# Patient Record
Sex: Female | Born: 1985 | Race: Black or African American | Hispanic: No | Marital: Single | Smoking: Light tobacco smoker
Health system: Southern US, Community
[De-identification: ages and names within clinical notes are randomized; demographics above are authoritative.]

## PROBLEM LIST (undated history)

## (undated) DIAGNOSIS — G4730 Sleep apnea, unspecified: Secondary | ICD-10-CM

## (undated) DIAGNOSIS — Z8614 Personal history of Methicillin resistant Staphylococcus aureus infection: Secondary | ICD-10-CM

## (undated) DIAGNOSIS — K219 Gastro-esophageal reflux disease without esophagitis: Secondary | ICD-10-CM

## (undated) DIAGNOSIS — N289 Disorder of kidney and ureter, unspecified: Secondary | ICD-10-CM

## (undated) DIAGNOSIS — I499 Cardiac arrhythmia, unspecified: Secondary | ICD-10-CM

## (undated) DIAGNOSIS — R569 Unspecified convulsions: Secondary | ICD-10-CM

## (undated) DIAGNOSIS — F419 Anxiety disorder, unspecified: Secondary | ICD-10-CM

## (undated) DIAGNOSIS — D649 Anemia, unspecified: Secondary | ICD-10-CM

## (undated) DIAGNOSIS — Z9884 Bariatric surgery status: Secondary | ICD-10-CM

## (undated) DIAGNOSIS — I10 Essential (primary) hypertension: Secondary | ICD-10-CM

## (undated) HISTORY — PX: EYE SURGERY: SHX253

## (undated) HISTORY — PX: INTRAUTERINE DEVICE (IUD) INSERTION: SHX5877

## (undated) HISTORY — DX: Cardiac arrhythmia, unspecified: I49.9

## (undated) HISTORY — PX: BACK SURGERY: SHX140

## (undated) HISTORY — DX: Morbid (severe) obesity due to excess calories: E66.01

## (undated) HISTORY — PX: TOE AMPUTATION: SHX809

## (undated) SURGERY — Surgical Case
Anesthesia: *Unknown

---
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
## 2006-01-31 HISTORY — PX: EYE SURGERY: SHX253

## 2010-05-08 ENCOUNTER — Emergency Department (HOSPITAL_COMMUNITY)
Admission: EM | Admit: 2010-05-08 | Discharge: 2010-05-09 | Disposition: A | Discharge status: Home / Self Care | Payer: No Typology Code available for payment source | Attending: Emergency Medicine | Admitting: Emergency Medicine

## 2010-05-08 DIAGNOSIS — M549 Dorsalgia, unspecified: Secondary | ICD-10-CM | POA: Insufficient documentation

## 2010-05-08 DIAGNOSIS — E119 Type 2 diabetes mellitus without complications: Secondary | ICD-10-CM | POA: Insufficient documentation

## 2010-05-08 DIAGNOSIS — I10 Essential (primary) hypertension: Secondary | ICD-10-CM | POA: Insufficient documentation

## 2010-08-16 ENCOUNTER — Emergency Department (HOSPITAL_COMMUNITY): Payer: Medicare Other

## 2010-08-16 ENCOUNTER — Other Ambulatory Visit (HOSPITAL_COMMUNITY): Payer: No Typology Code available for payment source

## 2010-08-16 ENCOUNTER — Inpatient Hospital Stay (HOSPITAL_COMMUNITY): Payer: Medicare Other

## 2010-08-16 ENCOUNTER — Encounter (HOSPITAL_COMMUNITY): Payer: Self-pay

## 2010-08-16 ENCOUNTER — Inpatient Hospital Stay (HOSPITAL_COMMUNITY)
Admission: EM | Admit: 2010-08-16 | Discharge: 2010-08-26 | DRG: 673 | Disposition: A | Discharge status: Home / Self Care | Payer: Medicare Other | Attending: Internal Medicine | Admitting: Internal Medicine

## 2010-08-16 DIAGNOSIS — N2581 Secondary hyperparathyroidism of renal origin: Secondary | ICD-10-CM | POA: Diagnosis present

## 2010-08-16 DIAGNOSIS — L97509 Non-pressure chronic ulcer of other part of unspecified foot with unspecified severity: Secondary | ICD-10-CM | POA: Diagnosis present

## 2010-08-16 DIAGNOSIS — Z6841 Body Mass Index (BMI) 40.0 and over, adult: Secondary | ICD-10-CM

## 2010-08-16 DIAGNOSIS — E1165 Type 2 diabetes mellitus with hyperglycemia: Secondary | ICD-10-CM | POA: Diagnosis present

## 2010-08-16 DIAGNOSIS — D631 Anemia in chronic kidney disease: Secondary | ICD-10-CM | POA: Diagnosis present

## 2010-08-16 DIAGNOSIS — F17200 Nicotine dependence, unspecified, uncomplicated: Secondary | ICD-10-CM | POA: Diagnosis present

## 2010-08-16 DIAGNOSIS — N186 End stage renal disease: Secondary | ICD-10-CM | POA: Diagnosis present

## 2010-08-16 DIAGNOSIS — Z91199 Patient's noncompliance with other medical treatment and regimen due to unspecified reason: Secondary | ICD-10-CM

## 2010-08-16 DIAGNOSIS — I674 Hypertensive encephalopathy: Secondary | ICD-10-CM | POA: Diagnosis present

## 2010-08-16 DIAGNOSIS — I120 Hypertensive chronic kidney disease with stage 5 chronic kidney disease or end stage renal disease: Principal | ICD-10-CM | POA: Diagnosis present

## 2010-08-16 DIAGNOSIS — E875 Hyperkalemia: Secondary | ICD-10-CM | POA: Diagnosis present

## 2010-08-16 DIAGNOSIS — Z9119 Patient's noncompliance with other medical treatment and regimen: Secondary | ICD-10-CM

## 2010-08-16 DIAGNOSIS — N179 Acute kidney failure, unspecified: Secondary | ICD-10-CM | POA: Diagnosis present

## 2010-08-16 HISTORY — DX: Essential (primary) hypertension: I10

## 2010-08-16 HISTORY — DX: Disorder of kidney and ureter, unspecified: N28.9

## 2010-08-16 LAB — COMPREHENSIVE METABOLIC PANEL
ALT: 7 U/L (ref 0–35)
AST: 18 U/L (ref 0–37)
Albumin: 2.5 g/dL — ABNORMAL LOW (ref 3.5–5.2)
CO2: 19 mEq/L (ref 19–32)
Calcium: 8.5 mg/dL (ref 8.4–10.5)
Chloride: 102 mEq/L (ref 96–112)
GFR calc non Af Amer: 4 mL/min — ABNORMAL LOW (ref >60)
Sodium: 135 mEq/L (ref 135–145)

## 2010-08-16 LAB — CBC
HCT: 27.9 % — ABNORMAL LOW (ref 36.0–46.0)
MCH: 25.5 pg — ABNORMAL LOW (ref 26.0–34.0)
MCHC: 33 g/dL (ref 30.0–36.0)
MCV: 77.3 fL — ABNORMAL LOW (ref 78.0–100.0)
RDW: 15.8 % — ABNORMAL HIGH (ref 11.5–15.5)

## 2010-08-16 LAB — PHOSPHORUS: Phosphorus: 9.9 mg/dL — ABNORMAL HIGH (ref 2.3–4.6)

## 2010-08-16 LAB — GLUCOSE, CAPILLARY: Glucose-Capillary: 105 mg/dL — ABNORMAL HIGH (ref 70–99)

## 2010-08-16 LAB — URINE MICROSCOPIC-ADD ON

## 2010-08-16 LAB — URINALYSIS, ROUTINE W REFLEX MICROSCOPIC
Bilirubin Urine: NEGATIVE
Protein, ur: >300 mg/dL — AB
Urobilinogen, UA: 0.2 mg/dL (ref 0.0–1.0)

## 2010-08-16 LAB — DIFFERENTIAL
Eosinophils Relative: 11 % — ABNORMAL HIGH (ref 0–5)
Lymphocytes Relative: 15 % (ref 12–46)
Lymphs Abs: 1.5 10*3/uL (ref 0.7–4.0)
Monocytes Absolute: 0.6 10*3/uL (ref 0.1–1.0)

## 2010-08-16 LAB — CARDIAC PANEL(CRET KIN+CKTOT+MB+TROPI)
CK, MB: 9.6 ng/mL (ref 0.3–4.0)
Total CK: 286 U/L — ABNORMAL HIGH (ref 7–177)

## 2010-08-17 ENCOUNTER — Inpatient Hospital Stay (HOSPITAL_COMMUNITY): Payer: Medicare Other

## 2010-08-17 DIAGNOSIS — Z01810 Encounter for preprocedural cardiovascular examination: Secondary | ICD-10-CM

## 2010-08-17 DIAGNOSIS — N19 Unspecified kidney failure: Secondary | ICD-10-CM

## 2010-08-17 LAB — PLATELET FUNCTION ASSAY
Collagen / ADP: 211 seconds (ref 0–108)
Collagen / Epinephrine: >300 seconds (ref 0–197)

## 2010-08-17 LAB — HEMOGLOBIN A1C: Mean Plasma Glucose: 134 mg/dL — ABNORMAL HIGH (ref <117)

## 2010-08-17 LAB — IRON AND TIBC
Saturation Ratios: 20 % (ref 20–55)
UIBC: 178 ug/dL

## 2010-08-17 LAB — BASIC METABOLIC PANEL
BUN: 73 mg/dL — ABNORMAL HIGH (ref 6–23)
CO2: 17 mEq/L — ABNORMAL LOW (ref 19–32)
Chloride: 103 mEq/L (ref 96–112)
Creatinine, Ser: 12.55 mg/dL — ABNORMAL HIGH (ref 0.50–1.10)
Glucose, Bld: 118 mg/dL — ABNORMAL HIGH (ref 70–99)

## 2010-08-17 LAB — URINALYSIS, MICROSCOPIC ONLY
Bilirubin Urine: NEGATIVE
Glucose, UA: 100 mg/dL — AB
Ketones, ur: NEGATIVE mg/dL
Nitrite: NEGATIVE
Specific Gravity, Urine: 1.016 (ref 1.005–1.030)
pH: 6 (ref 5.0–8.0)

## 2010-08-17 LAB — MRSA PCR SCREENING: MRSA by PCR: NEGATIVE

## 2010-08-17 LAB — RAPID URINE DRUG SCREEN, HOSP PERFORMED
Barbiturates: NOT DETECTED
Cocaine: NOT DETECTED
Tetrahydrocannabinol: NOT DETECTED

## 2010-08-17 LAB — LIPID PANEL
LDL Cholesterol: 78 mg/dL (ref 0–99)
Total CHOL/HDL Ratio: 3.6 RATIO
VLDL: 18 mg/dL (ref 0–40)

## 2010-08-17 LAB — CBC
HCT: 27.1 % — ABNORMAL LOW (ref 36.0–46.0)
Hemoglobin: 9 g/dL — ABNORMAL LOW (ref 12.0–15.0)
MCV: 77.4 fL — ABNORMAL LOW (ref 78.0–100.0)
RBC: 3.5 MIL/uL — ABNORMAL LOW (ref 3.87–5.11)
RDW: 15.7 % — ABNORMAL HIGH (ref 11.5–15.5)
WBC: 8.9 10*3/uL (ref 4.0–10.5)

## 2010-08-17 LAB — APTT: aPTT: 29 seconds (ref 24–37)

## 2010-08-17 LAB — PROTIME-INR
INR: 1.25 (ref 0.00–1.49)
Prothrombin Time: 16 seconds — ABNORMAL HIGH (ref 11.6–15.2)

## 2010-08-17 LAB — CARDIAC PANEL(CRET KIN+CKTOT+MB+TROPI)
Relative Index: 3.6 — ABNORMAL HIGH (ref 0.0–2.5)
Total CK: 264 U/L — ABNORMAL HIGH (ref 7–177)
Total CK: 220 U/L — ABNORMAL HIGH (ref 7–177)

## 2010-08-17 LAB — TSH: TSH: 4.62 u[IU]/mL — ABNORMAL HIGH (ref 0.350–4.500)

## 2010-08-17 LAB — FERRITIN: Ferritin: 40 ng/mL (ref 10–291)

## 2010-08-17 LAB — GLUCOSE, CAPILLARY
Glucose-Capillary: 149 mg/dL — ABNORMAL HIGH (ref 70–99)
Glucose-Capillary: 88 mg/dL (ref 70–99)

## 2010-08-17 LAB — HEPATITIS B SURFACE ANTIGEN: Hepatitis B Surface Ag: NEGATIVE

## 2010-08-17 LAB — PROTEIN / CREATININE RATIO, URINE: Protein Creatinine Ratio: 6.56 — ABNORMAL HIGH (ref 0.00–0.15)

## 2010-08-18 ENCOUNTER — Inpatient Hospital Stay (HOSPITAL_COMMUNITY): Payer: Medicare Other

## 2010-08-18 LAB — BASIC METABOLIC PANEL
BUN: 58 mg/dL — ABNORMAL HIGH (ref 6–23)
CO2: 20 mEq/L (ref 19–32)
Calcium: 7.8 mg/dL — ABNORMAL LOW (ref 8.4–10.5)
Chloride: 104 mEq/L (ref 96–112)
Creatinine, Ser: 11.07 mg/dL — ABNORMAL HIGH (ref 0.50–1.10)

## 2010-08-18 LAB — GLUCOSE, CAPILLARY: Glucose-Capillary: 139 mg/dL — ABNORMAL HIGH (ref 70–99)

## 2010-08-18 LAB — PTH, INTACT AND CALCIUM
Calcium, Total (PTH): 8.2 mg/dL — ABNORMAL LOW (ref 8.4–10.5)
PTH: 293.6 pg/mL — ABNORMAL HIGH (ref 14.0–72.0)

## 2010-08-18 LAB — CBC
HCT: 25 % — ABNORMAL LOW (ref 36.0–46.0)
MCH: 25.6 pg — ABNORMAL LOW (ref 26.0–34.0)
MCV: 78.1 fL (ref 78.0–100.0)
RBC: 3.2 MIL/uL — ABNORMAL LOW (ref 3.87–5.11)
RDW: 16 % — ABNORMAL HIGH (ref 11.5–15.5)
WBC: 9.2 10*3/uL (ref 4.0–10.5)

## 2010-08-18 LAB — HIV-1 RNA QUANT-NO REFLEX-BLD
HIV 1 RNA Quant: <20 copies/mL (ref <20)
HIV-1 RNA Quant, Log: <1.3 {Log} (ref <1.30)

## 2010-08-19 ENCOUNTER — Inpatient Hospital Stay (HOSPITAL_COMMUNITY): Payer: Medicare Other

## 2010-08-19 LAB — GLUCOSE, CAPILLARY
Glucose-Capillary: 119 mg/dL — ABNORMAL HIGH (ref 70–99)
Glucose-Capillary: 92 mg/dL (ref 70–99)

## 2010-08-19 LAB — BASIC METABOLIC PANEL
Calcium: 7.8 mg/dL — ABNORMAL LOW (ref 8.4–10.5)
GFR calc non Af Amer: 5 mL/min — ABNORMAL LOW (ref >60)
Glucose, Bld: 119 mg/dL — ABNORMAL HIGH (ref 70–99)
Sodium: 139 mEq/L (ref 135–145)

## 2010-08-19 LAB — CBC
HCT: 22.9 % — ABNORMAL LOW (ref 36.0–46.0)
MCHC: 32.8 g/dL (ref 30.0–36.0)
RDW: 16.5 % — ABNORMAL HIGH (ref 11.5–15.5)

## 2010-08-20 ENCOUNTER — Other Ambulatory Visit: Payer: Self-pay | Admitting: Interventional Radiology

## 2010-08-20 ENCOUNTER — Inpatient Hospital Stay (HOSPITAL_COMMUNITY): Payer: Medicare Other

## 2010-08-20 LAB — CBC
MCH: 26.3 pg (ref 26.0–34.0)
MCHC: 32.9 g/dL (ref 30.0–36.0)
Platelets: 159 10*3/uL (ref 150–400)
RBC: 3.16 MIL/uL — ABNORMAL LOW (ref 3.87–5.11)
RDW: 16.7 % — ABNORMAL HIGH (ref 11.5–15.5)

## 2010-08-20 LAB — GLUCOSE, CAPILLARY
Glucose-Capillary: 120 mg/dL — ABNORMAL HIGH (ref 70–99)
Glucose-Capillary: 106 mg/dL — ABNORMAL HIGH (ref 70–99)
Glucose-Capillary: 140 mg/dL — ABNORMAL HIGH (ref 70–99)

## 2010-08-20 LAB — COMPREHENSIVE METABOLIC PANEL
AST: 12 U/L (ref 0–37)
Albumin: 2.2 g/dL — ABNORMAL LOW (ref 3.5–5.2)
Calcium: 8.1 mg/dL — ABNORMAL LOW (ref 8.4–10.5)
Creatinine, Ser: 8.09 mg/dL — ABNORMAL HIGH (ref 0.50–1.10)

## 2010-08-20 LAB — ABO/RH: ABO/RH(D): O POS

## 2010-08-20 LAB — GLOMERULAR BASEMENT MEMBRANE ANTIBODIES

## 2010-08-20 LAB — PROTIME-INR
INR: 1.22 (ref 0.00–1.49)
Prothrombin Time: 15.7 seconds — ABNORMAL HIGH (ref 11.6–15.2)

## 2010-08-20 LAB — TYPE AND SCREEN
ABO/RH(D): O POS
Antibody Screen: NEGATIVE

## 2010-08-21 LAB — RENAL FUNCTION PANEL
Albumin: 2.2 g/dL — ABNORMAL LOW (ref 3.5–5.2)
BUN: 34 mg/dL — ABNORMAL HIGH (ref 6–23)
Chloride: 103 mEq/L (ref 96–112)
GFR calc Af Amer: 6 mL/min — ABNORMAL LOW (ref >60)
Potassium: 4.6 mEq/L (ref 3.5–5.1)
Sodium: 137 mEq/L (ref 135–145)

## 2010-08-21 LAB — CBC
HCT: 25 % — ABNORMAL LOW (ref 36.0–46.0)
Hemoglobin: 8.1 g/dL — ABNORMAL LOW (ref 12.0–15.0)
RDW: 17 % — ABNORMAL HIGH (ref 11.5–15.5)
WBC: 12.1 10*3/uL — ABNORMAL HIGH (ref 4.0–10.5)

## 2010-08-21 LAB — GLUCOSE, CAPILLARY: Glucose-Capillary: 101 mg/dL — ABNORMAL HIGH (ref 70–99)

## 2010-08-22 ENCOUNTER — Inpatient Hospital Stay (HOSPITAL_COMMUNITY): Payer: Medicare Other

## 2010-08-22 LAB — RENAL FUNCTION PANEL
BUN: 37 mg/dL — ABNORMAL HIGH (ref 6–23)
CO2: 23 mEq/L (ref 19–32)
Calcium: 8.7 mg/dL (ref 8.4–10.5)
Chloride: 99 mEq/L (ref 96–112)
Creatinine, Ser: 10.29 mg/dL — ABNORMAL HIGH (ref 0.50–1.10)
Glucose, Bld: 117 mg/dL — ABNORMAL HIGH (ref 70–99)

## 2010-08-22 LAB — PLATELET ANTIBODIES, DIRECT: Platelet IgG Ab, Direct: NEGATIVE

## 2010-08-22 LAB — CBC
HCT: 24.1 % — ABNORMAL LOW (ref 36.0–46.0)
MCH: 25.9 pg — ABNORMAL LOW (ref 26.0–34.0)
MCV: 81.1 fL (ref 78.0–100.0)
RBC: 2.97 MIL/uL — ABNORMAL LOW (ref 3.87–5.11)
WBC: 14.5 10*3/uL — ABNORMAL HIGH (ref 4.0–10.5)

## 2010-08-23 ENCOUNTER — Inpatient Hospital Stay (HOSPITAL_COMMUNITY): Payer: Medicare Other

## 2010-08-23 LAB — RENAL FUNCTION PANEL
CO2: 27 mEq/L (ref 19–32)
Calcium: 8.4 mg/dL (ref 8.4–10.5)
GFR calc Af Amer: 8 mL/min — ABNORMAL LOW (ref >60)
GFR calc non Af Amer: 7 mL/min — ABNORMAL LOW (ref >60)
Potassium: 4.4 mEq/L (ref 3.5–5.1)
Sodium: 136 mEq/L (ref 135–145)

## 2010-08-23 LAB — CBC
MCH: 26.3 pg (ref 26.0–34.0)
Platelets: 165 10*3/uL (ref 150–400)
RBC: 2.97 MIL/uL — ABNORMAL LOW (ref 3.87–5.11)
WBC: 11.1 10*3/uL — ABNORMAL HIGH (ref 4.0–10.5)

## 2010-08-23 LAB — GLUCOSE, CAPILLARY
Glucose-Capillary: 129 mg/dL — ABNORMAL HIGH (ref 70–99)
Glucose-Capillary: 103 mg/dL — ABNORMAL HIGH (ref 70–99)

## 2010-08-23 LAB — ANTI-NEUTROPHIL ANTIBODY

## 2010-08-23 NOTE — Consult Note (Signed)
[name], [name]             ACCOUNT NO.:  [id number]  MEDICAL RECORD NO.:  [id number]  LOCATION:  [id number]                         FACILITY:  [facility]  PHYSICIAN:  Maudie Flakes. Hassell Done, M.D.   DATE OF BIRTH:  08-07-1985  DATE OF CONSULTATION: DATE OF DISCHARGE:                                CONSULTATION   PRIMARY CARE PHYSICIAN:  None, was seen at [facility] in the past.  CHIEF COMPLAINT:  Severe headache.  HISTORY OF PRESENT ILLNESS:  This is a 25 year old black woman with  past medical history of diabetes, hypertension, and asthma who presents with sharp pain behind her right eye.  The patient has not seen a PCP in over a year and does not take any meds.  She has been seen at [facility] for DM, high BP, and eye surgery and was started on insulin and hypertensive meds.  The patient stopped taking them over a year ago. She says she could not afford it.  Headache started last night around 10 p.m. She was sitting down when she felt "hot" sensation in both eyes and then her headache started suddenly.  The headache is located at the right forehead and behind right eye, radiates to the right side of her neck. Current pain is 9/10.  Associated symptoms of neck pain with range of motion, blurry vision for the past 3-4 weeks, nausea, vomiting x3 months, and shortness of breath with exertion.  She denies chest pain, abdominal pain, dysuria, numbness, tingling of extremities.  ED COURSE:  She received Zofran IV, morphine 4 mg x2, metoprolol 5 mg, and labetalol 10 IV.  PAST MEDICAL HISTORY: 1. Diabetes type 2. 2. Hypertension. 3. Irregular heart beat. 4. Asthma.  PAST SURGICAL HISTORY:  Laser photocoagulation plus questionable vitrectomy.  ALLERGIES:  No known drug allergies, LASIX.  MEDICATIONS:  None currently.  SOCIAL HISTORY:  She lives in apartment with 2 roommates.  Occupation: on disability.  She is a Programmer, systems.  She smokes about a half- a-pack per day  for over 10 years.  Denies any alcohol or illicit drug use. She thinks she'll live with her parents in [place] on d/c from hospital  FAMILY HISTORY:  Mother has a diabetes, hypertension, and thyroid disease.  Father has thyroid disease and hypertension. Siblings; brother has hypertension, sister is healthy.  REVIEW OF SYSTEMS:  Positive for headache, edema, cough, dyspnea, nausea, vomiting, diarrhea, and visual changes.  Negative for fever, chills, sweats, chest pain, edema, abdominal pain, polyuria, dysuria.  PHYSICAL EXAMINATION:  VITAL SIGNS:  Temperature 98, pulse 82, respirations 20, blood pressure 205/142, pulse ox 100% on room air, weight 129 kilos. GENERAL:  Pleasant, flat affect, in no acute distress. HEENT:  /AT.  Extraocular muscles intact.  Eye exam deferred, will return with ophthalmoscope in the morning. NECK:  Full range of motion.  No JVD or thyromegaly, tenderness on palpation of paraspinal muscles. CARDIOVASCULAR:  Regular rate and rhythm.  No murmur appreciated. LUNGS:  Clear to auscultation bilaterally.  No wheezes, rales, or rhonchi. ABDOMEN:  Soft, obese, nontender, normoactive bowel sounds. EXTREMITIES:  3+ nonpitting pedal edema.  Left big toe diabetic ulcer. NEUROLOGIC:  Alert, awake, oriented  x3.  No focal neuro deficits. MUSCULOSKELETAL:  2+ intact pulses distal bilaterally.  LABS AND STUDIES:  CBC; white count 10.3, hemoglobin 9.2, hematocrit 27.9, platelets 268, neutrophils 69%.  CMET; sodium 135, potassium 5.2, chloride 102, CO2 19, BUN 70, creatinine 12.02, glucose 112.  AST 18, ALT 7, total bili 0.2, calcium 8.5, albumin 2.5.  Urinalysis, specific gravity 1.016, urine glucose 100, large blood, protein over 300, negative nitrites, small leukocytes.  Urine micro, few epi, granular casts, white blood cells 3-6, red blood cells 11-20, few bacteria. Urine pregnancy negative.    CT head negative.  ASSESSMENT AND PLAN:  This is a 25 year old black  woman who hasn't taken meds for DM and high BP in > 81yr.  She is admitted for headache and elevated blood pressure, now found to have acute kidney injury with a creatinine of 12.0.  1. Renal acute kidney injury, likely secondary to uncontrolled     diabetes and hypertension.  The patient may also be volume depleated due     vomiting.  We will get a renal ultrasound to rule     out hydro/obstruction.  Urine drug screen,       ANA, C3, C4, PTH, urine protein/creatinine ratio,     and HIV reflex.  We will also get a hepatitis B surface     antigen for possible dialysis.[name] Kitchen 2. Cardiovascular.  Blood pressure currently 205/142.  We will     continue with labetalol 10 mg every 2 hours of parameters.  Then     blood pressure better controlled, we may switch to amlodipine. 3. Diabetes type 2.  Blood glucose currently 112.  The patient may     need sliding scale insulin if CBGs trend up.  We will defer to     primary team. 4. Fluids, electrolytes, and nutrition/gastrointestinal.  Start a carb-     modified, low-sodium, low phosphorus, low poatssium diet. 5. Social.  Primary team is to get social work consult regarding     her financial assistance for meds and to establish PCP. 6. Anemia.  Hemoglobin is 9.2, likely secondary to chronic kidney     disease.  We will get iron studies and consider adding Feraheme or     Aranesp.    ______________________________ Donnamarie Rossetti, MD   ______________________________ Maudie Flakes. Hassell Done, M.D.    ID/MEDQ  D:  08/18/2010  T:  08/19/2010  Job:  [id number]  Electronically Signed by Donnamarie Rossetti MD on 08/22/2010 09:41:18 AM Electronically Signed by Salem Senate M.D. on 08/23/2010 11:57:10 AM

## 2010-08-24 ENCOUNTER — Inpatient Hospital Stay (HOSPITAL_COMMUNITY): Payer: Medicare Other

## 2010-08-24 DIAGNOSIS — I120 Hypertensive chronic kidney disease with stage 5 chronic kidney disease or end stage renal disease: Secondary | ICD-10-CM

## 2010-08-24 DIAGNOSIS — N186 End stage renal disease: Secondary | ICD-10-CM

## 2010-08-24 LAB — RENAL FUNCTION PANEL
CO2: 32 mEq/L (ref 19–32)
GFR calc Af Amer: 12 mL/min — ABNORMAL LOW (ref >60)
Glucose, Bld: 97 mg/dL (ref 70–99)
Potassium: 4.3 mEq/L (ref 3.5–5.1)
Sodium: 138 mEq/L (ref 135–145)

## 2010-08-24 LAB — PTH, INTACT AND CALCIUM
Calcium, Total (PTH): 8.1 mg/dL — ABNORMAL LOW (ref 8.4–10.5)
PTH: 245.2 pg/mL — ABNORMAL HIGH (ref 14.0–72.0)

## 2010-08-24 LAB — CBC
Hemoglobin: 8.3 g/dL — ABNORMAL LOW (ref 12.0–15.0)
MCH: 25.6 pg — ABNORMAL LOW (ref 26.0–34.0)
RBC: 3.24 MIL/uL — ABNORMAL LOW (ref 3.87–5.11)

## 2010-08-24 LAB — GLUCOSE, CAPILLARY
Glucose-Capillary: 107 mg/dL — ABNORMAL HIGH (ref 70–99)
Glucose-Capillary: 99 mg/dL (ref 70–99)

## 2010-08-25 DIAGNOSIS — N186 End stage renal disease: Secondary | ICD-10-CM

## 2010-08-25 DIAGNOSIS — I120 Hypertensive chronic kidney disease with stage 5 chronic kidney disease or end stage renal disease: Secondary | ICD-10-CM

## 2010-08-25 LAB — GLUCOSE, CAPILLARY: Glucose-Capillary: 113 mg/dL — ABNORMAL HIGH (ref 70–99)

## 2010-08-26 ENCOUNTER — Inpatient Hospital Stay (HOSPITAL_COMMUNITY): Payer: Medicare Other

## 2010-08-26 DIAGNOSIS — N186 End stage renal disease: Secondary | ICD-10-CM

## 2010-08-26 DIAGNOSIS — I120 Hypertensive chronic kidney disease with stage 5 chronic kidney disease or end stage renal disease: Secondary | ICD-10-CM

## 2010-08-26 HISTORY — PX: AV FISTULA PLACEMENT: SHX1204

## 2010-08-26 LAB — BASIC METABOLIC PANEL
GFR calc Af Amer: 8 mL/min — ABNORMAL LOW (ref >60)
GFR calc non Af Amer: 7 mL/min — ABNORMAL LOW (ref >60)
Potassium: 3.9 mEq/L (ref 3.5–5.1)
Sodium: 135 mEq/L (ref 135–145)

## 2010-08-26 LAB — SURGICAL PCR SCREEN
MRSA, PCR: NEGATIVE
Staphylococcus aureus: NEGATIVE

## 2010-08-26 LAB — CBC
Platelets: 185 10*3/uL (ref 150–400)
RBC: 3.25 MIL/uL — ABNORMAL LOW (ref 3.87–5.11)
WBC: 10.9 10*3/uL — ABNORMAL HIGH (ref 4.0–10.5)

## 2010-08-26 LAB — GLUCOSE, CAPILLARY
Glucose-Capillary: 121 mg/dL — ABNORMAL HIGH (ref 70–99)
Glucose-Capillary: 116 mg/dL — ABNORMAL HIGH (ref 70–99)

## 2010-08-27 NOTE — Discharge Summary (Signed)
[name], [name]             ACCOUNT NO.:  [id number]  MEDICAL RECORD NO.:  [id number]  LOCATION:                                 FACILITY:  PHYSICIAN:  Jacolyn Reedy, M.D.   DATE OF BIRTH:  [birth year]  DATE OF ADMISSION:  08/16/2010 DATE OF DISCHARGE:  08/26/2010                              DISCHARGE SUMMARY   ADDENDUM  DISCHARGE DIAGNOSES: 1. End-stage renal disease on hemodialysis. 2. Hypertensive emergency, resolved, now with labile blood pressures. 3. Type 2 diabetes on very low dose of 70/30. 4. Morbid obesity. 5. Anemia of chronic disease. 6. Secondary hyperparathyroidism. 7. Remote history of asthma.  DISCHARGE MEDICATIONS: 1. Amlodipine 10 mg by mouth once a day. 2. Aspirin 81 mg by mouth once a day. 3. Aranesp 200 mcg on Thursdays at dialysis. 4. Ferrous gluconate 125 mg Tuesdays, Thursdays, and Saturdays at     dialysis. 5. NovoLog 70/30 four units subcu with dinner, but hold if the sugar     is less than 120. 6. NovoLog 70/30 six units with breakfast, but hold if the sugar is     less than 120. 7. Labetalol 200 mg by mouth twice a day. 8. Zemplar 3 mcg IV Tuesdays, Thursdays, and Fridays with dialysis. 9. Promethazine 12.5 mg every 6 hours as needed for nausea. 10.Nephro-Vite 1 tablet by mouth at bedtime. 11.Renvela 1600 mg by mouth 3 times a day with meals.  HOSPITAL COURSE: 1. For details of the hospitalization up to August 24, 2010, please     refer to dictation by Domenic Polite, but after August 24, 2010, she     has had a long-term access placed by Vascular Surgery.  She is     going to have dialysis on Tuesdays, Thursdays, and Saturdays.  Her     blood pressures remained adequately controlled on 10 mg of     amlodipine and 200 mg b.i.d. of labetalol.  She has had lows in the     low 120s, but highs that go up to 190.  However, with dialysis,     hopefully, this should continue to improve, so again we will need     to follow this up at [facility]. 2. End-stage renal disease, now she is getting dialyzed Tuesdays,     Thursdays, and Saturdays.  Renal assistance during this     hospitalization was greatly appreciated. 3. Diabetes.  Her hemoglobin A1c is 6.7.  She is refusing to take     pills.  We will put her on a very low dose of 70/30 with generous     hold criteria for sugar less than 120, she should take it and she     will need to follow up with [facility] for this.  She has refused     to take any oral medications and again, she will need to take     metformin because of her kidney disease. 4. Anemia of chronic disease.  We will continue with IV iron at     dialysis.  DISCHARGE LABS AND VITALS:  Temperature 98.3, heart rate 95, respiratory rate 20, blood pressure 143/75.  Sodium 135, potassium 3.9, chloride  98, bicarb 26, BUN 16, creatinine 7, glucose 101, calcium 8.8.  MRSA screen negative.  White count 11, hemoglobin 8.3, hematocrit 27, platelet 295.  Approximate length of this discharge was approximately 30 minutes.     Jacolyn Reedy, M.D.     JC/MEDQ  D:  08/26/2010  T:  08/26/2010  Job:  [id number]  cc:   [facility]  Electronically Signed by Jacolyn Reedy M.D. on 08/27/2010 01:58:57 PM

## 2010-08-27 NOTE — Op Note (Signed)
  [name], [name]             ACCOUNT NO.:  [id number]  MEDICAL RECORD NO.:  [id number]  LOCATION:  6706                         FACILITY:  [place]  PHYSICIAN:  Judeth Cornfield. Scot Dock, M.D.DATE OF BIRTH:  05/16/1985  DATE OF PROCEDURE: DATE OF DISCHARGE:  08/26/2010                              OPERATIVE REPORT   PREOPERATIVE DIAGNOSIS:  Chronic kidney disease.  POSTOPERATIVE DIAGNOSIS:  Chronic kidney disease.  PROCEDURE:  Placement of a left radiocephalic AV fistula.  SURGEON:  Judeth Cornfield. Scot Dock, MD  ASSISTANT:  Evorn Gong, PA  ANESTHESIA:  General.  TECHNIQUE:  The patient was taken to the operating room and received a general anesthetic.  The left upper extremity was prepped and draped in usual sterile fashion.  An oblique incision was made in the left wrist and the cephalic vein was dissected free and ligated distally and irrigated up with heparinized saline, it was about a 3.5-mm vein.  The radial artery was dissected free beneath the fascia.  The artery was small.  The patient was heparinized.  The artery was clamped proximally and distally and a longitudinal arteriotomy was made.  The vein was mobilized over, spatulated and sewn end-to-side to the artery using continuous 6-0 Prolene suture.  At the completion, there was a good thrill in the fistula and a good radial and ulnar signal with the Doppler.  Hemostasis was obtained in the wound.  The heparin was partially reversed with protamine.  The wound was closed with a deep layer of 3-0 Vicryl and the skin closed with 4-0 Vicryl.  Sterile dressing was applied.  The patient tolerated the procedure well, was transferred to the recovery room in stable condition.  All needle and sponge counts were correct.     Judeth Cornfield. Scot Dock, M.D.     CSD/MEDQ  D:  08/26/2010  T:  08/26/2010  Job:  [id number]  Electronically Signed by Deitra Mayo M.D. on 08/27/2010 03:45:56 PM

## 2010-09-02 NOTE — Discharge Summary (Signed)
  NAMESKYLEA, [name]             ACCOUNT NO.:  [id number]  MEDICAL RECORD NO.:  [id number]  LOCATION:                                 FACILITY:  PHYSICIAN:  Domenic Polite, MD     DATE OF BIRTH:  November 16, 1985  DATE OF ADMISSION:  08/16/2010 DATE OF DISCHARGE:  08/26/2010                              DISCHARGE SUMMARY   PRIMARY CARE PHYSICIAN:  None.  She is unassigned.  RENAL:  [employer].  DISCHARGE DIAGNOSES: 1. Acute renal failure, now end-stage renal disease, on hemodialysis. 2. Hypertensive emergency, improved. 3. Type 2 diabetes. 4. Morbid obesity. 5. Anemia of chronic disease. 6. Secondary hyperparathyroidism. 7. History of asthma.  Discharge medications and followup to be dictated at the time of actual discharge.  HOSPITAL COURSE:  [name] is a 25 year old black female with history of diabetes, obesity, and hypertension, presented to the hospital with headaches.  On evaluation, she was found to have a blood pressure of 123456 diastolic.  In addition, was found to have a creatinine on admission of 12.0.  1. Hypertensive emergency.  For this, she was treated in the ICU with     IV labetalol, subsequently was started on dialysis as well due to     renal failure and her blood pressure is currently stabilized on     current regimen of amlodipine and labetalol. 2. Acute renal failure, now end-stage renal disease, currently getting     dialyzed every other day.  Renal was following her throughout the     hospital stay.  She had kidney biopsy done on Friday.  The results     of which are pending at this point.  The etiology of her renal     failure is suspected to be secondary to longstanding uncontrolled     hypertension and diabetes.  She was followed at [facility] up     until a year ago, at which time, she was found to have CKD 3.     Unfortunately, they could have been compliant issues until she     presented to the hospital here with  severe full-blown renal failure     and hypertensive emergency.  At this point, her     Hemodialysis Center is being set up and the patient will be     discharged home when okay per Renal. 3. Rest of her chronic medical problems remained stable. 4. Anemia of chronic disease, has been getting Aranesp and IV iron     with dialysis.     Domenic Polite, MD     PJ/MEDQ  D:  08/24/2010  T:  08/24/2010  Job:  [id number]  Electronically Signed by Domenic Polite  on 09/02/2010 05:09:53 PM

## 2010-09-20 ENCOUNTER — Encounter: Payer: Self-pay | Admitting: Vascular Surgery

## 2010-09-30 NOTE — H&P (Signed)
[name], [name]             ACCOUNT NO.:  [id number]  MEDICAL RECORD NO.:  [id number]  LOCATION:  6706                         FACILITY:  [place]  PHYSICIAN:  Theotis Burrow IV, MDDATE OF BIRTH:  March 01, 1985  DATE OF ADMISSION:  08/16/2010 DATE OF DISCHARGE:  08/26/2010                             HISTORY & PHYSICAL   REASON FOR CONSULTATION:  Permanent hemodialysis access.  HISTORY OF PRESENT ILLNESS:  The patient is a 25 year old obese African American female with a past medical history significant for diabetes, hypertension, and asthma who presented on August 16, 2010 with complaints of a severe headache.  After presentation and evaluation, she was found to have an elevated blood pressure of 205/142 and a creatinine of 12. The patient has had known insulin-dependent diabetes and hypertension for quite some time and states she has not taken her medications and has been noncompliant with followup.  At the time of admission, she had also complained of blurry vision, nausea and vomiting, and shortness of breath with exertion.  The patient was subsequently admitted for control of her hypertension and acute renal insufficiency.  The renal service was consulted, and as the patient's creatinine has remained elevated, she was started on hemodialysis via a right-sided Diatek catheter that was placed by interventional radiology.  The patient is going to have a continued need for hemodialysis; therefore, a vascular surgery consult was obtained for permanent access.  The patient is right-handed.  The patient currently complains of a headache and some dizziness.  She is currently dialyzing.  She denies chest pain, shortness of breath, nausea, vomiting, diarrhea, constipation, claudication, and TIA/CVA symptoms.  PAST MEDICAL HISTORY: 1. Diabetes mellitus type 2, insulin-dependent. 2. Hypertension. 3. Irregular heartbeat. 4. Asthma. 5. History of eye surgery. 6. Acute on chronic  renal failure, now end-stage renal disease on hemodialysis 3     times per week. 7. Morbid obesity. 8. Anemia of chronic disease. 9. Secondary hyperparathyroidism.  ALLERGIES:  No known drug allergies.  PAST SURGICAL HISTORY:  Laser photocoagulation.  INPATIENT MEDICATIONS:  Sliding scale insulin. Aspirin 81 mg daily. Labetalol 200 mg p.o. b.i.d. Sevelamer 1600 mg t.i.d. NovoLog insulin 2 units subcu t.i.d. Lovenox 30 mg subcu daily. Norvasc 5 mg p.o. nightly. Renal vitamin 1 p.o. daily. Aranesp 200 mcg IV on Wednesday. Ferritin gluconate 125 mg IV Monday, Wednesday, Friday. Ferritin gluconate complex 125 mg IV Monday at hemodialysis.  SOCIAL HISTORY:  The patient states she lives with her parents.  She is single.  She has no children.  She smokes approximately 7-9 cigarettes per day and has for over 10 years.  She denies alcohol use.  FAMILY HISTORY:  Diabetes, hypertension and thyroid disease.  REVIEW OF SYSTEMS:  Please see HPI for pertinent positives and negatives, otherwise a complete review of systems is negative.  PHYSICAL EXAM:  Blood pressure 163/84, temperature 98.1, heart rate 76, O2 saturation 96% on room air. GENERAL:  This is an obese female who is on hemodialysis currently.  She states she is dizzy at this time.  She also complains of a headache. HEENT:  Normocephalic, atraumatic.  PERRL.  EOMI. NECK:  Supple with no evidence of JVD or bruit. CARDIAC:  Regular rate and rhythm. LUNGS:  Clear to auscultation with no wheezes or rhonchi noted. ABDOMEN:  Soft, nontender, nondistended with active bowel sounds. MUSCULOSKELETAL:  There are 2+ radial and ulnar pulses present in the bilateral upper extremities.  Motor and sensation are intact in the bilateral upper and lower extremities.  Lower extremities are warm and well perfused.  There is no edema noted. NEURO:  Nonfocal.  Cranial nerves II-XII appear to be intact. SKIN:  No evidence of rashes or skin  breakdown.  LABS:  CBC on August 24, 2010:  White count 7.8, hemoglobin 8.3, hematocrit 26.4, platelets 158.  BMP on August 24, 2010:  Sodium 138, potassium 4.3, BUN 11, creatinine 5.3, GFR 12.  IMAGING: 1. Renal biopsy is pending. 2. Chest x-ray on August 17, 2010 revealed cardiomegaly. 3. Renal ultrasound on August 16, 2010 revealed increased echotexture     throughout the kidneys, compatible with medical renal disease.  No     hydronephrosis. 4. A CT of the head without contrast was performed on August 16, 2010     and this was normal. 5. Vein mapping was completed on August 24, 2010.  This revealed a     thrombus-free, compressible and adequate left cephalic vein.  This     vein ranges in size from 3.2 mm at the wrist to 5.7 mm at the     shoulder.  The basilic vein was not evaluated, and the right arm     was not evaluated.  ASSESSMENT: 1. Hypertension. 2. Diabetes mellitus. 3. Anemia. 4. Secondary hyperparathyroidism. 5. End-stage renal disease on hemodialysis.  PLAN: 1. All the patient's medical issues will continued to be followed by     the internal medicine and renal services. 2. The patient will be scheduled for a left radiocephalic AV fistula     as soon as possible.  The patient will continue to dialyze via her     Diatek catheter until her fistula has matured and is usable for     hemodialysis.     Leta Baptist, PA   ______________________________ V. Leia Alf, MD    AY/MEDQ  D:  08/24/2010  T:  08/27/2010  Job:  [id number]  Electronically Signed by Leta Baptist PA on 09/01/2010 09:14:40 AM Electronically Signed by Orvan Falconer IV MD on 09/30/2010 12:02:46 AM

## 2010-10-13 ENCOUNTER — Ambulatory Visit: Payer: Medicare Other | Admitting: Vascular Surgery

## 2010-10-13 NOTE — H&P (Signed)
[name], [name]             ACCOUNT NO.:  [id number]  MEDICAL RECORD NO.:  [id number]  LOCATION:  2038                         FACILITY:  [place]  PHYSICIAN:  Domingo Mend, M.D. DATE OF BIRTH:  [birth year]  DATE OF ADMISSION:  08/16/2010 DATE OF DISCHARGE:                             HISTORY & PHYSICAL   PRIMARY CARE PHYSICIAN:  At the [facility].  CHIEF COMPLAINT:  Pain around her right eye and headache.  HISTORY OF PRESENT ILLNESS:  [name] is a 25 year old obese African American young lady, who has a history of diabetes, hypertension who presented with headache and sharp pain behind her right eye.  She has been diagnosed with diabetes since the fifth grade, however, has not seen a doctor or taken any medicines in over a year.  She notes that when her headache starts bothering her, she usually related to her blood pressure.  So she came into the emergency department for treatment where basic blood work was drawn and she was found to have a BUN of 70 and a creatinine of 12.  Her last known creatinine was 1.82 and that was done at the [facility] in June 2012 per their notes.  She was also noted to have a blood pressure 205/142 and hence she is admitted for further evaluation.  She has chills, however, no fever and is not complaining of nausea, vomiting, chest pain, shortness of breath, or any other symptoms.  ALLERGIES:  She has no known drug allergies.  PAST MEDICAL HISTORY: 1. Type 2 diabetes. 2. Hypertension. 3. Morbid obesity.  HOME MEDICATIONS:  None.  SOCIAL HISTORY:  She smokes about 7-8 cigarettes a day.  Denies any alcohol or illicit drug use.  REVIEW OF SYSTEMS:  Negative except as mentioned in history of present illness.  FAMILY HISTORY:  Significant for diabetes in both parents.  PHYSICAL EXAM:  VITAL SIGNS:  On admission blood pressure 205/142, heart rate 82, respirations 20, sats of 100% on room air, and  temperature of 98.0. GENERAL:  She is alert, awake, oriented x3, is shivering when I see her. NECK:  Supple.  No JVD, no lymphadenopathy, no bruits, no goiter. HEENT:  Normocephalic, atraumatic.  Her pupils are equally round and reactive to light with intact extraocular movements. HEART:  Regular rate and rhythm without murmurs, rubs, or gallops. LUNGS:  Clear to auscultation bilaterally. ABDOMEN:  Obese, soft, nontender, nondistended.  Positive bowel sounds. EXTREMITIES:  She has no clubbing or cyanosis.  She has about 3+ nonpitting edema.  LABORATORY DATA:  Labs on admission; sodium 135, potassium 5.2, chloride 102, bicarb 19, BUN 70, creatinine 12.02, glucose of 112, total bili is 0.2, and albumin of 2.5, otherwise LFTs are within normal limits.  WBC 10.3, hemoglobin 9.2, platelets of 268.  IMAGING:  Images performed so far include a renal ultrasound that showed increased echotexture throughout the kidneys compatible with medical renal disease, no hydronephrosis.  A CT scan of the head without contrast that shows a normal CT.  ASSESSMENT AND PLAN: 1. Acute on likely chronic kidney disease.  Her last known creatinine     was 1.82 in June of this year.  Her creatinine  now presents at 33.     Nephrology has already been consulted and they have seen her.  They     questioned whether she may have developed end-stage renal disease     secondary to her diabetes and hypertension and whether she may need     dialysis or not this admission.  Renal has ordered an extensive     workup including vasculitic and complement disorders.  They are     also ordering a vein mapping for possible fistula placement.  They     have also ordered iron studies and urine protein to evaluate for     possible nephrotic syndrome.  We will recheck her labs in the     morning.  We will also put her on some IV fluids to see if this     helps reverse some of her renal failure. 2. Accelerated hypertension with  hypertensive emergency.  Her blood     pressure is currently 205/142.  She has evidence of end-organ     damage, which would be her headache as well as her renal     dysfunction.  At this point, I believe that we need to place her on     IV drips.  I will start her on IV labetalol drip.  She will likely     need to be moved to step-down and an order for this to be     accomplished. 3. Diabetes mellitus.  I will check hemoglobin A1c.  Place her on a     resistance sliding scale insulin for now.  No basal insulin at the     moment, although I believe she will likely require some Lantus.     For prophylaxis, I will place her on Lovenox.     Domingo Mend, M.D.     EH/MEDQ  D:  08/16/2010  T:  08/16/2010  Job:  [id number]  Electronically Signed by Domingo Mend M.D. on 10/13/2010 02:22:19 PM

## 2010-10-19 ENCOUNTER — Encounter: Payer: Self-pay | Admitting: Physician Assistant

## 2010-10-20 ENCOUNTER — Ambulatory Visit (INDEPENDENT_AMBULATORY_CARE_PROVIDER_SITE_OTHER): Payer: Medicare Other | Admitting: Physician Assistant

## 2010-10-20 ENCOUNTER — Ambulatory Visit (INDEPENDENT_AMBULATORY_CARE_PROVIDER_SITE_OTHER): Payer: Medicare Other | Admitting: *Deleted

## 2010-10-20 DIAGNOSIS — Z48812 Encounter for surgical aftercare following surgery on the circulatory system: Secondary | ICD-10-CM

## 2010-10-20 DIAGNOSIS — Z992 Dependence on renal dialysis: Secondary | ICD-10-CM

## 2010-10-20 DIAGNOSIS — T82598A Other mechanical complication of other cardiac and vascular devices and implants, initial encounter: Secondary | ICD-10-CM

## 2010-10-20 DIAGNOSIS — N186 End stage renal disease: Secondary | ICD-10-CM

## 2010-10-20 NOTE — Progress Notes (Signed)
[facility]ASC[facility] OF [place]  Postoperative Access Visit  History of Present Illness  [name] is a 25 y.o. year old female who presents for postoperative follow-up for: left R-C AVF placed on 08/26/10 by Dr. Scot Dock.  The patient's wounds are healed.  The patient notes no steal symptoms.  She is able to complete their activities of daily living.  She is without complaint.  She is dialyzing TThSat via a right sided diatek without difficulty.    Physical Examination  There were no vitals filed for this visit. Left UE: Incision is well healed, skin feels warm, hand grip is 5/5, sensation in digits is intact, easily palpable thrill with several side branches that also have thrill present; 2+ left ulnar pulse  Duplex: Patent AVF with diameter ranging from 0.47cm at wrist to 0.51 at anticub fossa; depth ranging from 0.37 cm at wrist to 0.94 at anticub fossa There are several side branches that measure 0.27cm in diameter.  There is also an elevated velocity of >600 in the native radial artery proximal to the anastamosis.    Medical Decision Making  [name] is a 25 y.o. year old female who presents s/p left R-C AVF.  She has no steal sx and her fistula is developing nicely.  If her fistula does not mature enough for HD there are several side branches that may have to be ligated.  Discussed the elevated radial artery velocity with Dr. Donnetta Hutching who feels this is not compromising the AVF at this time and she is not having steal.   She will f/u in 6-8 weeks for re-evaluation of fistula maturation.  The patient's tunneled dialysis catheter can be removed after two successful cannulations and completed dialysis treatments.  Clinic MD: Early

## 2010-10-28 NOTE — Procedures (Unsigned)
VASCULAR LAB EXAM  INDICATION:  Follow up left radiocephalic fistula placed [id number].  HISTORY: Diabetes:  Yes. Cardiac:  No. Hypertension:  Yes.  EXAM: 1. Patent left radiocephalic fistula with significantly elevated     velocities in a focal segment of the inflow radial artery.  Unable     to determine stenosis versus dissection. 2. There are multiple branches originating from the fistula. 3. Please see attached diagram for details.  IMPRESSION:  Patent left radiocephalic fistula with velocities of  >600 cm/s in the native inflow radial artery.  ___________________________________________ Judeth Cornfield. Scot Dock, M.D.  LT/MEDQ  D:  10/20/2010  T:  10/20/2010  Job:  [id number]

## 2010-12-07 ENCOUNTER — Encounter: Payer: Self-pay | Admitting: Vascular Surgery

## 2010-12-08 ENCOUNTER — Ambulatory Visit: Payer: Medicare Other | Admitting: Vascular Surgery

## 2010-12-28 ENCOUNTER — Encounter: Payer: Self-pay | Admitting: Vascular Surgery

## 2010-12-29 ENCOUNTER — Encounter: Payer: Self-pay | Admitting: Vascular Surgery

## 2010-12-29 ENCOUNTER — Ambulatory Visit (INDEPENDENT_AMBULATORY_CARE_PROVIDER_SITE_OTHER): Payer: Medicare Other | Admitting: Vascular Surgery

## 2010-12-29 VITALS — BP 139/83 | HR 94 | Resp 16 | Ht 65.0 in | Wt 268.0 lb

## 2010-12-29 DIAGNOSIS — N186 End stage renal disease: Secondary | ICD-10-CM | POA: Insufficient documentation

## 2010-12-29 NOTE — Progress Notes (Signed)
Vascular and Vein Specialist of [facility]  Patient name: [name] MRN: [id number] DOB: [birth year] Sex: female  CC: Follow up of left radiocephalic AV fistula.  HPI: [name] is a 25 y.o. female who underwent placement of a left radiocephalic AV fistula on [id number]. His been slow to mature. She was evaluated in our office by a physician's assistant on 10/20/2010. At that time the fistula was not maturing adequately. Duplex scan showed multiple branches. He was set up for a follow up visit.  She dialyzes on Tuesdays Thursdays and Saturdays. She's had no recent uremic symptoms. She's had no pain associated with her fistula and no hand pain or paresthesias.  Past Medical History  Diagnosis Date  . Diabetes mellitus   . Hypertension   . Renal insufficiency   . Morbid obesity   . Asthma   . Irregular heart beat     History reviewed. No pertinent family history.  SOCIAL HISTORY: History  Substance Use Topics  . Smoking status: Current Everyday Smoker -- 0.5 packs/day for 10 years    Types: Cigarettes  . Smokeless tobacco: Not on file  . Alcohol Use: No    No Known Allergies  Current Outpatient Prescriptions  Medication Sig Dispense Refill  . amLODipine (NORVASC) 5 MG tablet Take 5 mg by mouth daily.        [name] Kitchen aspirin 81 MG tablet Take 81 mg by mouth daily.        [name] Kitchen b complex-vitamin c-folic acid (NEPHRO-VITE) 0.8 MG TABS Take 0.8 mg by mouth at bedtime.        . darbepoetin (ARANESP, ALB FREE, SURECLICK) [id number] SOLN Inject 200 mcg into the skin every 7 (seven) days. Given every Wed. @@ HD       . enoxaparin (LOVENOX) 30 MG/0.3ML SOLN Inject 30 mg into the skin daily.        . insulin aspart (NOVOLOG) 100 UNIT/ML injection Inject 2 Units into the skin 3 (three) times daily before meals.        [name] Kitchen labetalol (NORMODYNE) 200 MG tablet Take 200 mg by mouth 2 (two) times daily.        . sevelamer (RENAGEL) 800 MG tablet Take 1,600 mg by mouth 3 (three) times daily  with meals.          REVIEW OF SYSTEMS: Valu.Nieves ] denotes positive finding; [  ] denotes negative finding CARDIOVASCULAR:  [ ]  chest pain   [ ]  chest pressure   [ ]  palpitations   [ ]  orthopnea   [ ]  dyspnea on exertion   [ ]  claudication   [ ]  rest pain   [ ]  DVT   [ ]  phlebitis PULMONARY:   [ ]  productive cough   [ ]  asthma   [ ]  wheezing  PHYSICAL EXAM: Filed Vitals:   12/29/10 1312  BP: 139/83  Pulse: 94  Resp: 16  Height: 5\' 5"  (1.651 m)  Weight: 268 lb (121.564 kg)  SpO2: 99%   Body mass index is 44.60 kg/(m^2). GENERAL: The patient is a well-nourished female, in no acute distress. The vital signs are documented above. CARDIOVASCULAR: There is a regular rate and rhythm without significant murmur appreciated.  PULMONARY: There is good air exchange bilaterally without wheezing or rales. The thrill in the fistula is fairly weak. The fistula does not appear to be maturing adequately.  DATA:  I have reviewed her duplex scan which was performed at the time of her last visit. This showed an  area of increased callosity within the native radial artery above the fistula. An additional or multiple competing branches.  MEDICAL ISSUES: I have recommended that we proceed with a fistulogram in order 12 I await the proximal stenosis in the radial artery and also the competing branches. Perhaps the fistula to be revised above the area of stenosis a possible to provide better inflow. At this point I do not think simply ligating the branches would have a significant impact on the maturation of the fistula. We'll make further recommendations pending the results of her fistulogram which is scheduled for 01/10/2011.  [place] [facility] [place] Office: [phone]

## 2010-12-31 ENCOUNTER — Other Ambulatory Visit: Payer: Self-pay

## 2011-01-04 ENCOUNTER — Encounter (HOSPITAL_COMMUNITY): Payer: Self-pay | Admitting: Pharmacy Technician

## 2011-01-06 ENCOUNTER — Telehealth: Payer: Self-pay | Admitting: *Deleted

## 2011-01-06 NOTE — Telephone Encounter (Signed)
[name] from [facility] said [name] could not get a ride to [facility] and they were transferring her care to [facility]; so please cancel fistulogram scheduled for 01/10/11.

## 2011-01-10 ENCOUNTER — Encounter (HOSPITAL_COMMUNITY): Payer: Self-pay

## 2011-01-10 ENCOUNTER — Ambulatory Visit (HOSPITAL_COMMUNITY): Admit: 2011-01-10 | Payer: Self-pay | Admitting: Vascular Surgery

## 2011-01-10 SURGERY — ASSESSMENT, SHUNT FUNCTION, WITH CONTRAST RADIOGRAPHIC STUDY
Anesthesia: LOCAL | Laterality: Left

## 2011-03-01 ENCOUNTER — Ambulatory Visit: Payer: Self-pay | Admitting: Vascular Surgery

## 2011-03-01 LAB — BASIC METABOLIC PANEL
Anion Gap: 10 (ref 7–16)
BUN: 19 mg/dL — ABNORMAL HIGH (ref 7–18)
Creatinine: 4.95 mg/dL — ABNORMAL HIGH (ref 0.60–1.30)
EGFR (African American): 13 — ABNORMAL LOW
Sodium: 133 mmol/L — ABNORMAL LOW (ref 136–145)

## 2011-03-23 ENCOUNTER — Observation Stay: Payer: Self-pay | Admitting: Specialist

## 2011-03-23 LAB — COMPREHENSIVE METABOLIC PANEL
Albumin: 3.4 g/dL (ref 3.4–5.0)
Anion Gap: 10 (ref 7–16)
BUN: 35 mg/dL — ABNORMAL HIGH (ref 7–18)
Bilirubin,Total: 0.6 mg/dL (ref 0.2–1.0)
Chloride: 91 mmol/L — ABNORMAL LOW (ref 98–107)
Creatinine: 7.05 mg/dL — ABNORMAL HIGH (ref 0.60–1.30)
EGFR (African American): 9 — ABNORMAL LOW
Glucose: 386 mg/dL — ABNORMAL HIGH (ref 65–99)
Osmolality: 288 (ref 275–301)
Potassium: 4.2 mmol/L (ref 3.5–5.1)
Sodium: 132 mmol/L — ABNORMAL LOW (ref 136–145)
Total Protein: 8.8 g/dL — ABNORMAL HIGH (ref 6.4–8.2)

## 2011-03-23 LAB — CBC
HGB: 12.4 g/dL (ref 12.0–16.0)
MCH: 32.7 pg (ref 26.0–34.0)
MCV: 101 fL — ABNORMAL HIGH (ref 80–100)
RBC: 3.78 10*6/uL — ABNORMAL LOW (ref 3.80–5.20)

## 2011-03-23 LAB — PROTIME-INR: INR: 0.9

## 2011-03-23 LAB — APTT: Activated PTT: 25.2 secs (ref 23.6–35.9)

## 2011-03-23 LAB — TROPONIN I
Troponin-I: 0.03 ng/mL
Troponin-I: 0.03 ng/mL

## 2011-03-24 LAB — LIPID PANEL
HDL Cholesterol: 34 mg/dL — ABNORMAL LOW (ref 40–60)
Ldl Cholesterol, Calc: 98 mg/dL (ref 0–100)
Triglycerides: 231 mg/dL — ABNORMAL HIGH (ref 0–200)
VLDL Cholesterol, Calc: 46 mg/dL — ABNORMAL HIGH (ref 5–40)

## 2011-03-24 LAB — CBC WITH DIFFERENTIAL/PLATELET
Basophil %: 0.8 %
Eosinophil #: 0.5 10*3/uL (ref 0.0–0.7)
Eosinophil %: 5.9 %
HGB: 10.2 g/dL — ABNORMAL LOW (ref 12.0–16.0)
MCH: 32.7 pg (ref 26.0–34.0)
MCHC: 32.7 g/dL (ref 32.0–36.0)
MCV: 100 fL (ref 80–100)
Monocyte %: 10.7 %
Neutrophil %: 39.9 %
Platelet: 239 10*3/uL (ref 150–440)
RBC: 3.12 10*6/uL — ABNORMAL LOW (ref 3.80–5.20)
RDW: 17.6 % — ABNORMAL HIGH (ref 11.5–14.5)
WBC: 8.2 10*3/uL (ref 3.6–11.0)

## 2011-03-24 LAB — TROPONIN I: Troponin-I: 0.03 ng/mL

## 2011-03-24 LAB — BASIC METABOLIC PANEL
Anion Gap: 16 (ref 7–16)
BUN: 44 mg/dL — ABNORMAL HIGH (ref 7–18)
Creatinine: 8.78 mg/dL — ABNORMAL HIGH (ref 0.60–1.30)
EGFR (African American): 7 — ABNORMAL LOW
EGFR (Non-African Amer.): 6 — ABNORMAL LOW
Potassium: 3.7 mmol/L (ref 3.5–5.1)
Sodium: 136 mmol/L (ref 136–145)

## 2011-03-30 ENCOUNTER — Ambulatory Visit: Payer: Self-pay | Admitting: Vascular Surgery

## 2011-03-30 LAB — HCG, QUANTITATIVE, PREGNANCY: Beta Hcg, Quant.: 1 m[IU]/mL

## 2011-05-11 ENCOUNTER — Ambulatory Visit: Payer: Self-pay | Admitting: Vascular Surgery

## 2011-07-25 ENCOUNTER — Ambulatory Visit: Payer: Self-pay | Admitting: Vascular Surgery

## 2011-07-25 LAB — GLUCOSE, RANDOM: Glucose: 252 mg/dL — ABNORMAL HIGH (ref 65–99)

## 2011-07-25 LAB — POTASSIUM: Potassium: 4.5 mmol/L (ref 3.5–5.1)

## 2011-07-25 LAB — HCG, QUANTITATIVE, PREGNANCY: Beta Hcg, Quant.: <1 m[IU]/mL — ABNORMAL LOW

## 2011-09-05 ENCOUNTER — Ambulatory Visit: Payer: Self-pay | Admitting: Vascular Surgery

## 2011-10-12 ENCOUNTER — Ambulatory Visit: Payer: Self-pay | Admitting: Vascular Surgery

## 2011-10-19 ENCOUNTER — Emergency Department: Payer: Self-pay | Admitting: *Deleted

## 2012-02-01 HISTORY — PX: CARPAL TUNNEL RELEASE: SHX101

## 2012-02-04 ENCOUNTER — Ambulatory Visit: Payer: Self-pay | Admitting: Nephrology

## 2012-02-04 LAB — LIPASE, BLOOD: Lipase: 155 U/L (ref 73–393)

## 2012-02-04 LAB — HEPATIC FUNCTION PANEL A (ARMC)
Bilirubin, Direct: <0.1 mg/dL (ref 0.00–0.20)
Bilirubin,Total: 0.3 mg/dL (ref 0.2–1.0)
SGOT(AST): 18 U/L (ref 15–37)
Total Protein: 7.7 g/dL (ref 6.4–8.2)

## 2012-02-20 ENCOUNTER — Ambulatory Visit: Payer: Self-pay | Admitting: Vascular Surgery

## 2012-05-23 ENCOUNTER — Ambulatory Visit: Payer: Self-pay | Admitting: Vascular Surgery

## 2012-05-23 LAB — HCG, QUANTITATIVE, PREGNANCY: Beta Hcg, Quant.: <1 m[IU]/mL — ABNORMAL LOW

## 2012-05-23 LAB — POTASSIUM: Potassium: 3.1 mmol/L — ABNORMAL LOW (ref 3.5–5.1)

## 2012-07-30 ENCOUNTER — Emergency Department: Payer: Self-pay | Admitting: Internal Medicine

## 2012-10-08 ENCOUNTER — Ambulatory Visit: Payer: Self-pay | Admitting: Vascular Surgery

## 2012-10-08 LAB — BASIC METABOLIC PANEL
Anion Gap: 5 — ABNORMAL LOW (ref 7–16)
BUN: 36 mg/dL — ABNORMAL HIGH (ref 7–18)
Calcium, Total: 9 mg/dL (ref 8.5–10.1)
Chloride: 98 mmol/L (ref 98–107)
Co2: 33 mmol/L — ABNORMAL HIGH (ref 21–32)
EGFR (Non-African Amer.): 5 — ABNORMAL LOW
Osmolality: 282 (ref 275–301)
Sodium: 136 mmol/L (ref 136–145)

## 2012-10-08 LAB — HCG, QUANTITATIVE, PREGNANCY: Beta Hcg, Quant.: 4 m[IU]/mL — ABNORMAL HIGH

## 2013-02-04 ENCOUNTER — Ambulatory Visit: Payer: Self-pay | Admitting: Orthopedic Surgery

## 2013-02-04 LAB — CBC WITH DIFFERENTIAL/PLATELET
Basophil #: 0.1 10*3/uL (ref 0.0–0.1)
Basophil %: 0.7 %
Eosinophil #: 0.4 10*3/uL (ref 0.0–0.7)
Eosinophil %: 4.8 %
HCT: 33.5 % — AB (ref 35.0–47.0)
HGB: 11.3 g/dL — ABNORMAL LOW (ref 12.0–16.0)
LYMPHS ABS: 2.9 10*3/uL (ref 1.0–3.6)
Lymphocyte %: 34.1 %
MCH: 30.6 pg (ref 26.0–34.0)
MCHC: 33.9 g/dL (ref 32.0–36.0)
MCV: 90 fL (ref 80–100)
Monocyte #: 0.6 x10 3/mm (ref 0.2–0.9)
Monocyte %: 7 %
Neutrophil #: 4.6 10*3/uL (ref 1.4–6.5)
Neutrophil %: 53.4 %
PLATELETS: 180 10*3/uL (ref 150–440)
RBC: 3.7 10*6/uL — ABNORMAL LOW (ref 3.80–5.20)
RDW: 15 % — ABNORMAL HIGH (ref 11.5–14.5)
WBC: 8.5 10*3/uL (ref 3.6–11.0)

## 2013-02-04 LAB — BASIC METABOLIC PANEL
ANION GAP: 6 — AB (ref 7–16)
BUN: 44 mg/dL — ABNORMAL HIGH (ref 7–18)
Calcium, Total: 10.5 mg/dL — ABNORMAL HIGH (ref 8.5–10.1)
Chloride: 99 mmol/L (ref 98–107)
Co2: 30 mmol/L (ref 21–32)
Creatinine: 10.12 mg/dL — ABNORMAL HIGH (ref 0.60–1.30)
EGFR (African American): 5 — ABNORMAL LOW
EGFR (Non-African Amer.): 5 — ABNORMAL LOW
GLUCOSE: 138 mg/dL — AB (ref 65–99)
OSMOLALITY: 283 (ref 275–301)
POTASSIUM: 6 mmol/L — AB (ref 3.5–5.1)
Sodium: 135 mmol/L — ABNORMAL LOW (ref 136–145)

## 2013-02-08 ENCOUNTER — Ambulatory Visit: Payer: Self-pay | Admitting: Orthopedic Surgery

## 2013-02-09 ENCOUNTER — Emergency Department: Payer: Self-pay | Admitting: Emergency Medicine

## 2013-02-28 ENCOUNTER — Inpatient Hospital Stay: Payer: Self-pay | Admitting: Internal Medicine

## 2013-02-28 LAB — PHOSPHORUS: Phosphorus: 4.8 mg/dL (ref 2.5–4.9)

## 2013-02-28 LAB — CBC
HCT: 31.3 % — AB (ref 35.0–47.0)
HGB: 10.7 g/dL — AB (ref 12.0–16.0)
MCH: 30.9 pg (ref 26.0–34.0)
MCHC: 34.3 g/dL (ref 32.0–36.0)
MCV: 90 fL (ref 80–100)
PLATELETS: 142 10*3/uL — AB (ref 150–440)
RBC: 3.47 10*6/uL — ABNORMAL LOW (ref 3.80–5.20)
RDW: 14.6 % — AB (ref 11.5–14.5)
WBC: 10.9 10*3/uL (ref 3.6–11.0)

## 2013-02-28 LAB — COMPREHENSIVE METABOLIC PANEL
ALK PHOS: 42 U/L — AB
Albumin: 3.1 g/dL — ABNORMAL LOW (ref 3.4–5.0)
Anion Gap: 4 — ABNORMAL LOW (ref 7–16)
BILIRUBIN TOTAL: 0.5 mg/dL (ref 0.2–1.0)
BUN: 44 mg/dL — AB (ref 7–18)
CALCIUM: 9.2 mg/dL (ref 8.5–10.1)
CHLORIDE: 94 mmol/L — AB (ref 98–107)
CO2: 33 mmol/L — AB (ref 21–32)
Creatinine: 10.83 mg/dL — ABNORMAL HIGH (ref 0.60–1.30)
EGFR (African American): 5 — ABNORMAL LOW
EGFR (Non-African Amer.): 4 — ABNORMAL LOW
Glucose: 109 mg/dL — ABNORMAL HIGH (ref 65–99)
Osmolality: 274 (ref 275–301)
POTASSIUM: 4.3 mmol/L (ref 3.5–5.1)
SGOT(AST): 16 U/L (ref 15–37)
SODIUM: 131 mmol/L — AB (ref 136–145)
Total Protein: 7.8 g/dL (ref 6.4–8.2)

## 2013-02-28 LAB — RAPID INFLUENZA A&B ANTIGENS

## 2013-03-01 LAB — BASIC METABOLIC PANEL
Anion Gap: 6 — ABNORMAL LOW (ref 7–16)
BUN: 24 mg/dL — AB (ref 7–18)
Calcium, Total: 8.7 mg/dL (ref 8.5–10.1)
Chloride: 96 mmol/L — ABNORMAL LOW (ref 98–107)
Co2: 31 mmol/L (ref 21–32)
Creatinine: 7.03 mg/dL — ABNORMAL HIGH (ref 0.60–1.30)
EGFR (African American): 8 — ABNORMAL LOW
EGFR (Non-African Amer.): 7 — ABNORMAL LOW
Glucose: 124 mg/dL — ABNORMAL HIGH (ref 65–99)
Osmolality: 272 (ref 275–301)
POTASSIUM: 3.7 mmol/L (ref 3.5–5.1)
Sodium: 133 mmol/L — ABNORMAL LOW (ref 136–145)

## 2013-03-01 LAB — CBC WITH DIFFERENTIAL/PLATELET
Basophil #: 0 10*3/uL (ref 0.0–0.1)
Basophil %: 0.8 %
EOS ABS: 0 10*3/uL (ref 0.0–0.7)
Eosinophil %: 0.9 %
HCT: 27.6 % — AB (ref 35.0–47.0)
HGB: 9.4 g/dL — ABNORMAL LOW (ref 12.0–16.0)
Lymphocyte #: 0.8 10*3/uL — ABNORMAL LOW (ref 1.0–3.6)
Lymphocyte %: 17.4 %
MCH: 30.8 pg (ref 26.0–34.0)
MCHC: 34 g/dL (ref 32.0–36.0)
MCV: 91 fL (ref 80–100)
Monocyte #: 0.5 x10 3/mm (ref 0.2–0.9)
Monocyte %: 11.5 %
NEUTROS ABS: 3 10*3/uL (ref 1.4–6.5)
Neutrophil %: 69.4 %
PLATELETS: 89 10*3/uL — AB (ref 150–440)
RBC: 3.04 10*6/uL — ABNORMAL LOW (ref 3.80–5.20)
RDW: 14.9 % — AB (ref 11.5–14.5)
WBC: 4.4 10*3/uL (ref 3.6–11.0)

## 2013-03-02 LAB — PHOSPHORUS: Phosphorus: 6.1 mg/dL — ABNORMAL HIGH (ref 2.5–4.9)

## 2013-03-05 LAB — CBC WITH DIFFERENTIAL/PLATELET
BASOS PCT: 0.7 %
Basophil #: 0 10*3/uL (ref 0.0–0.1)
EOS ABS: 0.3 10*3/uL (ref 0.0–0.7)
Eosinophil %: 4.5 %
HCT: 25.8 % — AB (ref 35.0–47.0)
HGB: 8.8 g/dL — AB (ref 12.0–16.0)
Lymphocyte #: 2.1 10*3/uL (ref 1.0–3.6)
Lymphocyte %: 31.4 %
MCH: 31.1 pg (ref 26.0–34.0)
MCHC: 34.3 g/dL (ref 32.0–36.0)
MCV: 91 fL (ref 80–100)
MONO ABS: 0.5 x10 3/mm (ref 0.2–0.9)
MONOS PCT: 8 %
Neutrophil #: 3.8 10*3/uL (ref 1.4–6.5)
Neutrophil %: 55.4 %
Platelet: 139 10*3/uL — ABNORMAL LOW (ref 150–440)
RBC: 2.84 10*6/uL — AB (ref 3.80–5.20)
RDW: 14.7 % — AB (ref 11.5–14.5)
WBC: 6.8 10*3/uL (ref 3.6–11.0)

## 2013-03-05 LAB — PHOSPHORUS: PHOSPHORUS: 7.1 mg/dL — AB (ref 2.5–4.9)

## 2013-03-05 LAB — CULTURE, BLOOD (SINGLE)

## 2013-03-07 LAB — CULTURE, BLOOD (SINGLE)

## 2013-03-10 LAB — CULTURE, BLOOD (SINGLE)

## 2013-04-08 ENCOUNTER — Ambulatory Visit: Payer: Self-pay | Admitting: Vascular Surgery

## 2013-04-08 LAB — BASIC METABOLIC PANEL
ANION GAP: 9 (ref 7–16)
BUN: 49 mg/dL — ABNORMAL HIGH (ref 7–18)
Calcium, Total: 8.7 mg/dL (ref 8.5–10.1)
Chloride: 96 mmol/L — ABNORMAL LOW (ref 98–107)
Co2: 24 mmol/L (ref 21–32)
Creatinine: 9.21 mg/dL — ABNORMAL HIGH (ref 0.60–1.30)
EGFR (African American): 6 — ABNORMAL LOW
EGFR (Non-African Amer.): 5 — ABNORMAL LOW
Glucose: 317 mg/dL — ABNORMAL HIGH (ref 65–99)
Osmolality: 284 (ref 275–301)
Potassium: 5.1 mmol/L (ref 3.5–5.1)
SODIUM: 129 mmol/L — AB (ref 136–145)

## 2013-04-08 LAB — CBC
HCT: 33 % — ABNORMAL LOW (ref 35.0–47.0)
HGB: 11.1 g/dL — ABNORMAL LOW (ref 12.0–16.0)
MCH: 30.8 pg (ref 26.0–34.0)
MCHC: 33.7 g/dL (ref 32.0–36.0)
MCV: 91 fL (ref 80–100)
Platelet: 171 10*3/uL (ref 150–440)
RBC: 3.61 10*6/uL — ABNORMAL LOW (ref 3.80–5.20)
RDW: 16.3 % — ABNORMAL HIGH (ref 11.5–14.5)
WBC: 7 10*3/uL (ref 3.6–11.0)

## 2013-04-08 LAB — PROTIME-INR
INR: 1.1
Prothrombin Time: 13.7 secs (ref 11.5–14.7)

## 2013-04-08 LAB — APTT: Activated PTT: 28 secs (ref 23.6–35.9)

## 2013-04-17 ENCOUNTER — Ambulatory Visit: Payer: Self-pay | Admitting: Vascular Surgery

## 2013-04-17 LAB — POTASSIUM: POTASSIUM: 4.4 mmol/L (ref 3.5–5.1)

## 2013-05-10 IMAGING — US US RENAL
1 series · 14 of 25 positions shown · non-contrast
Comparison: none

CLINICAL DATA: Elevated creatinine.

RENAL/URINARY TRACT ULTRASOUND COMPLETE

[Series 1: us renal · 0.31mm/px · 14 of 37 slices shown]
[im 1/37]
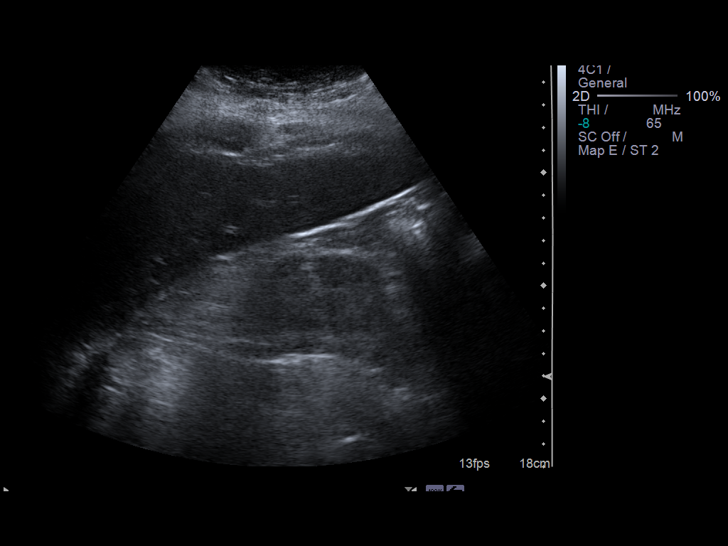
[im 4/37]
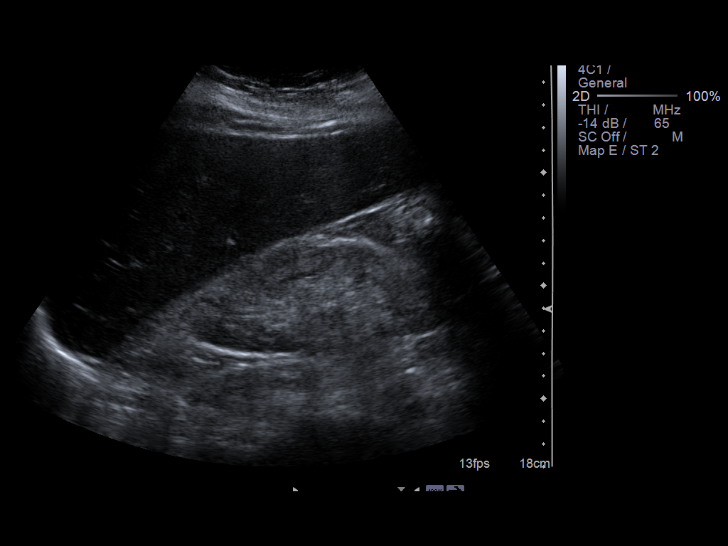
[im 7/37]
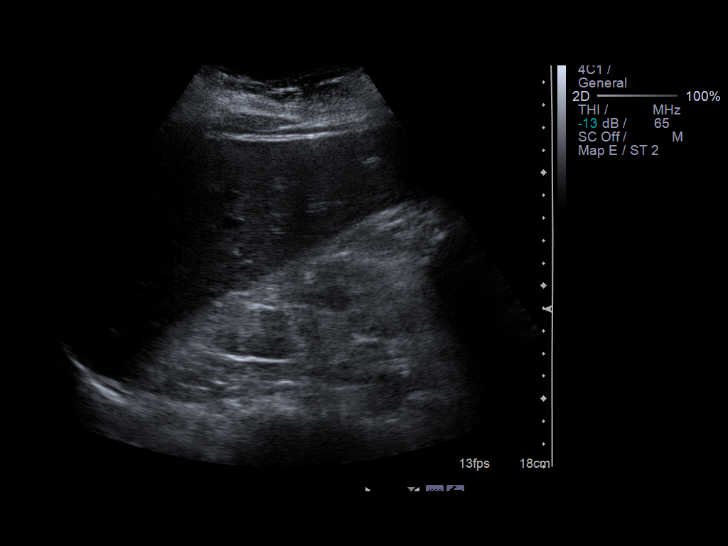
[im 10/37]
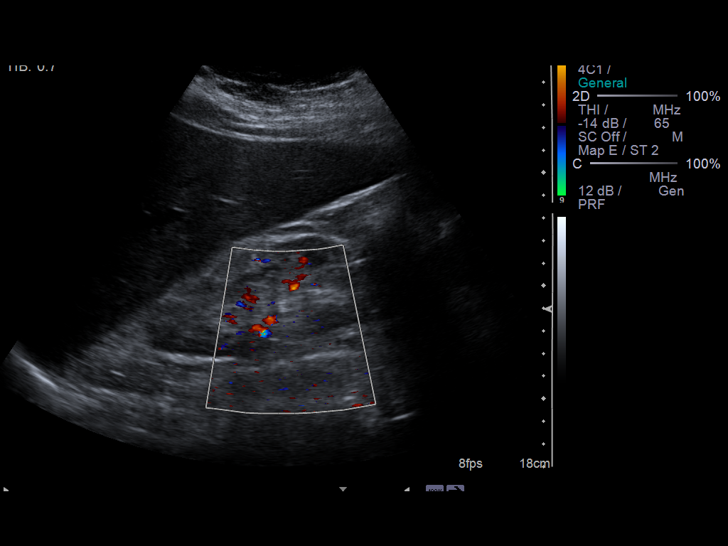
[im 13/37]
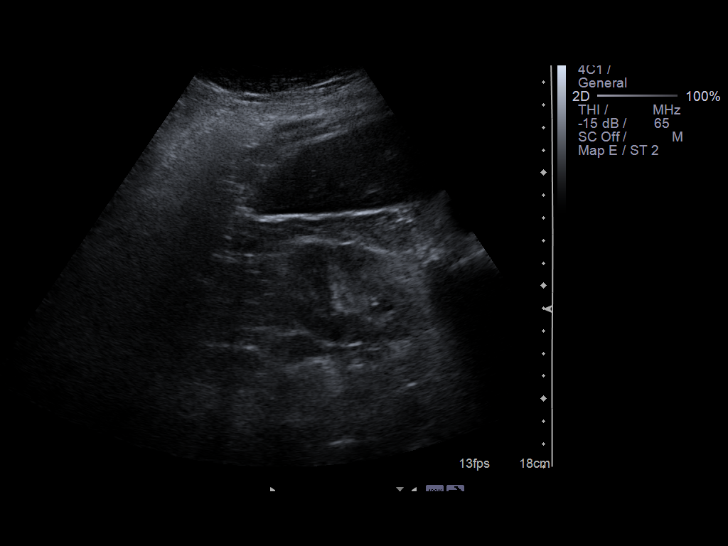
[im 14/37]
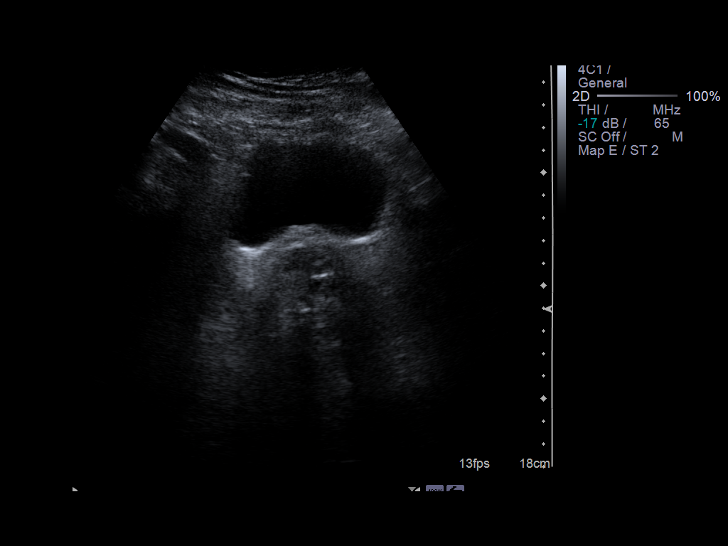
[im 17/37]
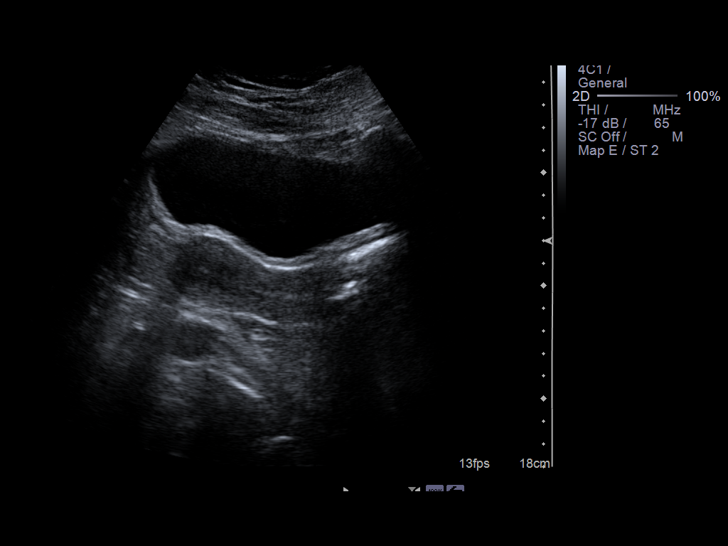
[im 20/37]
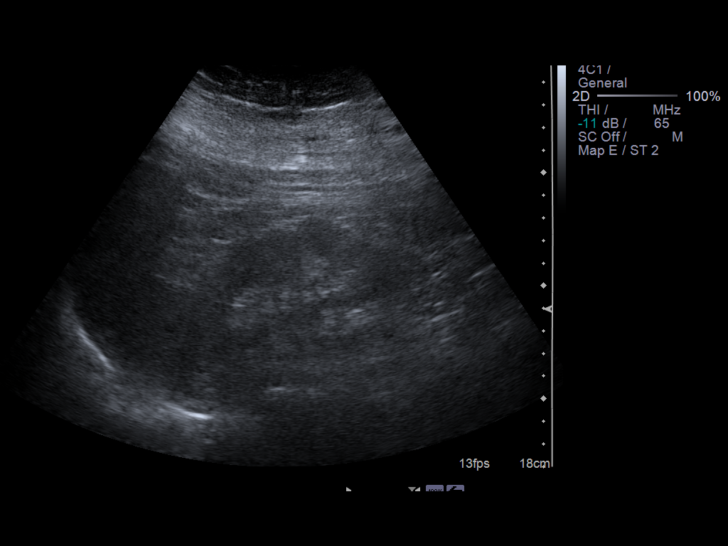
[im 23/37]
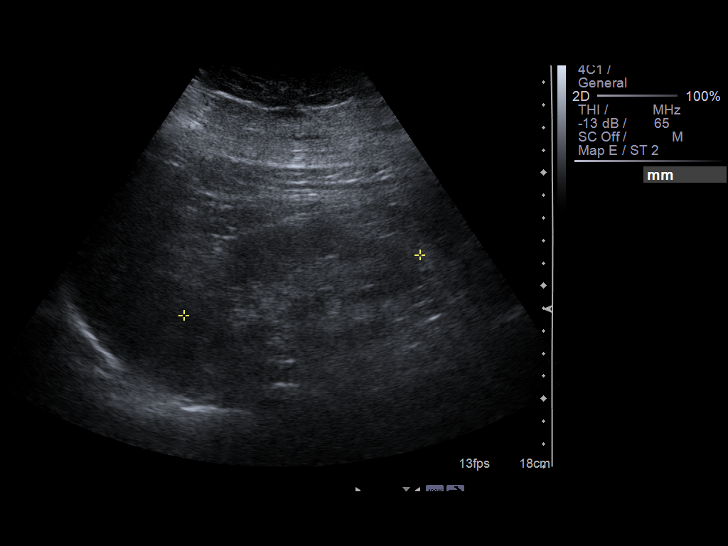
[im 25/37]
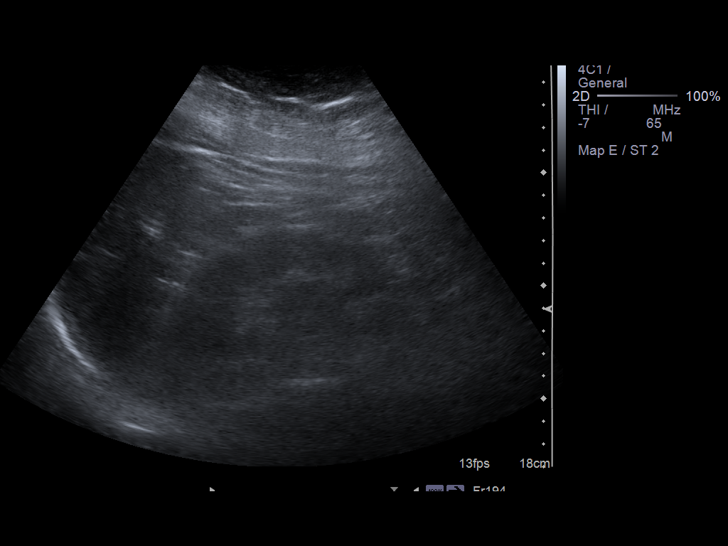
[im 28/37]
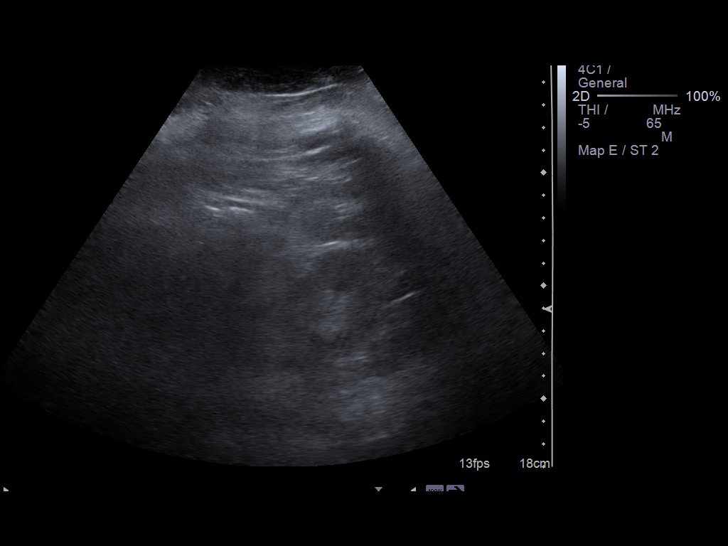
[im 31/37]
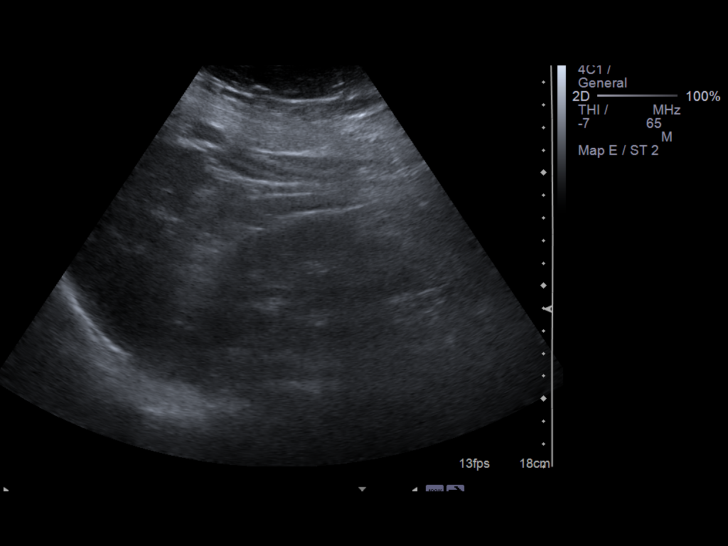
[im 34/37]
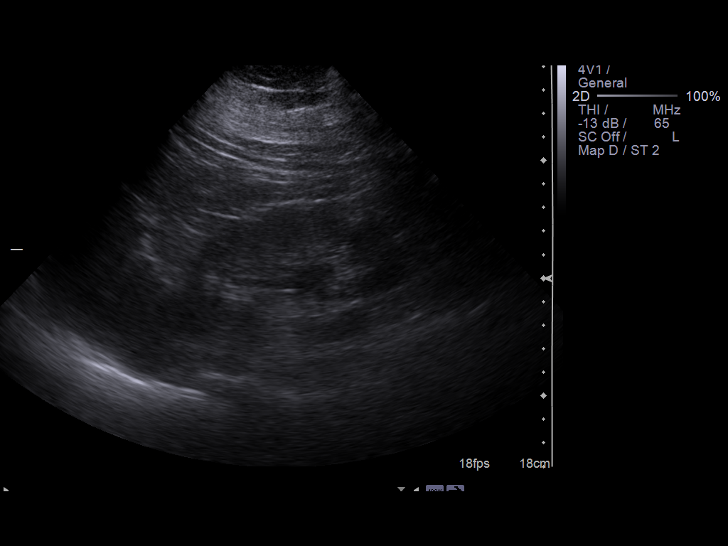
[im 37/37]
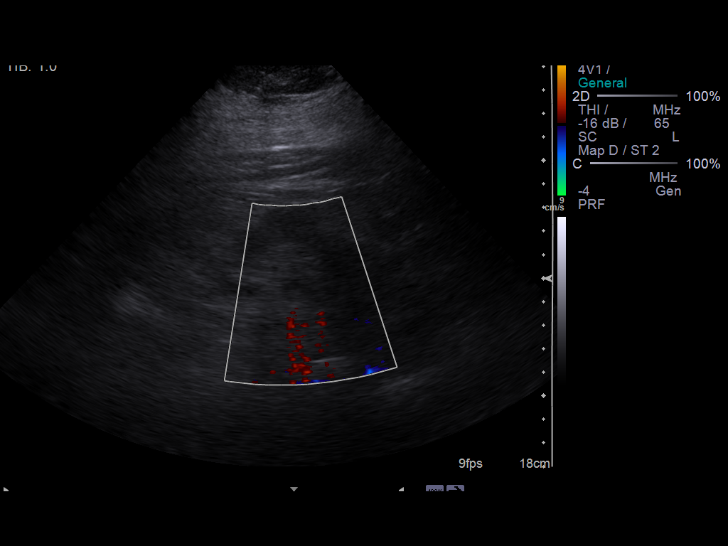

[14 of 25 positions shown; findings below may reference images not displayed]

FINDINGS: Right Kidney:  12.0 cm.  Significant increase and echotexture
diffusely throughout the right kidney.  No focal abnormality or
hydronephrosis.

Left Kidney:  10.8 cm.  Increased echotexture throughout the left
kidney.  No focal abnormality.  Slight prominence of the renal
pelvis is noted.  The calyces are not dilated.  This was also
mentioned on prior study from 3009 and therefore felt to likely not
be clinically significant.

Bladder:  Normal for degree distention.
IMPRESSION: Increased echotexture throughout the kidneys compatible with
medical renal disease.  Given the normal size of the kidneys, HIV
nephropathy is possible.  No hydronephrosis.

## 2013-05-11 IMAGING — XA IR FLUORO GUIDE CV LINE*L*
1 series · 1 of 1 positions shown · non-contrast
Comparison: none

CLINICAL DATA: Renal failure and need for tunneled dialysis
catheter.

[Series 1: fl cto · 1 of 1 slices shown]
[im 1/1]
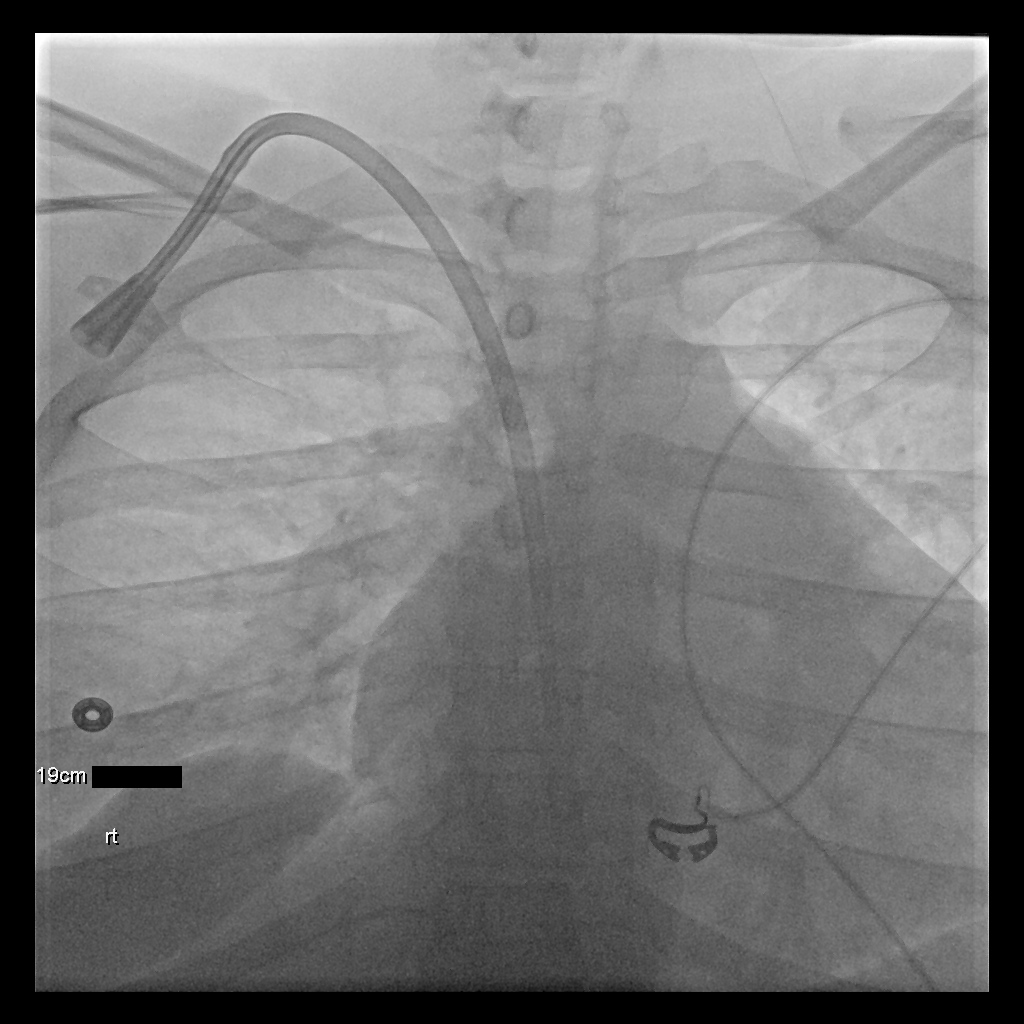

[1 of 1 positions shown; findings below may reference images not displayed]

TUNNELED CENTRAL VENOUS HEMODIALYSIS CATHETER PLACEMENT WITH
ULTRASOUND AND FLUOROSCOPIC GUIDANCE

Sedation:  1.5 mg IV Versed; 75 mcg IV Fentanyl.

Total Moderate Sedation Time:  30 minutes.

Additional Medications:  1 gram IV Ancef.  IV antibiotic was given
in an appropriate time interval of less than 1 hour prior to skin
puncture.

Fluoroscopy Time:  0.5 minutes.

Procedure:  The procedure, risks, benefits, and alternatives were
explained to the patient.  Questions regarding the procedure were
encouraged and answered.  The patient understands and consents to
the procedure.

The right neck and chest were prepped with chlorhexidine in a
sterile fashion, and a sterile drape was applied covering the
operative field.  Maximum barrier sterile technique with sterile
gowns and gloves were used for the procedure.  Local anesthesia was
provided with 1% lidocaine.

After creating a small venotomy incision, a 21 gauge needle was
advanced into the right internal jugular vein under direct, real-
time ultrasound guidance.  Ultrasound image documentation was
performed.  After securing guidewire access, an 8 Fr dilator was
placed.  A J-wire was kinked to measure appropriate catheter
length.

Tak Ngai Padolina HemoSplit tunneled hemodialysis catheter measuring 19 cm
from tip to cuff was chosen for placement.  This was tunneled in a
retrograde fashion from the chest wall to the venotomy incision.

At the venotomy, serial dilatation was performed and a 16 Fr peel-
away sheath was placed over a guidewire.  The catheter was then
placed through the sheath and the sheath removed.  Final catheter
positioning was confirmed and documented with a fluoroscopic spot
image.  The catheter was aspirated, flushed with saline, and
injected with appropriate volume heparin dwells.

The venotomy incision was closed with subcutaneous 3-0 Monocryl and
subcuticular 4-0 Vicryl.  Dermabond was applied to the incision.
The catheter exit site was secured with 0-Prolene retention
sutures.

Complications: None.  No pneumothorax.
FINDINGS: After catheter placement, the tips lie in the right
atrium.  The catheter aspirates normally and is ready for immediate
use.
IMPRESSION: Placement of tunneled hemodialysis catheter via the right internal
jugular vein.  The catheter tips lie in the right atrium.  The
catheter is ready for immediate use.

## 2013-05-11 IMAGING — CR DG CHEST 2V
1 series · 1 of 1 positions shown · non-contrast
Comparison: None.

CLINICAL DATA: CHF, shortness of breath

CHEST - 2 VIEW

[w chest lat]
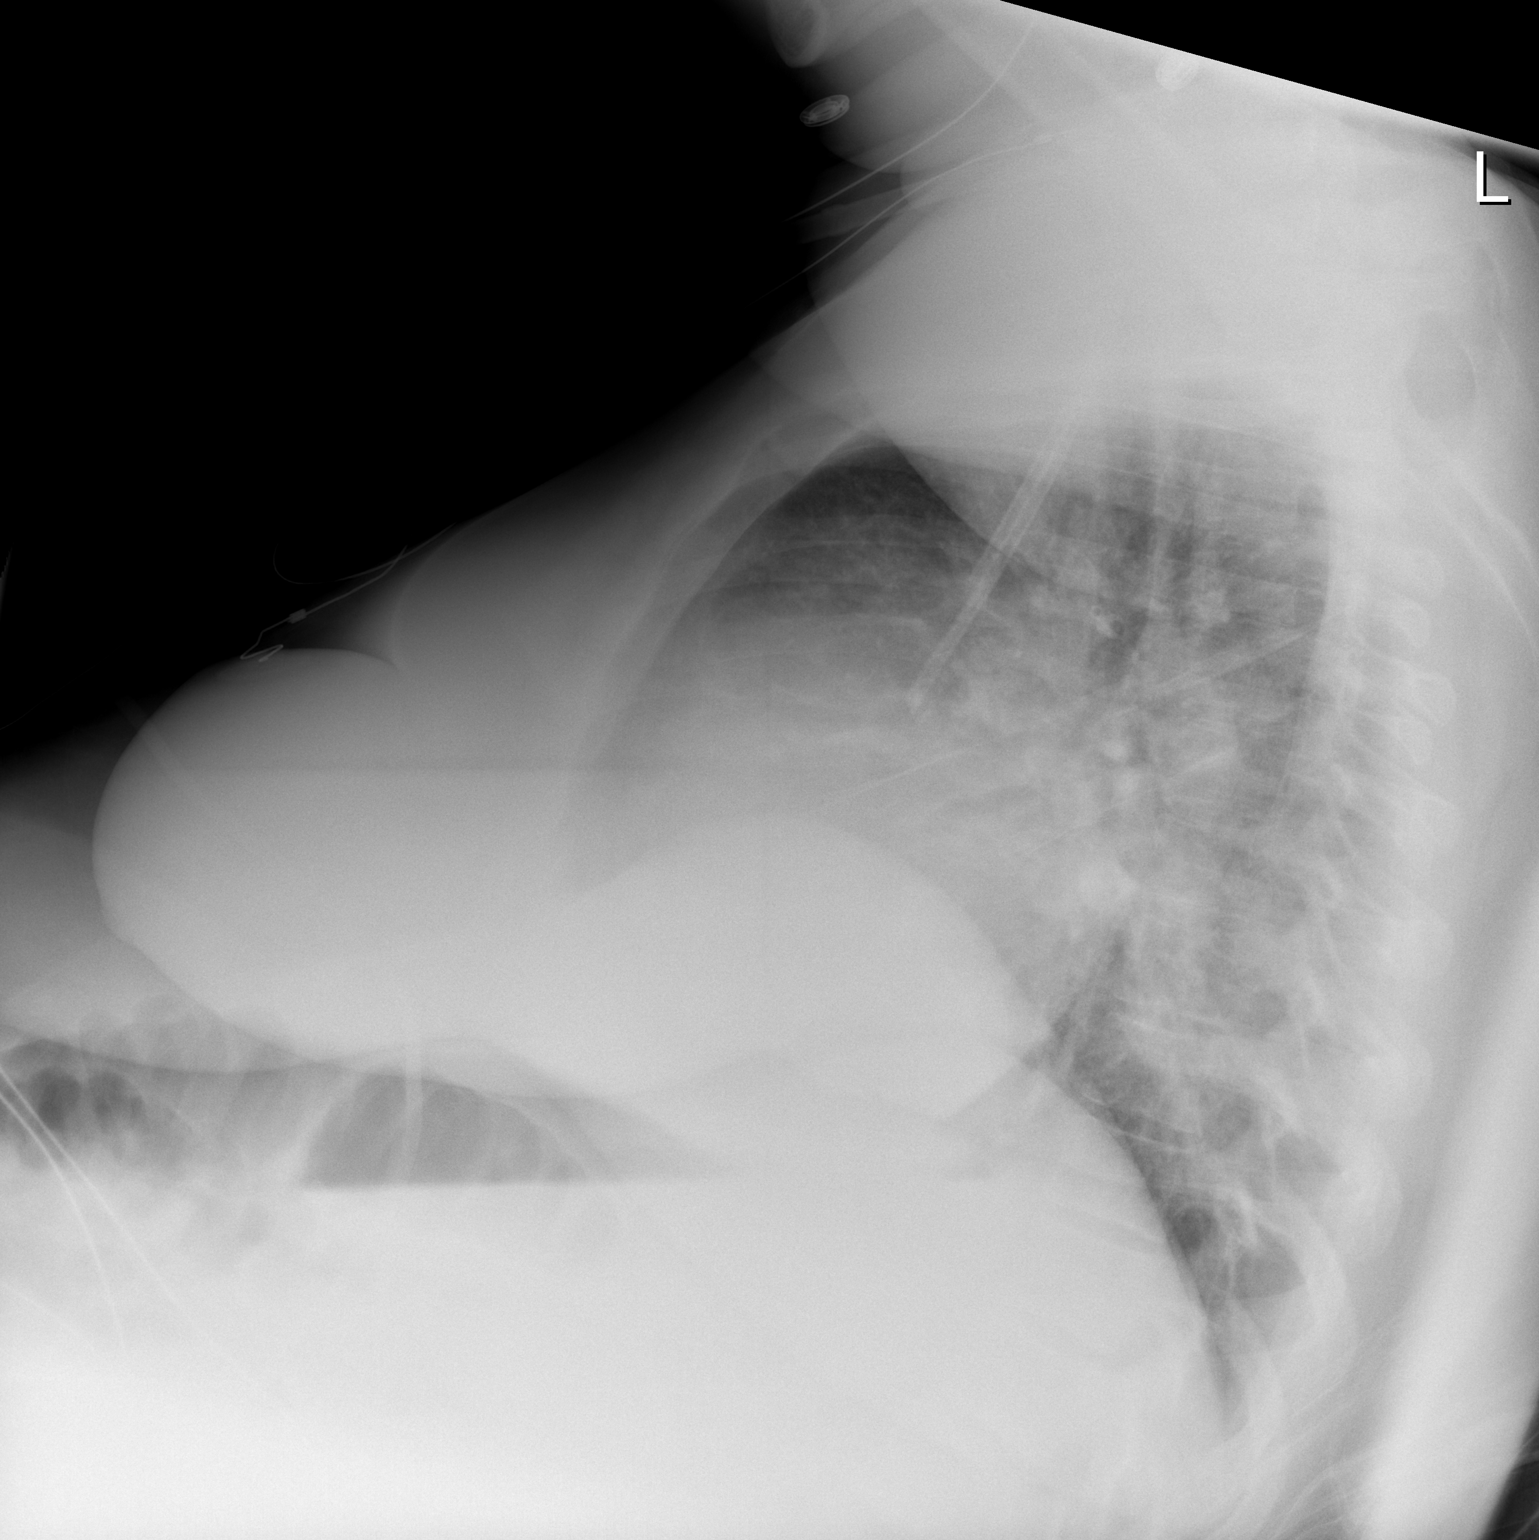

[1 of 1 positions shown; findings below may reference images not displayed]

FINDINGS: Lungs are clear. No pleural effusion or pneumothorax.

Cardiomegaly. Right IJ dual lumen venous catheter with its tip at
the cavoatrial junction.

Degenerative changes of the visualized thoracolumbar spine.
IMPRESSION: Right IJ dual lumen venous catheter with its tip at the cavoatrial
junction.  No pneumothorax.

Cardiomegaly.

## 2013-05-11 IMAGING — US IR FLUORO GUIDE CV LINE*L*
1 series · 2 of 2 positions shown · non-contrast
Comparison: none

CLINICAL DATA: Renal failure and need for tunneled dialysis
catheter.

[Series 1: ir fluoro guide cv line*left* · 2 of 2 slices shown]
[im 1/2]
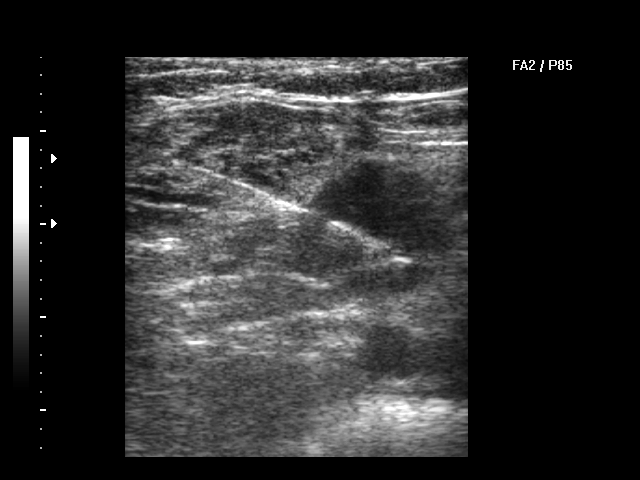
[im 2/2]
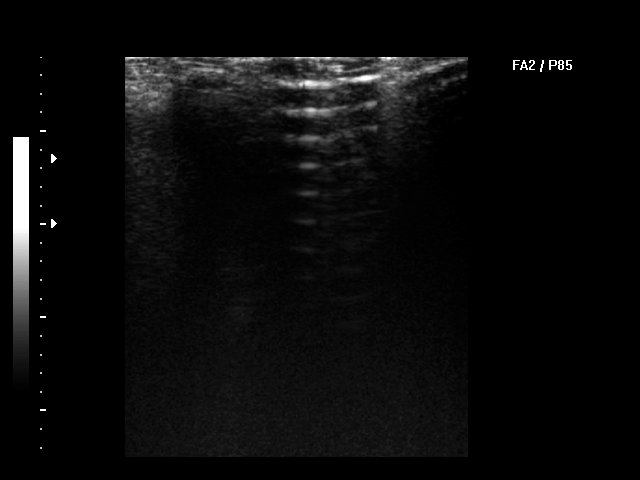

[2 of 2 positions shown; findings below may reference images not displayed]

TUNNELED CENTRAL VENOUS HEMODIALYSIS CATHETER PLACEMENT WITH
ULTRASOUND AND FLUOROSCOPIC GUIDANCE

Sedation:  1.5 mg IV Versed; 75 mcg IV Fentanyl.

Total Moderate Sedation Time:  30 minutes.

Additional Medications:  1 gram IV Ancef.  IV antibiotic was given
in an appropriate time interval of less than 1 hour prior to skin
puncture.

Fluoroscopy Time:  0.5 minutes.

Procedure:  The procedure, risks, benefits, and alternatives were
explained to the patient.  Questions regarding the procedure were
encouraged and answered.  The patient understands and consents to
the procedure.

The right neck and chest were prepped with chlorhexidine in a
sterile fashion, and a sterile drape was applied covering the
operative field.  Maximum barrier sterile technique with sterile
gowns and gloves were used for the procedure.  Local anesthesia was
provided with 1% lidocaine.

After creating a small venotomy incision, a 21 gauge needle was
advanced into the right internal jugular vein under direct, real-
time ultrasound guidance.  Ultrasound image documentation was
performed.  After securing guidewire access, an 8 Fr dilator was
placed.  A J-wire was kinked to measure appropriate catheter
length.

Tak Ngai Padolina HemoSplit tunneled hemodialysis catheter measuring 19 cm
from tip to cuff was chosen for placement.  This was tunneled in a
retrograde fashion from the chest wall to the venotomy incision.

At the venotomy, serial dilatation was performed and a 16 Fr peel-
away sheath was placed over a guidewire.  The catheter was then
placed through the sheath and the sheath removed.  Final catheter
positioning was confirmed and documented with a fluoroscopic spot
image.  The catheter was aspirated, flushed with saline, and
injected with appropriate volume heparin dwells.

The venotomy incision was closed with subcutaneous 3-0 Monocryl and
subcuticular 4-0 Vicryl.  Dermabond was applied to the incision.
The catheter exit site was secured with 0-Prolene retention
sutures.

Complications: None.  No pneumothorax.
FINDINGS: After catheter placement, the tips lie in the right
atrium.  The catheter aspirates normally and is ready for immediate
use.
IMPRESSION: Placement of tunneled hemodialysis catheter via the right internal
jugular vein.  The catheter tips lie in the right atrium.  The
catheter is ready for immediate use.

## 2013-05-14 IMAGING — US US BIOPSY
1 series · 7 of 7 positions shown · non-contrast
Comparison: none

CLINICAL DATA: Diabetes, hypertension, renal failure

[Series 1: us biopsy · 0.30mm/px · 7 of 7 slices shown]
[im 1/7]
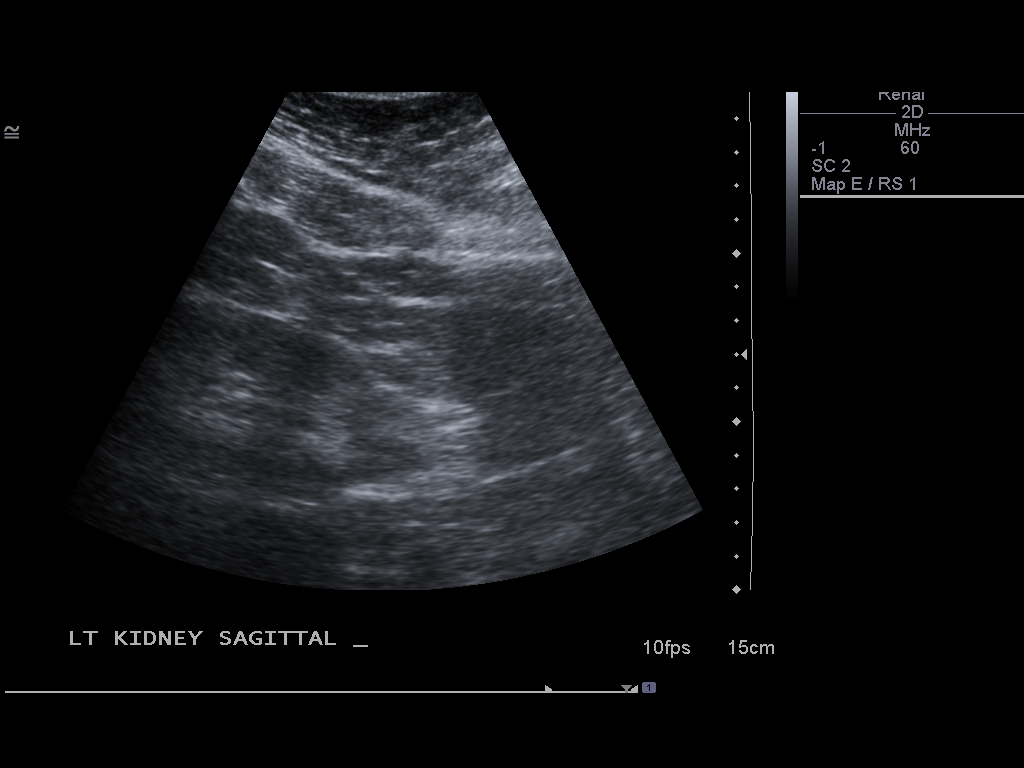
[im 2/7]
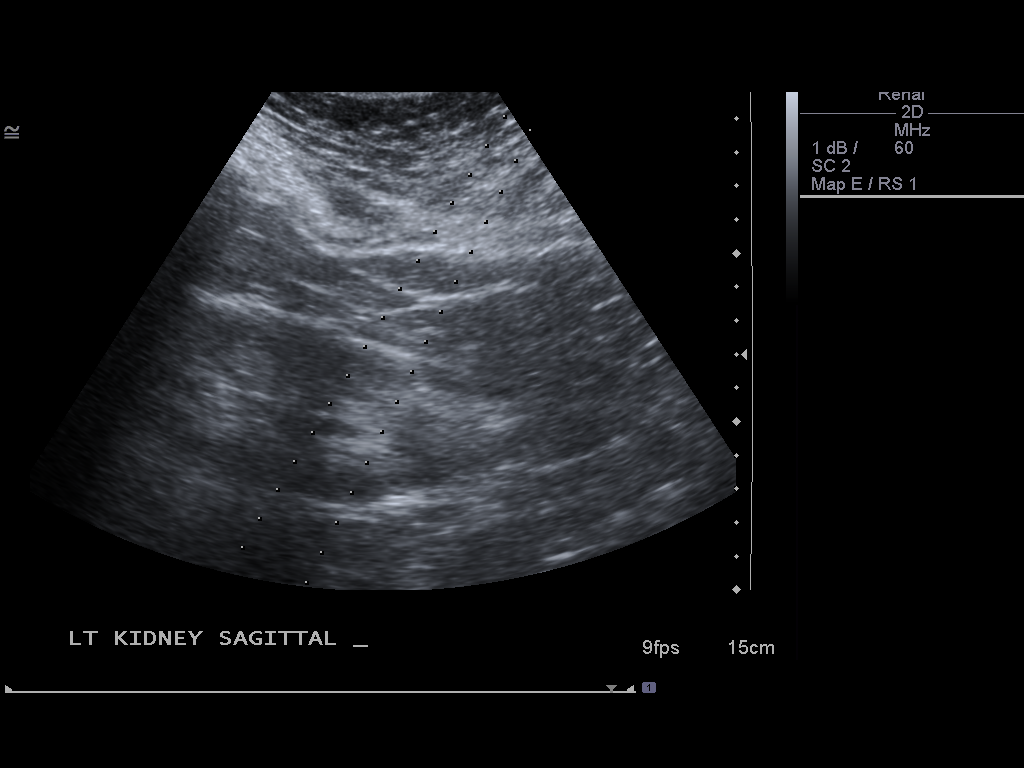
[im 3/7]
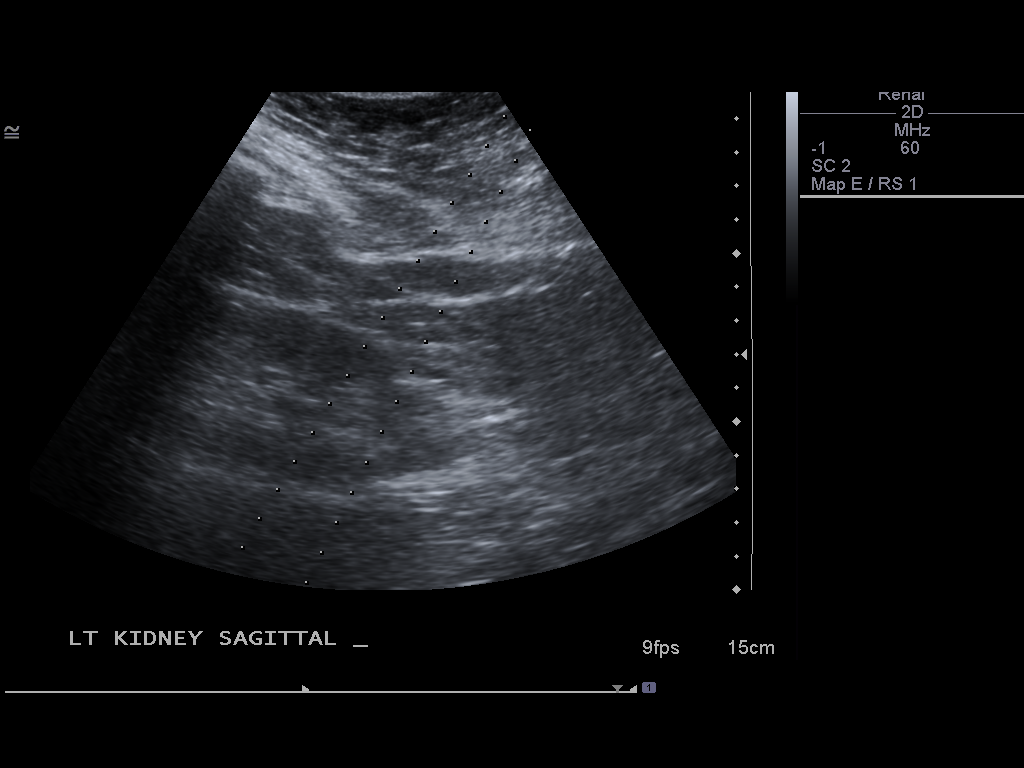
[im 4/7]
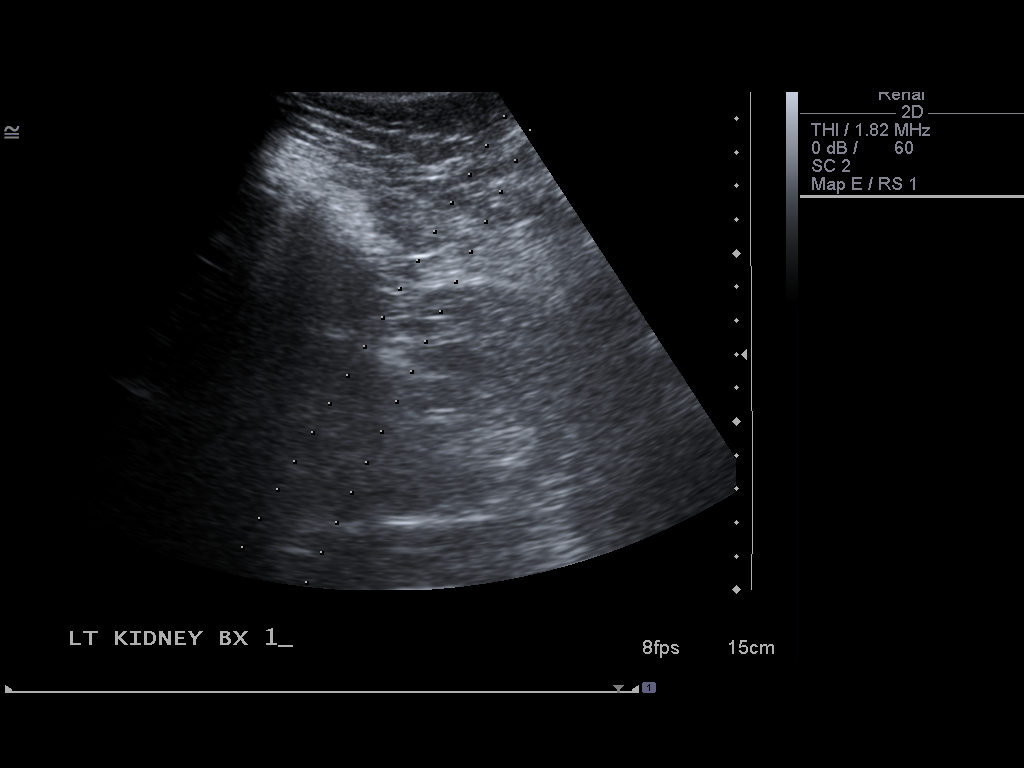
[im 5/7]
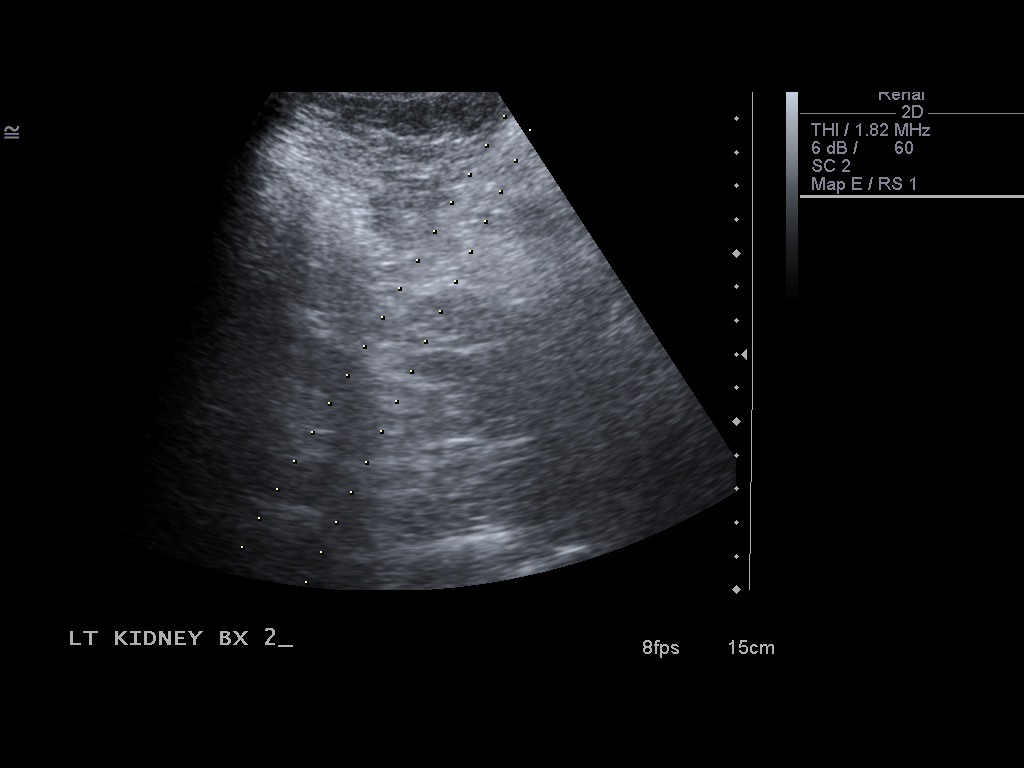
[im 6/7]
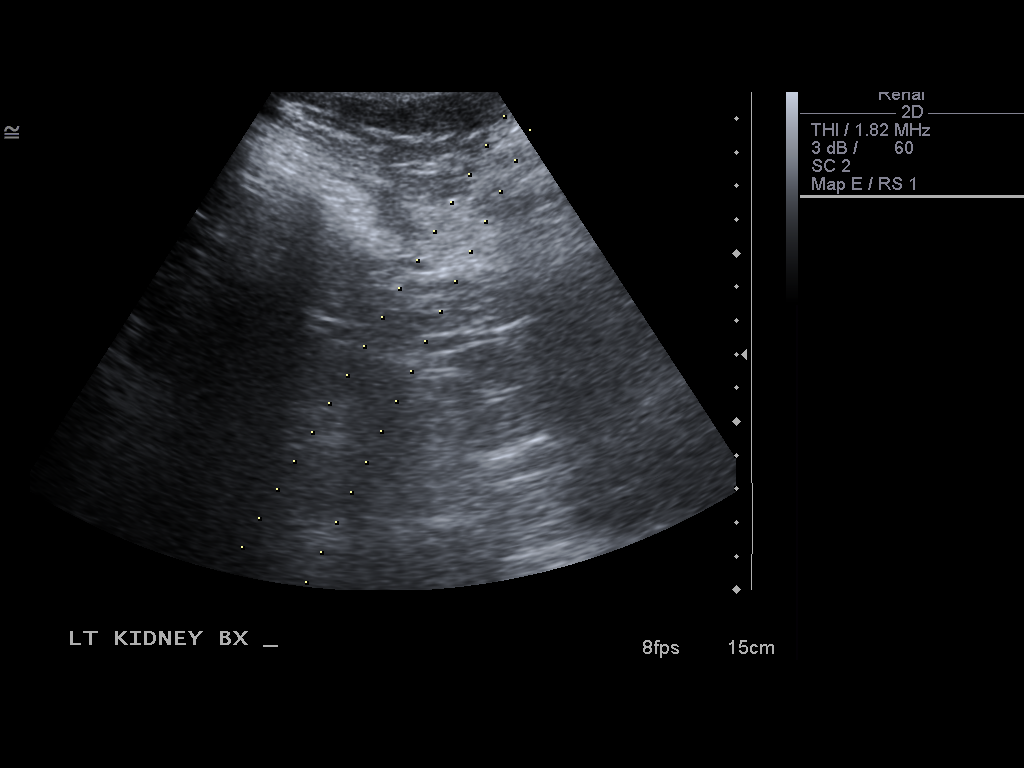
[im 7/7]
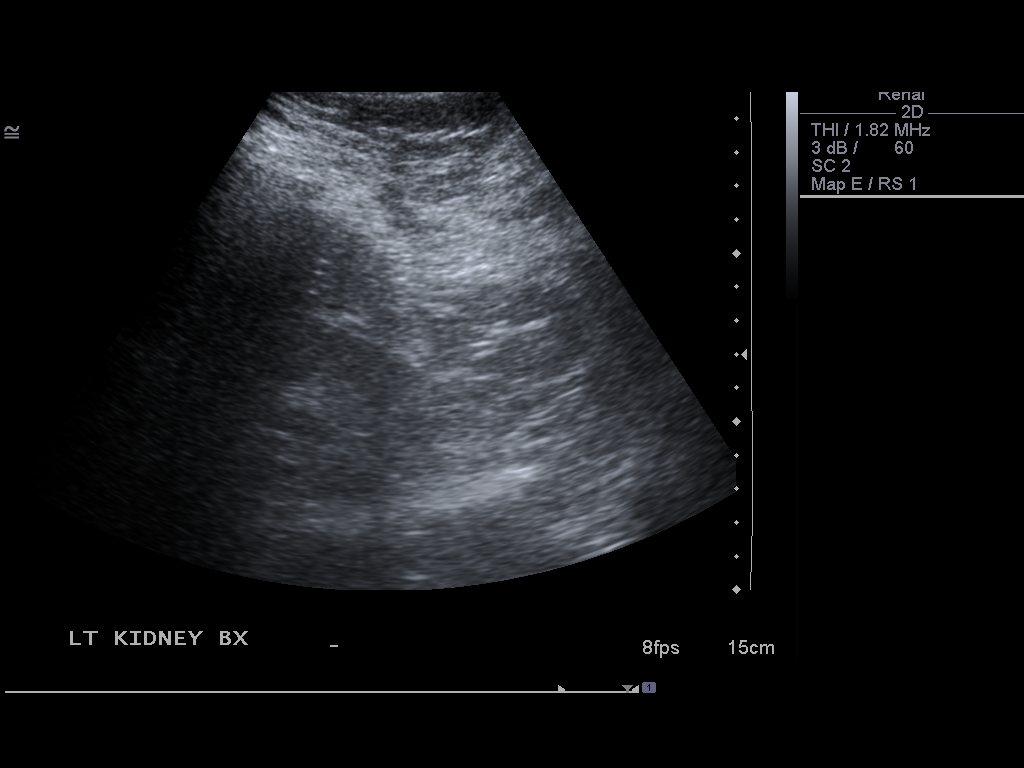

[7 of 7 positions shown; findings below may reference images not displayed]

ULTRASOUND LEFT RENAL LOWER POLE CORTEX 16 GAUGE CORE BIOPSY

Date:  08/20/2010 [DATE]

Radiologist:  Azali Yacub, M.D.

Medications:  100 mcg Fentanyl

Guidance:  Ultrasound

Fluoroscopy time:  13 minutes

Contrast volume:  No immediate

PROCEDURE/FINDINGS:

Informed consent was obtained from the patient following
explanation of the procedure, risks, benefits and alternatives.
The patient understands, agrees and consents for the procedure.
All questions were addressed.  A time out was performed.

Maximal barrier sterile technique utilized including caps, mask,
sterile gowns, sterile gloves, large sterile drape, hand hygiene,
and betadine

Preliminary ultrasound performed of the kidneys.  Left kidney lower
pole was localized.  Left kidney lower pole was localized with the
patient prone.  Under sterile conditions and local anesthesia, a 16
gauge 15 cm core biopsy needle was advanced to the left kidney
lower pole under direct visualization with ultrasound.  Three 16
gauge core biopsies were obtained and placed in saline.  Post
procedure imaging demonstrates no evidence of hemorrhage or
hematoma.  The patient tolerated the biopsy well.
IMPRESSION: Ultrasound left renal lower pole cortex 16 gauge core biopsies.

## 2013-05-27 ENCOUNTER — Ambulatory Visit: Payer: Self-pay | Admitting: Vascular Surgery

## 2013-07-01 ENCOUNTER — Ambulatory Visit: Payer: Self-pay | Admitting: Vascular Surgery

## 2013-07-15 ENCOUNTER — Inpatient Hospital Stay: Payer: Self-pay | Admitting: Internal Medicine

## 2013-07-15 LAB — CBC WITH DIFFERENTIAL/PLATELET
BASOS ABS: 0 10*3/uL (ref 0.0–0.1)
BASOS PCT: 0.5 %
EOS ABS: 0.2 10*3/uL (ref 0.0–0.7)
Eosinophil %: 2.3 %
HCT: 29.9 % — ABNORMAL LOW (ref 35.0–47.0)
HGB: 9.6 g/dL — ABNORMAL LOW (ref 12.0–16.0)
LYMPHS ABS: 0.9 10*3/uL — AB (ref 1.0–3.6)
LYMPHS PCT: 9.8 %
MCH: 27.7 pg (ref 26.0–34.0)
MCHC: 32 g/dL (ref 32.0–36.0)
MCV: 87 fL (ref 80–100)
MONO ABS: 0.6 x10 3/mm (ref 0.2–0.9)
Monocyte %: 7 %
NEUTROS PCT: 80.4 %
Neutrophil #: 7.2 10*3/uL — ABNORMAL HIGH (ref 1.4–6.5)
PLATELETS: 177 10*3/uL (ref 150–440)
RBC: 3.45 10*6/uL — AB (ref 3.80–5.20)
RDW: 16.2 % — ABNORMAL HIGH (ref 11.5–14.5)
WBC: 8.9 10*3/uL (ref 3.6–11.0)

## 2013-07-15 LAB — COMPREHENSIVE METABOLIC PANEL
ALBUMIN: 3 g/dL — AB (ref 3.4–5.0)
ALK PHOS: 61 U/L
ANION GAP: 16 (ref 7–16)
BUN: 65 mg/dL — AB (ref 7–18)
Bilirubin,Total: 0.4 mg/dL (ref 0.2–1.0)
CALCIUM: 9.4 mg/dL (ref 8.5–10.1)
Chloride: 93 mmol/L — ABNORMAL LOW (ref 98–107)
Co2: 24 mmol/L (ref 21–32)
Creatinine: 11.78 mg/dL — ABNORMAL HIGH (ref 0.60–1.30)
EGFR (African American): 4 — ABNORMAL LOW
EGFR (Non-African Amer.): 4 — ABNORMAL LOW
Glucose: 191 mg/dL — ABNORMAL HIGH (ref 65–99)
Osmolality: 290 (ref 275–301)
POTASSIUM: 4.2 mmol/L (ref 3.5–5.1)
SGOT(AST): 11 U/L — ABNORMAL LOW (ref 15–37)
SODIUM: 133 mmol/L — AB (ref 136–145)
TOTAL PROTEIN: 8.6 g/dL — AB (ref 6.4–8.2)

## 2013-07-16 LAB — BASIC METABOLIC PANEL
Anion Gap: 11 (ref 7–16)
BUN: 74 mg/dL — ABNORMAL HIGH (ref 7–18)
CALCIUM: 8.9 mg/dL (ref 8.5–10.1)
CO2: 23 mmol/L (ref 21–32)
Chloride: 96 mmol/L — ABNORMAL LOW (ref 98–107)
Creatinine: 12.94 mg/dL — ABNORMAL HIGH (ref 0.60–1.30)
GFR CALC AF AMER: 4 — AB
GFR CALC NON AF AMER: 3 — AB
Glucose: 152 mg/dL — ABNORMAL HIGH (ref 65–99)
Osmolality: 286 (ref 275–301)
Potassium: 4.4 mmol/L (ref 3.5–5.1)
SODIUM: 130 mmol/L — AB (ref 136–145)

## 2013-07-16 LAB — PHOSPHORUS: Phosphorus: 10.3 mg/dL — ABNORMAL HIGH (ref 2.5–4.9)

## 2013-07-16 LAB — CBC WITH DIFFERENTIAL/PLATELET
Basophil #: 0 10*3/uL (ref 0.0–0.1)
Basophil %: 0.4 %
Eosinophil #: 0.1 10*3/uL (ref 0.0–0.7)
Eosinophil %: 1.9 %
HCT: 23.8 % — ABNORMAL LOW (ref 35.0–47.0)
HGB: 8.1 g/dL — ABNORMAL LOW (ref 12.0–16.0)
LYMPHS ABS: 1.4 10*3/uL (ref 1.0–3.6)
Lymphocyte %: 19.9 %
MCH: 29.2 pg (ref 26.0–34.0)
MCHC: 33.9 g/dL (ref 32.0–36.0)
MCV: 86 fL (ref 80–100)
MONO ABS: 0.9 x10 3/mm (ref 0.2–0.9)
Monocyte %: 13.1 %
NEUTROS ABS: 4.6 10*3/uL (ref 1.4–6.5)
Neutrophil %: 64.7 %
Platelet: 150 10*3/uL (ref 150–440)
RBC: 2.77 10*6/uL — AB (ref 3.80–5.20)
RDW: 16 % — AB (ref 11.5–14.5)
WBC: 7.2 10*3/uL (ref 3.6–11.0)

## 2013-07-16 LAB — HEMOGLOBIN A1C: Hemoglobin A1C: 11.8 % — ABNORMAL HIGH (ref 4.2–6.3)

## 2013-07-18 LAB — HCG, QUANTITATIVE, PREGNANCY

## 2013-07-19 LAB — BASIC METABOLIC PANEL
Anion Gap: 8 (ref 7–16)
BUN: 44 mg/dL — AB (ref 7–18)
Calcium, Total: 8.7 mg/dL (ref 8.5–10.1)
Chloride: 98 mmol/L (ref 98–107)
Co2: 29 mmol/L (ref 21–32)
Creatinine: 9.37 mg/dL — ABNORMAL HIGH (ref 0.60–1.30)
GFR CALC AF AMER: 6 — AB
GFR CALC NON AF AMER: 5 — AB
Glucose: 160 mg/dL — ABNORMAL HIGH (ref 65–99)
OSMOLALITY: 285 (ref 275–301)
Potassium: 5 mmol/L (ref 3.5–5.1)
Sodium: 135 mmol/L — ABNORMAL LOW (ref 136–145)

## 2013-07-19 LAB — CBC WITH DIFFERENTIAL/PLATELET
BASOS PCT: 0.5 %
Basophil #: 0 10*3/uL (ref 0.0–0.1)
EOS PCT: 3.8 %
Eosinophil #: 0.2 10*3/uL (ref 0.0–0.7)
HCT: 21.8 % — ABNORMAL LOW (ref 35.0–47.0)
HGB: 7.3 g/dL — AB (ref 12.0–16.0)
LYMPHS ABS: 1.2 10*3/uL (ref 1.0–3.6)
Lymphocyte %: 21.1 %
MCH: 28.8 pg (ref 26.0–34.0)
MCHC: 33.3 g/dL (ref 32.0–36.0)
MCV: 87 fL (ref 80–100)
MONOS PCT: 11.6 %
Monocyte #: 0.7 x10 3/mm (ref 0.2–0.9)
Neutrophil #: 3.6 10*3/uL (ref 1.4–6.5)
Neutrophil %: 63 %
PLATELETS: 162 10*3/uL (ref 150–440)
RBC: 2.52 10*6/uL — ABNORMAL LOW (ref 3.80–5.20)
RDW: 16 % — ABNORMAL HIGH (ref 11.5–14.5)
WBC: 5.7 10*3/uL (ref 3.6–11.0)

## 2013-07-20 LAB — HEMOGLOBIN: HGB: 7.1 g/dL — ABNORMAL LOW (ref 12.0–16.0)

## 2013-07-20 LAB — CULTURE, BLOOD (SINGLE)

## 2013-07-20 LAB — PHOSPHORUS: PHOSPHORUS: 8.2 mg/dL — AB (ref 2.5–4.9)

## 2013-07-21 LAB — CULTURE, BLOOD (SINGLE)

## 2013-07-22 LAB — PATHOLOGY REPORT

## 2013-07-23 LAB — WOUND CULTURE

## 2013-09-13 ENCOUNTER — Ambulatory Visit: Payer: Self-pay | Admitting: Vascular Surgery

## 2013-11-23 IMAGING — XA IR VASCULAR PROCEDURE
6 series · 13 of 13 positions shown · IV contrast (IODINE)
Comparison: none

[Series 1: care upper arm · 3 of 3 slices shown (1 of 6)]
[im 1/3]
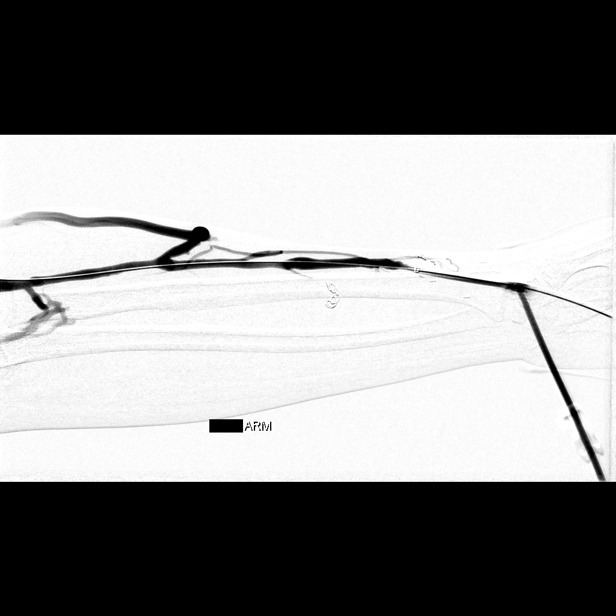
[im 2/3]
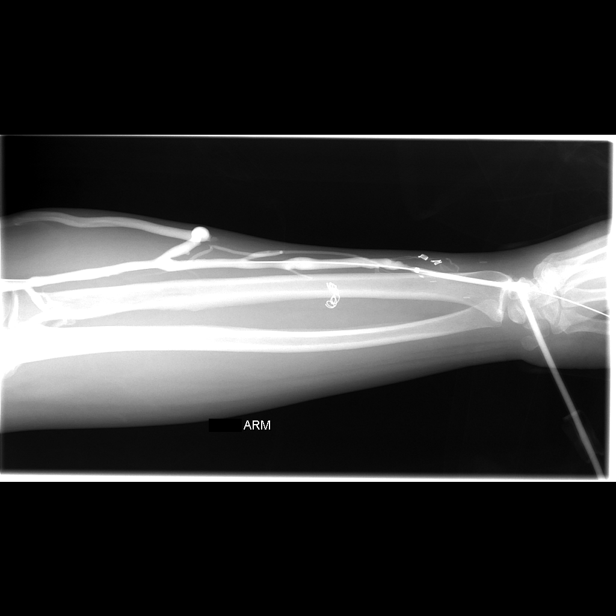
[im 3/3]
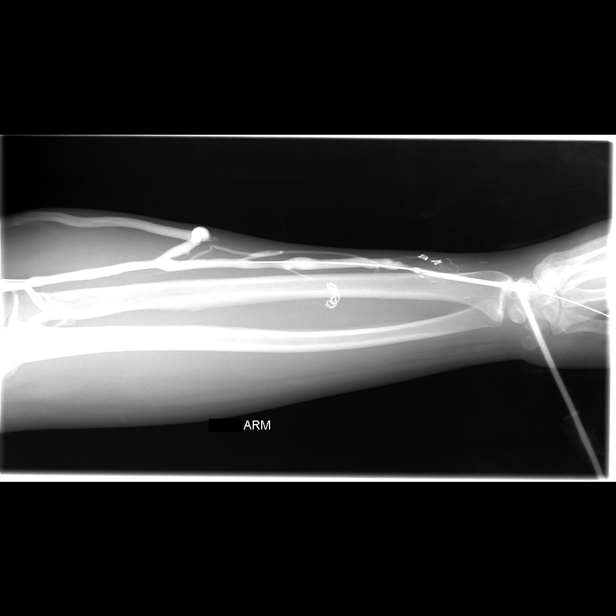

[Series 2: care upper arm · 2 of 2 slices shown (2 of 6)]
[im 1/2]
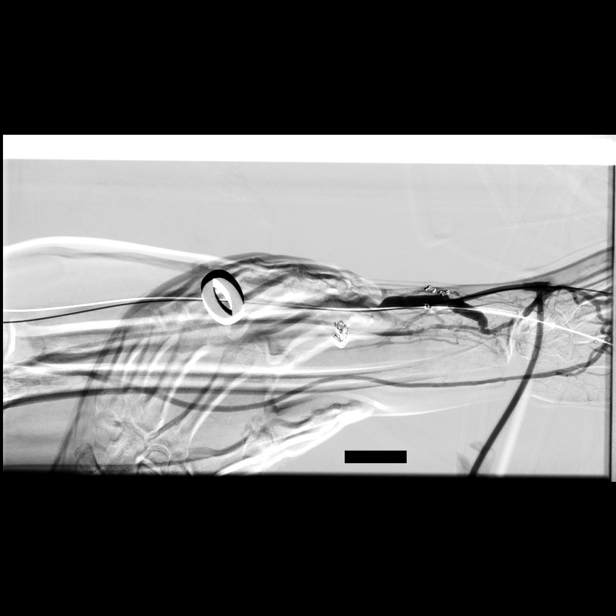
[im 2/2]
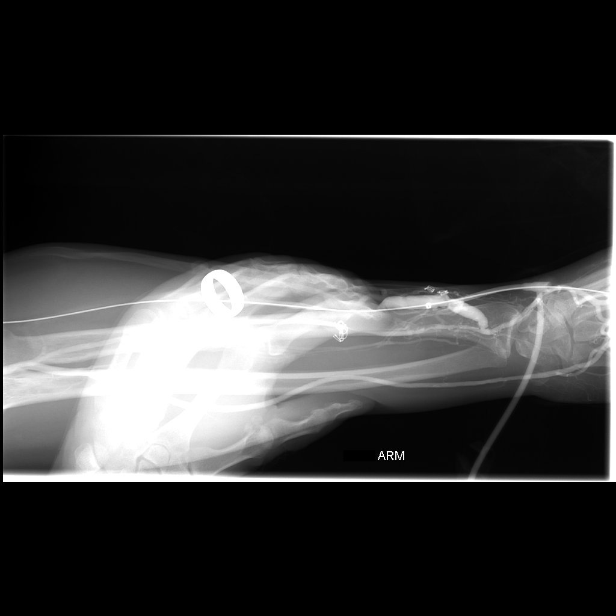

[Series 3: care upper arm · 2 of 2 slices shown (3 of 6)]
[im 1/2]
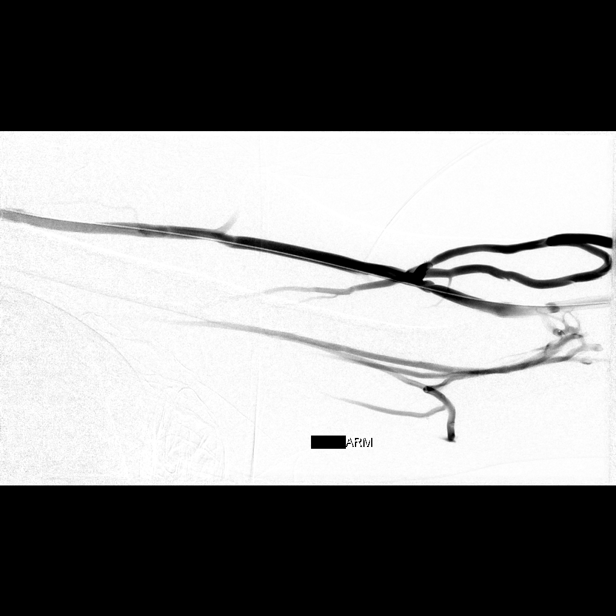
[im 2/2]
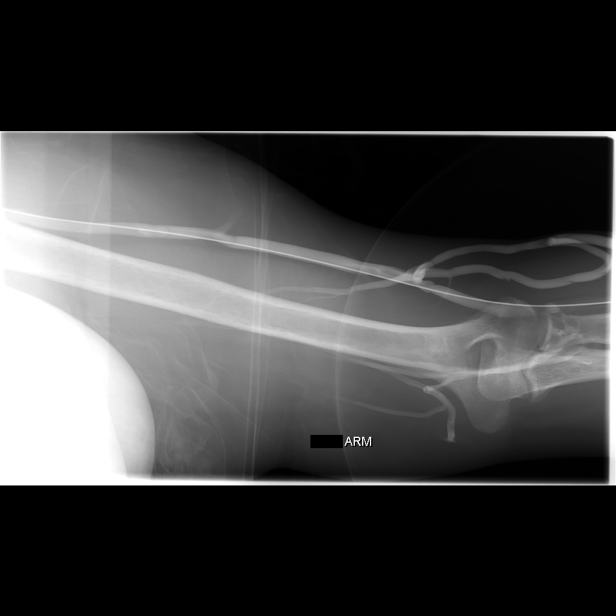

[Series 4: care upper arm · 2 of 2 slices shown (4 of 6)]
[im 1/2]
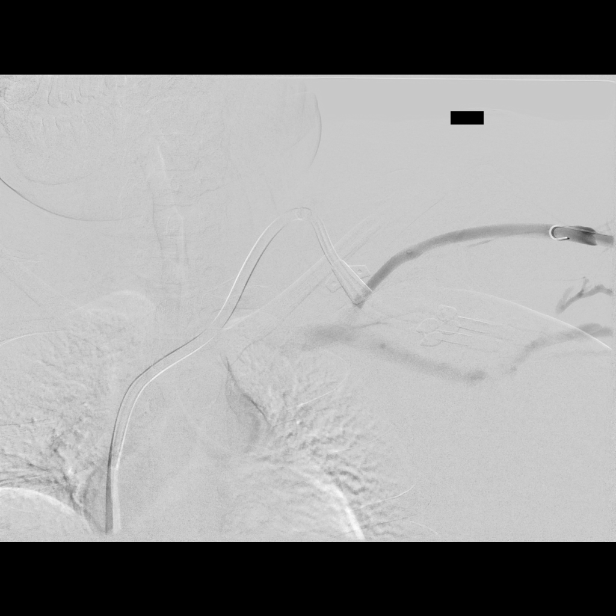
[im 2/2]
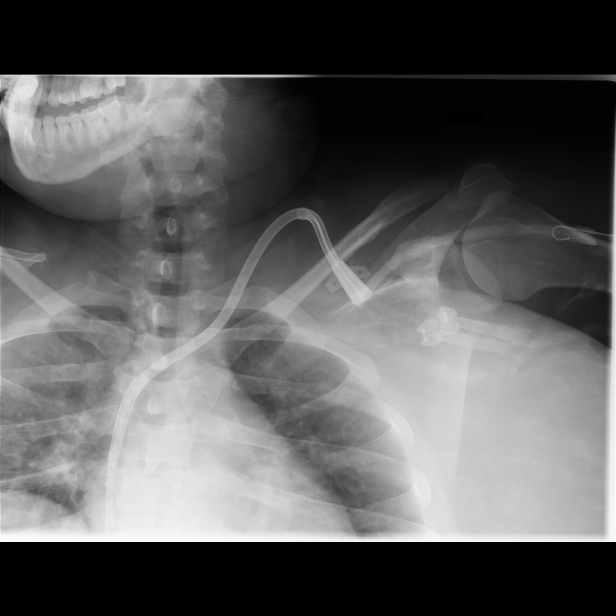

[Series 5: care upper arm · 2 of 2 slices shown (5 of 6)]
[im 1/2]
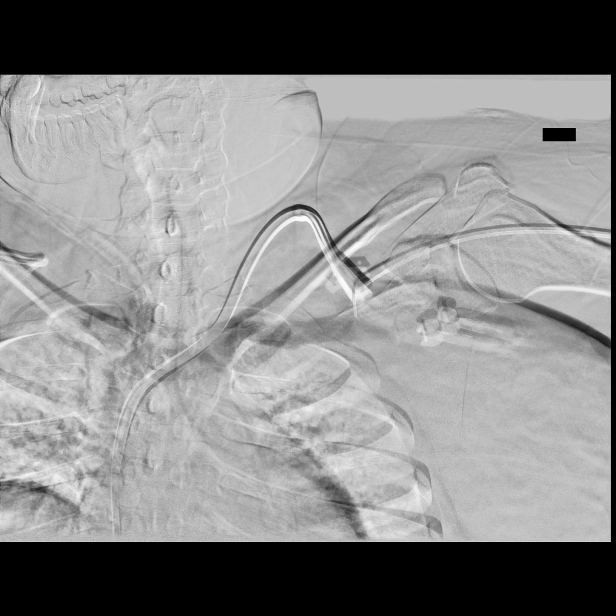
[im 2/2]
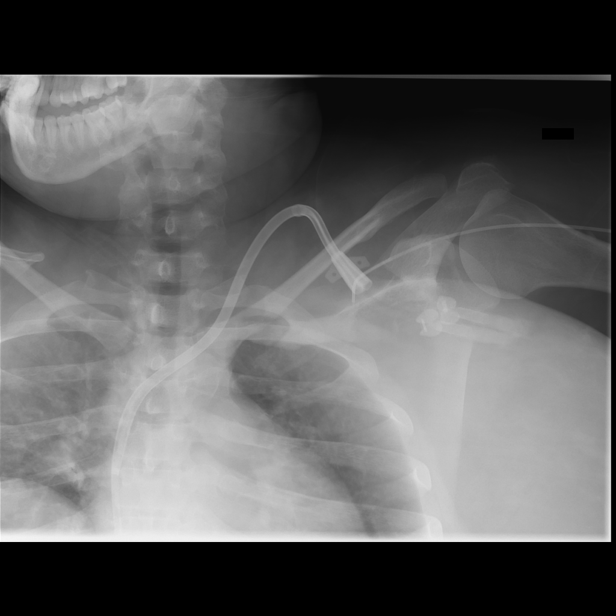

[Series 6: care upper arm · 2 of 2 slices shown (6 of 6)]
[im 1/2]
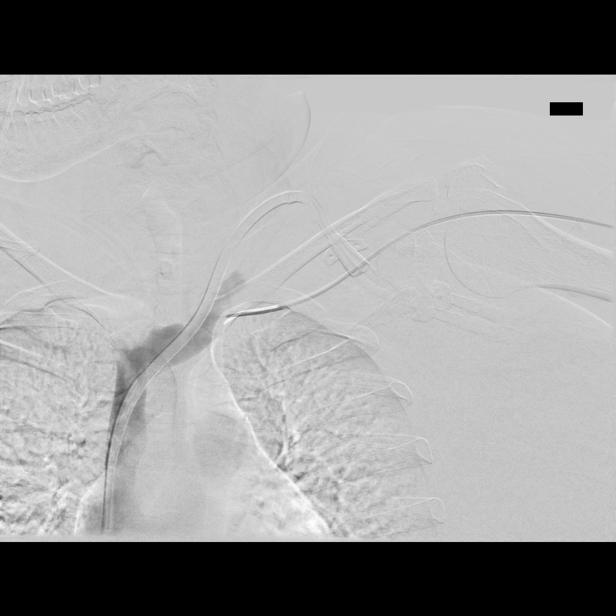
[im 2/2]
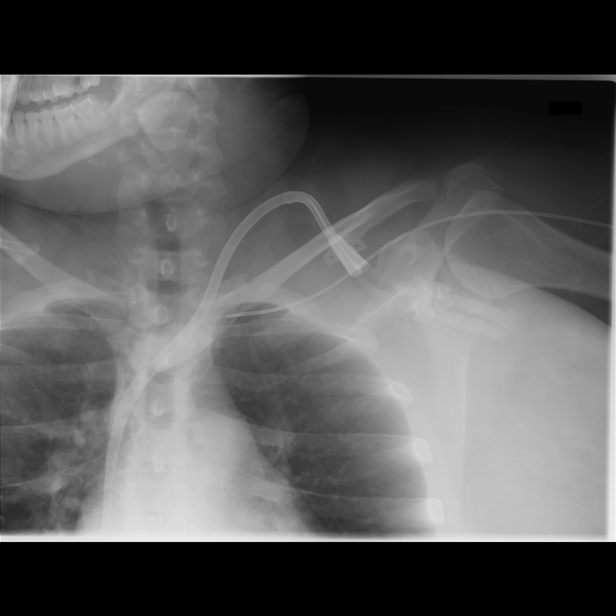

[13 of 13 positions shown; findings below may reference images not displayed]

IMAGES IMPORTED FROM THE SYNGO WORKFLOW SYSTEM
NO DICTATION FOR STUDY

## 2013-12-15 IMAGING — CR DG CHEST 2V
1 series · 2 of 2 positions shown · non-contrast
Comparison: none

REASON FOR EXAM: chest pain
COMMENTS:   May transport without cardiac monitor

[Series 1: w chest pa · 0.14mm/px · 2 of 2 slices shown]
[im 1/2]
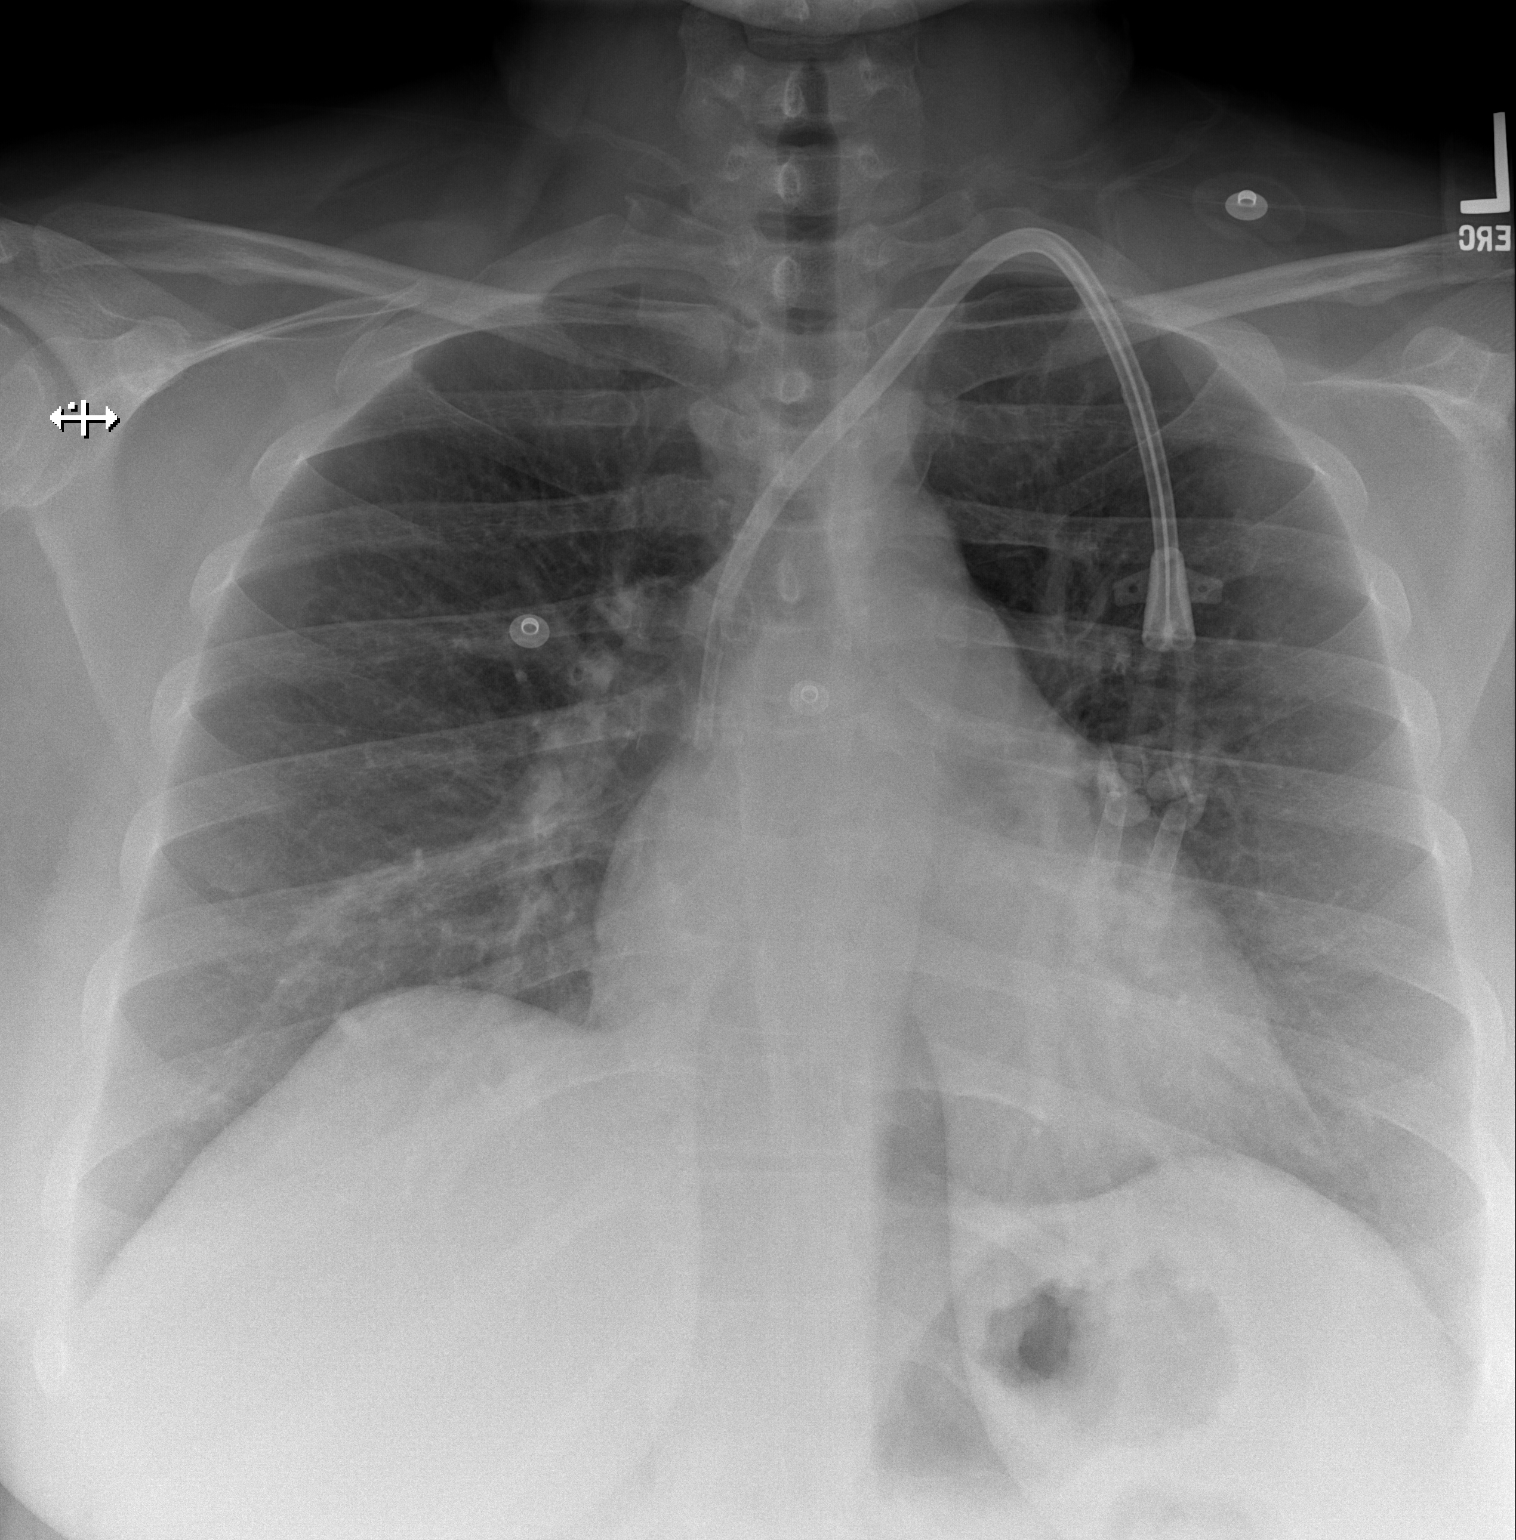
[im 2/2]
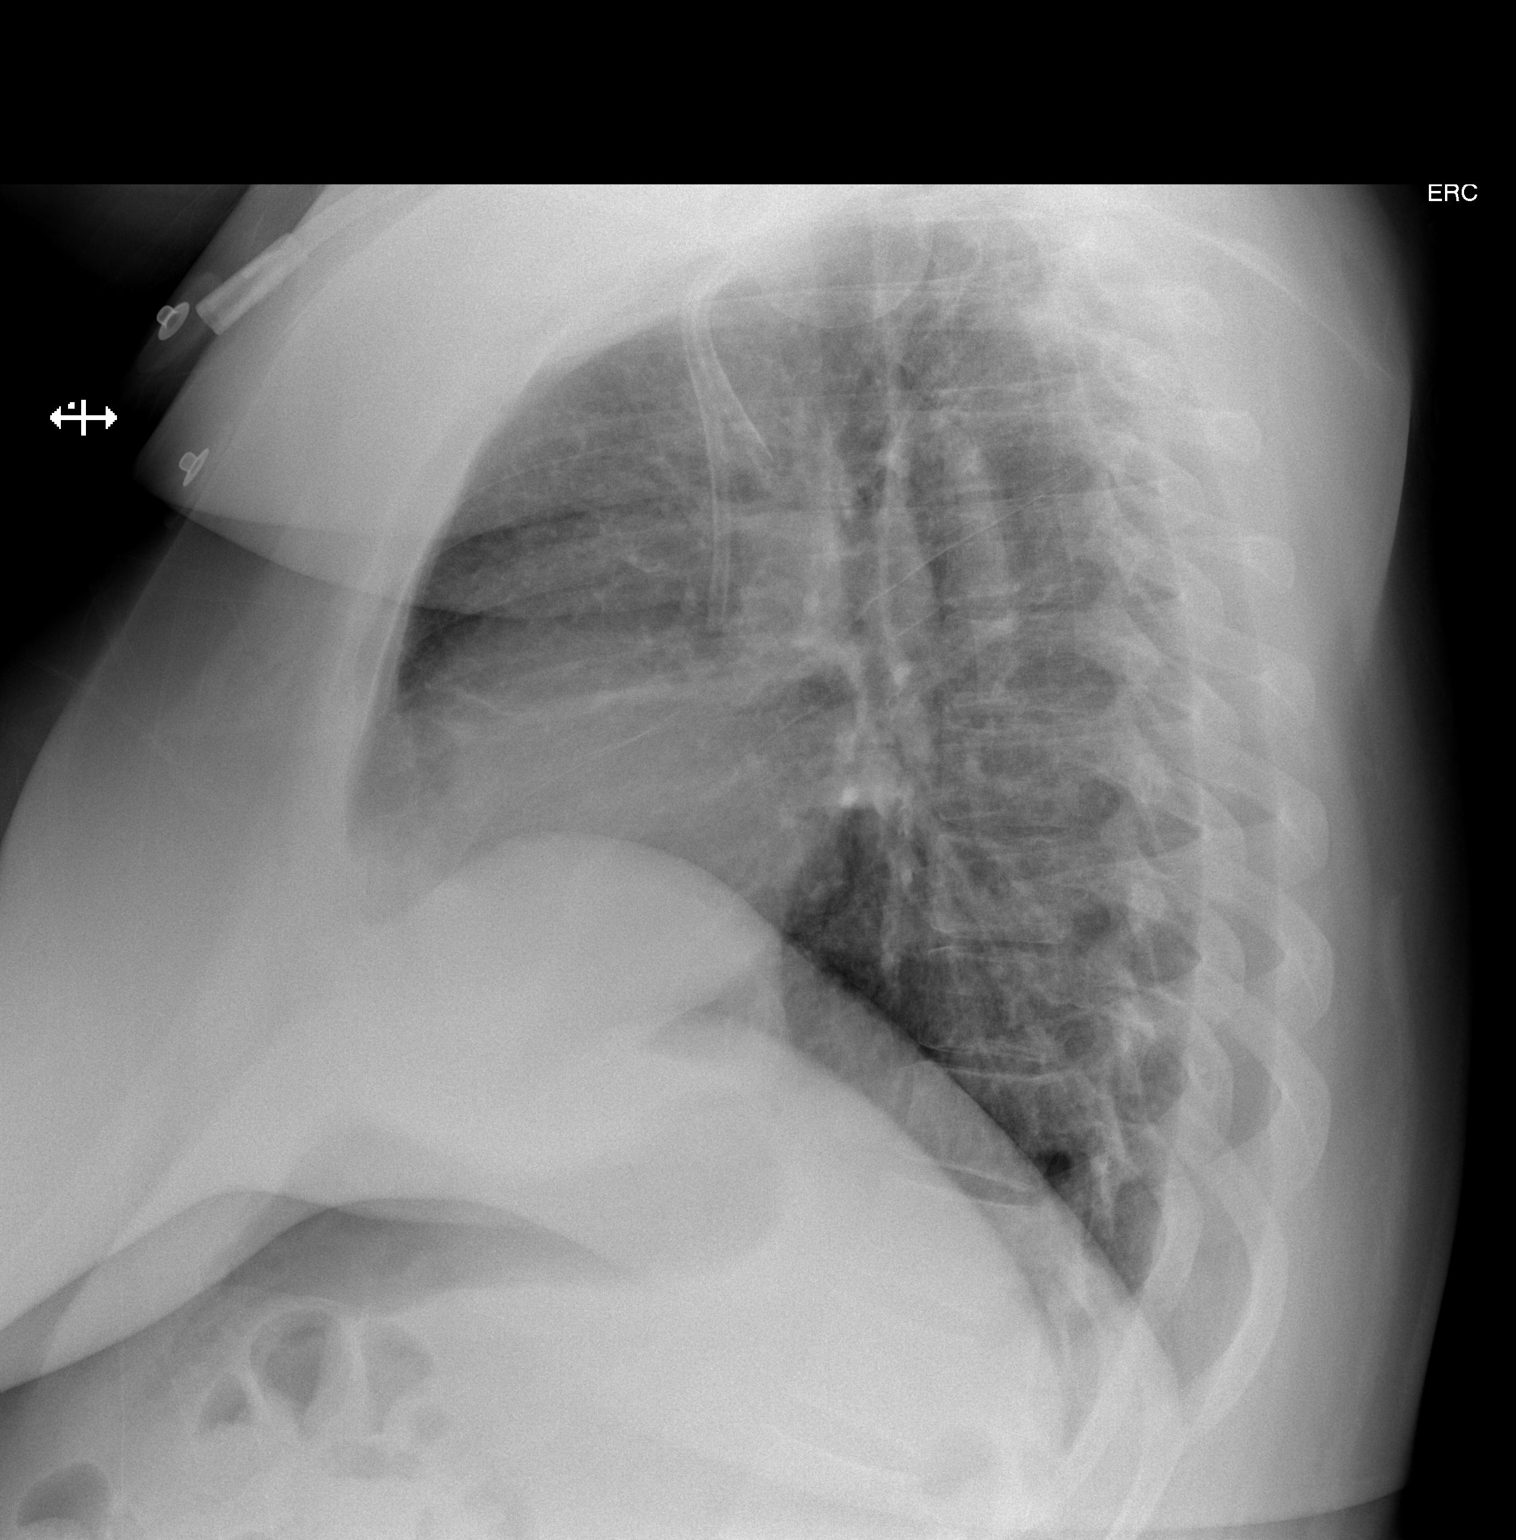

[2 of 2 positions shown; findings below may reference images not displayed]

PROCEDURE:     DXR - DXR CHEST PA (OR AP) AND LATERAL  - March 23, 2011 [DATE]

RESULT:

There is minimal atelectasis in the right middle lobe. The lung fields
otherwise are clear. No acute changes of the heart or pulmonary vasculature
are identified. A dual lumen venous catheter is present. No pneumothorax or
pleural effusion is seen.
IMPRESSION: 1.  There is minimal atelectasis in the right middle lobe.
2.  A dual lumen venous catheter is present.

## 2014-02-03 ENCOUNTER — Ambulatory Visit: Payer: Self-pay | Admitting: Vascular Surgery

## 2014-02-03 LAB — BASIC METABOLIC PANEL
Anion Gap: 14 (ref 7–16)
BUN: 58 mg/dL — ABNORMAL HIGH (ref 7–18)
Calcium, Total: 8.4 mg/dL — ABNORMAL LOW (ref 8.5–10.1)
Chloride: 93 mmol/L — ABNORMAL LOW (ref 98–107)
Co2: 25 mmol/L (ref 21–32)
Creatinine: 11.14 mg/dL — ABNORMAL HIGH (ref 0.60–1.30)
EGFR (African American): 5 — ABNORMAL LOW
GFR CALC NON AF AMER: 4 — AB
GLUCOSE: 258 mg/dL — AB (ref 65–99)
OSMOLALITY: 290 (ref 275–301)
POTASSIUM: 4.7 mmol/L (ref 3.5–5.1)
Sodium: 132 mmol/L — ABNORMAL LOW (ref 136–145)

## 2014-02-03 LAB — HCG, QUANTITATIVE, PREGNANCY: Beta Hcg, Quant.: 2 m[IU]/mL

## 2014-04-18 IMAGING — XA IR VASCULAR PROCEDURE
8 series · 13 of 13 positions shown · IV contrast (IODINE)
Comparison: none

[Series 1: forearm · 2 of 2 slices shown (1 of 5)]
[im 1/2]
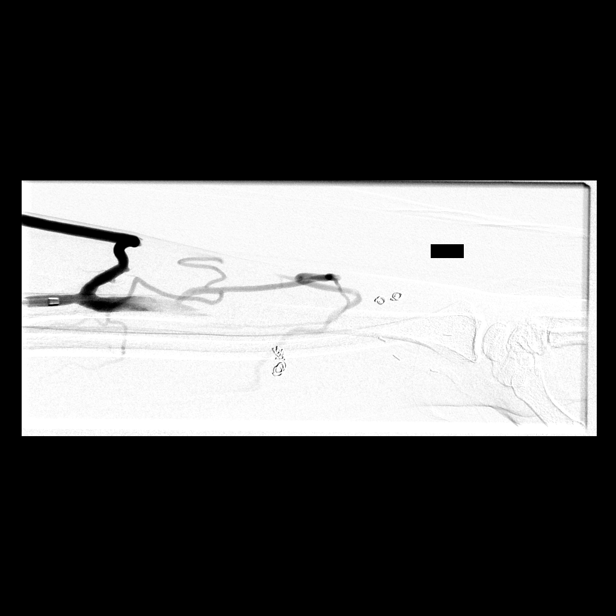
[im 2/2]
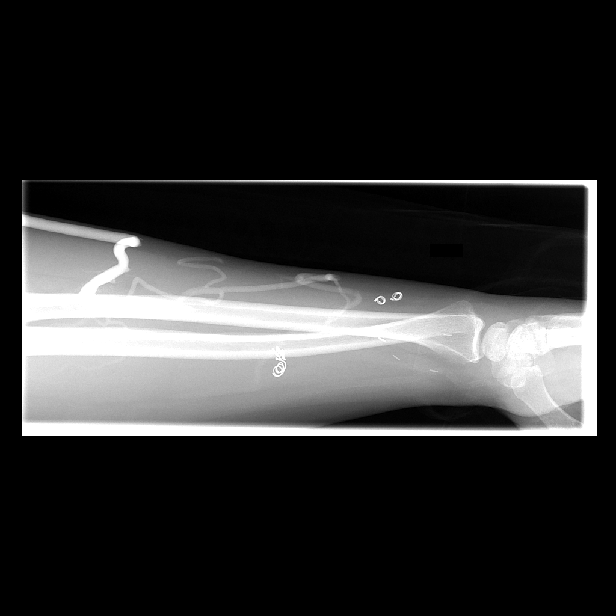

[Series 2: forearm · 2 of 2 slices shown (2 of 5)]
[im 1/2]
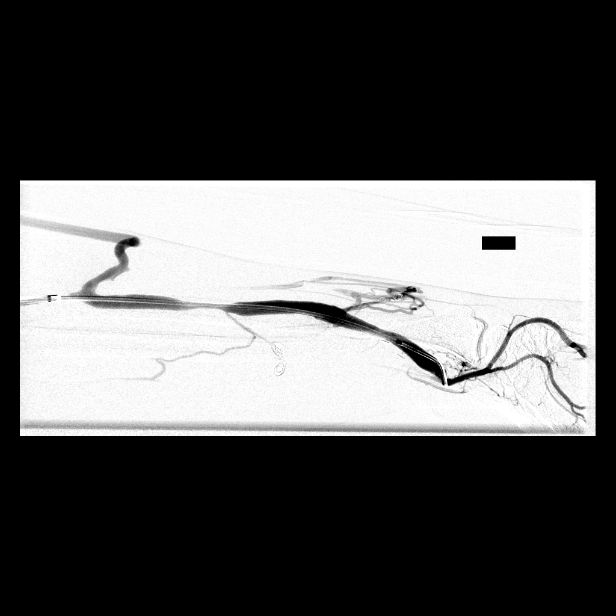
[im 2/2]
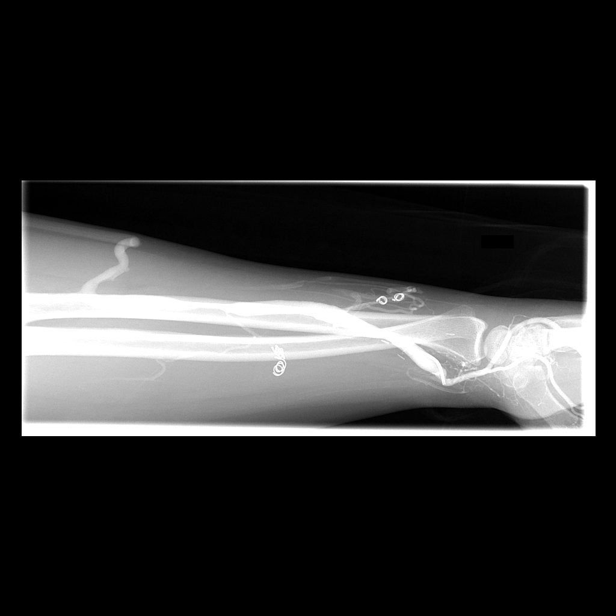

[Series 3: fl - angio · 1 of 1 slices shown (1 of 3)]
[im 1/1]
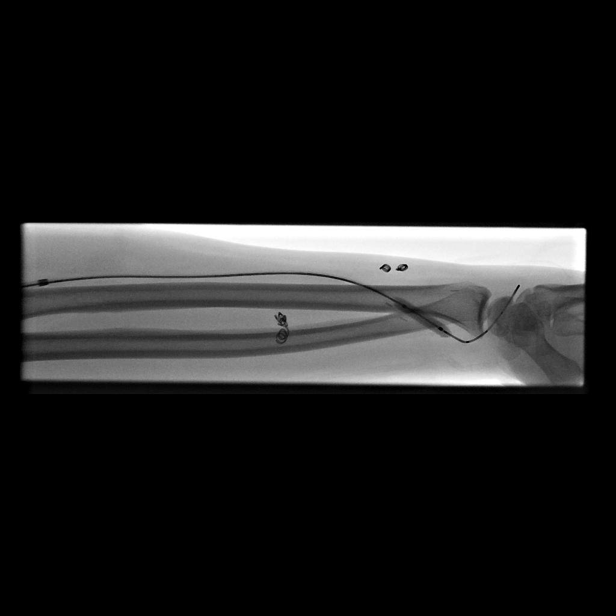

[Series 4: fl - angio · 1 of 1 slices shown (2 of 3)]
[im 1/1]
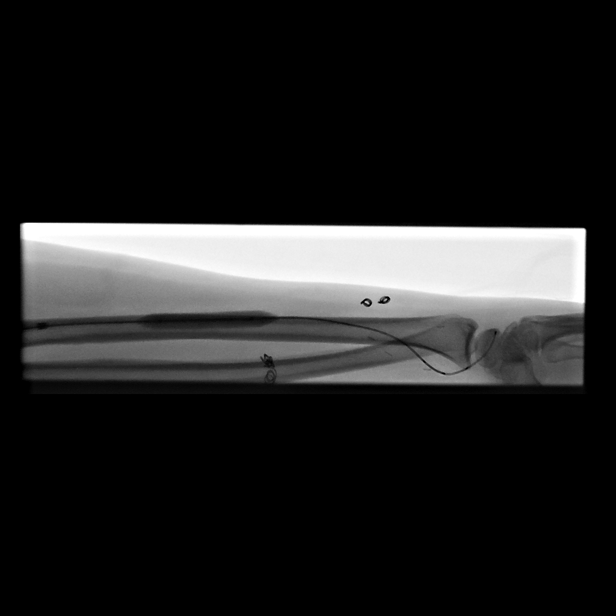

[Series 5: forearm · 2 of 2 slices shown (3 of 5)]
[im 1/2]
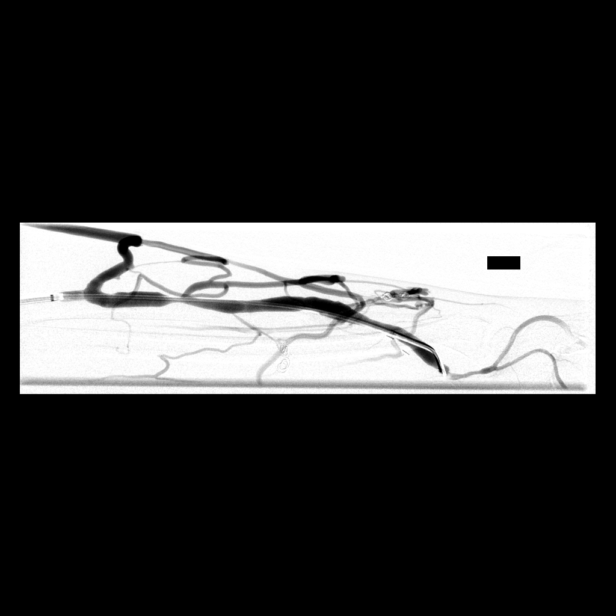
[im 2/2]
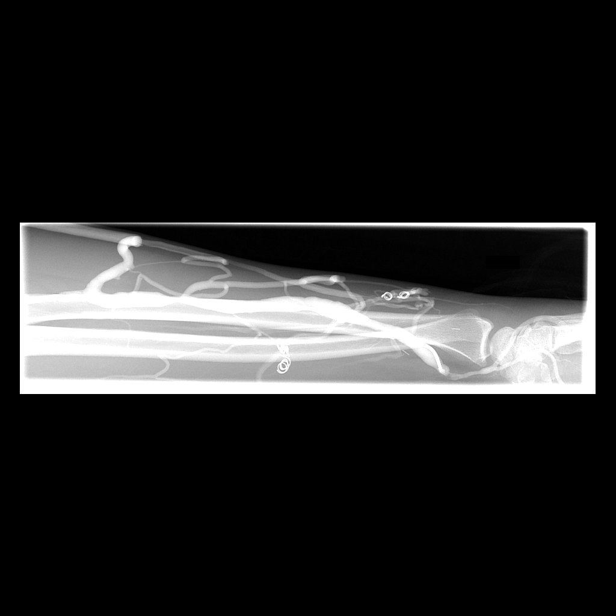

[Series 6: fl - angio · 1 of 1 slices shown (3 of 3)]
[im 1/1]
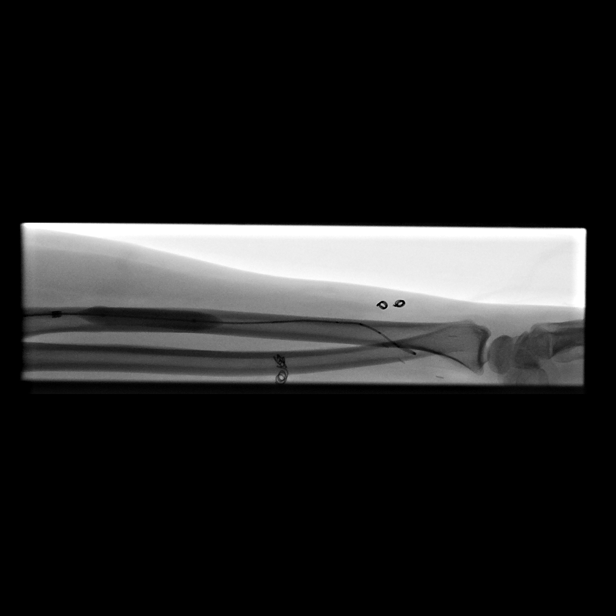

[Series 7: forearm · 2 of 2 slices shown (4 of 5)]
[im 1/2]
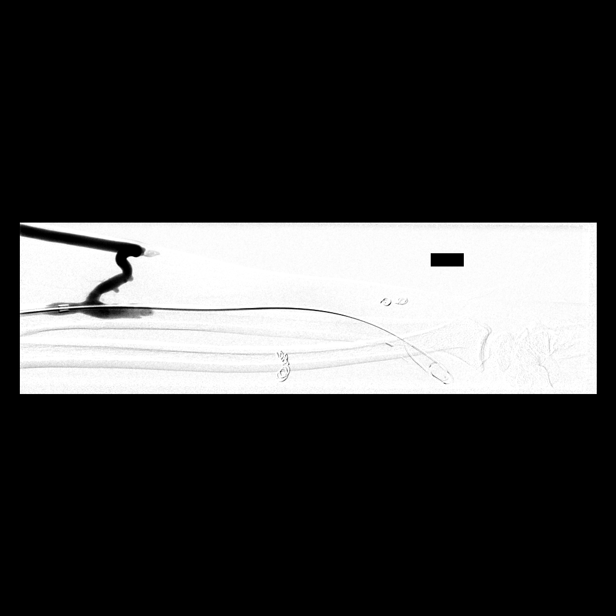
[im 2/2]
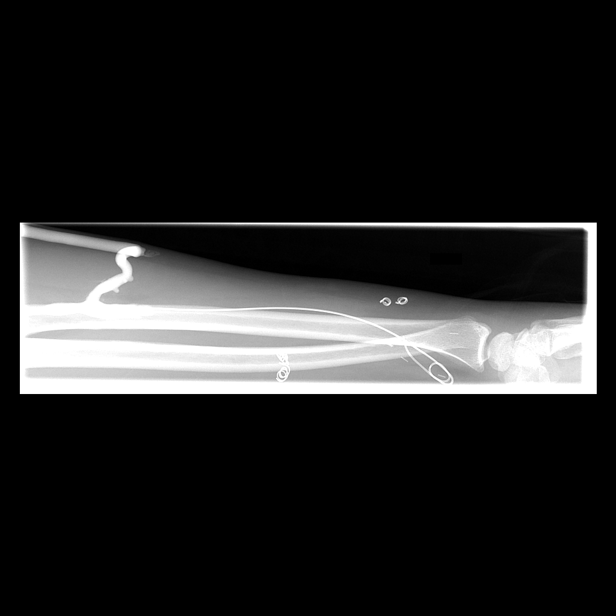

[Series 8: forearm · 2 of 2 slices shown (5 of 5)]
[im 1/2]
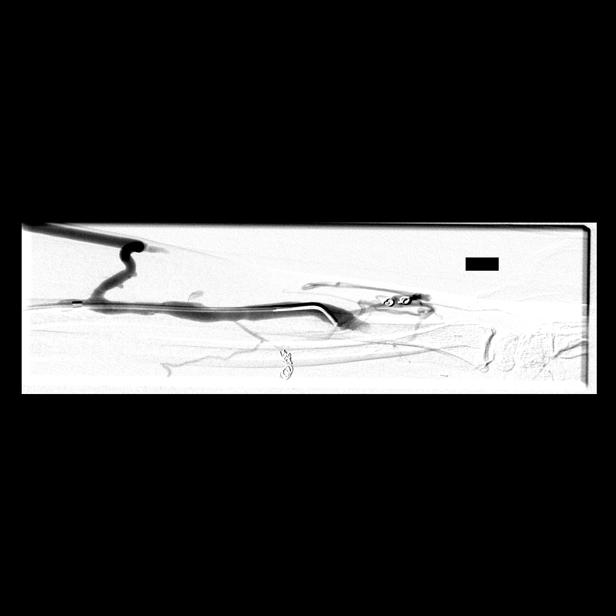
[im 2/2]
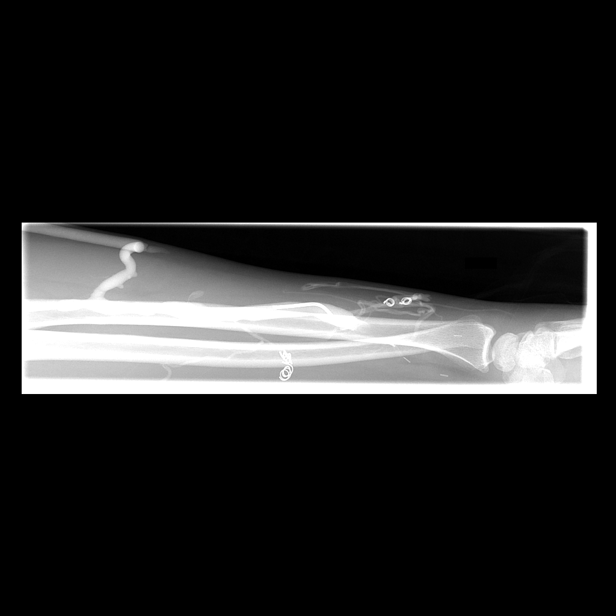

[13 of 13 positions shown; findings below may reference images not displayed]

IMAGES IMPORTED FROM THE SYNGO WORKFLOW SYSTEM
NO DICTATION FOR STUDY

## 2014-05-20 NOTE — Op Note (Signed)
PATIENT NAME:  [name], ERTZ MR#:  [id number] DATE OF BIRTH:  08/04/1985  DATE OF PROCEDURE:  09/05/2011  PREOPERATIVE DIAGNOSES:  1. End-stage renal disease.  2. Poorly functioning left arm arteriovenous fistula.  3. Hypertension.  4. Morbid obesity.  5. Diabetes.   POSTOPERATIVE DIAGNOSES:  1. End-stage renal disease.  2. Poorly functioning left arm arteriovenous fistula.  3. Hypertension.  4. Morbid obesity.  5. Diabetes.   PROCEDURE PERFORMED:   1. Ultrasound guidance for vascular access to left radial artery distal to the AV fistula.  2. Left upper extremity fistulogram.  3. Percutaneous transluminal angioplasty of radial artery at and just proximal to the arterial anastomosis with 4 mm diameter angioplasty balloon.  4. Percutaneous transluminal angioplasty of cephalic vein with a 7 mm diameter angioplasty balloon.   SURGEON: Algernon Huxley, MD   ANESTHESIA: Local with moderate conscious sedation.   ESTIMATED BLOOD LOSS: 25 mL.  CONTRAST USED: 45 mL Visipaque.   INDICATION FOR PROCEDURE: This is a 29 year old black female with renal failure. She has a poorly functioning left radiocephalic AV fistula. She is brought down for an attempt at revascularization of this fistula to improve its function. The risks and benefits were discussed. Informed consent was obtained.  DESCRIPTION OF PROCEDURE: The patient was brought to the Vascular Interventional Radiology Suite. The left upper extremity was sterilely prepped and draped, and a sterile surgical field was created. We accessed the radial artery a couple of centimeters distal to the anastomosis with a micropuncture needle, and a micropuncture wire and sheath were then placed. A permanent image was recorded. Initially the wire tracked down the radial artery. Previous noninvasive studies had shown significant stenosis just proximal to the anastomosis within the radial artery. There was disease there. It was a little difficult to the  determine the degree of stenosis, but it did appear flow limiting. I put an 0.018 wire in and ballooned this area with a 4 mm diameter x 4 cm length angioplasty balloon. There was a waist that was taken, and completion angiogram showed this to be improved. The flow through the fistula was quite sluggish initially. A fistulogram showed that the cephalic vein was occluded in the forearm and she was draining through a branch. This was apparently what was being stuck for her buttonhole which would explain some of poor function and that it was not the true cephalic vein. I was able to cross the cephalic vein occlusion with a Terumo Advantage Wire and confirmed intraluminal flow distally in the cephalic vein above the antecubital fossa with a Kumpe catheter. I then replaced the wire. A 7 mm diameter angioplasty balloon was inflated. There was a tight waist taken which resolved with angioplasty to 14 atmospheres. Completion fistulogram now showed this area to be patent with some 20 to 30% residual stenosis that did not appear flow limiting. At this point I elected to terminate the procedure. The sheath was removed around a 4-0 Monocryl pursestring suture. Pressure was held. Sterile dressing was placed. The patient tolerated the procedure well and was taken to the recovery room in stable condition.    ____________________________ Algernon Huxley, MD jsd:[id number] D: 09/05/2011 12:18:21 ET T: 09/05/2011 12:58:44 ET JOB#: [id number]  cc: Algernon Huxley, MD, <Dictator>  Algernon Huxley MD ELECTRONICALLY SIGNED 09/07/2011 11:01

## 2014-05-20 NOTE — Op Note (Signed)
PATIENT NAME:  [name], [name] MR#:  [id number] DATE OF BIRTH:  [birth year]  DATE OF PROCEDURE:  10/12/2011  PREOPERATIVE DIAGNOSES:  1. Endstage renal disease.  2. Poorly functioning left arm AV fistula.  3. Hypertension.  4. Diabetes.   POSTOPERATIVE DIAGNOSES:  1. Endstage renal disease.  2. Poorly functioning left arm AV fistula.  3. Hypertension.  4. Diabetes.   PROCEDURE: 1. Ultrasound guidance for vascular access to left radiocephalic AV fistula.  2. Left upper extremity fistulogram.  3. Coil embolization of cephalic vein branches.   SURGEON: Algernon Huxley, M.D.   ANESTHESIA: Local with moderate conscious sedation.   ESTIMATED BLOOD LOSS: 25 mL.   CONTRAST USED: Approximately 60 mL Visipaque.   INDICATION FOR PROCEDURE: This is a 29 year old African American female with endstage renal disease. She has longstanding dialysis dependence. Her fistula has been functioning poorly. A noninvasive study showed an occlusion of her cephalic vein draining out of a large branch around the elbow and they have continued to use this fistula for dialysis. She is brought in for an attempt at improvement in the flow in the fistula. Risks and benefits were discussed. Informed consent was obtained.   DESCRIPTION OF PROCEDURE: The patient was brought to the vascular interventional radiology suite.  The left upper extremity was sterilely prepped and draped and a sterile surgical field was created. The fistula was accessed just beyond the anastomosis under direct ultrasound guidance with a micropuncture needle and permanent image was recorded. Micropuncture wire and sheath were placed. Imaging showed a moderate length occlusion cephalic vein at and just below the antecubital fossa, a large draining branch connected to the cephalic vein in the upper arm and was the outflow for the fistula. There were smaller medium-sized branches earlier off the cephalic vein which may limit the flow through this. I gave  the patient 3000 units of intravenous heparin for systemic anticoagulation. I attempted to cross the occlusion with a Glidewire, a Kumpe catheter, and a glide catheter. I used the Glidewire in a conventional fashion and even tried the back end of the Glidewire but was unable to cross this occlusion and gain intraluminal flow in the cephalic vein. When it was clear this was going to be a futile attempt, I had decided that the large branch would be large enough to support the fistula somewhat and I went to embolizing the smaller branches that would possibly limit its flow. These were cannulated with a rim catheter and a floppy Glidewire. A total of four 5 x 3 Tornado coils were deployed in these small to medium-sized branches with resultant limitations in blood flow going   out the branches. At this point I elected to terminate the procedure.  The diagnostic catheter was removed. A 4-0 Monocryl pursestring suture was placed around the sheath. The sheath was removed. Pressure was held. Sterile dressing was placed. The patient tolerated the procedure well and was taken to the recovery room in stable condition.    ____________________________ Algernon Huxley, MD jsd:bjt D: 10/12/2011 12:45:10 ET T: 10/12/2011 12:58:43 ET JOB#: [id number]  cc: Algernon Huxley, MD, <Dictator> Algernon Huxley MD ELECTRONICALLY SIGNED 10/17/2011 8:59

## 2014-05-23 NOTE — Op Note (Signed)
PATIENT NAME:  [name], [name] MR#:  [id number] DATE OF BIRTH:  28-Aug-1985  DATE OF PROCEDURE:  02/20/2012  PREOPERATIVE DIAGNOSES: 1. End-stage renal disease.  2. Poorly functioning left arm AV fistula.  3. Hypertension, poorly controlled.  4. Diabetes.   POSTOPERATIVE DIAGNOSES:  1. End-stage renal disease.  2. Poorly functioning left arm AV fistula.  3. Hypotension, poorly controlled.  4. Diabetes.   PROCEDURES PERFORMED: 1. Ultrasound guidance for vascular access to left radiocephalic AV fistula.  2. Left upper extremity fistulogram and central venogram.  SURGEON: Leotis Pain, MD  ANESTHESIA: Local with moderate conscious sedation.   ESTIMATED BLOOD LOSS: Minimal.   CONTRAST USED: 20 mL Visipaque.   FLUOROSCOPY TIME: Two minutes.   INDICATION FOR PROCEDURE: This is a 29 year old African American female with end-stage renal disease. We are asked to evaluate her fistula due to decreased flow. Risks and benefits were discussed and informed consent was obtained.   DESCRIPTION OF PROCEDURE: The patient is brought to the vascular interventional radiology suite. The left upper extremity is sterilely prepped and draped and a sterile surgical field was created. The fistula was accessed a couple of centimeters beyond the anastomosis with a micropuncture needle and under direct ultrasound guidance a permanent image was recorded and micropuncture wire and sheath were placed. Imaging was all performed through the micropuncture sheath. This showed the initial 10 to 12 cm of the radiocephalic AV fistula to be widely patent. The anastomosis was seen with compression of the fistula. There was a large draining branch about halfway down the forearm but then drained around the antecubital fossa and joined the cephalic vein in the upper arm. The cephalic vein itself was occluded, in the proximal forearm, which was known from previous studies. The remainder of the central venous circulation appeared  patent. In the upper arm, circulation was patent. At this point, the main branch seemed to be doing much of the draining, the smaller branches were not limiting at this point, and so I did not consider any embolization. At this point, I elected to terminate the procedure. The sheath was removed around a 4-0 Monocryl pursestring suture, pressure was held and a sterile dressing was placed. The patient tolerated the procedure well and was taken to the recovery room in stable fashion.  ____________________________ Algernon Huxley, MD jsd:sb D: 02/20/2012 12:13:06 ET T: 02/20/2012 12:34:32 ET JOB#: [id number]  cc: Algernon Huxley, MD, <Dictator> Algernon Huxley MD ELECTRONICALLY SIGNED 02/23/2012 9:10

## 2014-05-23 NOTE — Op Note (Signed)
PATIENT NAME:  [name], [name] MR#:  [id number] DATE OF BIRTH:  Jun 19, 1985  DATE OF PROCEDURE:  05/23/2012  PREOPERATIVE DIAGNOSES: 1.  End-stage renal disease.  2.  Severe hypertension.  3.  Morbid obesity.  4.  Diabetes.  5.  Decreased flow left arm arteriovenous fistula.   POSTOPERATIVE DIAGNOSES: 1.  End-stage renal disease.  2.  Severe hypertension.  3.  Morbid obesity.  4.  Diabetes.  5.  Decreased flow left arm arteriovenous fistula.   PROCEDURES PERFORMED: 1.  Ultrasound guidance for vascular access, right femoral artery.  2.  Catheter placement into left radial artery from right femoral approach.  3.  Thoracic aortogram selective left upper extremity angiogram.  4. Percutaneous transluminal angioplasty of left radial artery at and just proximal to the anastomosis with a 4 mm diameter angioplasty balloon.  5.  StarClose closure device, right femoral artery.   SURGEON:  Algernon Huxley, M.D.   ANESTHESIA:  Local with moderate conscious sedation.   ESTIMATED BLOOD LOSS:  25 mL  INDICATION FOR PROCEDURE: A 29 year old [place] American female with multiple ongoing issues and end-stage renal disease. She has a left radiocephalic AV fistula. The cephalic vein occludes in the proximal forearm, but drains well through a collateral branch to the upper arm and this has been a functional access for many months. Recently her flows are diminished and there was some elevated velocity seen in the radial artery proximally and anastomosis on noninvasive studies. She is brought in for angiography for further evaluation and possible treatment. Risks and benefits are discussed. Informed consent is obtained.   DESCRIPTION OF PROCEDURE: The patient is brought to the vascular and interventional radiology suite due to poor body habitus. Ultrasound was used to visualize a patent right femoral artery. This is accessed under direct ultrasound guidance without difficulty with a Seldinger needle. A J-wire  and 5-French sheath were placed. 3000 units of intravenous heparin was given for systemic anticoagulation and a pigtail catheter was placed in the ascending aorta. An LAO projection aortogram was performed. This demonstrated normal origins of the great vessels. I then used a Headhunter catheter to cannulate the left subclavian artery and imaging was then performed. The left subclavian artery and brachial artery were patent. There were some mild irregularities and the brachial arteries are not flow limiting. She then had a normal brachial bifurcation to the radial and ulnar arteries. The catheter was advanced down to the proximal radial artery and imaging was performed. This demonstrated what appeared to be a moderate area of stenosis maybe at the clamp site of her radiocephalic AV fistula creation. I was able to cross the lesion without difficulty with an 0.018 wire. This area was treated with a 4 mm diameter angioplasty balloon. Nitroglycerin was given intra-arterially for postangioplasty vasospasm. Completion of angiogram showed good flow through the radial artery with mild residual stenosis that did not appear to be more than 20% to 25% and was not flow limiting. At this point, I elected to terminate the procedure. The oblique arteriogram was performed of the right femoral artery and StarClose closure device was deployed in the usual fashion with excellent hemostatic result. The patient tolerated the procedure well and was taken to the recovery room in stable condition.    ____________________________ Algernon Huxley, MD jsd:ce D: 05/23/2012 10:35:58 ET T: 05/23/2012 10:54:58 ET JOB#: [id number]  cc: Algernon Huxley, MD, <Dictator> Algernon Huxley MD ELECTRONICALLY SIGNED 05/28/2012 9:28

## 2014-05-23 NOTE — Op Note (Signed)
PATIENT NAME:  [name], [name] MR#:  [id number] DATE OF BIRTH:  Dec 16, 1985  DATE OF PROCEDURE:  10/08/2012  PREOPERATIVE DIAGNOSES: 1.  End-stage renal disease with diminished function of left arm AV fistula with noninvasive study suggesting arterial stenosis proximal to the fistula.  2.  Poorly controlled hypertension.  3.  Diabetes.   POSTOPERATIVE DIAGNOSES:  1.  End-stage renal disease with diminished function of left arm AV fistula with noninvasive study suggesting arterial stenosis proximal to the fistula.  2.  Poorly controlled hypertension.  3.  Diabetes.   PROCEDURES: 1.  Ultrasound guidance for vascular access, right femoral artery.  2.  Catheter placement into left brachial artery from right femoral approach.  3.  Thoracic aortogram and selective left upper extremity angiogram.  4.  StarClose closure device, right femoral artery.   SURGEON: Algernon Huxley, M.D.   ANESTHESIA: Local with moderate conscious sedation.   ESTIMATED BLOOD LOSS: 25 mL.   INDICATION FOR PROCEDURE: This is a 29 year old African American female on dialysis. Her fistula flows have been poor. Noninvasive study showed suspected arterial stenosis in the radial artery proximal to the fistula. She is brought in for angiography for further evaluation of this and potential treatment if this is the case to improve her fistula function. The risks and benefits were discussed. Informed consent was obtained.   DESCRIPTION OF PROCEDURE: The patient was brought to the vascular and interventional radiology suite. Groins were shaved and prepped and a sterile surgical field was created. Due to her body habitus ultrasound was used to visualize the femoral artery which was found to be patent. It was accessed under direct ultrasound guidance without difficulty with a Seldinger needle. A J-wire and 5-French sheath were placed. Pigtail catheter was then placed in the ascending aorta in LAO projection and aortogram was performed.  This showed normal origins of the great vessels with near bovine configuration of the innominate and left common carotid arteries. The left subclavian artery was then cannulated without difficulty with a headhunter catheter and a glidewire. Imaging was performed of the left subclavian, axillary and then brachial arteries and then the catheter was advanced down to the brachial artery near the bifurcation so that the opacification of the radial and ulnar arteries could be better seen. The patient was given 2500 units of intravenous heparin. Imaging showed no significant stenosis in the subclavian artery, axillary artery or brachial artery. There was a normal brachial bifurcation with a patent ulnar artery into the hand. The radial artery was patent to the level of the fistula and there was reverse flow in the radial artery distal to the fistula through the palmar arch and back into the fistula. The fistula anastomosis itself appeared patent without significant stenosis. As previously known, the cephalic vein occluded in the proximal forearm. She had a large collateral of the cephalic vein which then fed up to the cephalic vein in the upper arm keeping the fistula patent and usable. The only abnormality in the radial artery appeared to be a less than 25% stenosis about 4 to 5 cm proximal to the anastomosis that was clearly not flow limiting. Multiple obliquities were performed to ensure that we were not missing any stenosis. Once it was clear that there was excellent flow through the radial artery, I elected to terminate the procedure. The diagnostic catheter was removed, oblique arteriogram was performed of the right femoral artery and StarClose closure device was deployed in the usual fashion with excellent hemostatic result. The patient tolerated  the procedure well and was taken to the recovery room in stable condition.  ____________________________ Algernon Huxley, MD jsd:sb D: 10/08/2012 14:19:36  ET T: 10/08/2012 15:21:27 ET JOB#: [id number]  cc: Algernon Huxley, MD, <Dictator> Algernon Huxley MD ELECTRONICALLY SIGNED 10/24/2012 11:40

## 2014-05-24 NOTE — Consult Note (Signed)
CHIEF COMPLAINT and HISTORY:  Subjective/Chief Complaint Fever and chills Consulted for temporary dialysis catheter   History of Present Illness The patient is a 29 year old female who has history of end-stage renal disease on hemodialysis, hypertension, diabetes, and seizure disorder who has 2 day history of weakness, fever, chills, nausea. Per documentation in the ER, she was found having symptoms of sepsis with elevated heart rate and mild hypotension with fever of 102 degrees Fahrenheit. Her hemodialysis access found to be thrombosed, and she is due for dialysis today.  We were consulted for placement of temporary hemodialysis catheter.   PAST MEDICAL/SURGICAL HISTORY:  Past Medical History:   Hypertension:    Kidney Failure:    Hypercholesterolemia:    Diabetes:   ALLERGIES:  Allergies:  Vancomycin HCl: Blisters, Itching  Latex: Rash  HOME MEDICATIONS:  Home Medications: Medication Instructions Status  calcium acetate 667 mg oral capsule 2 cap(s) orally 3 times a day (with meals) and 1 capsule with snacks Active  Keppra 750 mg oral tablet 1 tab(s) orally once a day (at bedtime) Active  busPIRone 10 mg oral tablet 1 tab(s) orally once a day (at bedtime) Active  metoprolol succinate 50 mg oral tablet, extended release 1 tab(s) orally once a day (at bedtime) Active   Family and Social History:  Family History Diabetes Mellitus   Social History positive  tobacco, negative ETOH, negative Illicit drugs, occasional tobacco   Review of Systems:  Fever/Chills Yes   Cough No   Sputum No   Abdominal Pain No   Nausea/Vomiting Yes   SOB/DOE No   Chest Pain No   Physical Exam:  GEN no acute distress, shivering   HEENT PERRL, hearing intact to voice   NECK supple  No masses   RESP normal resp effort  clear BS   CARD regular rate   VASCULAR ACCESS No bruit, no thrill  AV access present   ABD denies tenderness  normal BS   LYMPH negative neck   EXTR  negative cyanosis/clubbing   SKIN normal to palpation, No rashes   NEURO cranial nerves intact   PSYCH alert, A+O to time, place, person   LABS:  Laboratory Results: Hepatic:    29-Jan-15 05:01, Comprehensive Metabolic Panel  Bilirubin, Total 0.5  Alkaline Phosphatase 42  45-117  NOTE: New Reference Range  12/21/12  SGPT (ALT) < 6  SGOT (AST) 16  Total Protein, Serum 7.8  Albumin, Serum 3.1  Routine Micro:    29-Jan-15 05:01, Influenza A + B Antigen ([facility])  Micro Text Report   INFLUENZA A+B ANTIGENS    COMMENT                   NEGATIVE FOR INFLUENZA A (ANTIGEN ABSENT)    COMMENT                   NEGATIVE FOR INFLUENZA B (ANTIGEN ABSENT)     ANTIBIOTIC  Comment 1..   NEGATIVE FOR INFLUENZA A (ANTIGEN ABSENT) A negative result does not exclude influenza.  Correlation with clinical impression is required.  Comment 2..   NEGATIVE FOR INFLUENZA B (ANTIGEN ABSENT)   Result(s) reported on 28 Feb 2013 at [facility].    29-Jan-15 06:40, Blood Culture  Micro Text Report   BLOOD CULTURE    COMMENT                   NO GROWTH IN 8-12 HOURS     ANTIBIOTIC  Specimen Source  right arm  Culture Comment   NO GROWTH IN 8-12 HOURS   Result(s) reported on 28 Feb 2013 at 03:00PM.  Micro Text Report   BLOOD CULTURE    COMMENT                   NO GROWTH IN 8-12 HOURS     ANTIBIOTIC  Specimen Source   right arm  Culture Comment   NO GROWTH IN 8-12 HOURS   Result(s) reported on 28 Feb 2013 at 03:00PM.  Routine Chem:    29-Jan-15 05:01, Comprehensive Metabolic Panel  Glucose, Serum 109  BUN 44  Creatinine (comp) 10.83  Sodium, Serum 131  Potassium, Serum 4.3  Chloride, Serum 94  CO2, Serum 33  Calcium (Total), Serum 9.2  Osmolality (calc) 274  eGFR (African American) 5  eGFR (Non-African American) 4  eGFR values <68m/min/1.73 m2 may be an indication of chronic  kidney disease (CKD).  Calculated eGFR is useful in patients with stable renal function.  The eGFR  calculation will not be reliable in acutely ill patients  when serum creatinine is changing rapidly. It is not useful in   patients on dialysis. The eGFR calculation may not be applicable  to patients at the low and high extremes of body sizes, pregnant  women, and vegetarians.  Anion Gap 4    29-Jan-15 18:00, Phosphorus, Serum  Phosphorus, Serum 4.8  Result(s) reported on 28 Feb 2013 at 06:32PM.  Routine Hem:    29-Jan-15 05:01, Hemogram, Platelet Count  WBC (CBC) 10.9  RBC (CBC) 3.47  Hemoglobin (CBC) 10.7  Hematocrit (CBC) 31.3  Platelet Count (CBC) 142  Result(s) reported on 28 Feb 2013 at 05:38AM.  MCV 90  MCH 30.9  MCHC 34.3  RDW 14.6   ASSESSMENT AND PLAN:  Assessment/Admission Diagnosis Patient with end-stage renal disease on hemodialysis, hypertension, diabetes, and seizure disorder who has 2 day history of weakness, fever, chills, nausea. Per documentation in the ER, she was found having symptoms of sepsis with elevated heart rate and mild hypotension with fever of 102 degrees Fahrenheit. Her hemodialysis access was found to be thrombosed.  We were consulted for placement of temporary hemodialysis catheter which will be placed today.   Electronic Signatures: HSu Grand(PA-C)  (Signed 29-Jan-15 21:24)  Authored: Chief Complaint and History, PAST MEDICAL/SURGICAL HISTORY, ALLERGIES, HOME MEDICATIONS, Family and Social History, Review of Systems, Physical Exam, LABS, Assessment and Plan   Last Updated: 29-Jan-15 21:24 by HSu Grand(PA-C)

## 2014-05-24 NOTE — Consult Note (Signed)
PATIENT NAME:  [name], [name] MR#:  [id number] DATE OF BIRTH:  07/09/1985  DATE OF CONSULTATION:  07/16/2013  CONSULTING PHYSICIAN:  Gerrit Heck. Troxler, DPM  REASON FOR CONSULTATION:  Ulceration with infection, possible osteomyelitis left great toe.   HISTORY OF PRESENT ILLNESS: The patient is a 29 year old female with a long-term history of type 1 diabetes, followed by my partner, Dr. Cleda Mccreedy, for an ulceration on this left great toe. He saw her last month and told her to return if she saw any problems with it. She came in the hospital on Sunday and was admitted for infection and significant change to the left great toe. She had some fevers and chills for 3 to 4 days before coming to the ER. She was started on Zosyn in the Emergency Room.  Does have on the labs has a left shift but no elevated white count.   PAST MEDICAL HISTORY:  Includes insulin-dependent diabetes mellitus since she was 29 years old, end-stage renal disease, is on hemodialysis 3 times a week, history of hyperlipidemia, hypertension, PVD, peripheral neuropathy, chronic ulcer as noted on the left foot and seizure disorder.  ALLERGIES:  VANCOMYCIN AND LATEX.   HOME MEDICATIONS: Include pravastatin, Toprol, lisinopril, Lantus, Keppra, gabapentin, buspirone and Tylenol.   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   SOCIAL HISTORY: Denies smoking, drinking or illicit drug use.   FAMILY HISTORY: Mother with diabetes.   VITAL SIGNS: Include temperature at present 98.3, pulse 99, respirations 17. Blood pressure is 117/74, pulse ox 97.  LOWER EXTREMITY EXAMINATION: Vascular (Dictation Anomaly) <<palpable left footMISSING TEXT>> pulses. DP pulse to the left foot is palpable at +2/4. The PT pulse is more diminished at +1/4 to trace and is harder to feel.  Obvious lack of blood flow to the left hallux. There is a wound present on the left  hallux that is malodorous with considerable necrotic gangrenous tissue in the region, sort of a moist gangrenous tissue in the area.  X-rays reviewed showed significant tissue loss along the deeper medial plantar aspect of the digit. This is consistent with my clinical findings. The wound probes down to tendon and likely bone to the area also.   TREATMENT PLAN: I did an excisional debridement and removal of hyperkeratotic tissue, removal of infected soft tissue as well at bedside. There was no bleeding associated with this utilizing a sterile #15 blade. A culture was taken and will be sent for aerobic and anaerobic evaluation.  I think the only other option with this toe is amputation. The question is going to be what level we need to take it back to. Likely, we will have to remove the metatarsal head as well in order to get an area where she will have enough blood flow to promote healing across the region. I explained this to her today, she is understanding and has been through this before on her right foot. We will put a wet-to-dry saline dressing on the area today, let her continue her antibiotics, get the culture and we will discuss this with vascular but I do not think they will need to do an angioplasty on her since she has palpable pulses to the foot. We will try to set something up for Thursday.   ____________________________ Gerrit Heck. Troxler, DPM mgt:ce D: 07/16/2013 18:33:35 ET T: 07/16/2013 18:51:43 ET JOB#: [id number]  cc: Gerrit Heck Troxler, DPM, <Dictator> MATTHEW Philbert Riser MD ELECTRONICALLY SIGNED  07/18/2013 8:00 

## 2014-05-24 NOTE — Op Note (Signed)
PATIENT NAME:  [name], [name] MR#:  [id number] DATE OF BIRTH:  February 19, 1985  DATE OF PROCEDURE:  04/17/2013  PREOPERATIVE DIAGNOSES:  1. End-stage renal disease.  2. Severe hypertension.  3. Diabetes.   POSTOPERATIVE DIAGNOSES:  1. End-stage renal disease.  2. Severe hypertension.  3. Diabetes.   PROCEDURE PERFORMED: Right brachiocephalic arteriovenous fistula creation.   SURGEON: Algernon Huxley, M.D.   ANESTHESIA: General.   ESTIMATED BLOOD LOSS: Minimal.   INDICATION FOR PROCEDURE: A 29 year old [place] American female with end-stage renal disease. She needs permanent dialysis access. Her previous left radiocephalic AV fistula has failed. She has a good cephalic vein at the antecubital fossa on the right. We are planning a right brachiocephalic AV fistula.   DESCRIPTION OF PROCEDURE: The patient was brought to the operative suite, and after an adequate level of general anesthesia was obtained, the right upper extremity was sterilely prepped and draped, and a sterile surgical field was created. A curvilinear incision was created at the antecubital fossa. We dissected out the cephalic vein which was large and patent at this location. It was a little thickened, likely from multiple previous blood draws in this area. The brachial artery was then dissected out and encircled with vessel loops proximally and distally. The vein was marked for orientation, ligated distally and transected. It was controlled with a bulldog clamp. An anterior wall arteriotomy was created with an 11 blade and extended with Potts scissors after 3000 units of intravenous heparin were given for systemic anticoagulation. The vein was then cut and beveled to match the arteriotomy. An anastomosis was created with a running 6-0 Prolene suture in the usual fashion. A single 6-0 Prolene patch suture was used for hemostasis. The vessel was flushed and de-aired prior to release of control. On release, there was some mild vasospasm  which was treated topically with papaverine. This resulted in a soft thrill within the AV fistula and pulse present in the brachial artery distal to the anastomosis. At this point, I elected to terminate the procedure. The wound was closed with 3-0 Vicryl and 4-0 Monocryl. Dermabond was placed as a dressing. The patient tolerated the procedure well and was taken to the recovery room in stable condition.   ____________________________ Algernon Huxley, MD jsd:gb D: 04/17/2013 15:16:00 ET T: 04/17/2013 21:40:13 ET JOB#: [id number]  cc: Algernon Huxley, MD, <Dictator> Algernon Huxley MD ELECTRONICALLY SIGNED 05/06/2013 9:43

## 2014-05-24 NOTE — Op Note (Signed)
PATIENT NAME:  LAMARIYA, BIEDA MR#:  [id number] DATE OF BIRTH:  24-Mar-1985  DATE OF PROCEDURE:  09/13/2013  PREOPERATIVE DIAGNOSES:  1. Complication of dialysis device with nonfunction of tunneled catheter.  2. End-stage renal disease requiring hemodialysis.   POSTOPERATIVE DIAGNOSES:   1. Complication of dialysis device with nonfunction of tunneled catheter.  2. End-stage renal disease requiring hemodialysis.   PROCEDURE PERFORMED: Removal of right internal jugular cuffed tunneled dialysis catheter.  SURGEON: Dr. Delana Meyer  DESCRIPTION OF PROCEDURE: The patient is in the special procedures holding area. She is positioned supine. Her right neck and chest wall are prepped and draped in a sterile fashion. Palpation localizes the cuff and 1% lidocaine with epinephrine is infiltrated in the soft tissues. A small incision is made. The cuff is exposed, freed from surrounding adhesions and the catheter is removed. Pressure is held at the base of the neck for several minutes.   4-0 Monocryl is then used to close the tract and then the skin is closed with 4-0 Monocryl subcuticular and Dermabond. Antibiotic ointment and a sterile 2 x 2 is applied as dressing at the old exit site. The patient tolerated the procedure well without complication.    ____________________________ Katha Cabal, MD ggs:JT D: 09/13/2013 17:49:05 ET T: 09/14/2013 03:15:30 ET JOB#: [id number]  cc: Katha Cabal, MD, <Dictator> Katha Cabal MD ELECTRONICALLY SIGNED 10/08/2013 22:34

## 2014-05-24 NOTE — Discharge Summary (Signed)
PATIENT NAME:  [name], [name] MR#:  [id number] DATE OF BIRTH:  1986/01/11  DATE OF ADMISSION:  02/28/2013 DATE OF DISCHARGE:  03/05/2013  DISCHARGE DIAGNOSES: 1.  Sepsis due to viral gastroenteritis. Blood culture were all negative. No other source of infection.  2.  Hypertension.  3.  Diabetes.  4.  Blocked hemodialysis access, permacath placed. Need follow-up in vascular clinic for fistula placement, seizure disorder, smoker.   CONDITION ON DISCHARGE: Stable.   MEDICATION:   1.  Calcium acetate 667 mg oral capsule 2 capsules orally 3 times a day and 1 capsule with snacks oral once a day.  2.  Keppra 750 mg once a day.  4.  Metoprolol succinate 50 mg tablet 2 times a day.  5.  Pravastatin 40 mg once a day.   DIET ON DISCHARGE: Renal diet. Regular consistency diet.   FOLLOWUP:  Advised to have follow up with vascular clinic within 1 to 2 weeks to establish hemodialysis access.  HISTORY OF PRESENTING ILLNESS: A 29 year old female with history of end-stage renal disease was on hemodialysis. Had hypertension, diabetes and seizure disorder. She was doing reasonably good until 2 day ago. In the morning she woke up with feeling of weakness. In the afternoon, she had vomited and feeling nauseous, having fever and chills with some headache.  She decided to come to the Emergency Room. In the ER, she was having symptoms of sepsis with elevated heart rate and mild hypotension and fever of 102 degrees. Chest x-ray was negative, but hemodialysis access was found to be blocked and so she was admitted for further work-up under the hospitalist services.   HOSPITAL COURSE AND STAY:  1.  She came with fever and malfunctioning of hemodialysis access. She was due to for hemodialysis on the same day. She was started on hemodialysis after placing a temporary dialysis catheter by Vascular. She was started on broad-spectrum antibiotic because of this history and presentation with sepsis. Blood cultures were  collected. One out of 2 blood cultures was reported gram-positive cocci.  X-ray of the chest and UA were not suggestive for any infections. Most likely her presentation of sepsis was due to viral gastroenteritis which is resolving now.  She remained afebrile in the hospital.  She was on broad-spectrum antibiotic. We stopped her antibiotic because even repeat blood culture remains negative. Only 1 out of 4 culture was positive which was likely a contamination with skin flora.  2.  Blocked hemodialysis access as mentioned above. A temporary catheter was placed initially after she was confirmed not having bacteremia.  Vascular tried to open up her AV fistula, but they were unsuccessful and so finally they placed a permacath and will call her as outpatient to establish a new access in next 1 to 2 weeks.  3.  Seizure disorder, hypertension, diabetes. We continued her baseline medication and she remained stable in the hospital.   [place]: Vascular with Dr. Lucky Cowboy and nephrology with Dr. Holley Raring and Dr. Candiss Norse.   IMPORTANT LABORATORY RESULTS IN THE HOSPITAL: White cell count on presentation 10.9 and hemoglobin was 10.7, glucose 109, BUN 44, creatinine 10.83, sodium 131 and potassium 4.3. Influenza A and B on admission negative.   Chest x-ray: No acute cardiopulmonary disease.   Blood culture on the 29th of January: One was negative.  The other one was gram-positive cocci, slow-growing. Blood culture reported on 31st of January of which remained negative.   TOTAL TIME SPENT ON THIS DISCHARGE: 40 minutes.  ____________________________ Ceasar Lund Anselm Jungling, MD vgv:dp D: 03/08/2013 11:07:58 ET T: 03/08/2013 11:54:52 ET JOB#: [id number]  cc: Ceasar Lund. Anselm Jungling, MD, <Dictator> Algernon Huxley, MD [place] Lilian Kapur, MD  Vaughan Basta MD ELECTRONICALLY SIGNED 03/23/2013 8:37

## 2014-05-24 NOTE — Consult Note (Signed)
Brief Consult Note: Diagnosis: gangrene left great toe.   Patient was seen by consultant.   Consult note dictated.   Recommend to proceed with surgery or procedure.   Comments: P thas palpable pulses to left foot.  Likely microvascular shutdown to left great toe.  Will need lilkely 1st ray amputation.  Electronic Signatures: Perry Mount (MD)  (Signed 16-Jun-15 18:27)  Authored: Brief Consult Note   Last Updated: 16-Jun-15 18:27 by Perry Mount (MD)

## 2014-05-24 NOTE — H&P (Signed)
PATIENT NAME:  [name], [name] MR#:  [id number] DATE OF BIRTH:  [birth year]  DATE OF ADMISSION:  07/15/2013  PRIMARY CARE PHYSICIAN: Nonlocal.   REFERRING PHYSICIAN: Dr. Kerman Passey.   CHIEF COMPLAINT: Fever and chills.   HISTORY OF PRESENT ILLNESS: The patient is a 29 year old female with history of end-stage renal disease on hemodialysis, chronic left foot ulcer since 2008 which gets exacerbated from time to time, with right foot toe amputations, comes to the Emergency Department with complaints of fevers, chills for the last 3 to 4 days. The patient was walking this morning, looked at her foot, and noticed to have significant drainage from the foot. Also noticed to have pain in the left thigh. Noticed to have fever of 102 at home. Concerning this, came to the Emergency Department. On work-up in the Emergency Department, x-ray of the great toe shows osteomyelitis involving the base of the first distal phalanx. The patient is found to have a normal WBC count; however, has a left shift of 80%. The patient received Zosyn in the Emergency Department.   PAST MEDICAL HISTORY: 1.  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  2.  End-stage renal disease on hemodialysis Tuesday, Thursday, Saturday schedule.  3.  Hypertension.  4.  Hyperlipidemia.  5.  Peripheral vascular disease.  6.  Peripheral neuropathy.  7.  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  8.  Seizure disorder.    ALLERGIES:  1.  VANCOMYCIN.  2.  LATEX.    HOME MEDICATIONS: 1.  Pravastatin 40 mg once a day. 2.  Toprol-XL 50 mg once a day.  3.  Lisinopril 20 mg once a day. 4.  Lantus 10 units once a day.  5.  Keppra 750 mg 2 times a day.  6.  Gabapentin 100 mg once a day.  7.  Calcium acetate 3 tablets 3 times a day.  8.  Buspirone 10 mg 2 times a day.  9.  Acetaminophen every four hours as needed.     PAST SURGICAL HISTORY: 1.  Carpal tunnel surgery.  2.  Eye surgery. 3.  Multiple surgeries for the  fistula and hemodialysis access.    SOCIAL HISTORY: No history of smoking, drinking alcohol or using illicit drugs.   FAMILY HISTORY: Mother with diabetes mellitus, multiple other problems like heart problems and stroke.   REVIEW OF SYSTEMS: CONSTITUTIONAL: Experiencing generalized weakness.  EYES: No change in vision.  ENT: No change in hearing.  RESPIRATORY: Denies any cough, shortness of breath.  CARDIOVASCULAR: No chest pain or palpitations.  GASTROINTESTINAL: Decreased appetite. No abdominal pain, nausea, vomiting or diarrhea.  GENITOURINARY: The patient has end-stage renal disease.  ENDOCRINE: The patient has diagnosis of diabetes mellitus.  SKIN: Ulcer to the left foot.  NEUROLOGIC: No weakness or numbness in any part of the body.   PHYSICAL EXAMINATION: GENERAL: This is a well-built, well-nourished, age-appropriate female, lying down in the bed, not in distress.  VITAL SIGNS: Temperature 97.9, pulse 102, blood pressure 152/72, respiratory rate of 16, oxygen saturation 98% on room air.  HEENT: Normocephalic, atraumatic. There is no scleral icterus. Conjunctivae normal. Pupils equal and reactive. Extraocular movements are intact. Mucous membranes moist. No pharyngeal erythema.  NECK: Supple. No lymphadenopathy. No JVD. No carotid bruit.  CHEST: Has no focal tenderness.  LUNGS: Bilaterally clear to auscultation.  HEART: S1, S2 regular. No murmurs are heard.  ABDOMEN: Bowel sounds present. Soft, nontender, nondistended. No hepatosplenomegaly.  EXTREMITIES: Right foot has multiple toe amputations. Left  has purulent drainage of the left great toe.    LABORATORY DATA: Complete metabolic panel: BUN 64, creatinine of 11.78. The rest of the values are within normal limits. X-ray of the great toe: Cannot exclude osteomyelitis. CBC: WBC of 8.9, hemoglobin 9.6, platelet count of 177.   ASSESSMENT AND PLAN: This patient is a 29 year old female who comes with left foot cellulitis, abscess,  possible osteomyelitis, and sepsis secondary to this. 1.  Sepsis secondary to left foot ulcer. Blood cultures have been obtained. Continue the Zosyn and clindamycin. We will obtain MRA of the left foot to rule out underlying abscess. We will consult orthopedic surgery or podiatry.  2.  The left foot osteomyelitis. We will obtain arterial Dopplers to evaluate the flow to the foot. 3.  End-stage renal disease on hemodialysis. Consult nephrology.  4.  Diabetes mellitus. The patient states she usually takes Lantus 10 units unless the patient's blood sugars are over 200. Will hold the Lantus for now, continue with sliding scale insulin.  5.  Keep the patient on deep vein thrombosis prophylaxis with heparin.   TIME SPENT: 55 minutes.    ____________________________ Monica Becton, MD pv:cg D: 07/15/2013 23:14:57 ET T: 07/16/2013 02:17:22 ET JOB#: [id number]  cc: Monica Becton, MD, <Dictator> Monica Becton MD ELECTRONICALLY SIGNED 07/17/2013 7:30

## 2014-05-24 NOTE — Op Note (Signed)
PATIENT NAME:  [name], [name] MR#:  [id number] DATE OF BIRTH:  1985/09/22  DATE OF PROCEDURE:  05/27/2013  PREOPERATIVE DIAGNOSES:  1.  End-stage renal disease.  2.  Poorly maturing right arm arteriovenous fistula.  3.  Hypertension.  POSTOPERATIVE DIAGNOSES: 1.  End-stage renal disease.  2.  Poorly maturing right arm arteriovenous fistula.  3.  Hypertension.   PROCEDURE: 1.  Ultrasound guidance for vascular access, right brachiocephalic arteriovenous fistula.  2.  Right upper extremity fistulogram and central venogram.  3.  Percutaneous transluminal angioplasty of perianastomotic stenosis with 6 mm diameter Lutonix drug-coated angioplasty balloon.   SURGEON: Leotis Pain, M.D.   ANESTHESIA: Local with moderate conscious sedation.   ESTIMATED BLOOD LOSS: Minimal.   FLUOROSCOPY TIME: Approximately 2 minutes.   INDICATION FOR PROCEDURE: A 29 year old [place] American female with end-stage renal disease. Her right arm brachiocephalic AV fistula, which was placed about 6 weeks ago has a perianastomotic stenosis on noninvasive studies and has not matured to the point to be usable. We are treating this area to try to salvage the fistula and get it usable for dialysis.   DESCRIPTION OF PROCEDURE: The patient is brought to the vascular suite. The right upper extremity was sterilely prepped and draped in a sterile surgical field was created. The fistula was accessed in the mid upper arm under ultrasound guidance with a micropuncture needle and a permanent image was recorded. Micropuncture wire and sheath were then placed. Imaging was then performed through a Kumpe catheter after upsizing to a 6-French sheath. This showed about an 85% stenosis 1 to 2 cm beyond the anastomosis in the perianastomotic region. The remainder of the fistula appeared patent. I then placed a Magic torque wire and treated this lesion with a 6 mm diameter x 6 cm length Lutonix drug coated angioplasty balloon with an  excellent angiographic completion result and no more than 10% to 15% residual stenosis. At this point, I elected to terminate the procedure. The sheath was removed. A 4-0 Monocryl pursestring suture was placed. Pressure was held. Sterile dressing was placed. The patient tolerated the procedure well and was taken to the recovery room in stable condition.  ____________________________ Algernon Huxley, MD jsd:aw D: 05/27/2013 11:53:37 ET T: 05/27/2013 12:21:39 ET JOB#: [id number]  cc: Algernon Huxley, MD, <Dictator> Algernon Huxley MD ELECTRONICALLY SIGNED 06/06/2013 14:19

## 2014-05-24 NOTE — Discharge Summary (Signed)
PATIENT NAME:  [name], [name] MR#:  [id number] DATE OF BIRTH:  06/25/1985  DATE OF ADMISSION:  07/15/2013  DATE OF DISCHARGE:  07/20/2013  PRIMARY CARE PHYSICIAN:  Non-local  CONSULTING PHYSICIAN:  Dr. Elvina Mattes, Dr. Margaretmary Eddy, Dr. Candiss Norse  DISCHARGE DIAGNOSES: 1.  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  2.  Sepsis.  3.  End-stage renal disease.  4.  Hypertension. 5.  Diabetes. 6.  Anemia. 7.  Seizure disorder   PROCEDURE: First degree amputation left foot.   CONDITION: Stable.   CODE STATUS: FULL CODE.   HOME MEDICATIONS: Please refer to the medication reconciliation list.   INSTRUCTIONS:  Diet: Low sodium, low fat, low cholesterol diet. Activity as tolerated. Followup care: Follow up with PCP within 1 to 2 weeks. Follow up with Dr. Elvina Mattes within 1 to 2 weeks. Follow up with nephrologist within 1 to 2 weeks. The patient needs follow up with Dr. Elvina Mattes for left foot wound care.   REASON FOR ADMISSION: Fever and chills. The patient is a 29 year old, African-American female with a history of hypertension, diabetes, ESRD. Presented to the ED with fever and chills for 3 to 4 days. The patient noticed to have have significant drainage from the left foot. X-ray of the great toe shows osteomyelitis. The patient received Zosyn in the ED and admitted for osteomyelitis. For detailed history and physical examination, please refer to the admission note dictated by Dr. Lunette Stands.   LABORATORY DATA: Showed BUN 64, creatinine 11.78, WBC 8.9, hemoglobin 9.6, platelets 177.   HOSPITAL COURSE:  1.  Sepsis secondary to left great toe osteomyelitis. After admission, patient was treated with Zosyn and clindamycin. Dr. Elvina Mattes did incision and debridement the first day after admission. Wound culture showed enterococcus sensitive to Levaquin. Antibiotic was changed to Levaquin; however, patient had allergic reaction to Levaquin. She complains of itching, so Levaquin was discontinued. We started  Augmentin p.o., patient had no reaction.   2.  Dr. Elvina Mattes did a left great toe amputation yesterday. The patient's left foot is in dressing. Dr. Elvina Mattes suggested followup with him as outpatient.   3.  ESRD: Patient underwent hemodialysis, patient will get hemodialysis today.   4.  Diabetes, controlled with sliding scale.  5.  Anemia. The patient has anemia of chronic disease. Hemoglobin was 7.8, decreased to 7.3. Dr. Margaretmary Eddy will give Procrit during dialysis today. The patient needs followup hemoglobin with Nephrology or PCP.  The patient is clinically stable. Will be discharged to home after hemodialysis today. I discussed patient's discharge plan with the patient, nurse, case manager, and Dr. Margaretmary Eddy.   TIME SPENT:  About 38 minutes.    ____________________________ Demetrios Loll, MD qc:mr D: 07/20/2013 14:18:00 ET T: 07/20/2013 18:05:41 ET JOB#: [id number]  cc: Demetrios Loll, MD, <Dictator> Demetrios Loll MD ELECTRONICALLY SIGNED 07/21/2013 14:13

## 2014-05-24 NOTE — Op Note (Signed)
PATIENT NAME:  [name], STEUER MR#:  [id number] DATE OF BIRTH:  1985/04/02  DATE:  02/08/2013  PREOPERATIVE DIAGNOSIS: Right carpal tunnel syndrome.   POSTOPERATIVE DIAGNOSIS: Right carpal tunnel syndrome.   PROCEDURE: Right carpal tunnel release.   ANESTHESIA: General.   SURGEON: Hessie Knows, M.D.   DESCRIPTION OF PROCEDURE: The patient was brought to the Operating Room, and after adequate anesthesia was obtained, the right arm was prepped and draped in the usual sterile fashion. Because of a fistula in the other arm and vascular problems in the legs, the IV for the case was in the right antecubital fossa. Because of this, prepping was carried out to that level, and a sterile tourniquet was applied below the elbow. After appropriate patient identification,  timeout procedures were completed. The tourniquet was raised to 200 mmHg. An incision was made in line with the ring metacarpal, approximately 2.5 cm in length. The incision was carried down through the skin and subcutaneous tissue. At the level of the transcarpal ligament, the ligament was incised, and the underlying tissues protected with a vascular hemostat. Dissection was carried out at the ulnar side of the carpal tunnel. Release was carried out distally until there was fat noted around the nerve. Going proximally, release was carried out proximal to the wrist flexion crease. At this point, the nerve appeared to be adequately released, with an area of apparent compression at the level of the wrist flexion crease. The wound was irrigated and injected with 10 mL of 0.5% Sensorcaine to aid in postop analgesia. The wound was then closed with simple interrupted 4-0 nylon skin sutures. Sterile dressing of Xeroform, 4 x 4, Webril and Ace wrap applied. The patient was sent to the recovery room in stable condition.   ESTIMATED BLOOD LOSS: Minimal.   COMPLICATIONS: None.   SPECIMEN: None.   TOURNIQUET TIME: 10 minutes at 200 mmHg.      ____________________________ Laurene Footman, MD mjm:mr D: 02/08/2013 17:42:28 ET T: 02/08/2013 22:24:41 ET JOB#: [id number]  cc: Laurene Footman, MD, <Dictator> Laurene Footman MD ELECTRONICALLY SIGNED 02/11/2013 6:53

## 2014-05-24 NOTE — H&P (Signed)
PATIENT NAME:  [name], [name] MR#:  [id number] DATE OF BIRTH:  04-23-1985  DATE OF ADMISSION:  02/28/2013  PRIMARY NEPHROLOGIST: In [place].  REFERRING EMERGENCY ROOM PHYSICIAN: Dr. Harvest Dark.  CHIEF COMPLAINT: Fever and chills.   HISTORY OF PRESENT ILLNESS: The patient is a 29 year old female who has history of end-stage renal disease on hemodialysis, hypertension, diabetes, and seizure disorder who was doing reasonably good until 2 days awoke. Yesterday morning she woke up feeling a little weak and in the afternoon she had vomited with feeling some nauseous after that and having fever and chills with mild headache, so today decided to come to the Emergency Room for this complaint. In the ER, she was found having symptoms of sepsis with elevated heart rate and mild hypotension with fever of 102 degrees Fahrenheit. Chest x-ray was negative, but her hemodialysis access found to be blocked with thrombus and so she is being admitted for further work-up under the hospitalist team.   REVIEW OF SYSTEMS: CONSTITUTIONAL: Positive for fever, but no fatigue, weakness, pain, or weight loss.  EYES: No blurring, double vision, discharge, or redness.  EARS, NOSE, THROAT: No tinnitus, ear pain, or hearing loss.  RESPIRATORY: No cough, wheezing, hemoptysis, or shortness of breath.  CARDIOVASCULAR: No chest pain, orthopnea, edema, arrhythmia, or palpitations.  GASTROINTESTINAL: The patient has some nausea and vomited once, but no diarrhea or constipation or abdominal pain.  GENITOURINARY: No dysuria, hematuria, or increased frequency.  ENDOCRINE: No heat or cold intolerance. No increased sweating.  SKIN: No rashes.  LEGS: No edema.  NEUROLOGIC: No numbness, weakness, tremor, or vertigo.  PSYCHIATRIC: No anxiety, insomnia, or bipolar disorder.   PAST MEDICAL HISTORY: 1.  Hypertension. 2.  Diabetes. 3.  End-stage renal disease. 4.  Seizure disorder.   PAST SURGICAL HISTORY:  1.  Carpal  tunnel surgery last month. 2.  Eye surgery for blood in the eye in the past. 3.  Multiple surgeries for fistula and hemodialysis access.   SOCIAL HISTORY: She smokes once in awhile, but not every day. No drinking alcohol. No illegal drug use.   FAMILY HISTORY: Mother has diabetes.   HOME MEDICATIONS: Does not remember the dozing right now, but medications, what she takes, is metoprolol, lisinopril for her hypertension. For diabetes she is advised to take insulin 20 units daily, but that makes her hypoglycemic and so she is not taking any type of insulin since last few weeks and her sugar at high goes up to 160. When she notices her sugar goes up to 200 she takes 10 units of insulin, but not every day. For seizure disorder, she takes Keppra 750 once a day, and she takes medicine, calcium, for hemodialysis.  PHYSICAL EXAMINATION: VITAL SIGNS: In the ER, temperature 102.5 and maximum 103.1, pulse rate 131 currently came up to 94, blood pressure dropped to the lowest 86/47 in the ER and currently 120/63. Pulse ox 96 on room air.  GENERAL: The patient is fully alert and oriented, appears slightly distressed due to fever and mild headache, but cooperative with history taking and physical examination.  HEENT: Head and neck atraumatic. Conjunctiva pink. Oral mucosa moist.  NECK: Supple. No JVD.  RESPIRATORY: Bilateral clear and equal air entry.  CARDIOVASCULAR: S1 an S2 present, regular. No murmur.  ABDOMEN: Soft, nontender. Bowel sounds present. No organomegaly.  SKIN: No rashes.  LEGS: No edema.  NEUROLOGIC: Power 5 out of 5. Follows commands. Moves all 4 limbs. No gross abnormality.  JOINTS: No swelling or tenderness.  PSYCHIATRIC: Does not appear in any acute psychiatric illness at this time.   IMPORTANT LABORATORY AND DIAGNOSTICS: Glucose 109, BUN 44, creatinine 10.88, sodium 131, potassium 4.3, chloride 94, CO2 33, and calcium is 9.2. Total protein 7.8, bilirubin total 0.5, alkaline phosphate  42, SGOT 16, and SGPT less than 6. WBC 10.9, hemoglobin 10.7, platelet count 142, and MCV is 90. Influenza A and B are negative.   Chest x-ray, PA and lateral: Heart size upper normal, hypoaeration with interstitial and vascular clouding, mild interstitial edema. Atypical viral infection cannot be excluded. No focal consolidation.   Ultrasound of left upper extremity for AV fistula shows fistula thrombosed, complete thrombosis of draining vein.   ASSESSMENT AND PLAN: A 29 year old female who came to the hospital with complaint of fever, chills, and nausea. She is a hemodialysis patient and found having blocked and clotted AV arteriovenous fistula.  1.  Sepsis. This is evident by fever, tachycardia, and hypotension noted on presentation to ER. Likely the cause is pneumonia or bacteremia. The chest x-ray picture is not very clear of having any pneumonia, it might be viral illness too, and she does not have typical symptoms of cough or sputum production, but because she is a hemodialysis patient and highly likely to have bacteremia we will give her broad-spectrum antibiotic at this time. Blood cultures were collected by ER.  2.  Blocked hemodialysis access. I spoke to nephrologist, Dr. Candiss Norse, about this issue. The patient is due for dialysis today. Currently, there are no signs of fluid overload, but because of her presence with sepsis we cannot get any alternative access placement at this time, so I spoke to vascular and we will go with temporary dialysis access. Once her septicemia is cleared, we will proceed with arranging some kind of permanent alternative method for hemodialysis access.  3.  Hypertension. Currently because of borderline hypotension secondary to her sepsis we would like to hold her metoprolol and lisinopril. Blood pressure is stable.  4.  Diabetes. As she says that her sugar was going low with insulin regular dose of 20 units and she stopped taking it and was not taking anything for the  last few weeks, I will just keep her on insulin sliding scale coverage in hospital.  5.  Seizure disorder. Will continue Keppra here in the hospital.  6.  Active smoker. Smoking cessation counseling is done for 4 minutes. She agreed to quit but does not require any nicotine patch in the hospital at this time.   TOTAL TIME SPENT ON THIS ADMISSION: 50 minutes.   ____________________________ Ceasar Lund Anselm Jungling, MD vgv:sb D: 02/28/2013 08:31:30 ET T: 02/28/2013 09:08:09 ET JOB#: [id number]  cc: Ceasar Lund. Anselm Jungling, MD, <Dictator> Vaughan Basta MD ELECTRONICALLY SIGNED 03/23/2013 8:36

## 2014-05-24 NOTE — Op Note (Signed)
PATIENT NAME:  TESHARA, HEIRONIMUS MR#:  [id number] DATE OF BIRTH:  1985-08-03  DATE OF PROCEDURE:  03/04/2013  PREOPERATIVE DIAGNOSES: 1.  End-stage renal disease.  2.  Clotted left arm arteriovenous fistula.  3.  Sepsis, improving. Negative blood cultures.   POSTOPERATIVE DIAGNOSES: 1.  End-stage renal disease.  2.  Clotted left arm arteriovenous fistula.  3.  Sepsis, improving. Negative blood cultures.   PROCEDURES: 1.  Ultrasound guidance for vascular access to left radiocephalic arteriovenous fistula.  2.  Left upper extremity fistulogram.  3.  Ultrasound guidance for vascular access, right jugular vein.  4.  Placement of right jugular PermCath with ultrasound and fluoroscopic guidance.   SURGEON: Algernon Huxley, MD   ANESTHESIA: Local with moderate conscious sedation.   ESTIMATED BLOOD LOSS: Approximately 25 mL.   INDICATION FOR PROCEDURE: This is a 29 year old African American female with end-stage renal disease. Her left radiocephalic AV fistula is clotted. We bring her down today for a fistulogram to see if this is salvageable and if not, we will place a PermCath.   DESCRIPTION OF PROCEDURE: The patient was brought to the vascular suite. After an adequate level of intravenous sedation was obtained initially, we prepped her left upper extremity. The fistula was accessed just beyond the anastomosis. A micropuncture needle, micropuncture wire, and sheath were placed and a permanent image recorded for ultrasound guidance. Imaging was performed through the micropuncture sheath. Her fistula was wholly thrombosed, as were all the branches. Previously, this had a cephalic vein that occluded in the mid forearm and drained through branches. No longer were there large draining branches, and this fistula was not salvageable. I abandoned this and turned to a PermCath placement. The right jugular vein was visualized with ultrasound and found to widely patent. It was then accessed under direct  ultrasound guidance without difficulty with a Seldinger needle. A J-wire was then placed. After skin nick and dilatation, the peel-away sheath was placed over wire. Counterincision was made 2 fingerbreadths below the right clavicle, and I tunneled from the subclavicular incision to the access site. Fluoroscopic guidance was used to selected a 19 cm tip to cuff tunneled hemodialysis catheter. Tunneled from the subclavicular incision, then placed through the peel-away sheath, and the peel-away sheath was removed. Catheter tips were parked in the right atrium. The appropriate distal connectors were placed with good withdrawal of blood and flushing easily with heparinized saline, and then  a concentrated heparin solution was placed. It was secured to the chest wall with 2 Prolene sutures. A 4-0 Monocryl pursestring suture was placed at the access site, and a 4-0 Monocryl suture was placed around the exit site. Sterile dressing was placed. The patient tolerated the procedure well and was taken to the recovery room in stable condition.    ____________________________ Algernon Huxley, MD jsd:jcm D: 03/04/2013 14:37:47 ET T: 03/04/2013 20:55:37 ET JOB#: [id number]  cc: Algernon Huxley, MD, <Dictator> Algernon Huxley MD ELECTRONICALLY SIGNED 03/06/2013 17:21

## 2014-05-24 NOTE — Op Note (Signed)
PATIENT NAME:  [name], [name] MR#:  [id number] DATE OF BIRTH:  December 06, 1985  DATE OF PROCEDURE:  02/28/2013  PREOPERATIVE DIAGNOSES:  1.  End-stage renal disease.  2.  Clotted left arm arteriovenous fistula.  3.  Sepsis syndrome.  4.  Severe hypertension.  5.  Diabetes.   POSTOPERATIVE DIAGNOSES: 1.  End-stage renal disease.  2.  Clotted left arm arteriovenous fistula.  3.  Sepsis syndrome.  4.  Severe hypertension.  5.  Diabetes.   PROCEDURES: 1.  Ultrasound guidance for vascular access to right femoral vein.  2.  Placement of right femoral Trialysis-type dialysis catheter, 30 cm in length.   SURGEON:  Algernon Huxley, MD   ASSISTANT:  Jairo Ben PA-C.   ANESTHESIA:  Local.   ESTIMATED BLOOD LOSS:  20 to 25 mL.   INDICATION FOR PROCEDURE:  A 29 year old [place] American female with end-stage renal disease is admitted with sepsis. Her previously functional left arm AV fistula appears clotted and she will need dialysis access today.   DESCRIPTION OF PROCEDURE:  The patient's groin was sterilely prepped and draped and a sterile surgical field was created. The right femoral vein was visualized on ultrasound and found to be patent. It was then accessed under direct ultrasound guidance without difficulty with Seldinger needle. A J-wire was then placed. After skin nick and dilatation, the Trialysis-type dialysis catheter was placed over the wire and the wire was removed. All 3 lumens withdrew dark red, nonpulsatile blood and flushed easily with sterile saline. It was secured at the skin at 29 cm. A sterile dressing was placed. The patient tolerated the procedure well.   ____________________________ Algernon Huxley, MD jsd:jm D: 02/28/2013 15:35:03 ET T: 02/28/2013 17:17:14 ET JOB#: [id number]  cc: Algernon Huxley, MD, <Dictator> Algernon Huxley MD ELECTRONICALLY SIGNED 03/05/2013 11:06

## 2014-05-24 NOTE — Op Note (Signed)
PATIENT NAME:  [name], [name] MR#:  [id number] DATE OF BIRTH:  Feb 24, 1985  DATE OF PROCEDURE:  07/18/2013  PREOPERATIVE DIAGNOSIS:  Gangrene, left great toe.   POSTOPERATIVE DIAGNOSIS:  Gangrene, left great toe.  PROCEDURE PERFORMED:  First ray amputation, left foot.   SURGEON:  Gerrit Heck. Troxler, M.D.   ASSISTANT:  None.   HISTORY OF PRESENT ILLNESS:  The patient was admitted to the hospital recently. One of her problems was foul odor and infection to the left great toe. Evaluation of that digit showed severe soft tissue damage and gangrene to the left great toe. 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   ANESTHESIA:  General.   ESTIMATED BLOOD LOSS:  Negligible.   HEMOSTASIS:  Ankle tourniquet with (Dictation Anomaly) <<M250 mmHG ISSING TEXT>> mmHg pressure.   OPERATIVE REPORT:  The patient was brought to the OR and placed on the OR table in the supine position. At this point, after general anesthesia was achieved by the anesthesia team, and local anesthesia was delivered by me, the patient was then prepped and draped in the usual sterile manner. At this time, attention was directed to the left great toe where 3 semielliptical incisions were made over the base of the great toe where there was viable tissue. At this point, this incision was carried all the way down to bone. The toe was released from all of its soft tissue attachments at the first metatarsophalangeal joint level. At this point, I felt like the first metatarsal head needed to be resected in order to get proper closure and soft tissue coverage over the region. I removed the first metatarsal head with power equipment. I also removed the tibial and fibular sesamoids to the region. The area was then copiously irrigated. 4-0 Vicryl was then used to close the capsular tissue over the remaining metatarsal shaft and distal stump. Skin was then closed with 3-0  nylon simple interrupted and horizontal mattress combination. Bleeders were clamped and bovied as required. There was no pulsatile flow emitted. There was good capillary bleeding, however. The patient was then dressed with sterile compressive dressing consisting of Xeroform gauze, 4 x 4's, ABD pad, Conform and Kerlix, along with an Ace wrap. The patient tolerated the procedure and anesthesia well and left the OR for the recovery room with vital signs stable and neurovascular status intact.     ____________________________ Gerrit Heck. Troxler, DPM mgt:dmm D: 07/18/2013 12:11:45 ET T: 07/18/2013 12:26:27 ET JOB#: [id number]  cc: Gerrit Heck Troxler, DPM, <Dictator> Perry Mount MD ELECTRONICALLY SIGNED 08/09/2013 8:31

## 2014-05-24 NOTE — Op Note (Signed)
PATIENT NAME:  [name], [name] MR#:  [id number] DATE OF BIRTH:  [birth year]  DATE OF PROCEDURE:  07/01/2013  PREOPERATIVE DIAGNOSES: 1.  End-stage renal disease.  2.  Poorly maturing right arm arteriovenous fistula with inability to use for access thus far.   POSTOPERATIVE DIAGNOSES:  1.  End-stage renal disease.  2.  Poorly maturing right arm arteriovenous fistula with inability to use for access thus far.   PROCEDURES: 1.  Ultrasound guidance for vascular access to left brachiocephalic AV fistula in a retrograde fashion.  2.  Right upper extremity fistula.  3.  Right upper extremity fistulogram and central venogram.  4.  Percutaneous transluminal angioplasty of perianastomotic stenosis with 7 mm diameter high-pressure angioplasty balloon.  5.  Percutaneous transluminal of mid upper arm cephalic vein stenosis with 9 mm diameter angioplasty balloon.   SURGEON: Algernon Huxley, M.D.   ANESTHESIA: Local with moderate conscious sedation.   ESTIMATED BLOOD LOSS: 25 mL.  CONTRAST USED: 30 mL Visipaque.   FLUOROSCOPY TIME: Approximately 3 minutes.   INDICATION FOR PROCEDURE: This is a 29 year old African American female with end-stage renal disease. Her access has not matured for use for dialysis. She has had a previous intervention, but since that time has been able to be accessed and used for dialysis. She is brought back for an angiogram for further evaluation and plans to treat any stenosis and improve the flow. Risks and benefits were discussed. Informed consent was obtained.   DESCRIPTION OF PROCEDURE: The patient is brought to the vascular suite. The right upper extremity was sterilely prepped and draped and a sterile surgical field was created. The fistula was accessed in the mid to proximal upper arm cephalic vein under direct ultrasound guidance without difficulty with a micropuncture needle and a permanent image was recorded. A micropuncture wire and sheath were then placed and  upsized to a 6-French sheath. Kumpe catheter was placed through the arterial anastomosis. There was a moderate stenosis just beyond the anastomosis from intimal hyperplasia. This was in the 50 to 60 percent range. In the mid upper arm cephalic vein was a large branch and what appeared to be a vein valve stenosis. It was difficult to delineate, but appeared to be at least moderate in nature. I treated the perianastomotic stenosis with a 7 mm diameter high-pressure angioplasty balloon with improvement of flow at the anastomosis, but there was still significant flow through this collateral and the vein valve stenosis in the mid upper arm cephalic vein was then treated with a 9 mm diameter angioplasty balloon. A tight waste was taken which resolved at about 10 to 12 atmospheres and completion angiogram following this showed improved flow with less flow at the collateral. The remainder of the central venous circulation was patent. At this point, I elected to terminate the procedure. The sheath was removed, 4-0 Monocryl pursestring suture was placed, pressure was held, and a sterile dressing was placed. The patient tolerated the procedure well and was taken the recovery room in stable condition.   ____________________________ Algernon Huxley, MD jsd:sb D: 07/01/2013 12:51:36 ET T: 07/01/2013 13:30:08 ET JOB#: [id number]  cc: Algernon Huxley, MD, <Dictator> Algernon Huxley MD ELECTRONICALLY SIGNED 07/01/2013 14:52

## 2014-05-25 NOTE — Op Note (Signed)
PATIENT NAME:  [name], HOGANCAMP MR#:  [id number] DATE OF BIRTH:  17-Jun-1985  DATE OF PROCEDURE:  07/25/2011  PREOPERATIVE DIAGNOSES:  1. End-stage renal disease with poorly functioning left arm AV fistula.  2. Diabetes mellitus.  3. Morbid obesity.  4. Hypertension.   POSTOPERATIVE DIAGNOSES: 1. End-stage renal disease with poorly functioning left arm AV fistula.  2. Diabetes mellitus.  3. Morbid obesity.  4. Hypertension.   PROCEDURES:  1. Ultrasound guidance for vascular access to left radiocephalic AV fistula.  2. Left upper extremity fistulogram.  3. Percutaneous transluminal angioplasty with paraanastomotic stenosis with 5 mm diameter angioplasty balloon.  4. Percutaneous transluminal angioplasty of mid forearm cephalic vein stenosis with 7 and 8 mm diameter angioplasty balloon.   SURGEON: Algernon Huxley, MD   ANESTHESIA: Local with moderate conscious sedation.   ESTIMATED BLOOD LOSS: 25 mL.  FLUOROSCOPY TIME: Two minutes.   CONTRAST USED: 25 mL.  INDICATION FOR PROCEDURE: The patient is a 29 year old African American female with end-stage renal disease and multiple medical comorbidities. She has a poorly functioning dialysis access with decreased flow and fistulogram is performed.   DESCRIPTION OF PROCEDURE: The patient is brought to the Vascular Interventional Radiology Suite. Left upper extremity was sterilely prepped and draped and a sterile surgical field was created. The fistula was accessed just below the antecubital fossa in a retrograde fashion and under direct ultrasound guidance permanent image was recorded. 6 French sheath was placed and 2500 units of intravenous heparin was given for systemic anticoagulation. A Kumpe catheter was placed at the anastomosis where a paraanastomotic stenosis appearing moderate to high-grade was seen in the mid forearm cephalic vein. This was a tapered stenosis in the 70 to 80% range. Just beyond this was a medium-sized branch that seemed  to be stealing a significant amount of flow from the fistula as well. This also correlated with the venous access site. The Magic torque wire was placed into the radial artery. The paraanastomotic stenosis was treated with a 5 mm diameter angioplasty balloon and the mid forearm cephalic vein stenosis was treated with a 7 mm diameter angioplasty balloon. The paraanastomotic stenosis looked better after initial angioplasty but there were still significant residual stenosis greater than 50% in the mid forearm and so I upsized to an 8 mm diameter angioplasty balloon with only a 20 to 30% residual stenosis. I think at some point considering treating the medium-sized branch with a coil embolization would be reasonable. Given the size of the branch, I would prefer use of a detachable coil which we did not have present today and so at this point I elected to terminate the procedure and she may be brought back for a subsequent intervention in the future. 4-0 Monocryl purse-string suture was placed around the sheath. The sheath was removed. Pressure was held. Sterile dressing was placed. The patient tolerated the procedure well and was taken to the recovery room in stable condition.    ____________________________ Algernon Huxley, MD jsd:drc D: 07/25/2011 09:04:52 ET T: 07/25/2011 09:48:47 ET JOB#: [id number]  cc: Algernon Huxley, MD, <Dictator>  Algernon Huxley MD ELECTRONICALLY SIGNED 07/28/2011 9:22

## 2014-05-25 NOTE — Op Note (Signed)
PATIENT NAME:  [name], [name] MR#:  [id number] DATE OF BIRTH:  1985-09-11  DATE OF PROCEDURE:  05/11/2011  PREOPERATIVE DIAGNOSES:  1. Complication of AV dialysis device with non-flow of dialysis catheter.  2. End-stage renal disease requiring hemodialysis.   POSTOPERATIVE DIAGNOSES:  1. Complication of AV dialysis device with non-flow of dialysis catheter.  2. End-stage renal disease requiring hemodialysis.   PROCEDURE PERFORMED: Removal of cuffed tunneled left IJ trialysis catheter.   SURGEON: Hortencia Pilar, MD  ASSISTANT: Real Cons, PA-C  ANESTHESIA: 1% local lidocaine infiltration.   DESCRIPTION OF PROCEDURE: The patient is brought to special procedures holding area. She is positioned supine. Her left neck and chest wall are prepped and draped in a sterile fashion. The cuff is located by palpation. The surrounding tissues are then infiltrated with 1% lidocaine with epinephrine. An 11 blade scalpel is used to make a small incision. The cuff is freed from the surrounding tissues, and the catheter is removed. 4-0 Monocryl is utilized. The exit site is dressed with antibiotic ointment and a sterile dressing. The patient tolerated the procedure well and there were no immediate complications.  ____________________________ Katha Cabal, MD ggs:slb D: 05/11/2011 14:09:20 ET T: 05/11/2011 15:09:24 ET JOB#: [id number]  cc: Katha Cabal, MD, <Dictator> Katha Cabal MD ELECTRONICALLY SIGNED 05/12/2011 14:28

## 2014-05-25 NOTE — Op Note (Signed)
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   PROCEDURES PERFORMED: Contrast injection left arm arteriovenous fistula.   SURGEON: Katha Cabal, MD   SEDATION: Versed 4 mg  plus fentanyl 150 mcg administered IV. Continuous ECG, pulse oximetry and cardiopulmonary monitoring was performed throughout the entire procedure by the Interventional Radiology nurse. Total sedation time is 45 minutes.   ACCESS: A 6-French sheath left wrist fistula.   CONTRAST USED: Isovue 30 mL.   FLUOROSCOPY TIME: 0.7 minutes.   INDICATIONS: [name] is a 29 year old woman who presents with failure of her left wrist fistula to mature. The risks and benefits for contrast injection to define the reason for failure of maturation was reviewed. All questions are answered, and the patient has agree to proceed.   DESCRIPTION OF PROCEDURE: The patient is taken to the Special Procedures suite, placed in the supine position. After adequate sedation is achieved, she is positioned with her arm extended palm upward and she is supine.   One percent lidocaine is infiltrated in the subcutaneous tissue. Ultrasound is placed in a sterile sleeve. The fistula is difficult to appreciate and is quite small; therefore, ultrasound is being used to avoid injury to the fistula. The fistula is then identified. Arterial anastomosis is localized, and 1% lidocaine is infiltrated and a micropuncture needle is used to access the fistula. A microwire followed by a micro sheath is inserted, a J-wire followed by a 6-French sheath. Hand injection of contrast is then utilized to demonstrate the fistula as well as reflux into the arterial system. Upper extremity venous anatomy is reviewed as well. Images are  inadequate of the central venous anatomy; and, therefore, first a 65 cm Kumpe-but this was not long enough, and, therefore, a 100 cm Kumpe was advanced into the central venous system, and magnified views of the central venous anatomy were obtained. After review of the images, pursestring suture of 4-0 Monocryl was placed around the sheath and the sheath is removed. Pressure is held, and there are no immediate complications.   INTERPRETATION: The fistula is very small, measuring approximately 3 or 4 mm in diameter. The anastomosis, although small, is appropriate for the size of the radial artery which appears to be 2 to 2.5 mm at most. There is irregularity noted just proximal to the anastomosis which could represent flow-limiting disease; however, the patient moved quite a bit during this injection secondary to the appreciation of the contrast in her arterial system causing her a burning sensation. The cephalic vein is widely patent in the upper arm. It appears to be a very good size and suitable for future fistula. Central venous anatomy is patent on magnified views in spite of the left IJ catheter.   SUMMARY: The patient has several potential etiologies for failure of maturation. Likely angioplasty of the radial artery is required.  A fistula based on the more "normal" radial artery in that area will not likely allow for maturation, and the patient will need something such as FLAIR stents placed to allow for an adequate access site. Alternatively, a new brachiocephalic fistula could be created as she does have adequate vein in the upper arm for fistula creation. She does have an intact palmar arch, and a normal ulnar artery, and does perfuse her fistula apparently in a retrograde fashion; but the  fistula should be under normal arterial pressure at its origin.    ____________________________ Katha Cabal, MD ggs:cbb D: 03/01/2011 17:05:31 ET T: 03/01/2011 17:48:09 ET JOB#: [id number]  cc: Katha Cabal, MD, <Dictator> Katha Cabal MD ELECTRONICALLY SIGNED 03/16/2011 11:24

## 2014-05-25 NOTE — Discharge Summary (Signed)
PATIENT NAME:  [name], [name] MR#:  [id number] DATE OF BIRTH:  May 04, 1985  DATE OF ADMISSION:  03/23/2011 DATE OF DISCHARGE:  03/24/2011  For a detailed note, please take a look at the history and physical done by Dr. Lyndel Safe on admission.   DIAGNOSES AT DISCHARGE:  1. Chest pain. Noncardiac in nature, suspected related to anxiety.  2. Hypertension.  3. End-stage renal disease on hemodialysis Tuesday, Thursday, Saturday.  4. Diabetes.   DIET: Patient was discharged on a low sodium, American Diabetic Association diet.   ACTIVITY: As tolerated.   FOLLOW UP: Follow up with [facility] internal medicine in the next 1 to 2 weeks.   DISCHARGE MEDICATIONS:  1. Renvela 800 mg 1 tab t.i.d. with meals.  2. Amlodipine 10 mg daily.  3. Novolin 70/30, 18 units b.i.d. with meals.  4. Labetalol 200 mg daily.   [place] COURSE: Dr. Murlean Iba from nephrology.   LABORATORY, DIAGNOSTIC AND RADIOLOGICAL DATA: Pertinent studies done during the hospital course are: Chest x-ray done on admission showing minimal atelectasis of the right middle lobe. Echocardiogram done showing left ventricular systolic function to be normal, ejection fraction of 60%, mild mitral regurgitation, mild tricuspid regurgitation. A stress test done showing indeterminate treadmill EKG due to baseline EKG changes, decreased exercise tolerance for age. Normal LV systolic function with ejection fraction of 58%. Normal myocardial perfusion without evidence of myocardial ischemia.   HOSPITAL COURSE: This is a 29 year old female history of diabetes, hypertension, end-stage renal disease on hemodialysis presented to the hospital with chest pain.  1. Chest pain. Given the fact that patient has a history of diabetes and renal disease she was observed overnight on observation unit, had three sets of cardiac markers checked which were negative. She did not have any further episodes of chest pain during her hospital course or any  exacerbation. She underwent a stress test which did not show any evidence of myocardial ischemia. She is currently pain free and hemodynamically stable and therefore being discharged home.  2. End-stage renal disease on hemodialysis. Patient normally gets dialysis Tuesday, Thursday, Saturday. She actually missed her dialysis today. Nephrology was consulted. They contacted the patient's normal dialysis unit. She is going to get half her dialysis tomorrow and resume her dialysis back on Saturday again. There was no urgent need for dialysis while she was in the hospital. 3. Diabetes. Patient was maintained on her Novolin 70/30 and she will resume that.  4. Hypertension. Patient was maintained on her labetalol and  Norvasc and she will resume that upon discharge too.  5. CODE STATUS: Patient is a FULL CODE.   TIME SPENT: 35 minutes.  ____________________________ Belia Heman. Verdell Carmine, MD vjs:cms D: 03/24/2011 15:00:28 ET T: 03/25/2011 08:28:05 ET JOB#: [id number]  cc: Belia Heman. Verdell Carmine, MD, <Dictator> Henreitta Leber MD ELECTRONICALLY SIGNED 03/25/2011 16:11

## 2014-05-25 NOTE — H&P (Signed)
PATIENT NAME:  [name], LANGMAN MR#:  [id number] DATE OF BIRTH:  1985/06/29  DATE OF ADMISSION:  03/23/2011  PRIMARY CARE PHYSICIAN: Dr. Jorje Guild at [place]: [place]: Chest pain and palpitations.   HISTORY OF PRESENT ILLNESS: The patient is a 29 year old female with multiple medical problems. She has been a type I diabetic for about 18 years, with hypertension, hyperlipidemia, and endstage kidney disease on hemodialysis Tuesday, Thursday, and Saturday. She was started on hemodialysis in July 2012 with left arm AV fistula. Today she presented to the Emergency Room with chest pain and palpitations. She said she woke up this morning and she was complaining of left-sided chest pain. She was complaining of palpitations and fluttering in her chest.  She said her heart was beating irregularly and it was fast as if it was fluttering. She also noticed some dizziness, diaphoresis, and extreme shortness of breath at that time. She describes the chest pain as sharp-like chest pain, also with some pleuritic component to it. She said she had an irregular heartbeat in the past but has no documented atrial fibrillation in the past. She has no history of myocardial infarction or documented coronary artery disease in the past. She never had a stress test or cardiac catheterization in the past. She has no history of pulmonary embolism in the past. She said she went to dialysis today for extra treatment because her weight was more than her dry weight. She was feeling dizzy and having chest pain at that time so the dialysis center called EMS and they never started her on dialysis today because of her ongoing chest pain and palpitations. She says her chest pain has improved right now, almost resolved. Her palpitations have improved in the Emergency Room. Her initial work-up in the Emergency Room shows negative chest x-ray, negative troponin.  She has a normal  potassium of 4.2. She said she had some nausea and vomiting for the last two days but that has resolved. She said she has nausea and vomiting off and on.  She denies any abdominal pain. She was complaining of some headache yesterday but no headache at this time. She has an abnormal baseline EKG so she is being admitted for chest pain and palpitations with underlying diabetes, hypertension, chronic kidney disease, morbid obesity, and endstage renal disease.   REVIEW OF SYSTEMS: She denies any fever, fatigue, or weakness. She has poor vision, most likely because of diabetes, but no change in vision. She denies any ear pain, hearing loss, or epistaxis.  No cough. No dyspnea. She presents with chest pain and some palpitations but no syncope. She was complaining of some nausea and vomiting yesterday. No abdominal pain. No GI bleed. She makes some urine. She denies any dysuria or frequency. She has menorrhagia and she follows with GYN. She says she takes a pill for menorrhagia. No thyroid problems. She was complaining of diaphoresis today with the chest pain. No history of anemia.  No rash. No joint pains. She has an ulcer on the left foot. No focal numbness or weakness. No anxiety or depression.   PAST MEDICAL HISTORY:  1. Diabetes with diabetic retinopathy. She has been a type 1 diabetic since 29 years of age, she said since grade five. 2. Hypertension.  3. Hyperlipidemia.  4. Endstage kidney disease on hemodialysis Tuesday, Thursday, Saturday. She started hemodialysis in July 2012. 5. She was last admitted at [facility] internal medicine from 02/06/2011 to 02/12/2011 for a  line infection because of her hemodialysis catheter, which has been removed. She has completed her antibiotic course. She said her last dose of antibiotics was on 02/22/2011.  She now has a left AV fistula.  6. History of asthma.   PAST SURGICAL HISTORY:  1. She said she had multiple eye surgeries.  2. Left arm AV fistula.   ALLERGIES TO  MEDICATIONS: Vancomycin and latex.  HOME MEDICATIONS: As per last discharge:  1. Renal vitamin once daily.  2. Novolin 70/30, 18 units twice a day. 3. Norvasc 10 mg daily at bedtime.  4. Labetalol 200 mg b.i.d. 5. Renvela 800 mg 3 times a day.  She is supposed to take aspirin but she does not take aspirin.   SOCIAL HISTORY: She lives with her parents. She smokes about five cigarettes a day. No alcohol or drug use. She is not employed.   FAMILY HISTORY: She says her mother has heart disease and she had small myocardial infarction in the past. Also history of hypertension and hyperlipidemia and the family.   PHYSICAL EXAMINATION:  VITAL SIGNS: When she presented to the Emergency Room, heart rate of 122, respiratory rate 20, blood pressure was 167/94, saturating 98% on room air. Currently her heart rate is 104, blood pressure 161/94, saturating 98% on room air.  GENERAL:  This is a morbidly obese young African American female comfortably sitting in bed, in no acute distress. Chest pain has resolved right now.  HEENT: Bilateral pupils are equal. Extraocular muscles intact. No scleral icterus. No conjunctivitis. Oral mucosa is moist. No pallor.   NECK: No thyroid tenderness, enlargement, or nodule. Neck is supple. No masses. Nontender. No adenopathy. No JVD. No carotid bruit.   CHEST: Bilateral breath sounds are clear. No wheeze. Normal effort.  No respiratory distress.   HEART: Heart sounds are regular. No murmur. Has some tachycardia.   ABDOMEN: Soft, nontender. Normal bowel sounds. No hepatosplenomegaly. No bruit. No masses.   RECTAL: Deferred.   NEUROLOGIC: She is awake, alert, and oriented to time, place, and person. Cranial nerves are intact. Moving all extremities against gravity.   EXTREMITIES: No cyanosis or clubbing.  She has decreased distal pulses, dorsalis pedis on the left side, maybe some peripheral vascular disease.   SKIN: She has dry eczematous skin of the lower  extremity and she has a dry left foot ulcer on the base of the left big toe with no discharge.  LABORATORY, DIAGNOSTIC, AND RADIOLOGICAL DATA: White count of 7.5, hemoglobin 12.4, platelet count 269,000. BMP sodium 132, potassium 4.2, BUN 35, creatinine 7.05, glucose 386, bicarbonate of 31, gap of 10. LFTs are normal. Her troponin is 0.03, CPK 120, MB 2.8. INR 0.9. Her chest x-ray is reported as minimal atelectasis of the right middle lobe.  Dual-lumen venous catheter is present. EKG shows sinus tachycardia at 109. She has T- wave inversions in lateral lead 1 and 3,  which are old. There are no acute ischemic changes.   IMPRESSION:  1. Atypical chest pain, palpitations, with underlying chronic diabetes, hypertension, and endstage kidney disease. Rule out myocardial infarction. Rule out coronary artery disease. 2. Long-standing diabetes along with retinopathy and nephropathy. 3. Endstage renal disease on hemodialysis Tuesday, Thursday, Saturday.  4. Hypertension.  5. Hyperlipidemia.  6. Morbid obesity. 7. Recent line infection because of hemodialysis catheter.   PLAN: 29 year old female, morbidly obese, with history of diabetes for the last 18 years, hypertension, hyperlipidemia, endstage kidney disease, and a smoker. She presents with chest pain with palpitations.  Her heart rate was documented at 122 when she presented to the Emergency Room but her EKG shows sinus tachycardia, no documented atrial fibrillation. We will get serial cardiac enzymes. We will start her on aspirin. We will continue her Norvasc and labetalol. We will hold off anticoagulation at this time. She has history of menorrhagia also. If  her enzymes turn positive then she needs to be started on anticoagulation. We will check a lipid profile. We will admit her to telemetry to watch for any arrhythmia. We will check an echocardiogram also and if her cardiac enzymes are negative, we will order a Myoview for tomorrow. We will admit her to  observation status.  The plan was discussed with the patient. We will continue her labetalol and Norvasc for hypertension. Continue Novolin 70/30 for her diabetes. We will call a nephrology consult also as the patient is a hemodialysis candidate.  The patient will need regular hemodialysis tomorrow.  TIME SPENT ON ADMISSION AND COORDINATION OF CARE: 55 minutes.  ____________________________ Mena Pauls, MD ag:bjt D: 03/23/2011 14:10:41 ET T: 03/23/2011 15:01:19 ET JOB#: [id number]  cc: Mena Pauls, MD, <Dictator> Dr. Jorje Guild, [facility] Internal Medicine  Mena Pauls MD ELECTRONICALLY SIGNED 04/02/2011 13:16

## 2014-05-25 NOTE — Op Note (Signed)
PATIENT NAME:  [name], DORIN MR#:  [id number] DATE OF BIRTH:  1985/04/12  DATE OF PROCEDURE:  03/30/2011  PREOPERATIVE DIAGNOSES: 1. Endstage renal disease.  2. Poorly functioning left arm AV fistula with prolonged bleeding.  3. Severe hypertension.  4. Diabetes.   POSTOPERATIVE DIAGNOSES: 1. Endstage renal disease.  2. Poorly functioning left arm AV fistula with prolonged bleeding.  3. Severe hypertension.  4. Diabetes.   PROCEDURE: 1. Ultrasound guidance for vascular access to left radiocephalic AV fistula.  2. Left upper extremity fistulogram.  3. Percutaneous transluminal angioplasty of a vein valve stenosis in the cephalic vein just above the antecubital fossa with  6-mm high-pressure and 7-mm conventional angioplasty balloons.   SURGEON: Algernon Huxley, M.D.   ANESTHESIA: Local with moderate conscious sedation.   ESTIMATED BLOOD LOSS: Minimal.   INDICATION FOR PROCEDURE: This is a 29 year old African American female with endstage renal disease.  She has a left radiocephalic AV fistula. Apparently there has been some prolonged bleeding. She has had previous coil embolization of branches. She is brought back for a fistulogram with possible intervention.   DESCRIPTION OF PROCEDURE: The patient was brought to the vascular interventional radiology suite. The left upper extremity was sterilely prepped and draped and a sterile surgical field was created. A few centimeters beyond the anastomosis we accessed the cephalic vein with a micropuncture needle under direct ultrasound guidance. Permanent image was recorded. Micropuncture wire and sheath were then placed. Imaging performed through the micropuncture sheath showed a moderate size branch in the mid forearm which then drained back into the upper arm cephalic vein. There was some mild sluggish flow around the catheter but no significant stenoses. In the central venous portion just above the antecubital fossa was a sclerotic vein valve that  appeared to have a moderate stenosis in the 60% range. We up-sized to a 6 [place] sheath and gave a small dose of intravenous heparin and crossed this lesion with a Magic torque wire and treated with 7-mm diameter conventional and 6-mm diameter high-pressure angioplasty balloons. A tight waist was taken at this location which improved with angioplasty. Following  angioplasty this area now appeared to be patent with mild residual stenosis. There was some significant spasm within the AV fistula. I considered embolizing the branch, but elected not to after the significant spasm developed and felt it would be prudent to leave it be and not potentially induce a thrombosis of the fistula.  At this point I elected to terminate the procedure. A 4-0 Monocryl pursestring suture was placed around the sheath. The sheath was removed. Pressure was held. Sterile dressing was placed. The patient tolerated the procedure well.    ____________________________ Algernon Huxley, MD jsd:bjt D: 03/30/2011 13:36:57 ET T: 03/30/2011 13:54:30 ET JOB#: [id number]  cc: Algernon Huxley, MD, <Dictator> Algernon Huxley MD ELECTRONICALLY SIGNED 03/31/2011 12:08

## 2014-05-30 IMAGING — XA IR VASCULAR PROCEDURE
13 of 15 series · 15 of 24 positions shown · IV contrast (IODINE)
Comparison: none

[Series 1: care upper arm · 1 of 2 slices shown (1 of 11)]
[im 1/2]
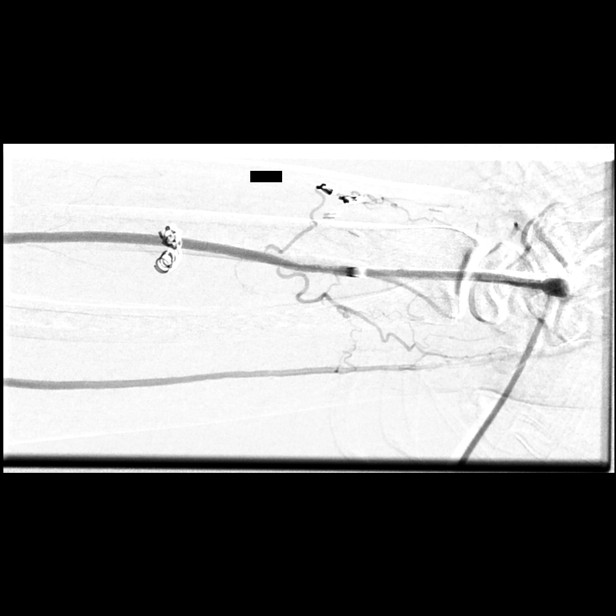

[Series 3: care upper arm · 1 of 2 slices shown (2 of 11)]
[im 1/2]
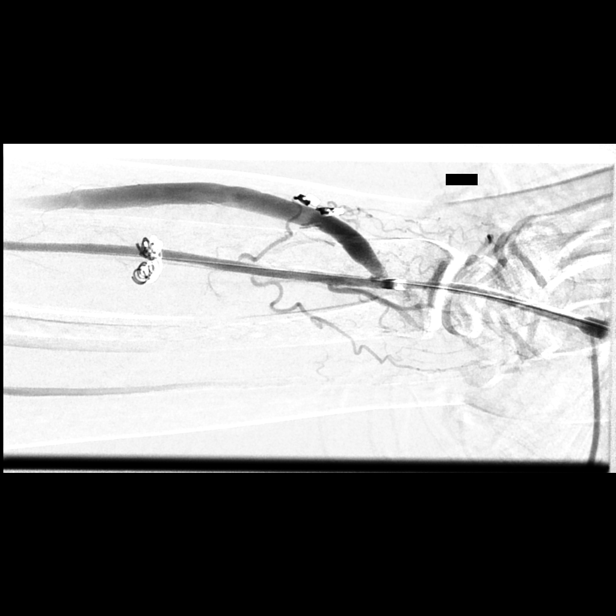

[Series 4: care upper arm · 1 of 2 slices shown (3 of 11)]
[im 2/2]
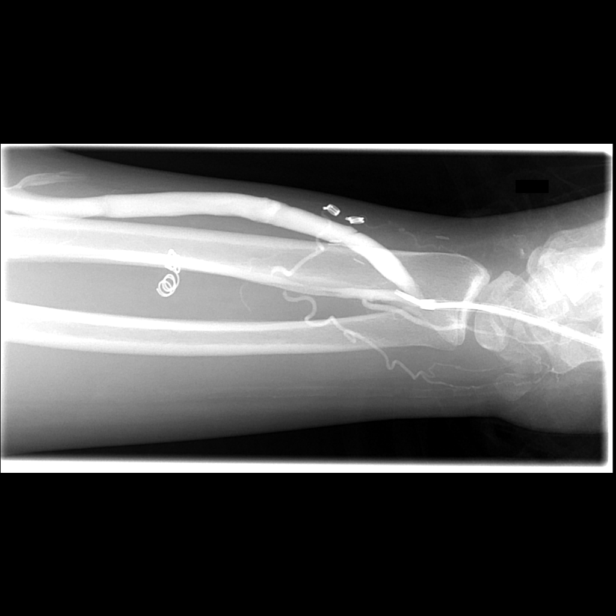

[Series 5: care upper arm · 1 of 2 slices shown (4 of 11)]
[im 1/2]
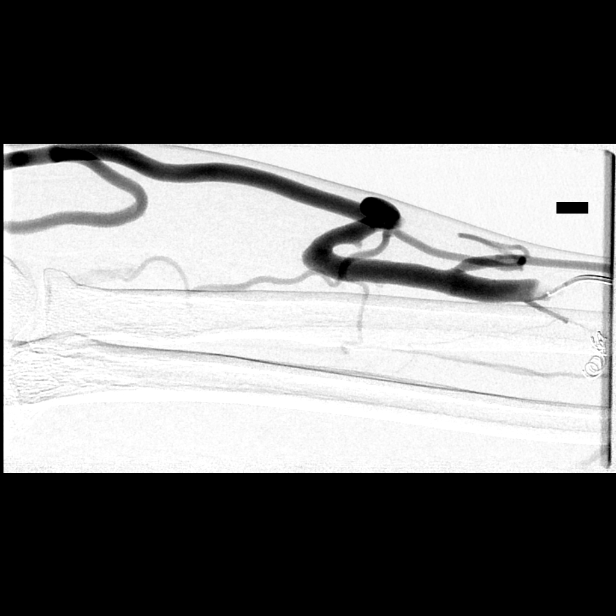

[Series 6: care upper arm · 2 of 2 slices shown (5 of 11)]
[im 1/2]
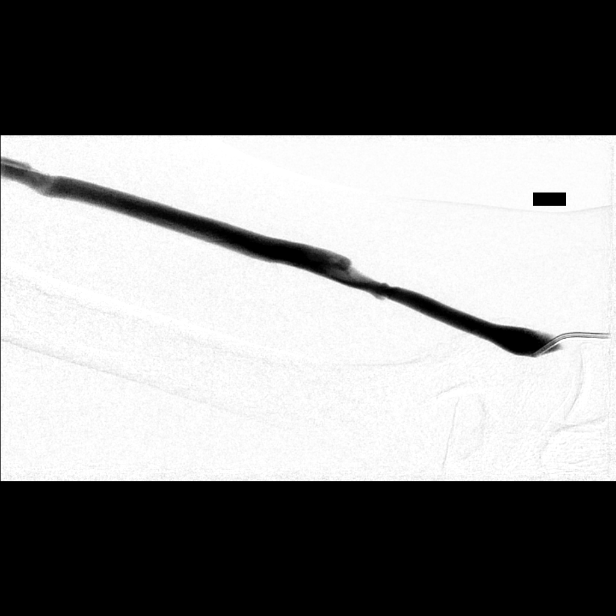
[im 2/2]
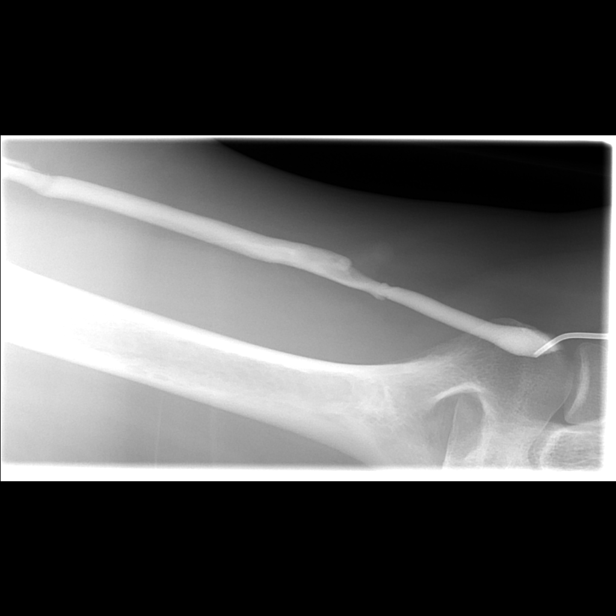

[Series 7: care upper arm · 1 of 2 slices shown (6 of 11)]
[im 2/2]
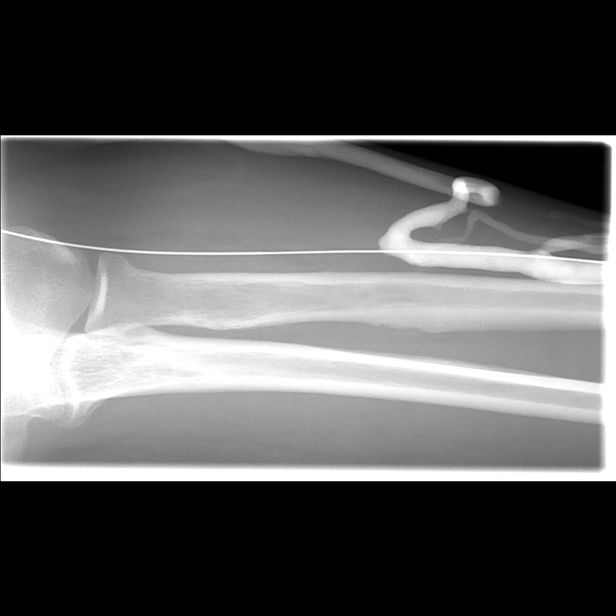

[Series 9: fl - angio · 1 of 1 slices shown (1 of 2)]
[im 1/1]
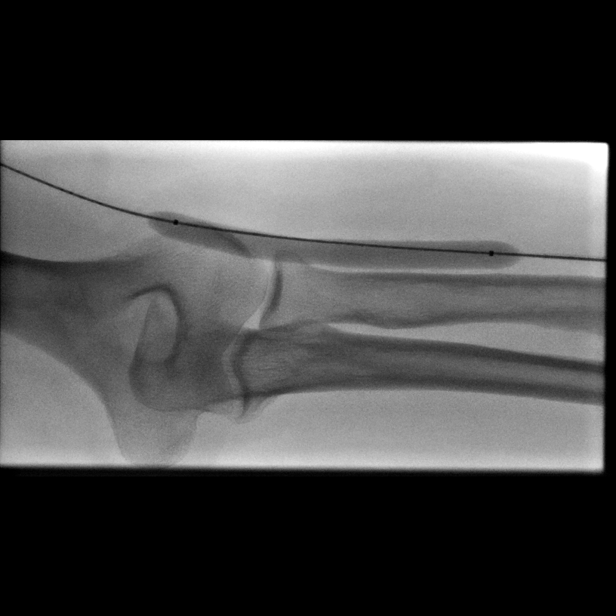

[Series 10: care upper arm · 1 of 2 slices shown (7 of 11)]
[im 1/2]
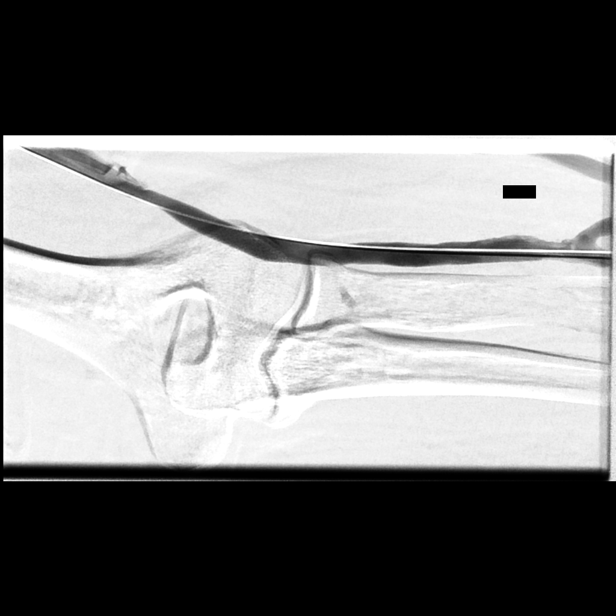

[Series 11: fl - angio · 1 of 1 slices shown (2 of 2)]
[im 1/1]
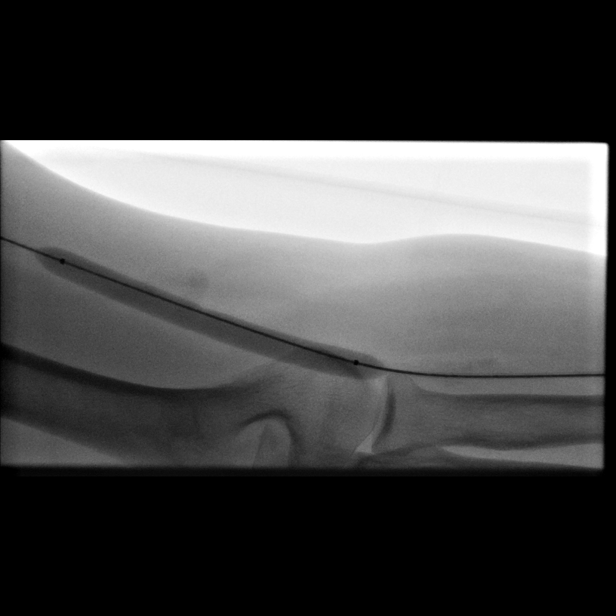

[Series 12: care upper arm · 1 of 2 slices shown (8 of 11)]
[im 1/2]
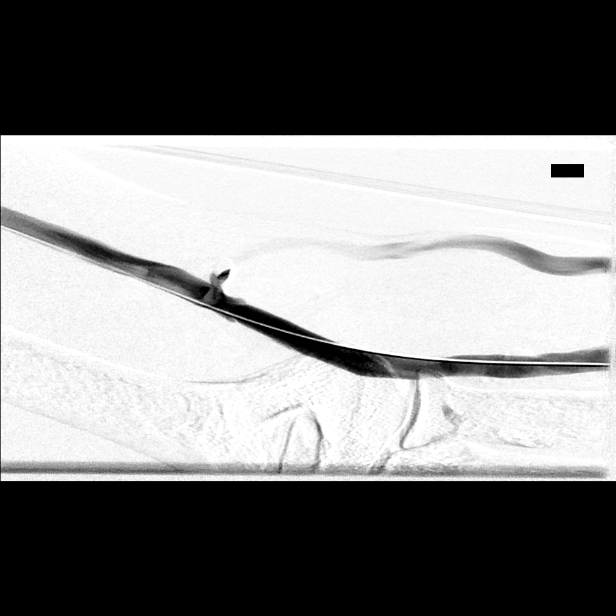

[Series 13: care upper arm · 1 of 2 slices shown (9 of 11)]
[im 1/2]
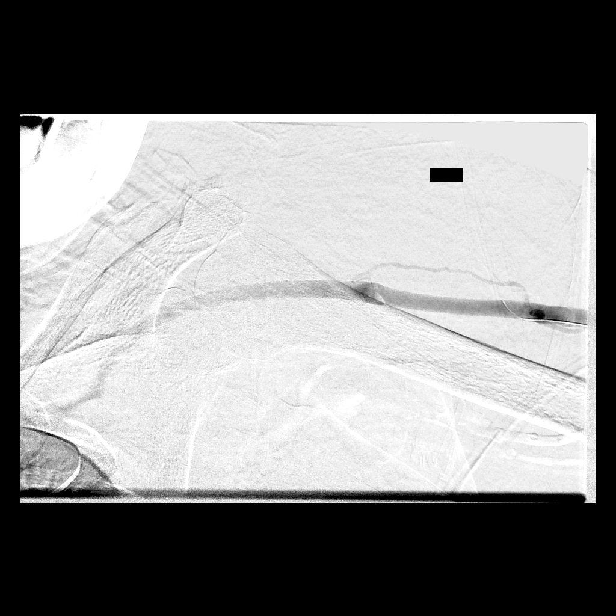

[Series 14: care upper arm · 2 of 2 slices shown (10 of 11)]
[im 1/2]
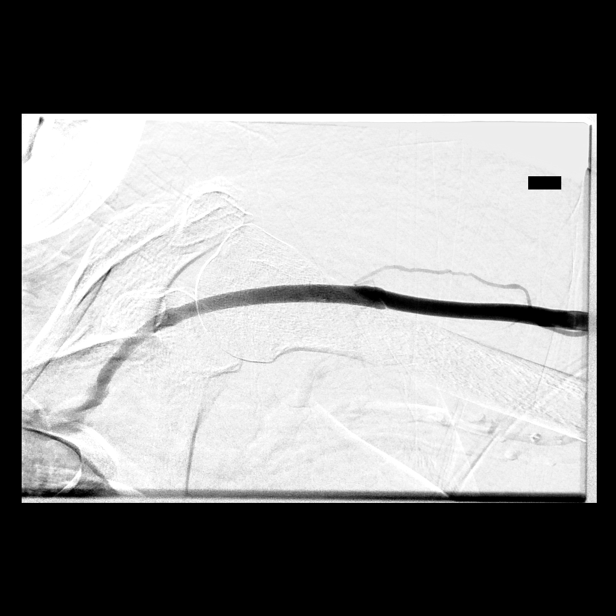
[im 2/2]
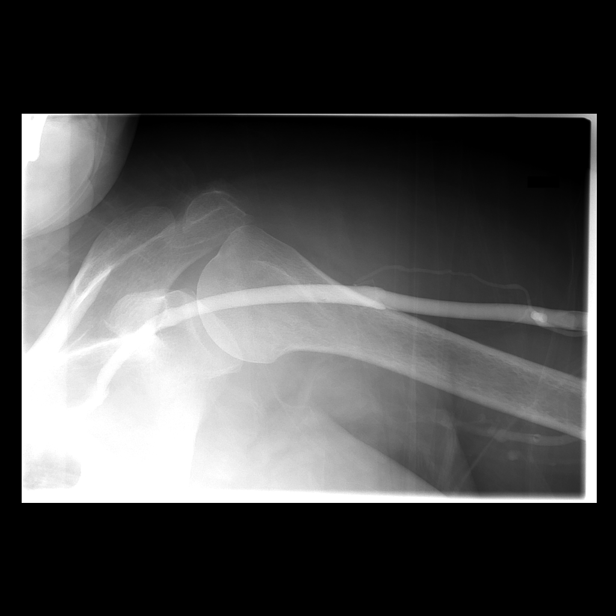

[Series 15: care upper arm · 1 of 2 slices shown (11 of 11)]
[im 2/2]
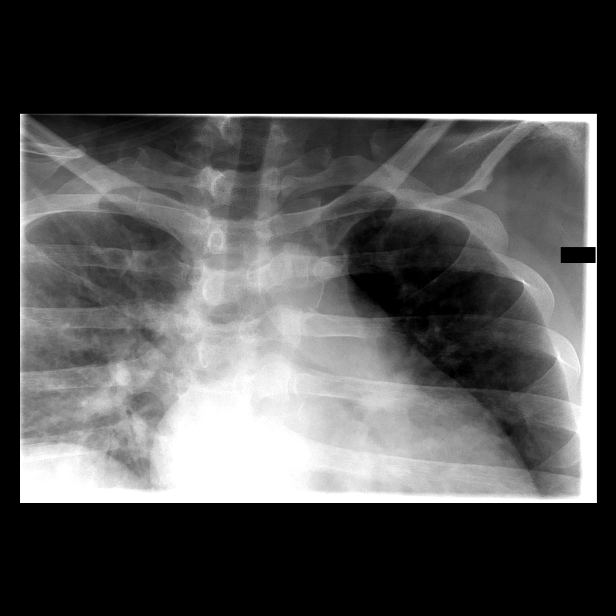

[15 of 24 positions shown; findings below may reference images not displayed]

IMAGES IMPORTED FROM THE [employer] WORKFLOW SYSTEM
NO DICTATION FOR STUDY

## 2014-06-01 NOTE — Op Note (Signed)
PATIENT NAME:  [name], [name] MR#:  [id number] DATE OF BIRTH:  [birth year]  DATE OF PROCEDURE:  02/03/2014  PREOPERATIVE DIAGNOSES:  1. End-stage renal disease.  2. Poorly functioning right arm arteriovenous fistula.   POSTOPERATIVE DIAGNOSES:  1. End-stage renal disease. 2. Poorly functioning right arm arteriovenous fistula.   PROCEDURES: 1. Ultrasound guidance for vascular access to right brachiocephalic arteriovenous fistula.  2. Right upper extremity fistulogram and central venogram.  3. Percutaneous transluminal angioplasty of peri-anastomotic stenosis with 6 mm diameter Lutonix drug-coated angioplasty balloon.   SURGEON: Algernon Huxley, M.D.   ANESTHESIA: Local with moderate conscious sedation.   ESTIMATED BLOOD LOSS: Minimal.   INDICATION FOR PROCEDURE: This is a 29 year old female with end-stage renal disease. She has a right brachiocephalic arteriovenous fistula that is not flowing properly. A previous ultrasound several months ago showed a stenosis near the anastomosis, and she is brought in for a fistulogram now due to her diminished fistula function. The risks and benefits were discussed. Informed consent was obtained.   DESCRIPTION OF PROCEDURE: The patient was brought to the vascular suite. The right upper extremity was sterilely prepped and draped, and a sterile surgical field was created. The fistula was accessed between the 2 access sites, in a retrograde fashion under direct ultrasound guidance without difficulty with a micropuncture needle, and a permanent image was recorded after ultrasound-guided access. A micropuncture wire and sheath were then placed. Imaging was originally performed through the micropuncture sheath, which showed a high-grade stenosis in the 85% range just beyond the anastomosis in the cephalic vein. To treat this lesion, I upsized to a 6 [place] sheath and gave the patient 3000 units of intravenous heparin. I crossed the lesion without difficulty and  parked a wire past the brachial bifurcation into the ulnar artery. I selected a 6 mm diameter x 6 cm length Lutonix drug-coated angioplasty balloon. I took this into the brachial artery beyond the lesion and across the anastomosis throughout the first 2 cm in the cephalic vein where the lesion is present. This was inflated to 12 atmospheres and the balloon was deflated, and completion angiogram showed the area to be markedly improved with only about a 15% to 20% residual stenosis. I then performed imaging of the remainder of the fistula and the central veins, which were all patent and did not require any intervention with no significant stenosis.   At this point, I elected to terminate the procedure. The sheath was removed. A 4-0 Monocryl pursestring suture was placed. Pressure was held. Sterile dressings were placed. The patient tolerated the procedure well and was taken to the recovery room in stable condition.     ____________________________ Algernon Huxley, MD jsd:JT D: 02/03/2014 11:27:45 ET T: 02/03/2014 13:32:12 ET JOB#: [id number]  cc: Algernon Huxley, MD, <Dictator> Algernon Huxley MD ELECTRONICALLY SIGNED 02/11/2014 13:40

## 2014-07-06 IMAGING — XA IR VASCULAR PROCEDURE
15 of 22 series · 15 of 24 positions shown · IV contrast (IODINE)
Comparison: none

[Series 1: care upper arm · 1 of 2 slices shown (1 of 15)]
[im 1/2]
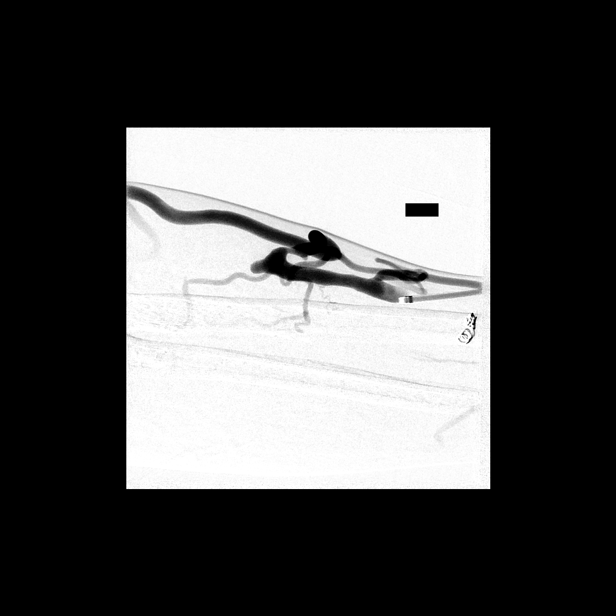

[Series 2: care upper arm · 1 of 2 slices shown (2 of 15)]
[im 1/2]
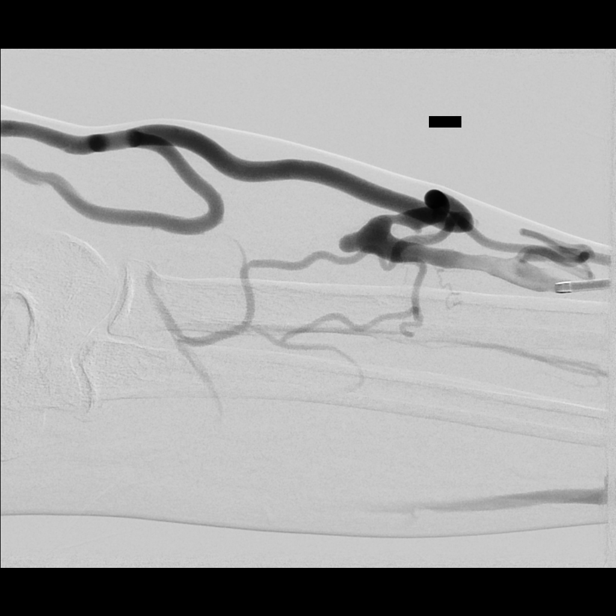

[Series 3: care upper arm · 1 of 2 slices shown (3 of 15)]
[im 1/2]
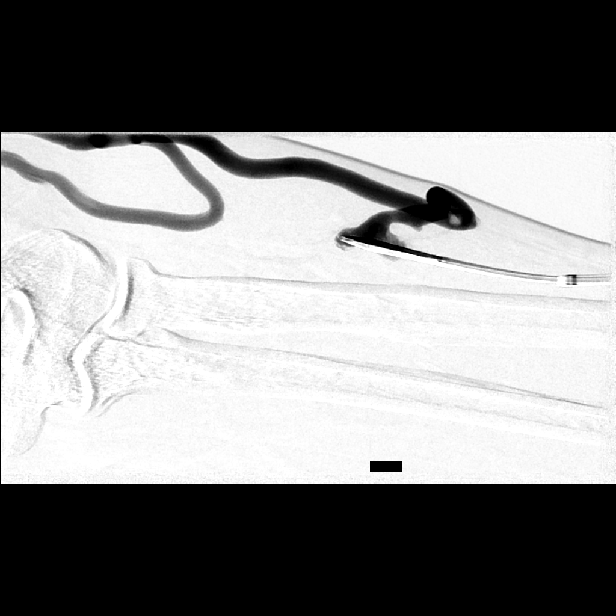

[Series 4: care upper arm · 1 of 2 slices shown (4 of 15)]
[im 1/2]
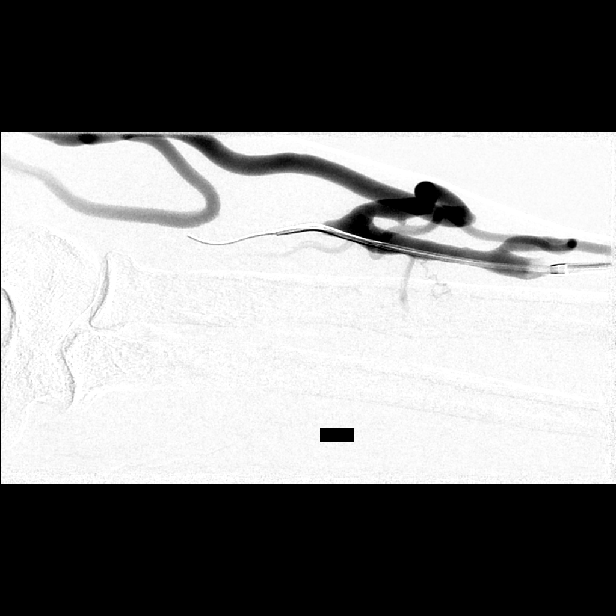

[Series 6: care upper arm · 1 of 2 slices shown (5 of 15)]
[im 1/2]
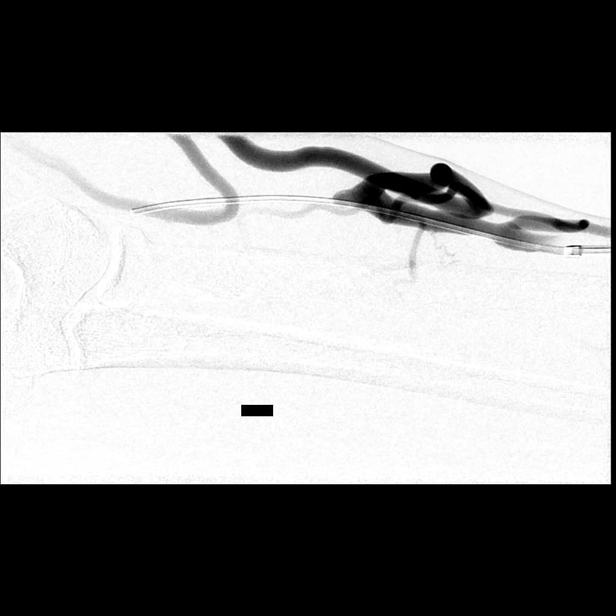

[Series 7: care upper arm · 1 of 1 slices shown (6 of 15)]
[im 1/1]
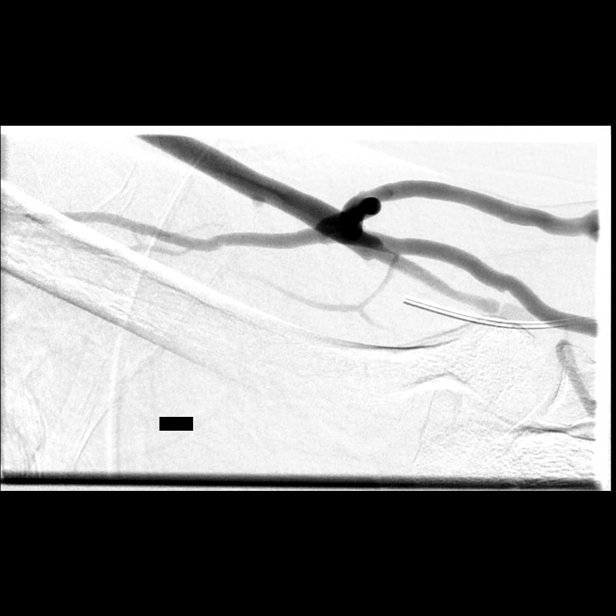

[Series 9: care upper arm · 1 of 2 slices shown (7 of 15)]
[im 1/2]
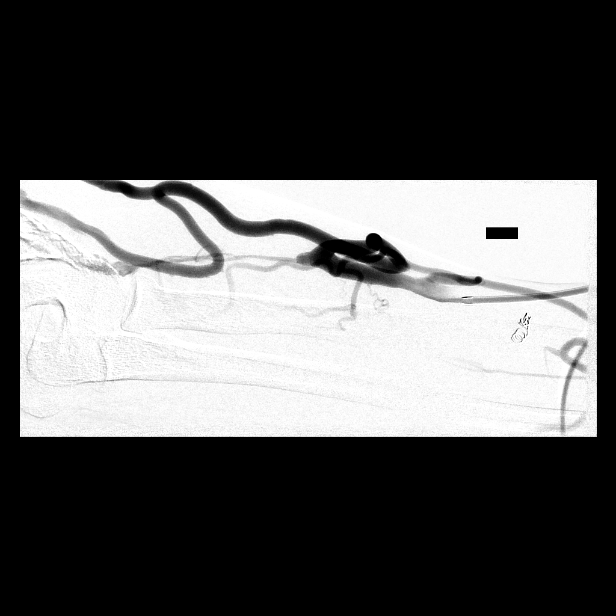

[Series 11: care upper arm · 1 of 2 slices shown (8 of 15)]
[im 1/2]
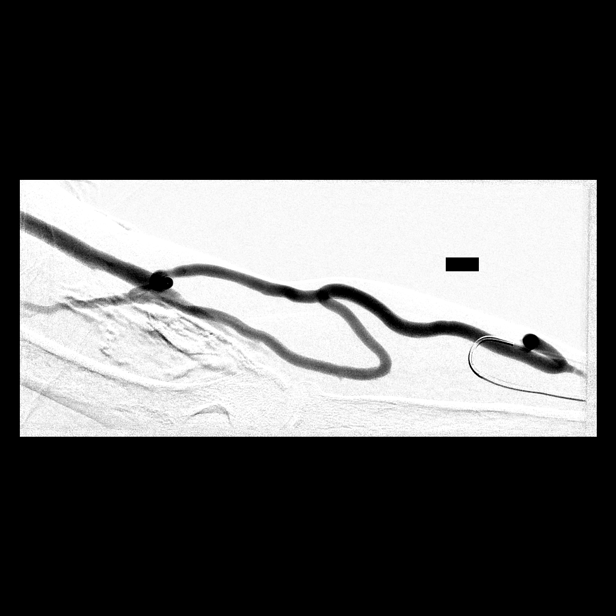

[Series 12: care upper arm · 1 of 1 slices shown (9 of 15)]
[im 1/1]
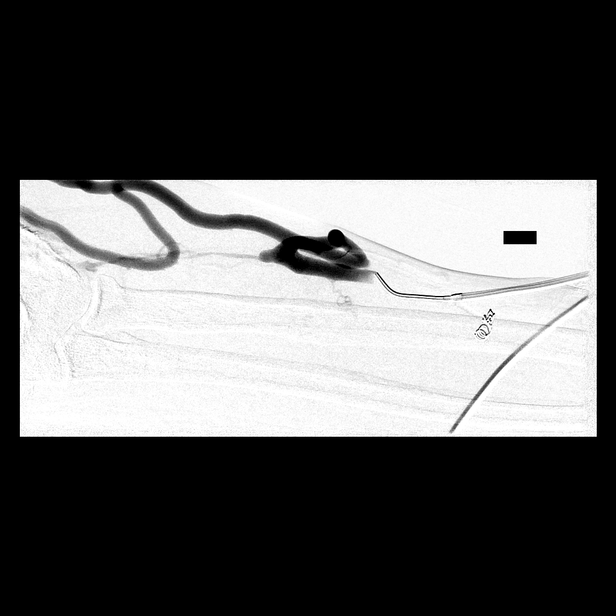

[Series 14: care upper arm · 1 of 1 slices shown (10 of 15)]
[im 1/1]
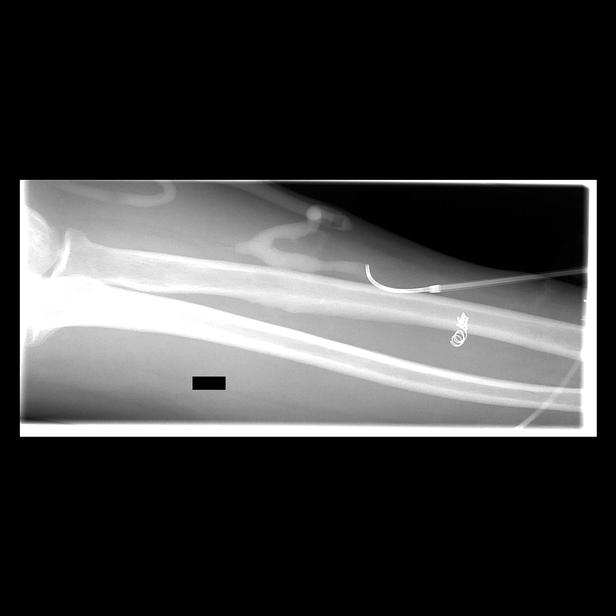

[Series 15: care upper arm · 1 of 2 slices shown (11 of 15)]
[im 1/2]
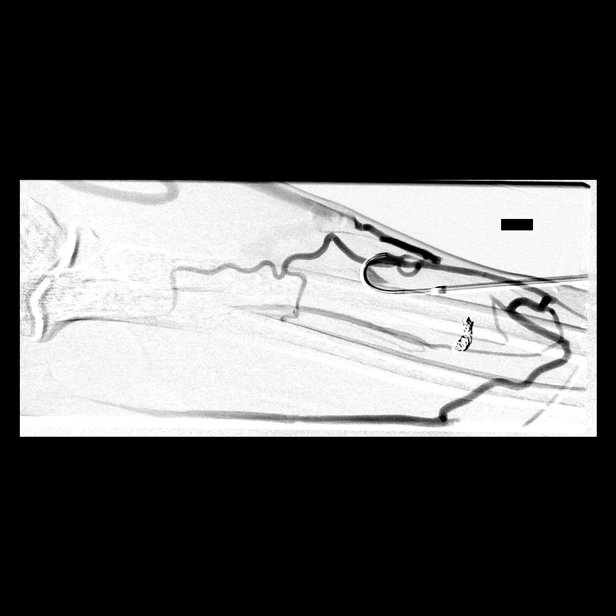

[Series 17: care upper arm · 1 of 2 slices shown (12 of 15)]
[im 1/2]
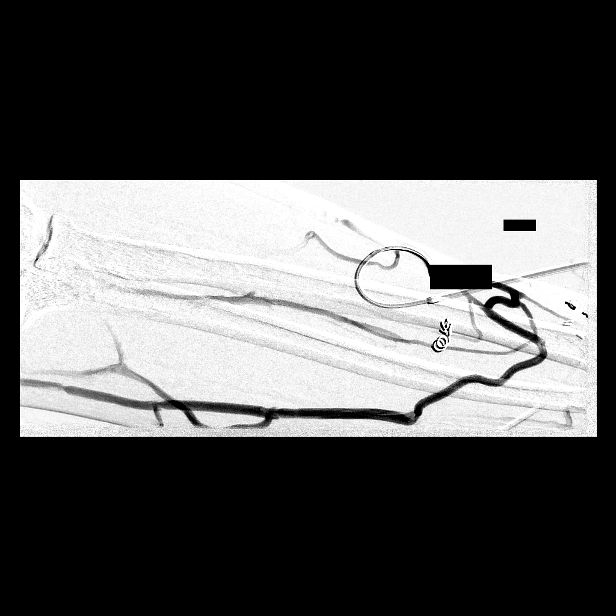

[Series 19: care upper arm · 1 of 2 slices shown (13 of 15)]
[im 1/2]
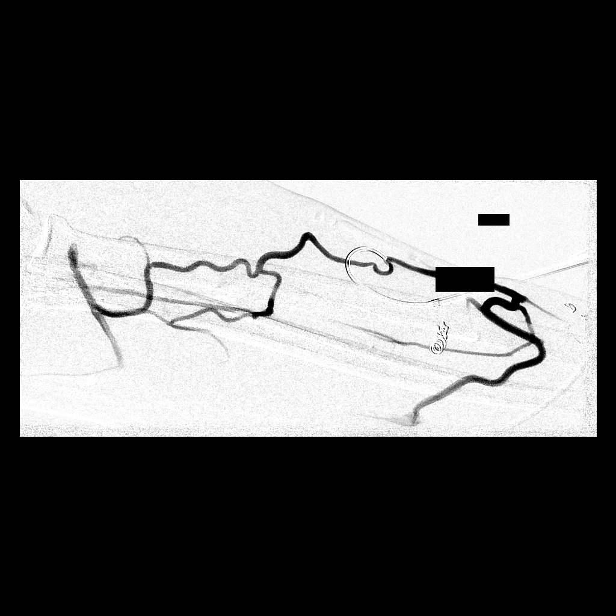

[Series 20: care upper arm · 1 of 2 slices shown (14 of 15)]
[im 1/2]
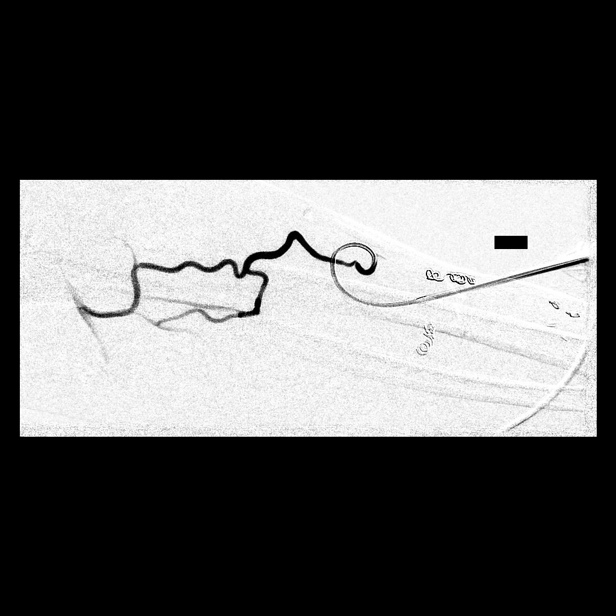

[Series 22: care upper arm · 1 of 2 slices shown (15 of 15)]
[im 1/2]
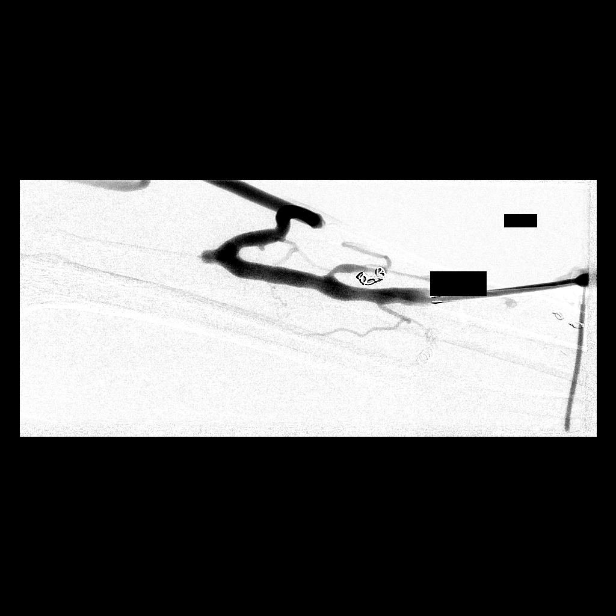

[15 of 24 positions shown; findings below may reference images not displayed]

IMAGES IMPORTED FROM THE [employer] WORKFLOW SYSTEM
NO DICTATION FOR STUDY

## 2014-07-07 ENCOUNTER — Encounter: Payer: Self-pay | Admitting: *Deleted

## 2014-07-07 ENCOUNTER — Encounter
Admission: RE | Disposition: A | Discharge status: Home / Self Care | Payer: Self-pay | Source: Ambulatory Visit | Attending: Vascular Surgery

## 2014-07-07 ENCOUNTER — Ambulatory Visit
Admission: RE | Admit: 2014-07-07 | Discharge: 2014-07-07 | Disposition: A | Discharge status: Home / Self Care | Payer: Medicare Other | Source: Ambulatory Visit | Attending: Vascular Surgery | Admitting: Vascular Surgery

## 2014-07-07 DIAGNOSIS — E1122 Type 2 diabetes mellitus with diabetic chronic kidney disease: Secondary | ICD-10-CM | POA: Diagnosis not present

## 2014-07-07 DIAGNOSIS — Z6836 Body mass index (BMI) 36.0-36.9, adult: Secondary | ICD-10-CM | POA: Insufficient documentation

## 2014-07-07 DIAGNOSIS — T829XXA Unspecified complication of cardiac and vascular prosthetic device, implant and graft, initial encounter: Secondary | ICD-10-CM

## 2014-07-07 DIAGNOSIS — Z992 Dependence on renal dialysis: Secondary | ICD-10-CM | POA: Insufficient documentation

## 2014-07-07 DIAGNOSIS — N186 End stage renal disease: Secondary | ICD-10-CM | POA: Diagnosis not present

## 2014-07-07 DIAGNOSIS — F17200 Nicotine dependence, unspecified, uncomplicated: Secondary | ICD-10-CM | POA: Insufficient documentation

## 2014-07-07 DIAGNOSIS — I499 Cardiac arrhythmia, unspecified: Secondary | ICD-10-CM | POA: Insufficient documentation

## 2014-07-07 DIAGNOSIS — J45909 Unspecified asthma, uncomplicated: Secondary | ICD-10-CM | POA: Diagnosis not present

## 2014-07-07 DIAGNOSIS — T82858A Stenosis of vascular prosthetic devices, implants and grafts, initial encounter: Secondary | ICD-10-CM | POA: Insufficient documentation

## 2014-07-07 DIAGNOSIS — I120 Hypertensive chronic kidney disease with stage 5 chronic kidney disease or end stage renal disease: Secondary | ICD-10-CM | POA: Insufficient documentation

## 2014-07-07 HISTORY — PX: PERIPHERAL VASCULAR CATHETERIZATION: SHX172C

## 2014-07-07 LAB — GLUCOSE, CAPILLARY: GLUCOSE-CAPILLARY: 265 mg/dL — AB (ref 65–99)

## 2014-07-07 LAB — POTASSIUM (ARMC VASCULAR LAB ONLY): Potassium (ARMC vascular lab): 4.6

## 2014-07-07 SURGERY — A/V SHUNTOGRAM/FISTULAGRAM
Anesthesia: Moderate Sedation

## 2014-07-07 MED ORDER — SODIUM CHLORIDE 0.9 % IJ SOLN
INTRAMUSCULAR | Status: AC
  Filled 2014-07-07: qty 15

## 2014-07-07 MED ORDER — FENTANYL CITRATE (PF) 100 MCG/2ML IJ SOLN
INTRAMUSCULAR | Status: AC
  Filled 2014-07-07: qty 2

## 2014-07-07 MED ORDER — HEPARIN (PORCINE) IN NACL 2-0.9 UNIT/ML-% IJ SOLN
INTRAMUSCULAR | Status: AC
  Filled 2014-07-07: qty 1000

## 2014-07-07 MED ORDER — MIDAZOLAM HCL 2 MG/2ML IJ SOLN
INTRAMUSCULAR | Status: DC | PRN
  Administered 2014-07-07: 1 mg via INTRAVENOUS
  Administered 2014-07-07 (×2): 2 mg via INTRAVENOUS

## 2014-07-07 MED ORDER — FENTANYL CITRATE (PF) 100 MCG/2ML IJ SOLN
INTRAMUSCULAR | Status: DC | PRN
  Administered 2014-07-07 (×4): 50 ug via INTRAVENOUS

## 2014-07-07 MED ORDER — FENTANYL CITRATE (PF) 100 MCG/2ML IJ SOLN
INTRAMUSCULAR | Status: AC
Start: 2014-07-07 — End: 2014-07-07
  Filled 2014-07-07: qty 2

## 2014-07-07 MED ORDER — IOHEXOL 300 MG/ML  SOLN
INTRAMUSCULAR | Status: DC | PRN
  Administered 2014-07-07: 25 mL via INTRAVENOUS

## 2014-07-07 MED ORDER — CEFAZOLIN SODIUM 1-5 GM-% IV SOLN
INTRAVENOUS | Status: AC
  Filled 2014-07-07: qty 50

## 2014-07-07 MED ORDER — MIDAZOLAM HCL 2 MG/2ML IJ SOLN
INTRAMUSCULAR | Status: AC
  Filled 2014-07-07: qty 2

## 2014-07-07 MED ORDER — HYDROMORPHONE HCL 1 MG/ML IJ SOLN: 1.0000 mg | Freq: Once | INTRAMUSCULAR | Status: DC | PRN

## 2014-07-07 MED ORDER — HEPARIN SODIUM (PORCINE) 1000 UNIT/ML IJ SOLN
INTRAMUSCULAR | Status: DC | PRN
  Administered 2014-07-07: 3000 [IU] via INTRAVENOUS

## 2014-07-07 MED ORDER — LIDOCAINE-EPINEPHRINE (PF) 1 %-1:200000 IJ SOLN
INTRAMUSCULAR | Status: AC
  Filled 2014-07-07: qty 30

## 2014-07-07 MED ORDER — HEPARIN SODIUM (PORCINE) 1000 UNIT/ML IJ SOLN
INTRAMUSCULAR | Status: AC
  Filled 2014-07-07: qty 1

## 2014-07-07 MED ORDER — MIDAZOLAM HCL 5 MG/5ML IJ SOLN
INTRAMUSCULAR | Status: AC
  Filled 2014-07-07: qty 5

## 2014-07-07 MED ORDER — LIDOCAINE-EPINEPHRINE (PF) 1 %-1:200000 IJ SOLN
INTRAMUSCULAR | Status: DC | PRN
  Administered 2014-07-07: 10 mL via INTRADERMAL

## 2014-07-07 MED ORDER — CEFAZOLIN SODIUM 1-5 GM-% IV SOLN
1.0000 g | Freq: Once | INTRAVENOUS | Status: AC
  Administered 2014-07-07: 1 g via INTRAVENOUS

## 2014-07-07 MED ORDER — SODIUM CHLORIDE 0.9 % IV SOLN
INTRAVENOUS | Status: DC
  Administered 2014-07-07: 09:00:00 via INTRAVENOUS

## 2014-07-07 SURGICAL SUPPLY — 8 items
BALLN LUTONIX DCB 6X40X130 (BALLOONS) ×3
BALLOON LUTONIX DCB 6X40X130 (BALLOONS) ×1 IMPLANT
DRAPE BRACHIAL (DRAPES) ×3 IMPLANT
KIT 5FR STIFF NT/TG (MISCELLANEOUS) ×3 IMPLANT
PACK ANGIOGRAPHY (CUSTOM PROCEDURE TRAY) ×3 IMPLANT
SHEATH BRITE TIP 6FRX5.5 (SHEATH) ×3 IMPLANT
TOWEL OR 17X26 4PK STRL BLUE (TOWEL DISPOSABLE) ×3 IMPLANT
WIRE MAGIC TOR.035 180C (WIRE) ×3 IMPLANT

## 2014-07-07 NOTE — H&P (Signed)
[facility] Admission History & Physical  MRN : [id number]  [name] is a 29 y.o. (Feb 11, 1985) female who presents with chief complaint of No chief complaint on file. [name] Kitchen  History of Present Illness: Patient presents with poorly functioning dialysis access. Her flow rates are down. Noninvasive study demonstrates stenosis within AV fistula. No other complaints.  Current Facility-Administered Medications  Medication Dose Route Frequency Provider Last Rate Last Dose  . 0.9 %  sodium chloride infusion   Intravenous Continuous Algernon Huxley, MD      . ceFAZolin (ANCEF) IVPB 1 g/50 mL premix  1 g Intravenous Once Algernon Huxley, MD      . HYDROmorphone (DILAUDID) injection 1 mg  1 mg Intravenous Once PRN Algernon Huxley, MD        Past Medical History  Diagnosis Date  . Diabetes mellitus   . Hypertension   . Renal insufficiency   . Morbid obesity   . Asthma   . Irregular heart beat     Past Surgical History  Procedure Laterality Date  . Eye surgery      laser photocoagulation  . Av fistula placement  08/26/10    Left Radiocephalic AVF    Social History History  Substance Use Topics  . Smoking status: Current Every Day Smoker -- 0.50 packs/day for 10 years    Types: Cigarettes  . Smokeless tobacco: Not on file  . Alcohol Use: No   lives at home.  Family History No bleeding disorders, clotting disorders, or porphyria's.  Allergies  Allergen Reactions  . Vancomycin Itching  . Latex Swelling and Rash     REVIEW OF SYSTEMS (Negative unless checked)  Constitutional: [] Weight loss  [] Fever  [] Chills Cardiac: [] Chest pain   [] Chest pressure   [] Palpitations   [] Shortness of breath when laying flat   [] Shortness of breath at rest   [] Shortness of breath with exertion. Vascular:  [] Pain in legs with walking   [] Pain in legs at rest   [] Pain in legs when laying flat   [] Claudication   [] Pain in feet when walking  [] Pain in feet at rest  [] Pain in feet  when laying flat   [] History of DVT   [] Phlebitis   [] Swelling in legs   [] Varicose veins   [] Non-healing ulcers Pulmonary:   [] Uses home oxygen   [] Productive cough   [] Hemoptysis   [] Wheeze  [] COPD   [] Asthma Neurologic:  [] Dizziness  [] Blackouts   [] Seizures   [] History of stroke   [] History of TIA  [] Aphasia   [] Temporary blindness   [] Dysphagia   [] Weakness or numbness in arms   [] Weakness or numbness in legs Musculoskeletal:  [] Arthritis   [] Joint swelling   [] Joint pain   [] Low back pain Hematologic:  [] Easy bruising  [] Easy bleeding   [] Hypercoagulable state   [] Anemic  [] Hepatitis Gastrointestinal:  [] Blood in stool   [] Vomiting blood  [] Gastroesophageal reflux/heartburn   [] Difficulty swallowing. Genitourinary:  [] Chronic kidney disease   [] Difficult urination  [] Frequent urination  [] Burning with urination   [] Blood in urine Skin:  [] Rashes   [] Ulcers   [] Wounds Psychological:  [] History of anxiety   []  History of major depression.  Physical Examination  Filed Vitals:   07/07/14 0712  BP: 158/107  Temp: 98 F (36.7 C)  TempSrc: Oral  Height: 5\' 9"  (1.753 m)  Weight: 246 lb (111.585 kg)  SpO2: 100%   Body mass index is 36.31 kg/(m^2).  Head: [place]/AT, No  temporalis wasting. Prominent temp pulse not noted. Ear/Nose/Throat: Hearing grossly intact, nares w/o erythema or drainage, oropharynx w/o Erythema/Exudate,  Eyes: PERRLA, EOMI.  Neck: Supple, no nuchal rigidity.  No bruit or JVD.  Pulmonary:  Good air movement, clear to auscultation bilaterally, no use of accessory muscles.  Cardiac: RRR, normal S1, S2, no Murmurs, rubs or gallops. Vascular: Right arm AV fistula with good thrill and bruit Vessel Right Left  Radial Palpable Palpable  Ulnar Palpable Palpable  Brachial Palpable Palpable  Carotid Palpable, without bruit Palpable, without bruit  Aorta Not palpable N/A  Femoral Palpable Palpable  Popliteal Palpable Palpable  PT Palpable Palpable  DP Palpable Palpable    Gastrointestinal: soft, non-tender/non-distended. No guarding/reflex.  Musculoskeletal: M/S 5/5 throughout.  Extremities without ischemic changes.  No deformity or atrophy.  Neurologic: CN 2-12 intact. Pain and light touch intact in extremities.  Symmetrical.  Speech is fluent. Motor exam as listed above. Psychiatric: Judgment intact, Mood & affect appropriate for pt's clinical situation. Dermatologic: No rashes or ulcers noted.  No cellulitis or open wounds. Lymph : No Cervical, Axillary, or Inguinal lymphadenopathy.     CBC Lab Results  Component Value Date   WBC 5.7 07/19/2013   HGB 7.1* 07/20/2013   HCT 21.8* 07/19/2013   MCV 87 07/19/2013   PLT 162 07/19/2013    BMET    Component Value Date/Time   NA 132* 02/03/2014 1007   NA 135 08/26/2010 0631   K 4.7 02/03/2014 1007   K 3.9 08/26/2010 0631   CL 93* 02/03/2014 1007   CL 98 08/26/2010 0631   CO2 25 02/03/2014 1007   CO2 28 08/26/2010 0631   GLUCOSE 258* 02/03/2014 1007   GLUCOSE 101* 08/26/2010 0631   BUN 58* 02/03/2014 1007   BUN 16 08/26/2010 0631   CREATININE 11.14* 02/03/2014 1007   CREATININE 7.21* 08/26/2010 0631   CALCIUM 8.4* 02/03/2014 1007   CALCIUM 8.8 08/26/2010 0631   CALCIUM 8.1* 08/23/2010 1000   GFRNONAA 5* 07/19/2013 0416   GFRNONAA 7* 08/26/2010 0631   GFRAA 6* 07/19/2013 0416   GFRAA 8* 08/26/2010 0631   CrCl cannot be calculated (Patient has no serum creatinine result on file.).  COAG Lab Results  Component Value Date   INR 1.1 04/08/2013   INR 0.9 03/23/2011   INR 1.22 08/20/2010      Assessment/Plan 1. End-stage renal disease: Has been on dialysis for many years. This is her second fistula that we have taken care of. 2. Complication of dialysis access: Diminished flow rates and noninvasive study showing stenosis. Plan fistulogram for evaluation and treatment. 3.  Hypertension: Stable   DEW,JASON, MD  07/07/2014 8:04 AM

## 2014-07-07 NOTE — Discharge Instructions (Signed)
Fistulogram, Care After °Refer to this sheet in the next few weeks. These instructions provide you with information on caring for yourself after your procedure. Your health care provider may also give you more specific instructions. Your treatment has been planned according to current medical practices, but problems sometimes occur. Call your health care provider if you have any problems or questions after your procedure. °WHAT TO EXPECT AFTER THE PROCEDURE °After your procedure, it is typical to have the following: °· A small amount of discomfort in the area where the catheters were placed. °· A small amount of bruising around the fistula. °· Sleepiness and fatigue. °HOME CARE INSTRUCTIONS °· Rest at home for the day following your procedure. °· Do not drive or operate heavy machinery while taking pain medicine. °· Take medicines only as directed by your health care provider. °· Do not take baths, swim, or use a hot tub until your health care provider approves. You may shower 24 hours after the procedure or as directed by your health care provider. °· There are many different ways to close and cover an incision, including stitches, skin glue, and adhesive strips. Follow your health care provider's instructions on: °¨ Incision care. °¨ Bandage (dressing) changes and removal. °¨ Incision closure removal. °· Monitor your dialysis fistula carefully. °SEEK MEDICAL CARE IF: °· You have drainage, redness, swelling, or pain at your catheter site. °· You have a fever. °· You have chills. °SEEK IMMEDIATE MEDICAL CARE IF: °· You feel weak. °· You have trouble balancing. °· You have trouble moving your arms or legs. °· You have problems with your speech or vision. °· You can no longer feel a vibration or buzz when you put your fingers over your dialysis fistula. °· The limb that was used for the procedure: °¨ Swells. °¨ Is painful. °¨ Is cold. °¨ Is discolored, such as blue or pale white. °Document Released: 06/03/2013  Document Reviewed: 03/08/2013 °ExitCare® Patient Information ©2015 [employer]. This information is not intended to replace advice given to you by your health care provider. Make sure you discuss any questions you have with your health care provider. ° °

## 2014-07-07 NOTE — CV Procedure (Signed)
[facility]    OPERATIVE NOTE   PROCEDURE: 1.   Right brachiocephalic arteriovenous fistula cannulation under ultrasound guidance 2.   Right arm fistulagram including central venogram 3.   Percutaneous transluminal angioplasty of perianastomotic stenosis with 6 mm diameter by 4 cm length Lutonix drug-coated angioplasty balloon  PRE-OPERATIVE DIAGNOSIS: 1. ESRD 2. Poorly functional right brachiocephalic AVF  POST-OPERATIVE DIAGNOSIS: same as above   SURGEON: Leotis Pain, MD  ANESTHESIA: local with MCS  ESTIMATED BLOOD LOSS: Minimal  FINDING(S): 1. 75-80% stenosis in the cephalic vein just beyond the anastomosis the brachial artery  SPECIMEN(S):  None  CONTRAST: 25 cc  INDICATIONS: [name] is a 29 y.o. female who presents with malfunctioning  right brachiocephalic arteriovenous fistula.  The patient is scheduled for  right arm fistulagram.  The patient is aware the risks include but are not limited to: bleeding, infection, thrombosis of the cannulated access, and possible anaphylactic reaction to the contrast.  The patient is aware of the risks of the procedure and elects to proceed forward.  DESCRIPTION: After full informed written consent was obtained, the patient was brought back to the angiography suite and placed supine upon the angiography table.  The patient was connected to monitoring equipment.  The  right arm was prepped and draped in the standard fashion for a percutaneous access intervention.  Under ultrasound guidance, the  right brachiocephalic arteriovenous fistula was cannulated with a micropuncture needle under direct ultrasound guidance in an antegrade fashion near the arterial access site and a permanent image was performed.  The microwire was advanced into the fistula and the needle was exchanged for the a microsheath.  I then upsized to a 6 Fr Sheath and imaging was performed.  Hand injections were completed to image the access  including the central venous system. Pressure was held on the fistula to evaluate the arterial anastomosis. This demonstrated a 75-80% stenosis in the cephalic vein just beyond the brachiocephalic anastomosis. The fistula was otherwise patent with no other significant stenosis identified.  Based on the images, this patient will need intervention for her perianastomotic stenosis. I removed the antegrade sheath and placed a Monocryl pursestring suture. I then used the ultrasound to access the fistula in a retrograde fashion near the venous access site. The fistula was imaged at this location and found to be patent, and accessed under direct ultrasound guidance without difficulty with a micropuncture needle and a permanent image was recorded. A micropuncture wire and sheath were then placed, and we then upsized to the 6 French sheath. I then gave the patient 3000 units of intravenous heparin.  I then crossed the stenosis with a Magic Tourqe wire.  Based on the imaging, a 6 mm x 4 cm  Lutonix drug-coated angioplasty balloon was selected.  The balloon was centered around the perianastomotic stenosis with the distal end of the balloon going just into the brachial artery and inflated to 14 ATM for 1 minute(s).  On completion imaging, a 10 % residual stenosis was present.     Based on the completion imaging, no further intervention is necessary.  The wire and balloon were removed from the sheath.  A 4-0 Monocryl purse-string suture was sewn around the sheath.  The sheath was removed while tying down the suture.  A sterile bandage was applied to the puncture site.  COMPLICATIONS: None  CONDITION: Stable   [name]  07/07/2014 8:47 AM

## 2014-07-08 ENCOUNTER — Encounter: Payer: Self-pay | Admitting: Vascular Surgery

## 2014-11-01 ENCOUNTER — Emergency Department
Admission: EM | Admit: 2014-11-01 | Discharge: 2014-11-01 | Disposition: A | Discharge status: Home / Self Care | Payer: Medicare Other | Attending: Emergency Medicine | Admitting: Emergency Medicine

## 2014-11-01 ENCOUNTER — Encounter: Payer: Self-pay | Admitting: Emergency Medicine

## 2014-11-01 DIAGNOSIS — Z79899 Other long term (current) drug therapy: Secondary | ICD-10-CM | POA: Insufficient documentation

## 2014-11-01 DIAGNOSIS — E119 Type 2 diabetes mellitus without complications: Secondary | ICD-10-CM | POA: Insufficient documentation

## 2014-11-01 DIAGNOSIS — M545 Low back pain, unspecified: Secondary | ICD-10-CM

## 2014-11-01 DIAGNOSIS — Z7982 Long term (current) use of aspirin: Secondary | ICD-10-CM | POA: Diagnosis not present

## 2014-11-01 DIAGNOSIS — Z91040 Latex allergy status: Secondary | ICD-10-CM | POA: Diagnosis not present

## 2014-11-01 DIAGNOSIS — Z7901 Long term (current) use of anticoagulants: Secondary | ICD-10-CM | POA: Insufficient documentation

## 2014-11-01 DIAGNOSIS — Z720 Tobacco use: Secondary | ICD-10-CM | POA: Diagnosis not present

## 2014-11-01 DIAGNOSIS — I10 Essential (primary) hypertension: Secondary | ICD-10-CM | POA: Insufficient documentation

## 2014-11-01 DIAGNOSIS — Z794 Long term (current) use of insulin: Secondary | ICD-10-CM | POA: Insufficient documentation

## 2014-11-01 LAB — URINALYSIS COMPLETE WITH MICROSCOPIC (ARMC ONLY)
BILIRUBIN URINE: NEGATIVE
Bacteria, UA: NONE SEEN
HGB URINE DIPSTICK: NEGATIVE
KETONES UR: NEGATIVE mg/dL
Leukocytes, UA: NEGATIVE
NITRITE: NEGATIVE
Protein, ur: 100 mg/dL — AB
SPECIFIC GRAVITY, URINE: 1.008 (ref 1.005–1.030)
pH: 9 — ABNORMAL HIGH (ref 5.0–8.0)

## 2014-11-01 MED ORDER — OXYCODONE-ACETAMINOPHEN 5-325 MG PO TABS
1.0000 | ORAL_TABLET | Freq: Once | ORAL | Status: AC
  Administered 2014-11-01: 1 via ORAL
  Filled 2014-11-01: qty 1

## 2014-11-01 MED ORDER — DIAZEPAM 2 MG PO TABS: 2.0000 mg | ORAL_TABLET | Freq: Three times a day (TID) | ORAL | Status: DC | PRN

## 2014-11-01 MED ORDER — OXYCODONE-ACETAMINOPHEN 5-325 MG PO TABS: 1.0000 | ORAL_TABLET | ORAL | Status: DC | PRN

## 2014-11-01 NOTE — ED Notes (Signed)
Pt to ed with c/o right side lower back pain since last night.  Pt states she drove to [place] and back yesterday without getting out of the car.  Pt reports last night when she got home she noticed the pain.

## 2014-11-01 NOTE — Discharge Instructions (Signed)
Follow-up with your doctor if any continued problems. Continue taking her regular medication as prescribed. Take Percocet and diazepam as directed. Use warm moist heat or ice to back muscle as needed.

## 2014-11-01 NOTE — ED Notes (Signed)
Pt awakened from sleep with back pain. Pt only able to complete 2 hours of a scheduled 4 hours of dialysis this morning due to pain.

## 2014-11-01 NOTE — ED Provider Notes (Signed)
[facility] Emergency Department Provider Note  ____________________________________________  Time seen: Approximately 9:35 AM  I have reviewed the triage vital signs and the nursing notes.   HISTORY  Chief Complaint Back Pain   HPI [name] is a 29 y.o. female is here with complaint of right lower back pain since last evening. Patient states that she rode to [facility] yesterday and back without getting out of car. She states that she has had problems with her back in the past and is unaware of any recent injury to her back. She states that at approximately 2 or 3:00 this morning she woke with pain. She went to dialysis this morning but was unable to stay for the complete 4 hours. After 2 hours she could not sit still without severe pain. She was told to come to the emergency room. She denies any urinary symptoms or paresthesias. There is no incontinence of bowel or bladder. Patient does have a history of urinary tract infections and kidney stones. Patient rates her pain as a 10 out of 10 and is nonradiating. Patient was able to drive from the dialysis center to the emergency room but has her parents meeting her here.   Past Medical History  Diagnosis Date  . Diabetes mellitus   . Hypertension   . Renal insufficiency   . Morbid obesity ([place])   . Asthma   . Irregular heart beat     Patient Active Problem List   Diagnosis Date Noted  . Complication from renal dialysis device 07/07/2014  . End stage renal disease 12/29/2010    Past Surgical History  Procedure Laterality Date  . Eye surgery      laser photocoagulation  . Av fistula placement  08/26/10    Left Radiocephalic AVF  . Peripheral vascular catheterization N/A 07/07/2014    Procedure: A/V Shuntogram/Fistulagram;  Surgeon: Algernon Huxley, MD;  Location: [facility];  Service: Cardiovascular;  Laterality: N/A;  . Peripheral vascular catheterization N/A 07/07/2014    Procedure: A/V Shunt  Intervention;  Surgeon: Algernon Huxley, MD;  Location: [facility];  Service: Cardiovascular;  Laterality: N/A;    Current Outpatient Rx  Name  Route  Sig  Dispense  Refill  . amLODipine (NORVASC) 10 MG tablet   Oral   Take 10 mg by mouth at bedtime.           [name] Kitchen aspirin 81 MG tablet   Oral   Take 81 mg by mouth daily.           [name] Kitchen b complex-vitamin c-folic acid (NEPHRO-VITE) 0.8 MG TABS   Oral   Take 0.8 mg by mouth at bedtime.           . busPIRone (BUSPAR) 10 MG tablet   Oral   Take 10 mg by mouth 2 (two) times daily.         . calcium acetate, Phos Binder, (PHOSLYRA) 667 MG/5ML SOLN   Oral   Take by mouth 3 (three) times daily with meals.         . darbepoetin (ARANESP, ALB FREE, SURECLICK) [id number] SOLN   Subcutaneous   Inject 200 mcg into the skin every 7 (seven) days. Given every Wed. @@ HD          . desvenlafaxine (PRISTIQ) 50 MG 24 hr tablet   Oral   Take 50 mg by mouth daily.         . diazepam (VALIUM) 2  MG tablet   Oral   Take 1 tablet (2 mg total) by mouth every 8 (eight) hours as needed for muscle spasms.   9 tablet   0   . enoxaparin (LOVENOX) 30 MG/0.3ML SOLN   Subcutaneous   Inject 30 mg into the skin daily.           [name] Kitchen gabapentin (NEURONTIN) 100 MG capsule   Oral   Take 100 mg by mouth once.         . insulin aspart (NOVOLOG) 100 UNIT/ML injection   Subcutaneous   Inject 2 Units into the skin 3 (three) times daily before meals.           . insulin NPH-insulin regular (NOVOLIN 70/30) (70-30) 100 UNIT/ML injection   Subcutaneous   Inject 10 Units into the skin 2 (two) times daily.           [name] Kitchen labetalol (NORMODYNE) 200 MG tablet   Oral   Take 200 mg by mouth 2 (two) times daily.           [name] Kitchen levETIRAcetam (KEPPRA) 750 MG tablet   Oral   Take 750 mg by mouth 2 (two) times daily.         . metoprolol succinate (TOPROL-XL) 100 MG 24 hr tablet   Oral   Take 100 mg by mouth daily. Take with or immediately  following a meal.         . metoprolol succinate (TOPROL-XL) 25 MG 24 hr tablet   Oral   Take 25 mg by mouth daily.         [name] Kitchen omeprazole (PRILOSEC) 20 MG capsule   Oral   Take 20 mg by mouth daily.         [name] Kitchen oxyCODONE-acetaminophen (PERCOCET) 5-325 MG tablet   Oral   Take 1 tablet by mouth every 4 (four) hours as needed for severe pain.   20 tablet   0   . pravastatin (PRAVACHOL) 40 MG tablet   Oral   Take 40 mg by mouth daily.         . promethazine (PHENERGAN) 12.5 MG tablet   Oral   Take 12.5 mg by mouth every 6 (six) hours as needed for nausea or vomiting.         . sevelamer (RENAGEL) 800 MG tablet   Oral   Take 2,400 mg by mouth 3 (three) times daily with meals.            Allergies Vancomycin and Latex  History reviewed. No pertinent family history.  Social History Social History  Substance Use Topics  . Smoking status: Current Every Day Smoker -- 0.50 packs/day for 10 years    Types: Cigarettes  . Smokeless tobacco: None  . Alcohol Use: No    Review of Systems Constitutional: No fever/chills ENT: No sore throat. Cardiovascular: Denies chest pain. Respiratory: Denies shortness of breath. Gastrointestinal: No abdominal pain.  No nausea, no vomiting.   Genitourinary: Negative for dysuria. Musculoskeletal: Positive for right lower back pain. Skin: Negative for rash. Neurological: Negative for headaches, focal weakness or numbness.  10-point ROS otherwise negative.  ____________________________________________   PHYSICAL EXAM:  VITAL SIGNS: ED Triage Vitals  Enc Vitals Group     BP --      Pulse --      Resp --      Temp --      Temp src --      SpO2 --  Weight --      Height --      Head Cir --      Peak Flow --      Pain Score 11/01/14 0913 10     Pain Loc --      Pain Edu? --      Excl. in [place]? --     Constitutional: Alert and oriented. Well appearing and in no acute distress. Eyes: Conjunctivae are normal. PERRL.  EOMI. Head: Atraumatic. Nose: No congestion/rhinnorhea. Neck: No stridor.   Cardiovascular: Normal rate, regular rhythm. Grossly normal heart sounds.  Good peripheral circulation. Respiratory: Normal respiratory effort.  No retractions. Lungs CTAB. Gastrointestinal: Soft and nontender. No distention. No CVA tenderness. Musculoskeletal: There is moderate tenderness on palpation of the right SI joint area and L5-S1 right vertebral muscle area. Range of motion is slow secondary to patient's discomfort. Patient is ambulatory in the emergency room. No lower extremity tenderness nor edema.  No joint effusions. Neurologic:  Normal speech and language. No gross focal neurologic deficits are appreciated. No gait instability. Skin:  Skin is warm, dry and intact. No rash noted. Psychiatric: Mood and affect are normal. Speech and behavior are normal.  ____________________________________________   LABS (all labs ordered are listed, but only abnormal results are displayed)  Labs Reviewed  URINALYSIS COMPLETEWITH MICROSCOPIC ([place] ONLY) - Abnormal; Notable for the following:    Color, Urine STRAW (*)    APPearance CLEAR (*)    Glucose, UA >500 (*)    pH 9.0 (*)    Protein, ur 100 (*)    Squamous Epithelial / LPF 0-5 (*)    All other components within normal limits    PROCEDURES  Procedure(s) performed: None  Critical Care performed: No  ____________________________________________   INITIAL IMPRESSION / ASSESSMENT AND PLAN / ED COURSE  Pertinent labs & imaging results that were available during my care of the patient were reviewed by me and considered in my medical decision making (see chart for details).  Patient is aware that her urine did not show any signs of infection or kidney stone. She is encouraged to finish her dialysis treatment. Patient was given Percocet while in the emergency room and states that her pain has decreased since administration. She was given a prescription on  discharge for Percocet 5/325 when necessary pain every 4 hours. She is follow-up with her primary care physician if any continued problems. ____________________________________________   FINAL CLINICAL IMPRESSION(S) / ED DIAGNOSES  Final diagnoses:  Right-sided low back pain without sciatica      Johnn Hai, PA-C 11/01/14 [place], MD 11/01/14 1539

## 2014-12-05 ENCOUNTER — Encounter: Payer: Self-pay | Admitting: *Deleted

## 2014-12-05 ENCOUNTER — Emergency Department
Admission: EM | Admit: 2014-12-05 | Discharge: 2014-12-05 | Disposition: A | Discharge status: Home / Self Care | Payer: No Typology Code available for payment source | Attending: Emergency Medicine | Admitting: Emergency Medicine

## 2014-12-05 DIAGNOSIS — Y998 Other external cause status: Secondary | ICD-10-CM | POA: Diagnosis not present

## 2014-12-05 DIAGNOSIS — I120 Hypertensive chronic kidney disease with stage 5 chronic kidney disease or end stage renal disease: Secondary | ICD-10-CM | POA: Diagnosis not present

## 2014-12-05 DIAGNOSIS — Y9389 Activity, other specified: Secondary | ICD-10-CM | POA: Insufficient documentation

## 2014-12-05 DIAGNOSIS — Z79899 Other long term (current) drug therapy: Secondary | ICD-10-CM | POA: Diagnosis not present

## 2014-12-05 DIAGNOSIS — E119 Type 2 diabetes mellitus without complications: Secondary | ICD-10-CM | POA: Diagnosis not present

## 2014-12-05 DIAGNOSIS — N186 End stage renal disease: Secondary | ICD-10-CM | POA: Diagnosis not present

## 2014-12-05 DIAGNOSIS — Z794 Long term (current) use of insulin: Secondary | ICD-10-CM | POA: Insufficient documentation

## 2014-12-05 DIAGNOSIS — Z791 Long term (current) use of non-steroidal anti-inflammatories (NSAID): Secondary | ICD-10-CM | POA: Diagnosis not present

## 2014-12-05 DIAGNOSIS — Y92410 Unspecified street and highway as the place of occurrence of the external cause: Secondary | ICD-10-CM | POA: Insufficient documentation

## 2014-12-05 DIAGNOSIS — M5416 Radiculopathy, lumbar region: Secondary | ICD-10-CM

## 2014-12-05 DIAGNOSIS — Z992 Dependence on renal dialysis: Secondary | ICD-10-CM | POA: Diagnosis not present

## 2014-12-05 DIAGNOSIS — Z7901 Long term (current) use of anticoagulants: Secondary | ICD-10-CM | POA: Insufficient documentation

## 2014-12-05 DIAGNOSIS — Z720 Tobacco use: Secondary | ICD-10-CM | POA: Insufficient documentation

## 2014-12-05 DIAGNOSIS — Z7982 Long term (current) use of aspirin: Secondary | ICD-10-CM | POA: Insufficient documentation

## 2014-12-05 DIAGNOSIS — S39012A Strain of muscle, fascia and tendon of lower back, initial encounter: Secondary | ICD-10-CM | POA: Diagnosis not present

## 2014-12-05 DIAGNOSIS — Z91040 Latex allergy status: Secondary | ICD-10-CM | POA: Insufficient documentation

## 2014-12-05 DIAGNOSIS — S3992XA Unspecified injury of lower back, initial encounter: Secondary | ICD-10-CM | POA: Diagnosis present

## 2014-12-05 MED ORDER — MELOXICAM 15 MG PO TABS: 15.0000 mg | ORAL_TABLET | Freq: Every day | ORAL | Status: DC

## 2014-12-05 MED ORDER — PREDNISONE 10 MG PO TABS: 10.0000 mg | ORAL_TABLET | ORAL | Status: DC

## 2014-12-05 MED ORDER — CYCLOBENZAPRINE HCL 10 MG PO TABS: 10.0000 mg | ORAL_TABLET | Freq: Three times a day (TID) | ORAL | Status: DC | PRN
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
## 2014-12-05 NOTE — Discharge Instructions (Signed)
Back Exercises The following exercises strengthen the muscles that help to support the back. They also help to keep the lower back flexible. Doing these exercises can help to prevent back pain or lessen existing pain. If you have back pain or discomfort, try doing these exercises 2-3 times each day or as told by your health care provider. When the pain goes away, do them once each day, but increase the number of times that you repeat the steps for each exercise (do more repetitions). If you do not have back pain or discomfort, do these exercises once each day or as told by your health care provider. EXERCISES Single Knee to Chest Repeat these steps 3-5 times for each leg:  Lie on your back on a firm bed or the floor with your legs extended.  Bring one knee to your chest. Your other leg should stay extended and in contact with the floor.  Hold your knee in place by grabbing your knee or thigh.  Pull on your knee until you feel a gentle stretch in your lower back.  Hold the stretch for 10-30 seconds.  Slowly release and straighten your leg. Pelvic Tilt Repeat these steps 5-10 times:  Lie on your back on a firm bed or the floor with your legs extended.  Bend your knees so they are pointing toward the ceiling and your feet are flat on the floor.  Tighten your lower abdominal muscles to press your lower back against the floor. This motion will tilt your pelvis so your tailbone points up toward the ceiling instead of pointing to your feet or the floor.  With gentle tension and even breathing, hold this position for 5-10 seconds. Cat-Cow Repeat these steps until your lower back becomes more flexible:  Get into a hands-and-knees position on a firm surface. Keep your hands under your shoulders, and keep your knees under your hips. You may place padding under your knees for comfort.  Let your head hang down, and point your tailbone toward the floor so your lower back becomes rounded like the  back of a cat.  Hold this position for 5 seconds.  Slowly lift your head and point your tailbone up toward the ceiling so your back forms a sagging arch like the back of a cow.  Hold this position for 5 seconds. Press-Ups Repeat these steps 5-10 times:  Lie on your abdomen (face-down) on the floor.  Place your palms near your head, about shoulder-width apart.  While you keep your back as relaxed as possible and keep your hips on the floor, slowly straighten your arms to raise the top half of your body and lift your shoulders. Do not use your back muscles to raise your upper torso. You may adjust the placement of your hands to make yourself more comfortable.  Hold this position for 5 seconds while you keep your back relaxed.  Slowly return to lying flat on the floor. Bridges Repeat these steps 10 times:  Lie on your back on a firm surface.  Bend your knees so they are pointing toward the ceiling and your feet are flat on the floor.  Tighten your buttocks muscles and lift your buttocks off of the floor until your waist is at almost the same height as your knees. You should feel the muscles working in your buttocks and the back of your thighs. If you do not feel these muscles, slide your feet 1-2 inches farther away from your buttocks.  Hold this position for 3-5  seconds.  Slowly lower your hips to the starting position, and allow your buttocks muscles to relax completely. If this exercise is too easy, try doing it with your arms crossed over your chest. Abdominal Crunches Repeat these steps 5-10 times:  Lie on your back on a firm bed or the floor with your legs extended.  Bend your knees so they are pointing toward the ceiling and your feet are flat on the floor.  Cross your arms over your chest.  Tip your chin slightly toward your chest without bending your neck.  Tighten your abdominal muscles and slowly raise your trunk (torso) high enough to lift your shoulder blades a  tiny bit off of the floor. Avoid raising your torso higher than that, because it can put too much stress on your low back and it does not help to strengthen your abdominal muscles.  Slowly return to your starting position. Back Lifts Repeat these steps 5-10 times:  Lie on your abdomen (face-down) with your arms at your sides, and rest your forehead on the floor.  Tighten the muscles in your legs and your buttocks.  Slowly lift your chest off of the floor while you keep your hips pressed to the floor. Keep the back of your head in line with the curve in your back. Your eyes should be looking at the floor.  Hold this position for 3-5 seconds.  Slowly return to your starting position. SEEK MEDICAL CARE IF:  Your back pain or discomfort gets much worse when you do an exercise.  Your back pain or discomfort does not lessen within 2 hours after you exercise. If you have any of these problems, stop doing these exercises right away. Do not do them again unless your health care provider says that you can. SEEK IMMEDIATE MEDICAL CARE IF:  You develop sudden, severe back pain. If this happens, stop doing the exercises right away. Do not do them again unless your health care provider says that you can.   This information is not intended to replace advice given to you by your health care provider. Make sure you discuss any questions you have with your health care provider.   Document Released: 02/25/2004 Document Revised: 10/08/2014 Document Reviewed: 03/13/2014 Elsevier Interactive Patient Education 2016 [employer]  Lumbosacral Radiculopathy Lumbosacral radiculopathy is a condition that involves the spinal nerves and nerve roots in the low back and bottom of the spine. The condition develops when these nerves and nerve roots move out of place or become inflamed and cause symptoms. CAUSES This condition may be caused by:  Pressure from a disk that bulges out of place (herniated disk). A  disk is a plate of cartilage that separates bones in the spine.  Disk degeneration.  A narrowing of the bones of the lower back (spinal stenosis).  A tumor.  An infection.  An injury that places sudden pressure on the disks that cushion the bones of your lower spine. RISK FACTORS This condition is more likely to develop in:  Males aged 30-50 years.  Females aged 35-60 years.  People who lift improperly.  People who are overweight or live a sedentary lifestyle.  People who smoke.  People who perform repetitive activities that strain the spine. SYMPTOMS Symptoms of this condition include:  Pain that goes down from the back into the legs (sciatica). This is the most common symptom. The pain may be worse with sitting, coughing, or sneezing.  Pain and numbness in the arms and legs.  Muscle  weakness.  Tingling.  Loss of bladder control or bowel control. DIAGNOSIS This condition is diagnosed with a physical exam and medical history. If the pain is lasting, you may have tests, such as:  MRI scan.  X-ray.  CT scan.  Myelogram.  Nerve conduction study. TREATMENT This condition is often treated with:  Hot packs and ice applied to affected areas.  Stretches to improve flexibility.  Exercises to strengthen back muscles.  Physical therapy.  Pain medicine.  A steroid injection in the spine. In some cases, no treatment is needed. If the condition is long-lasting (chronic), or if symptoms are severe, treatment may involve surgery or lifestyle changes, such as following a weight loss plan. HOME CARE INSTRUCTIONS Medicines  Take medicines only as directed by your health care provider.  Do not drive or operate heavy machinery while taking pain medicine. Injury Care  Apply a heat pack to the injured area as directed by your health care provider.  Apply ice to the affected area:  Put ice in a plastic bag.  Place a towel between your skin and the bag.  Leave  the ice on for 20-30 minutes, every 2 hours while you are awake or as needed. Or, leave the ice on for as long as directed by your health care provider. Other Instructions  If you were shown how to do any exercises or stretches, do them as directed by your health care provider.  If your health care provider prescribed a diet or exercise program, follow it as directed.  Keep all follow-up visits as directed by your health care provider. This is important. SEEK MEDICAL CARE IF:  Your pain does not improve over time even when taking pain medicines. SEEK IMMEDIATE MEDICAL CARE IF:  Your develop severe pain.  Your pain suddenly gets worse.  You develop increasing weakness in your legs.  You lose the ability to control your bladder or bowel.  You have difficulty walking or balancing.  You have a fever.   This information is not intended to replace advice given to you by your health care provider. Make sure you discuss any questions you have with your health care provider.   Document Released: 01/17/2005 Document Revised: 06/03/2014 Document Reviewed: 01/13/2014 Elsevier Interactive Patient Education 2016 [place].  Muscle Strain A muscle strain is an injury that occurs when a muscle is stretched beyond its normal length. Usually a small number of muscle fibers are torn when this happens. Muscle strain is rated in degrees. First-degree strains have the least amount of muscle fiber tearing and pain. Second-degree and third-degree strains have increasingly more tearing and pain.  Usually, recovery from muscle strain takes 1-2 weeks. Complete healing takes 5-6 weeks.  CAUSES  Muscle strain happens when a sudden, violent force placed on a muscle stretches it too far. This may occur with lifting, sports, or a fall.  RISK FACTORS Muscle strain is especially common in athletes.  SIGNS AND SYMPTOMS At the site of the muscle strain, there may  be:  Pain.  Bruising.  Swelling.  Difficulty using the muscle due to pain or lack of normal function. DIAGNOSIS  Your health care provider will perform a physical exam and ask about your medical history. TREATMENT  Often, the best treatment for a muscle strain is resting, icing, and applying cold compresses to the injured area.  HOME CARE INSTRUCTIONS   Use the PRICE method of treatment to promote muscle healing during the first 2-3 days after your injury. The PRICE  method involves:  Protecting the muscle from being injured again.  Restricting your activity and resting the injured body part.  Icing your injury. To do this, put ice in a plastic bag. Place a towel between your skin and the bag. Then, apply the ice and leave it on from 15-20 minutes each hour. After the third day, switch to moist heat packs.  Apply compression to the injured area with a splint or elastic bandage. Be careful not to wrap it too tightly. This may interfere with blood circulation or increase swelling.  Elevate the injured body part above the level of your heart as often as you can.  Only take over-the-counter or prescription medicines for pain, discomfort, or fever as directed by your health care provider.  Warming up prior to exercise helps to prevent future muscle strains. SEEK MEDICAL CARE IF:   You have increasing pain or swelling in the injured area.  You have numbness, tingling, or a significant loss of strength in the injured area. MAKE SURE YOU:   Understand these instructions.  Will watch your condition.  Will get help right away if you are not doing well or get worse.   This information is not intended to replace advice given to you by your health care provider. Make sure you discuss any questions you have with your health care provider.   Document Released: 01/17/2005 Document Revised: 11/07/2012 Document Reviewed: 08/16/2012 Elsevier Interactive Patient Education [employer].

## 2014-12-10 DIAGNOSIS — F329 Major depressive disorder, single episode, unspecified: Secondary | ICD-10-CM | POA: Insufficient documentation

## 2015-01-07 ENCOUNTER — Other Ambulatory Visit: Payer: Self-pay | Admitting: Vascular Surgery

## 2015-01-12 ENCOUNTER — Ambulatory Visit
Admission: RE | Admit: 2015-01-12 | Discharge: 2015-01-12 | Disposition: A | Discharge status: Home / Self Care | Payer: Medicare Other | Source: Ambulatory Visit | Attending: Vascular Surgery | Admitting: Vascular Surgery

## 2015-01-12 ENCOUNTER — Encounter: Payer: Self-pay | Admitting: *Deleted

## 2015-01-12 ENCOUNTER — Encounter
Admission: RE | Disposition: A | Discharge status: Home / Self Care | Payer: Self-pay | Source: Ambulatory Visit | Attending: Vascular Surgery

## 2015-01-12 DIAGNOSIS — Y832 Surgical operation with anastomosis, bypass or graft as the cause of abnormal reaction of the patient, or of later complication, without mention of misadventure at the time of the procedure: Secondary | ICD-10-CM | POA: Diagnosis not present

## 2015-01-12 DIAGNOSIS — I120 Hypertensive chronic kidney disease with stage 5 chronic kidney disease or end stage renal disease: Secondary | ICD-10-CM | POA: Diagnosis not present

## 2015-01-12 DIAGNOSIS — J45909 Unspecified asthma, uncomplicated: Secondary | ICD-10-CM | POA: Insufficient documentation

## 2015-01-12 DIAGNOSIS — Z992 Dependence on renal dialysis: Secondary | ICD-10-CM | POA: Diagnosis not present

## 2015-01-12 DIAGNOSIS — Z6841 Body Mass Index (BMI) 40.0 and over, adult: Secondary | ICD-10-CM | POA: Insufficient documentation

## 2015-01-12 DIAGNOSIS — F17200 Nicotine dependence, unspecified, uncomplicated: Secondary | ICD-10-CM | POA: Insufficient documentation

## 2015-01-12 DIAGNOSIS — E1122 Type 2 diabetes mellitus with diabetic chronic kidney disease: Secondary | ICD-10-CM | POA: Insufficient documentation

## 2015-01-12 DIAGNOSIS — I499 Cardiac arrhythmia, unspecified: Secondary | ICD-10-CM | POA: Insufficient documentation

## 2015-01-12 DIAGNOSIS — T82858A Stenosis of vascular prosthetic devices, implants and grafts, initial encounter: Secondary | ICD-10-CM | POA: Diagnosis present

## 2015-01-12 DIAGNOSIS — N186 End stage renal disease: Secondary | ICD-10-CM | POA: Insufficient documentation

## 2015-01-12 HISTORY — PX: PERIPHERAL VASCULAR CATHETERIZATION: SHX172C

## 2015-01-12 LAB — POTASSIUM (ARMC VASCULAR LAB ONLY): POTASSIUM (ARMC VASCULAR LAB): 4.6 (ref 3.5–5.1)

## 2015-01-12 LAB — GLUCOSE, CAPILLARY: Glucose-Capillary: 361 mg/dL — ABNORMAL HIGH (ref 65–99)

## 2015-01-12 SURGERY — A/V SHUNTOGRAM/FISTULAGRAM
Anesthesia: Moderate Sedation | Laterality: Right

## 2015-01-12 MED ORDER — MIDAZOLAM HCL 5 MG/5ML IJ SOLN
INTRAMUSCULAR | Status: AC
  Filled 2015-01-12: qty 5

## 2015-01-12 MED ORDER — IOHEXOL 300 MG/ML  SOLN
INTRAMUSCULAR | Status: DC | PRN
  Administered 2015-01-12: 30 mL via INTRA_ARTERIAL

## 2015-01-12 MED ORDER — HYDRALAZINE HCL 20 MG/ML IJ SOLN: 5.0000 mg | INTRAMUSCULAR | Status: DC | PRN

## 2015-01-12 MED ORDER — FENTANYL CITRATE (PF) 100 MCG/2ML IJ SOLN
INTRAMUSCULAR | Status: AC
  Filled 2015-01-12: qty 2

## 2015-01-12 MED ORDER — FENTANYL CITRATE (PF) 100 MCG/2ML IJ SOLN
INTRAMUSCULAR | Status: DC | PRN
  Administered 2015-01-12 (×4): 50 ug via INTRAVENOUS

## 2015-01-12 MED ORDER — DIPHENHYDRAMINE HCL 50 MG/ML IJ SOLN
INTRAMUSCULAR | Status: DC | PRN
Start: 2015-01-12 — End: 2015-01-12
  Administered 2015-01-12: 50 mg via INTRAVENOUS

## 2015-01-12 MED ORDER — ACETAMINOPHEN 325 MG RE SUPP: 325.0000 mg | RECTAL | Status: DC | PRN

## 2015-01-12 MED ORDER — GUAIFENESIN-DM 100-10 MG/5ML PO SYRP: 15.0000 mL | ORAL_SOLUTION | ORAL | Status: DC | PRN

## 2015-01-12 MED ORDER — MORPHINE SULFATE (PF) 4 MG/ML IV SOLN: 2.0000 mg | INTRAVENOUS | Status: DC | PRN

## 2015-01-12 MED ORDER — SODIUM CHLORIDE 0.9 % IJ SOLN
INTRAMUSCULAR | Status: AC
  Filled 2015-01-12: qty 15

## 2015-01-12 MED ORDER — HEPARIN SODIUM (PORCINE) 1000 UNIT/ML IJ SOLN
INTRAMUSCULAR | Status: DC | PRN
  Administered 2015-01-12: 3000 [IU] via INTRAVENOUS

## 2015-01-12 MED ORDER — ALUM & MAG HYDROXIDE-SIMETH 200-200-20 MG/5ML PO SUSP: 15.0000 mL | ORAL | Status: DC | PRN

## 2015-01-12 MED ORDER — HEPARIN (PORCINE) IN NACL 2-0.9 UNIT/ML-% IJ SOLN
INTRAMUSCULAR | Status: AC
  Filled 2015-01-12: qty 1000

## 2015-01-12 MED ORDER — DIPHENHYDRAMINE HCL 50 MG/ML IJ SOLN
INTRAMUSCULAR | Status: AC
  Filled 2015-01-12: qty 1

## 2015-01-12 MED ORDER — LIDOCAINE-EPINEPHRINE (PF) 1 %-1:200000 IJ SOLN
INTRAMUSCULAR | Status: DC | PRN
  Administered 2015-01-12: 10 mL via INTRADERMAL

## 2015-01-12 MED ORDER — ONDANSETRON HCL 4 MG/2ML IJ SOLN: 4.0000 mg | Freq: Four times a day (QID) | INTRAMUSCULAR | Status: DC | PRN

## 2015-01-12 MED ORDER — PHENOL 1.4 % MT LIQD: 1.0000 | OROMUCOSAL | Status: DC | PRN

## 2015-01-12 MED ORDER — METHYLPREDNISOLONE SODIUM SUCC 125 MG IJ SOLR: 125.0000 mg | INTRAMUSCULAR | Status: DC | PRN

## 2015-01-12 MED ORDER — HEPARIN SODIUM (PORCINE) 1000 UNIT/ML IJ SOLN
INTRAMUSCULAR | Status: AC
  Filled 2015-01-12: qty 1

## 2015-01-12 MED ORDER — METOPROLOL TARTRATE 1 MG/ML IV SOLN: 2.0000 mg | INTRAVENOUS | Status: DC | PRN

## 2015-01-12 MED ORDER — MIDAZOLAM HCL 2 MG/2ML IJ SOLN
INTRAMUSCULAR | Status: AC
  Filled 2015-01-12: qty 2

## 2015-01-12 MED ORDER — SODIUM CHLORIDE 0.9 % IV SOLN
INTRAVENOUS | Status: DC
  Administered 2015-01-12 (×2): via INTRAVENOUS

## 2015-01-12 MED ORDER — LIDOCAINE-EPINEPHRINE (PF) 1 %-1:200000 IJ SOLN
INTRAMUSCULAR | Status: AC
  Filled 2015-01-12: qty 30

## 2015-01-12 MED ORDER — HYDROMORPHONE HCL 1 MG/ML IJ SOLN: 1.0000 mg | Freq: Once | INTRAMUSCULAR | Status: DC

## 2015-01-12 MED ORDER — FAMOTIDINE 20 MG PO TABS: 40.0000 mg | ORAL_TABLET | ORAL | Status: DC | PRN

## 2015-01-12 MED ORDER — ACETAMINOPHEN 325 MG PO TABS: 325.0000 mg | ORAL_TABLET | ORAL | Status: DC | PRN

## 2015-01-12 MED ORDER — MIDAZOLAM HCL 2 MG/2ML IJ SOLN
INTRAMUSCULAR | Status: DC | PRN
  Administered 2015-01-12 (×2): 2 mg via INTRAVENOUS
  Administered 2015-01-12: 1 mg via INTRAVENOUS

## 2015-01-12 MED ORDER — OXYCODONE-ACETAMINOPHEN 5-325 MG PO TABS: 1.0000 | ORAL_TABLET | ORAL | Status: DC | PRN

## 2015-01-12 MED ORDER — LABETALOL HCL 5 MG/ML IV SOLN: 10.0000 mg | INTRAVENOUS | Status: DC | PRN

## 2015-01-12 MED ORDER — DEXTROSE 5 % IV SOLN
1.5000 g | INTRAVENOUS | Status: AC
  Administered 2015-01-12: 1.5 g via INTRAVENOUS

## 2015-01-12 SURGICAL SUPPLY — 12 items
BALLN LUTONIX DCB 6X60X130 (BALLOONS) ×4
BALLN ULTRVRSE 7X40X75C (BALLOONS) ×4
BALLOON LUTONIX DCB 6X60X130 (BALLOONS) ×2 IMPLANT
BALLOON ULTRVRSE 7X40X75C (BALLOONS) ×2 IMPLANT
CANNULA 5F STIFF (CANNULA) ×4 IMPLANT
CATH KMP 5X40 (CATHETERS) ×4 IMPLANT
DEVICE PRESTO INFLATION (MISCELLANEOUS) ×4 IMPLANT
DRAPE BRACHIAL (DRAPES) ×4 IMPLANT
PACK ANGIOGRAPHY (CUSTOM PROCEDURE TRAY) ×4 IMPLANT
SHEATH BRITE TIP 6FRX5.5 (SHEATH) ×4 IMPLANT
TOWEL OR 17X26 4PK STRL BLUE (TOWEL DISPOSABLE) ×4 IMPLANT
WIRE MAGIC TORQUE 260C (WIRE) ×4 IMPLANT

## 2015-01-12 NOTE — Progress Notes (Signed)
Pt doing well post procedure, encouraged pt to take bp medication after she gets home, Dr Lucky Cowboy in to speak with pt with questions answered , taking po's without difficulty. Discharge instructions given with questions  Answered,

## 2015-01-12 NOTE — Discharge Instructions (Signed)
Fistulogram, Care After °Refer to this sheet in the next few weeks. These instructions provide you with information on caring for yourself after your procedure. Your health care provider may also give you more specific instructions. Your treatment has been planned according to current medical practices, but problems sometimes occur. Call your health care provider if you have any problems or questions after your procedure. °WHAT TO EXPECT AFTER THE PROCEDURE °After your procedure, it is typical to have the following: °· A small amount of discomfort in the area where the catheters were placed. °· A small amount of bruising around the fistula. °· Sleepiness and fatigue. °HOME CARE INSTRUCTIONS °· Rest at home for the day following your procedure. °· Do not drive or operate heavy machinery while taking pain medicine. °· Take medicines only as directed by your health care provider. °· Do not take baths, swim, or use a hot tub until your health care provider approves. You may shower 24 hours after the procedure or as directed by your health care provider. °· There are many different ways to close and cover an incision, including stitches, skin glue, and adhesive strips. Follow your health care provider's instructions on: °¨ Incision care. °¨ Bandage (dressing) changes and removal. °¨ Incision closure removal. °· Monitor your dialysis fistula carefully. °SEEK MEDICAL CARE IF: °· You have drainage, redness, swelling, or pain at your catheter site. °· You have a fever. °· You have chills. °SEEK IMMEDIATE MEDICAL CARE IF: °· You feel weak. °· You have trouble balancing. °· You have trouble moving your arms or legs. °· You have problems with your speech or vision. °· You can no longer feel a vibration or buzz when you put your fingers over your dialysis fistula. °· The limb that was used for the procedure: °¨ Swells. °¨ Is painful. °¨ Is cold. °¨ Is discolored, such as blue or pale white. °  °This information is not intended  to replace advice given to you by your health care provider. Make sure you discuss any questions you have with your health care provider. °  °Document Released: 06/03/2013 Document Reviewed: 06/03/2013 °Elsevier Interactive Patient Education ©2016 [employer]vier Inc. ° °

## 2015-01-12 NOTE — H&P (Signed)
[facility] Admission History & Physical  MRN : [id number]  [name] is a 29 y.o. (Apr 16, 1985) female who presents with chief complaint of No chief complaint on file. [name] Kitchen  History of Present Illness: Patient is a 29 year old female with long-standing end-stage renal disease. She has had no previous dialysis accesses that have failed, and is now using a right arm brachiocephalic fistula. This has low flow at dialysis and the dialysis access center has sent her here for further evaluation. She has no other complaints today.  Current Facility-Administered Medications  Medication Dose Route Frequency Provider Last Rate Last Dose  . 0.9 %  sodium chloride infusion   Intravenous Continuous Janalyn Harder Stegmayer, PA-C 10 mL/hr at 01/12/15 [id number]    . cefUROXime (ZINACEF) 1.5 g in dextrose 5 % 50 mL IVPB  1.5 g Intravenous 30 min Pre-Op Kimberly A Stegmayer, PA-C      . famotidine (PEPCID) tablet 40 mg  40 mg Oral PRN Janalyn Harder Stegmayer, PA-C      . HYDROmorphone (DILAUDID) injection 1 mg  1 mg Intravenous Once [employer], PA-C      . methylPREDNISolone sodium succinate (SOLU-MEDROL) 125 mg/2 mL injection 125 mg  125 mg Intravenous PRN Kimberly A Stegmayer, PA-C      . ondansetron (ZOFRAN) injection 4 mg  4 mg Intravenous Q6H PRN Sela Hua, PA-C        Past Medical History  Diagnosis Date  . Diabetes mellitus   . Hypertension   . Renal insufficiency   . Morbid obesity ([place])   . Asthma   . Irregular heart beat     Past Surgical History  Procedure Laterality Date  . Eye surgery      laser photocoagulation  . Av fistula placement  08/26/10    Left Radiocephalic AVF  . Peripheral vascular catheterization N/A 07/07/2014    Procedure: A/V Shuntogram/Fistulagram;  Surgeon: Algernon Huxley, MD;  Location: [facility];  Service: Cardiovascular;  Laterality: N/A;  . Peripheral vascular catheterization N/A 07/07/2014    Procedure: A/V Shunt  Intervention;  Surgeon: Algernon Huxley, MD;  Location: [facility];  Service: Cardiovascular;  Laterality: N/A;  . Intrauterine device (iud) insertion      Social History Social History  Substance Use Topics  . Smoking status: Current Every Day Smoker -- 0.00 packs/day for 10 years    Types: Cigarettes  . Smokeless tobacco: None  . Alcohol Use: No    Family History History reviewed. No pertinent family history.  No history of bleeding disorders, clotting disorders, or autoimmune diseases.  Allergies  Allergen Reactions  . Vancomycin Itching  . Latex Swelling and Rash     REVIEW OF SYSTEMS (Negative unless checked)  Constitutional: [] Weight loss  [] Fever  [] Chills Cardiac: [] Chest pain   [] Chest pressure   [] Palpitations   [] Shortness of breath when laying flat   [] Shortness of breath at rest   [] Shortness of breath with exertion. Vascular:  [] Pain in legs with walking   [] Pain in legs at rest   [] Pain in legs when laying flat   [] Claudication   [] Pain in feet when walking  [] Pain in feet at rest  [] Pain in feet when laying flat   [] History of DVT   [] Phlebitis   [] Swelling in legs   [] Varicose veins   [] Non-healing ulcers Pulmonary:   [] Uses home oxygen   [] Productive cough   [] Hemoptysis   [] Wheeze  []   COPD   [] Asthma Neurologic:  [] Dizziness  [] Blackouts   [] Seizures   [] History of stroke   [] History of TIA  [] Aphasia   [] Temporary blindness   [] Dysphagia   [] Weakness or numbness in arms   [] Weakness or numbness in legs Musculoskeletal:  [] Arthritis   [] Joint swelling   [] Joint pain   [] Low back pain Hematologic:  [] Easy bruising  [] Easy bleeding   [] Hypercoagulable state   [] Anemic  [] Hepatitis Gastrointestinal:  [] Blood in stool   [] Vomiting blood  [] Gastroesophageal reflux/heartburn   [] Difficulty swallowing. Genitourinary:  [x] Chronic kidney disease   [] Difficult urination  [] Frequent urination  [] Burning with urination   [] Blood in urine Skin:  [] Rashes   [] Ulcers    [] Wounds Psychological:  [] History of anxiety   []  History of major depression.  Physical Examination  Filed Vitals:   01/12/15 0708  BP: 148/102  Pulse: 90  Temp: 97.9 F (36.6 C)  TempSrc: Oral  Resp: 14  Height: 5\' 5"  (1.651 m)  Weight: 122.925 kg (271 lb)  SpO2: 98%   Body mass index is 45.1 kg/(m^2). Gen: WD/WN, NAD Head: [place]/AT, No temporalis wasting. Prominent temp pulse not noted. Ear/Nose/Throat: Hearing grossly intact, nares w/o erythema or drainage, oropharynx w/o Erythema/Exudate,  Eyes: PERRLA, EOMI.  Neck: Supple, no nuchal rigidity.  No bruit or JVD.  Pulmonary:  Good air movement, clear to auscultation bilaterally, no use of accessory muscles.  Cardiac: RRR, normal S1, S2, no Murmurs, rubs or gallops. Vascular:  Vessel Right Left  Radial Palpable Palpable                                   Gastrointestinal: soft, non-tender/non-distended. No guarding/reflex.  Musculoskeletal: M/S 5/5 throughout.  Extremities without ischemic changes.  No deformity or atrophy.  Neurologic: CN 2-12 intact. Pain and light touch intact in extremities.  Symmetrical.  Speech is fluent. Motor exam as listed above. Psychiatric: Judgment intact, Mood & affect appropriate for pt's clinical situation. Dermatologic: No rashes or ulcers noted.  No cellulitis or open wounds. Lymph : No Cervical, Axillary, or Inguinal lymphadenopathy.      CBC Lab Results  Component Value Date   WBC 5.7 07/19/2013   HGB 7.1* 07/20/2013   HCT 21.8* 07/19/2013   MCV 87 07/19/2013   PLT 162 07/19/2013    BMET    Component Value Date/Time   NA 132* 02/03/2014 1007   NA 135 08/26/2010 0631   K 4.7 02/03/2014 1007   K 3.9 08/26/2010 0631   CL 93* 02/03/2014 1007   CL 98 08/26/2010 0631   CO2 25 02/03/2014 1007   CO2 28 08/26/2010 0631   GLUCOSE 258* 02/03/2014 1007   GLUCOSE 101* 08/26/2010 0631   BUN 58* 02/03/2014 1007   BUN 16 08/26/2010 0631   CREATININE 11.14* 02/03/2014 1007    CREATININE 7.21* 08/26/2010 0631   CALCIUM 8.4* 02/03/2014 1007   CALCIUM 8.8 08/26/2010 0631   CALCIUM 8.1* 08/23/2010 1000   GFRNONAA 4* 02/03/2014 1007   GFRNONAA 5* 07/19/2013 0416   GFRNONAA 7* 08/26/2010 0631   GFRAA 5* 02/03/2014 1007   GFRAA 6* 07/19/2013 0416   GFRAA 8* 08/26/2010 0631   CrCl cannot be calculated (Patient has no serum creatinine result on file.).  COAG Lab Results  Component Value Date   INR 1.1 04/08/2013   INR 0.9 03/23/2011   INR 1.22 08/20/2010    Radiology No results found.  Assessment/Plan 1. Dysfunction of dialysis access. Plan for fistulogram today. Risks and benefits are discussed. She is agreeable to proceed. 2. End-stage renal disease. Long-standing. Needs better function for her fistula for her dialysis treatments. 3. Hypertension. Stable. Continue outpatient meds. 4. Diabetes. Glucose is high today. May need to increase her outpatient medical regimen.    DEW,JASON, MD  01/12/2015 8:18 AM

## 2015-01-12 NOTE — Op Note (Signed)
[facility]    OPERATIVE NOTE   PROCEDURE: 1.   Right brachiocephalic arteriovenous fistula cannulation under ultrasound guidance 2.   Right arm fistulagram including central venogram 3.   Percutaneous transluminal angioplasty of a perianastomotic stenosis with 6 mm diameter x 6 cm length Lutonix drug coated angioplasty balloon and a 7 mm conventional angioplasty balloon  PRE-OPERATIVE DIAGNOSIS: 1. ESRD 2. Poorly functional right brachiocephalic AVF  POST-OPERATIVE DIAGNOSIS: same as above   SURGEON: Leotis Pain, MD  ANESTHESIA: local with MCS  ESTIMATED BLOOD LOSS: minimal  FINDING(S): 1. 75-80% perianastomotic stenosis  SPECIMEN(S):  None  CONTRAST: 30 cc  INDICATIONS: [name] is a 29 y.o. female who presents with malfunctioning  right brachiocephalic arteriovenous fistula.  The patient is scheduled for  right arm fistulagram.  The patient is aware the risks include but are not limited to: bleeding, infection, thrombosis of the cannulated access, and possible anaphylactic reaction to the contrast.  The patient is aware of the risks of the procedure and elects to proceed forward.  DESCRIPTION: After full informed written consent was obtained, the patient was brought back to the angiography suite and placed supine upon the angiography table.  The patient was connected to monitoring equipment.  The  right arm was prepped and draped in the standard fashion for a percutaneous access intervention.  Under ultrasound guidance, the  right brachiocephalic arteriovenous fistula was cannulated with a micropuncture needle under direct ultrasound guidance and a permanent image was performed.  The microwire was advanced into the fistula and the needle was exchanged for the a microsheath.  I then upsized to a 6 Fr Sheath and imaging was performed.  Hand injections were completed to image the access including the central venous system. This demonstrated about a  75-80% stenosis just beyond the anastomosis of the brachiocephalic fistula and the cephalic vein. The remainder of the fistula was widely patent with good central venous outflow.  Based on the images, this patient will need intervention to this perianastomotic stenosis.. I then gave the patient 3000 units of intravenous heparin.  I then crossed the stenosis with a Magic Tourqe wire.  Based on the imaging, a 6 mm x 6 cm  Lutonix drug-coated angioplasty balloon was selected.  The balloon was centered around the perianastomotic stenosis with its distal tip just into the brachial artery and inflated to 12 ATM for 1 minute(s).  This was slightly undersized, so I then upsized to a 7 mm diameter by 4 cm length conventional angioplasty balloon. This was inflated to 14 atm for 1 minute. On completion imaging, a 20 % residual stenosis was present.     Based on the completion imaging, no further intervention is necessary.  The wire and balloon were removed from the sheath.  A 4-0 Monocryl purse-string suture was sewn around the sheath.  The sheath was removed while tying down the suture.  A sterile bandage was applied to the puncture site.  COMPLICATIONS: None  CONDITION: Stable   DEW,JASON  01/12/2015 9:08 AM

## 2015-01-13 ENCOUNTER — Encounter: Payer: Self-pay | Admitting: Vascular Surgery

## 2015-02-15 IMAGING — XA IR VASCULAR PROCEDURE
8 series · 15 of 15 positions shown · IV contrast (IODINE)
Comparison: none

[Series 1: care aorta · 2 of 2 slices shown (1 of 8)]
[im 1/2]
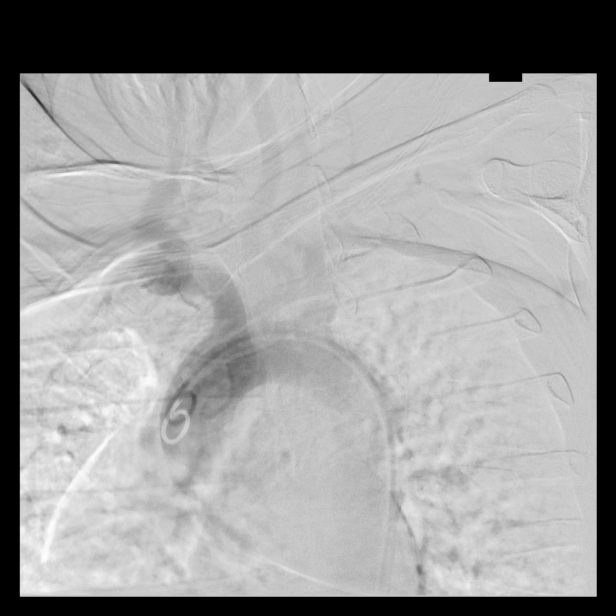
[im 2/2]
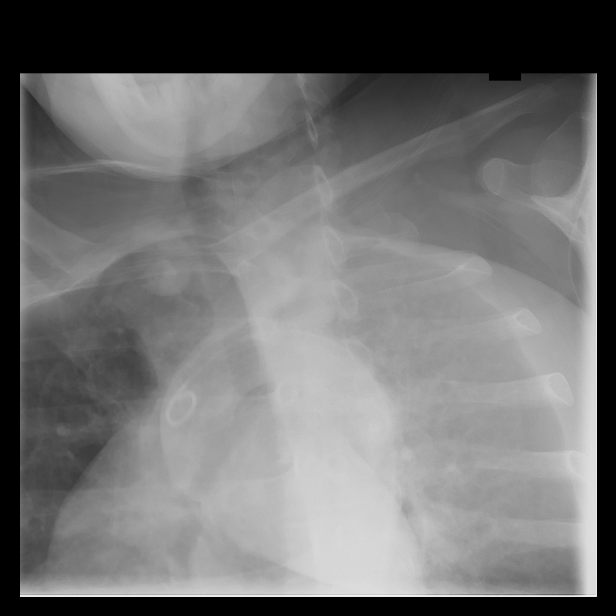

[Series 2: care aorta · 2 of 2 slices shown (2 of 8)]
[im 1/2]
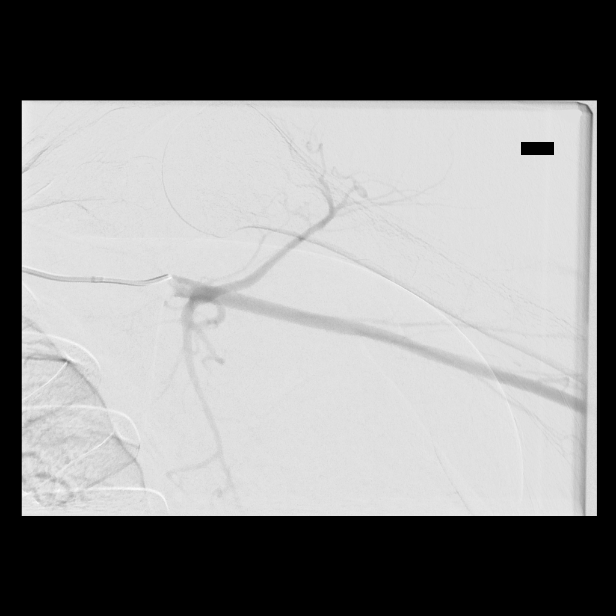
[im 2/2]
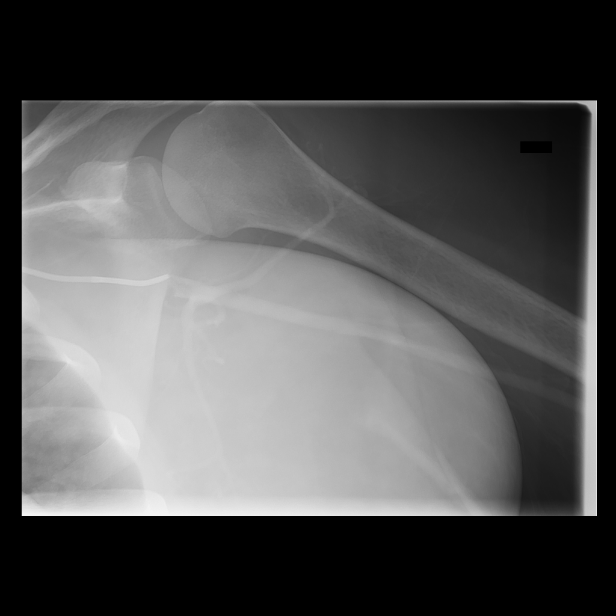

[Series 3: care aorta · 2 of 2 slices shown (3 of 8)]
[im 1/2]
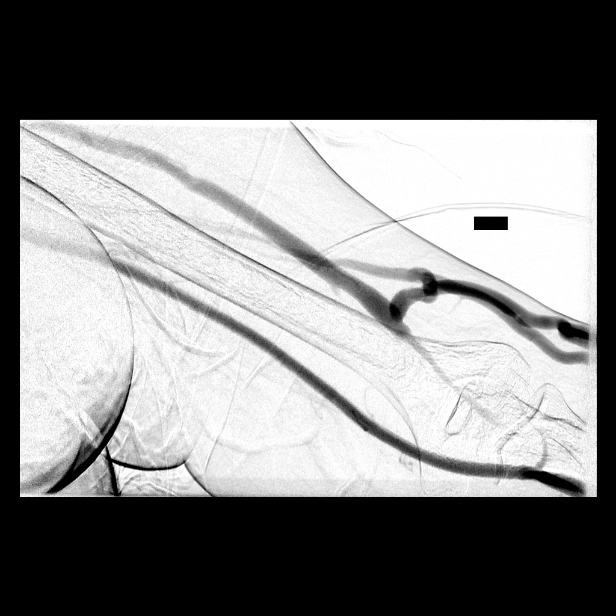
[im 2/2]
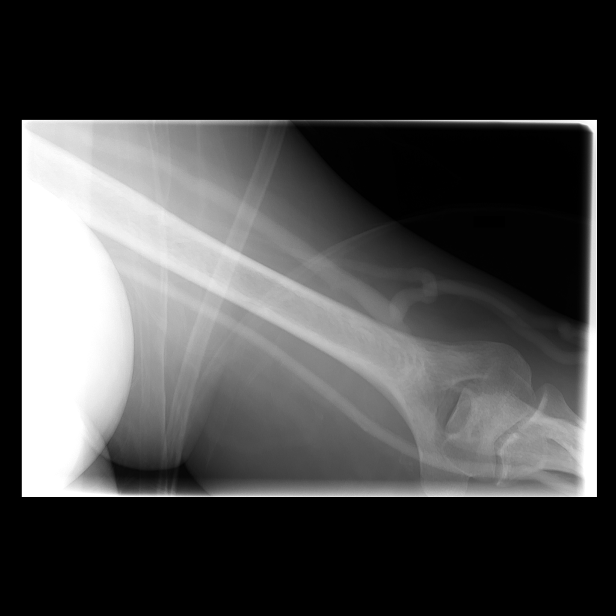

[Series 4: care aorta · 2 of 2 slices shown (4 of 8)]
[im 1/2]
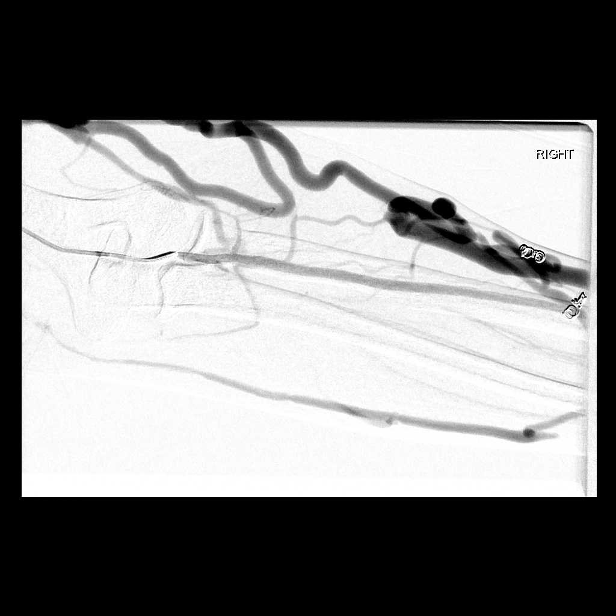
[im 2/2]
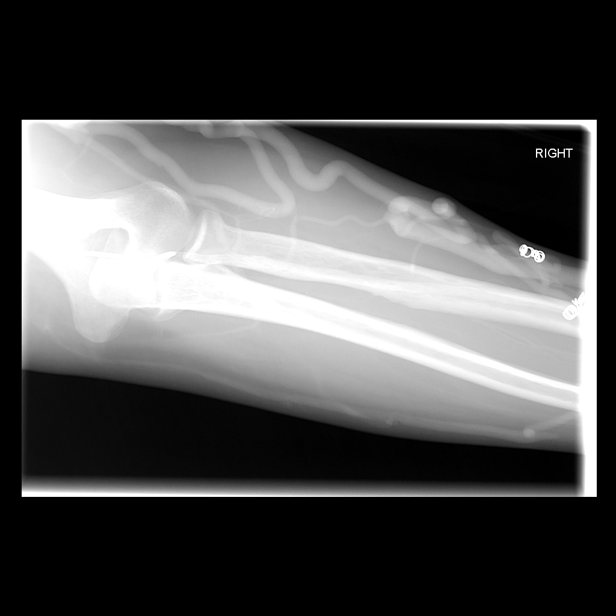

[Series 5: care aorta · 2 of 2 slices shown (5 of 8)]
[im 1/2]
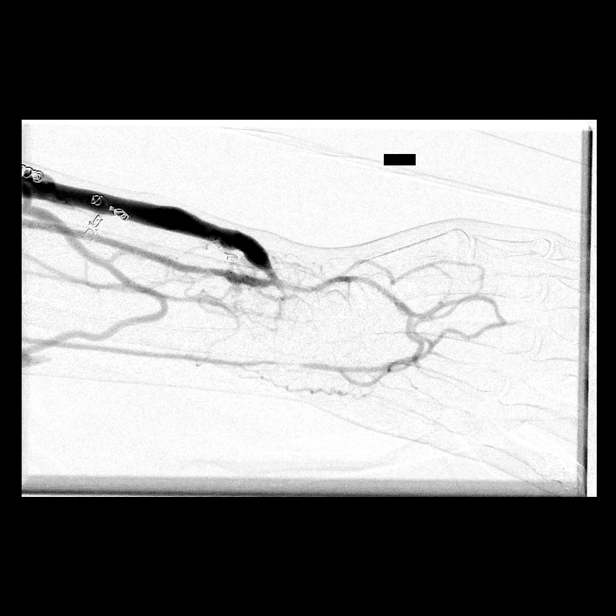
[im 2/2]
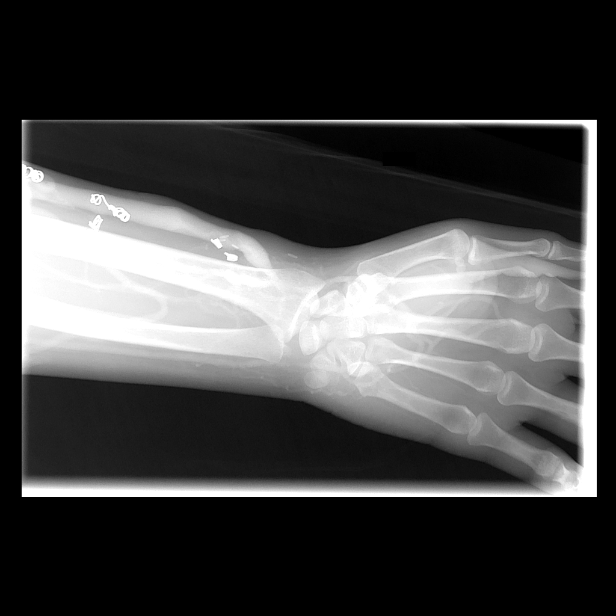

[Series 6: care aorta · 2 of 2 slices shown (6 of 8)]
[im 1/2]
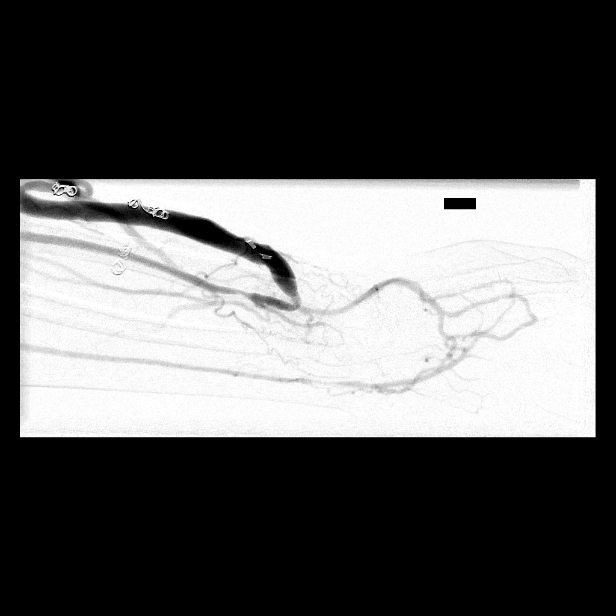
[im 2/2]
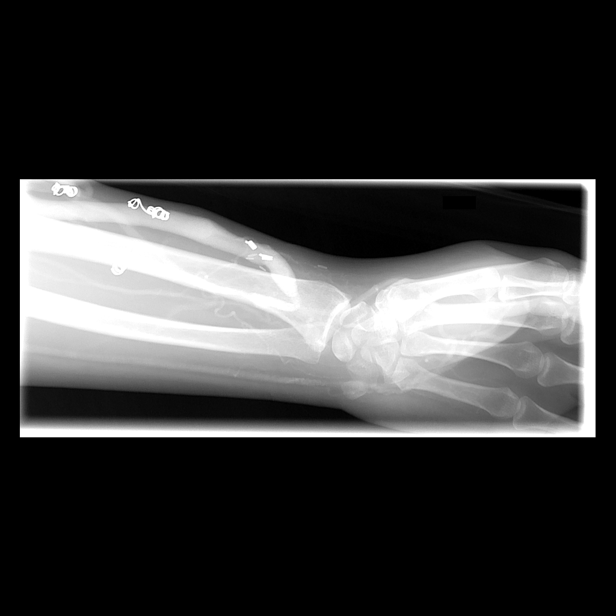

[Series 7: care aorta · 1 of 1 slices shown (7 of 8)]
[im 1/1]
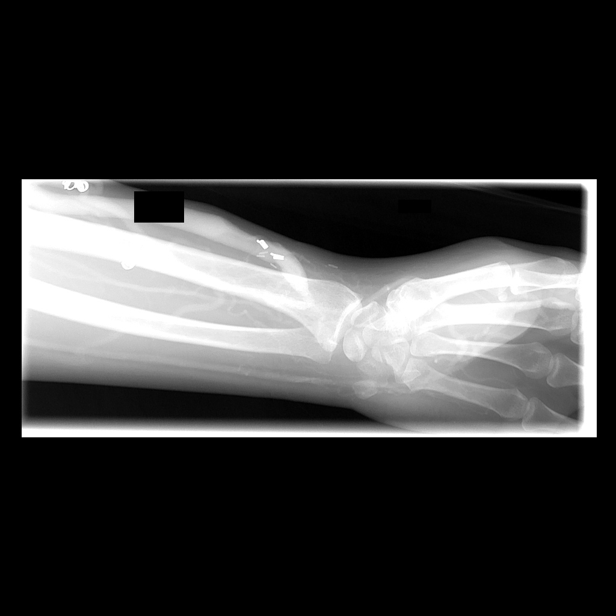

[Series 8: care aorta · 2 of 2 slices shown (8 of 8)]
[im 1/2]
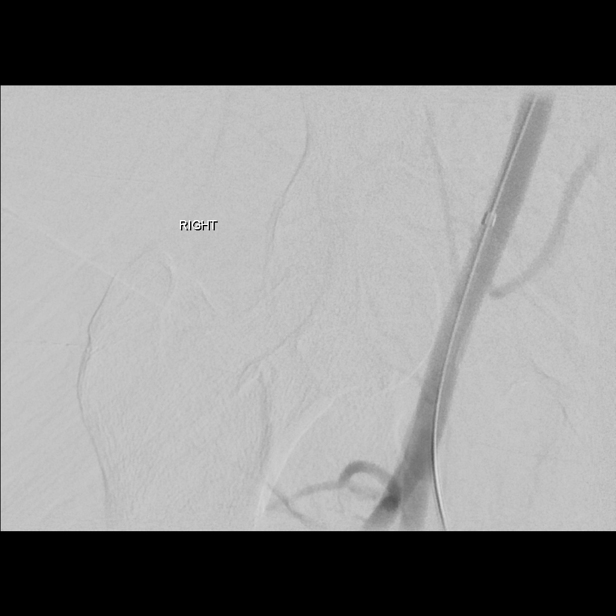
[im 2/2]
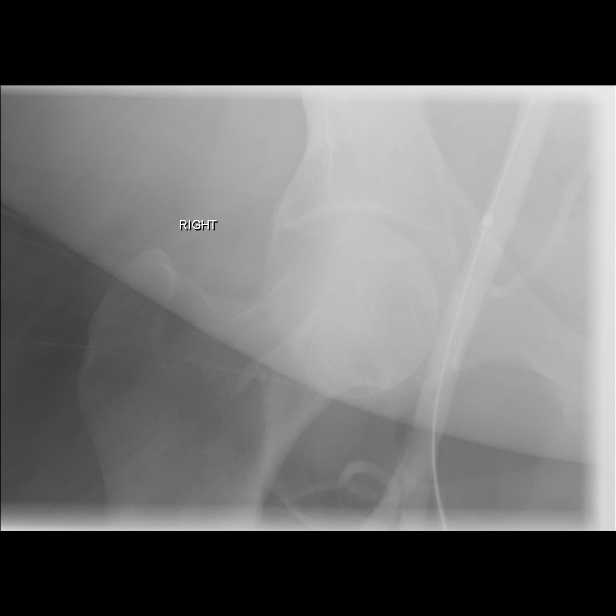

[15 of 15 positions shown; findings below may reference images not displayed]

IMAGES IMPORTED FROM THE SYNGO WORKFLOW SYSTEM
NO DICTATION FOR STUDY

## 2015-03-06 DIAGNOSIS — E785 Hyperlipidemia, unspecified: Secondary | ICD-10-CM | POA: Diagnosis present

## 2015-05-07 DIAGNOSIS — Z992 Dependence on renal dialysis: Secondary | ICD-10-CM | POA: Diagnosis not present

## 2015-05-07 DIAGNOSIS — F17210 Nicotine dependence, cigarettes, uncomplicated: Secondary | ICD-10-CM | POA: Diagnosis not present

## 2015-05-07 DIAGNOSIS — Z79899 Other long term (current) drug therapy: Secondary | ICD-10-CM | POA: Insufficient documentation

## 2015-05-07 DIAGNOSIS — Z7982 Long term (current) use of aspirin: Secondary | ICD-10-CM | POA: Insufficient documentation

## 2015-05-07 DIAGNOSIS — J45909 Unspecified asthma, uncomplicated: Secondary | ICD-10-CM | POA: Diagnosis not present

## 2015-05-07 DIAGNOSIS — Z794 Long term (current) use of insulin: Secondary | ICD-10-CM | POA: Insufficient documentation

## 2015-05-07 DIAGNOSIS — N186 End stage renal disease: Secondary | ICD-10-CM | POA: Diagnosis not present

## 2015-05-07 DIAGNOSIS — E119 Type 2 diabetes mellitus without complications: Secondary | ICD-10-CM | POA: Insufficient documentation

## 2015-05-07 DIAGNOSIS — E669 Obesity, unspecified: Secondary | ICD-10-CM | POA: Diagnosis not present

## 2015-05-07 DIAGNOSIS — Z8679 Personal history of other diseases of the circulatory system: Secondary | ICD-10-CM | POA: Insufficient documentation

## 2015-05-07 DIAGNOSIS — I120 Hypertensive chronic kidney disease with stage 5 chronic kidney disease or end stage renal disease: Secondary | ICD-10-CM | POA: Diagnosis not present

## 2015-05-07 DIAGNOSIS — R109 Unspecified abdominal pain: Secondary | ICD-10-CM | POA: Diagnosis not present

## 2015-05-07 DIAGNOSIS — M545 Low back pain: Secondary | ICD-10-CM | POA: Diagnosis present

## 2015-05-07 DIAGNOSIS — Z791 Long term (current) use of non-steroidal anti-inflammatories (NSAID): Secondary | ICD-10-CM | POA: Diagnosis not present

## 2015-05-07 DIAGNOSIS — N10 Acute tubulo-interstitial nephritis: Secondary | ICD-10-CM | POA: Insufficient documentation

## 2015-05-07 DIAGNOSIS — Z7952 Long term (current) use of systemic steroids: Secondary | ICD-10-CM | POA: Insufficient documentation

## 2015-05-07 NOTE — ED Notes (Signed)
Pt in with co left sided back pain radiates to abd, had same symptoms on right side a month ago and was told by [facility] that it was degenerative disc disease.

## 2015-05-08 ENCOUNTER — Emergency Department
Admission: EM | Admit: 2015-05-08 | Discharge: 2015-05-08 | Disposition: A | Discharge status: Home / Self Care | Payer: Medicare Other | Attending: Emergency Medicine | Admitting: Emergency Medicine

## 2015-05-08 ENCOUNTER — Emergency Department: Payer: Medicare Other

## 2015-05-08 ENCOUNTER — Encounter: Payer: Self-pay | Admitting: Emergency Medicine

## 2015-05-08 DIAGNOSIS — N10 Acute tubulo-interstitial nephritis: Secondary | ICD-10-CM | POA: Diagnosis not present

## 2015-05-08 DIAGNOSIS — N12 Tubulo-interstitial nephritis, not specified as acute or chronic: Secondary | ICD-10-CM

## 2015-05-08 LAB — URINALYSIS COMPLETE WITH MICROSCOPIC (ARMC ONLY)
BILIRUBIN URINE: NEGATIVE
Glucose, UA: >500 mg/dL — AB
HGB URINE DIPSTICK: NEGATIVE
Ketones, ur: NEGATIVE mg/dL
NITRITE: NEGATIVE
PH: 9 — AB (ref 5.0–8.0)
Protein, ur: 100 mg/dL — AB
Specific Gravity, Urine: 1.01 (ref 1.005–1.030)

## 2015-05-08 LAB — POCT PREGNANCY, URINE: Preg Test, Ur: NEGATIVE

## 2015-05-08 MED ORDER — OXYCODONE-ACETAMINOPHEN 5-325 MG PO TABS
2.0000 | ORAL_TABLET | Freq: Once | ORAL | Status: AC
  Administered 2015-05-08: 2 via ORAL
  Filled 2015-05-08: qty 2

## 2015-05-08 MED ORDER — OXYCODONE-ACETAMINOPHEN 5-325 MG PO TABS: 2.0000 | ORAL_TABLET | ORAL | Status: DC | PRN

## 2015-05-08 MED ORDER — CIPROFLOXACIN HCL 500 MG PO TABS: 500.0000 mg | ORAL_TABLET | Freq: Two times a day (BID) | ORAL | Status: AC

## 2015-05-08 NOTE — Discharge Instructions (Signed)

## 2015-05-08 NOTE — ED Provider Notes (Signed)
[facility] Emergency Department Provider Note  ____________________________________________  Time seen: 1:00 AM  I have reviewed the triage vital signs and the nursing notes.   HISTORY  Chief Complaint Back Pain      HPI [name] is a 30 y.o. female with history of hypertension diabetes and end-stage renal disease presents with left flank pain times "few days". Patient states current pain score is 9 out of 10. Patient denies any dysuria no hematuria patient denies any fever. Of note patient states that she had right musculoskeletal pain secondary to a pulled muscle a month ago which was diagnosed at [facility]. Patient denies any nausea no vomiting or diarrhea     Past Medical History  Diagnosis Date  . Diabetes mellitus   . Hypertension   . Renal insufficiency   . Morbid obesity ([place])   . Asthma   . Irregular heart beat     Patient Active Problem List   Diagnosis Date Noted  . Complication from renal dialysis device 07/07/2014  . End stage renal disease ([place]) 12/29/2010    Past Surgical History  Procedure Laterality Date  . Eye surgery      laser photocoagulation  . Av fistula placement  08/26/10    Left Radiocephalic AVF  . Peripheral vascular catheterization N/A 07/07/2014    Procedure: A/V Shuntogram/Fistulagram;  Surgeon: Algernon Huxley, MD;  Location: [facility];  Service: Cardiovascular;  Laterality: N/A;  . Peripheral vascular catheterization N/A 07/07/2014    Procedure: A/V Shunt Intervention;  Surgeon: Algernon Huxley, MD;  Location: [facility];  Service: Cardiovascular;  Laterality: N/A;  . Intrauterine device (iud) insertion    . Peripheral vascular catheterization Right 01/12/2015    Procedure: A/V Shuntogram/Fistulagram;  Surgeon: Algernon Huxley, MD;  Location: [facility];  Service: Cardiovascular;  Laterality: Right;  . Peripheral vascular catheterization N/A 01/12/2015    Procedure: A/V Shunt  Intervention;  Surgeon: Algernon Huxley, MD;  Location: [facility];  Service: Cardiovascular;  Laterality: N/A;    Current Outpatient Rx  Name  Route  Sig  Dispense  Refill  . amLODipine (NORVASC) 10 MG tablet   Oral   Take 10 mg by mouth at bedtime.           [name] Kitchen aspirin 81 MG tablet   Oral   Take 81 mg by mouth daily.           [name] Kitchen b complex-vitamin c-folic acid (NEPHRO-VITE) 0.8 MG TABS   Oral   Take 0.8 mg by mouth at bedtime.           . calcium acetate, Phos Binder, (PHOSLYRA) 667 MG/5ML SOLN   Oral   Take by mouth 3 (three) times daily with meals.         . cinacalcet (SENSIPAR) 30 MG tablet   Oral   Take 30 mg by mouth daily with supper.         . cyclobenzaprine (FLEXERIL) 10 MG tablet   Oral   Take 1 tablet (10 mg total) by mouth 3 (three) times daily as needed for muscle spasms.   15 tablet   0   . gabapentin (NEURONTIN) 100 MG capsule   Oral   Take 100 mg by mouth daily.          . insulin aspart (NOVOLOG) 100 UNIT/ML injection   Subcutaneous   Inject 4 Units into the skin 3 (three) times daily  before meals.          . insulin glargine (LANTUS) 100 UNIT/ML injection   Subcutaneous   Inject 23 Units into the skin at bedtime.         . metoprolol succinate (TOPROL-XL) 100 MG 24 hr tablet   Oral   Take 100 mg by mouth daily. Take with or immediately following a meal.         . omeprazole (PRILOSEC) 20 MG capsule   Oral   Take 20 mg by mouth daily.         [name] Kitchen oxyCODONE-acetaminophen (PERCOCET) 5-325 MG tablet   Oral   Take 1 tablet by mouth every 4 (four) hours as needed for severe pain.   20 tablet   0   . pravastatin (PRAVACHOL) 40 MG tablet   Oral   Take 40 mg by mouth daily.         . busPIRone (BUSPAR) 10 MG tablet   Oral   Take 10 mg by mouth daily.          . darbepoetin (ARANESP, ALB FREE, SURECLICK) [id number] SOLN   Subcutaneous   Inject 200 mcg into the skin every 7 (seven) days. Given every Wed. @@ HD           . desvenlafaxine (PRISTIQ) 50 MG 24 hr tablet   Oral   Take 50 mg by mouth daily.         . diazepam (VALIUM) 2 MG tablet   Oral   Take 1 tablet (2 mg total) by mouth every 8 (eight) hours as needed for muscle spasms.   9 tablet   0   . enoxaparin (LOVENOX) 30 MG/0.3ML SOLN   Subcutaneous   Inject 30 mg into the skin daily.           . insulin NPH-insulin regular (NOVOLIN 70/30) (70-30) 100 UNIT/ML injection   Subcutaneous   Inject 10 Units into the skin 2 (two) times daily.           [name] Kitchen labetalol (NORMODYNE) 200 MG tablet   Oral   Take 200 mg by mouth 2 (two) times daily.           [name] Kitchen levETIRAcetam (KEPPRA) 750 MG tablet   Oral   Take 750 mg by mouth 2 (two) times daily.         . meloxicam (MOBIC) 15 MG tablet   Oral   Take 1 tablet (15 mg total) by mouth daily.   30 tablet   0   . metoprolol succinate (TOPROL-XL) 25 MG 24 hr tablet   Oral   Take 25 mg by mouth daily.         . nicotine polacrilex (NICORETTE) 2 MG gum   Oral   Take 2 mg by mouth every 6 (six) hours as needed for smoking cessation.         . predniSONE (DELTASONE) 10 MG tablet   Oral   Take 1 tablet (10 mg total) by mouth as directed.   21 tablet   0     Take on a daily basis of 6, 5, 4, 3, 2, 1   . promethazine (PHENERGAN) 12.5 MG tablet   Oral   Take 12.5 mg by mouth every 6 (six) hours as needed for nausea or vomiting.         . sevelamer (RENAGEL) 800 MG tablet   Oral   Take 2,400 mg by mouth 3 (three) times daily with  meals.            Allergies Vancomycin; Latex; and Marcillin  History reviewed. No pertinent family history.  Social History Social History  Substance Use Topics  . Smoking status: Current Every Day Smoker -- 0.00 packs/day for 10 years    Types: Cigarettes  . Smokeless tobacco: None  . Alcohol Use: No    Review of Systems  Constitutional: Negative for fever. Eyes: Negative for visual changes. ENT: Negative for sore  throat. Cardiovascular: Negative for chest pain. Respiratory: Negative for shortness of breath. Gastrointestinal: Negative for abdominal pain, vomiting and diarrhea. Genitourinary: Negative for dysuria. Musculoskeletal: Positive for left back pain. Skin: Negative for rash. Neurological: Negative for headaches, focal weakness or numbness.   10-point ROS otherwise negative.  ____________________________________________   PHYSICAL EXAM:  VITAL SIGNS: ED Triage Vitals  Enc Vitals Group     BP 05/07/15 2313 135/84 mmHg     Pulse Rate 05/07/15 2313 69     Resp 05/07/15 2313 18     Temp 05/07/15 2313 98.2 F (36.8 C)     Temp Source 05/07/15 2313 Oral     SpO2 05/07/15 2313 93 %     Weight 05/07/15 2313 277 lb (125.646 kg)     Height 05/07/15 2313 5\' 4"  (1.626 m)     Head Cir --      Peak Flow --      Pain Score 05/07/15 2314 10     Pain Loc --      Pain Edu? --      Excl. in [place]? --      Constitutional: Alert and oriented. Well appearing and in no distress. Eyes: Conjunctivae are normal. PERRL. Normal extraocular movements. ENT   Head: Normocephalic and atraumatic.   Nose: No congestion/rhinnorhea.   Mouth/Throat: Mucous membranes are moist.   Neck: No stridor. Hematological/Lymphatic/Immunilogical: No cervical lymphadenopathy. Cardiovascular: Normal rate, regular rhythm. Normal and symmetric distal pulses are present in all extremities. No murmurs, rubs, or gallops. Respiratory: Normal respiratory effort without tachypnea nor retractions. Breath sounds are clear and equal bilaterally. No wheezes/rales/rhonchi. Gastrointestinal: Soft and nontender. No distention. There is no CVA tenderness. Genitourinary: Positive for left CVA tenderness Musculoskeletal: Nontender with normal range of motion in all extremities. No joint effusions.  No lower extremity tenderness nor edema. Neurologic:  Normal speech and language. No gross focal neurologic deficits are  appreciated. Speech is normal.  Skin:  Skin is warm, dry and intact. No rash noted. Psychiatric: Mood and affect are normal. Speech and behavior are normal. Patient exhibits appropriate insight and judgment.  ____________________________________________    LABS (pertinent positives/negatives)  Labs Reviewed  URINALYSIS COMPLETEWITH MICROSCOPIC ([facility] ONLY) - Abnormal; Notable for the following:    Color, Urine YELLOW (*)    APPearance CLOUDY (*)    Glucose, UA >500 (*)    pH 9.0 (*)    Protein, ur 100 (*)    Leukocytes, UA 2+ (*)    Bacteria, UA RARE (*)    Squamous Epithelial / LPF 6-30 (*)    All other components within normal limits  POC URINE PREG, ED  POCT PREGNANCY, URINE      RADIOLOGY  CT Renal Stone Study (Final result) Result time: 05/08/15 02:06:19   Final result by Rad Results In Interface (05/08/15 02:06:19)   Narrative:   CLINICAL DATA: Left flank pain.  EXAM: CT ABDOMEN AND PELVIS WITHOUT CONTRAST  TECHNIQUE: Multidetector CT imaging of the abdomen and pelvis was performed following  the standard protocol without IV contrast.  COMPARISON: None.  FINDINGS: Lower chest: Linear atelectasis in the lingula and right middle lobe. There are mitral annulus calcifications  Liver: Prominent in size. No evidence of focal lesion allowing for lack contrast.  Hepatobiliary: Gallbladder physiologically distended, no calcified stone. No biliary dilatation.  Pancreas: No ductal dilatation or inflammation.  Spleen: Upper normal in size. No focal abnormality.  Adrenal glands: No nodule.  Kidneys: Shrunken atrophic kidneys consistent chronic renal disease. No hydronephrosis. No urolithiasis. 1.5 cm partially exophytic lesion from the lower right kidney measures simple fluid density, most consistent with cyst.  Stomach/Bowel: Stomach distended with ingested contents. There are no dilated or thickened small bowel loops. Moderate volume of  stool throughout the colon without colonic wall thickening. A few cecal diverticula are seen. The appendix is normal.  Vascular/Lymphatic: No retroperitoneal adenopathy. Abdominal aorta is normal in caliber. Age advanced atherosclerosis.  Reproductive: Intrauterine device appropriately positioned in the uterus. No adnexal mass.  Bladder: Decompressed.  Other: No free air, free fluid, or intra-abdominal fluid collection.  Musculoskeletal: There are no acute or suspicious osseous abnormalities. Lobular hypodensity a spanning T9 and T10 intervertebral disc space with well-defined borders and no suspicious characteristics.  IMPRESSION: 1. No renal stones or obstructive uropathy. Atrophic kidneys consistent chronic renal disease. 2. No acute abnormality in the abdomen/pelvis.   Electronically Signed By: Jeb Levering M.D.        INITIAL IMPRESSION / ASSESSMENT AND PLAN / ED COURSE  Pertinent labs & imaging results that were available during my care of the patient were reviewed by me and considered in my medical decision making (see chart for details).   ____________________________________________   FINAL CLINICAL IMPRESSION(S) / ED DIAGNOSES  Final diagnoses:  Pyelonephritis      Gregor Hams, MD 05/08/15 [phone]

## 2015-05-28 ENCOUNTER — Other Ambulatory Visit: Payer: Self-pay | Admitting: Vascular Surgery

## 2015-06-01 ENCOUNTER — Other Ambulatory Visit: Payer: Self-pay | Admitting: Vascular Surgery

## 2015-06-01 HISTORY — PX: GASTRIC BYPASS: SHX52

## 2015-06-08 ENCOUNTER — Encounter
Admission: RE | Disposition: A | Discharge status: Home / Self Care | Payer: Self-pay | Source: Ambulatory Visit | Attending: Vascular Surgery

## 2015-06-08 ENCOUNTER — Ambulatory Visit
Admission: RE | Admit: 2015-06-08 | Discharge: 2015-06-08 | Disposition: A | Discharge status: Home / Self Care | Payer: Medicare Other | Source: Ambulatory Visit | Attending: Vascular Surgery | Admitting: Vascular Surgery

## 2015-06-08 DIAGNOSIS — Z880 Allergy status to penicillin: Secondary | ICD-10-CM | POA: Diagnosis not present

## 2015-06-08 DIAGNOSIS — N186 End stage renal disease: Secondary | ICD-10-CM | POA: Insufficient documentation

## 2015-06-08 DIAGNOSIS — Y832 Surgical operation with anastomosis, bypass or graft as the cause of abnormal reaction of the patient, or of later complication, without mention of misadventure at the time of the procedure: Secondary | ICD-10-CM | POA: Diagnosis not present

## 2015-06-08 DIAGNOSIS — I499 Cardiac arrhythmia, unspecified: Secondary | ICD-10-CM | POA: Insufficient documentation

## 2015-06-08 DIAGNOSIS — E1122 Type 2 diabetes mellitus with diabetic chronic kidney disease: Secondary | ICD-10-CM | POA: Insufficient documentation

## 2015-06-08 DIAGNOSIS — Z992 Dependence on renal dialysis: Secondary | ICD-10-CM | POA: Insufficient documentation

## 2015-06-08 DIAGNOSIS — Z6841 Body Mass Index (BMI) 40.0 and over, adult: Secondary | ICD-10-CM | POA: Diagnosis not present

## 2015-06-08 DIAGNOSIS — F17210 Nicotine dependence, cigarettes, uncomplicated: Secondary | ICD-10-CM | POA: Diagnosis not present

## 2015-06-08 DIAGNOSIS — T82858A Stenosis of vascular prosthetic devices, implants and grafts, initial encounter: Secondary | ICD-10-CM | POA: Diagnosis present

## 2015-06-08 DIAGNOSIS — J45909 Unspecified asthma, uncomplicated: Secondary | ICD-10-CM | POA: Insufficient documentation

## 2015-06-08 DIAGNOSIS — I120 Hypertensive chronic kidney disease with stage 5 chronic kidney disease or end stage renal disease: Secondary | ICD-10-CM | POA: Insufficient documentation

## 2015-06-08 HISTORY — PX: PERIPHERAL VASCULAR CATHETERIZATION: SHX172C

## 2015-06-08 LAB — POTASSIUM (ARMC VASCULAR LAB ONLY): Potassium (ARMC vascular lab): 4.7 (ref 3.5–5.1)

## 2015-06-08 SURGERY — A/V SHUNTOGRAM/FISTULAGRAM
Anesthesia: Moderate Sedation | Site: Arm Upper | Laterality: Right

## 2015-06-08 MED ORDER — LIDOCAINE-EPINEPHRINE (PF) 1 %-1:200000 IJ SOLN
INTRAMUSCULAR | Status: DC | PRN
  Administered 2015-06-08: 10 mL via INTRADERMAL

## 2015-06-08 MED ORDER — FENTANYL CITRATE (PF) 100 MCG/2ML IJ SOLN
INTRAMUSCULAR | Status: AC
  Filled 2015-06-08: qty 2

## 2015-06-08 MED ORDER — ONDANSETRON HCL 4 MG/2ML IJ SOLN: 4.0000 mg | Freq: Four times a day (QID) | INTRAMUSCULAR | Status: DC | PRN

## 2015-06-08 MED ORDER — CLINDAMYCIN PHOSPHATE 300 MG/50ML IV SOLN: 300.0000 mg | Freq: Once | INTRAVENOUS | Status: DC

## 2015-06-08 MED ORDER — IOPAMIDOL (ISOVUE-300) INJECTION 61%
INTRAVENOUS | Status: DC | PRN
  Administered 2015-06-08: 25 mL via INTRA_ARTERIAL

## 2015-06-08 MED ORDER — MIDAZOLAM HCL 5 MG/5ML IJ SOLN
INTRAMUSCULAR | Status: AC
  Filled 2015-06-08: qty 5

## 2015-06-08 MED ORDER — FENTANYL CITRATE (PF) 100 MCG/2ML IJ SOLN
INTRAMUSCULAR | Status: DC | PRN
  Administered 2015-06-08 (×2): 50 ug via INTRAVENOUS

## 2015-06-08 MED ORDER — LIDOCAINE-EPINEPHRINE (PF) 1 %-1:200000 IJ SOLN
INTRAMUSCULAR | Status: AC
  Filled 2015-06-08: qty 30

## 2015-06-08 MED ORDER — DEXTROSE 5 % IV SOLN
INTRAVENOUS | Status: AC
  Filled 2015-06-08 (×18): qty 1.5

## 2015-06-08 MED ORDER — HYDROMORPHONE HCL 1 MG/ML IJ SOLN: 1.0000 mg | Freq: Once | INTRAMUSCULAR | Status: DC

## 2015-06-08 MED ORDER — HEPARIN (PORCINE) IN NACL 2-0.9 UNIT/ML-% IJ SOLN
INTRAMUSCULAR | Status: AC
  Filled 2015-06-08: qty 1000

## 2015-06-08 MED ORDER — METHYLPREDNISOLONE SODIUM SUCC 125 MG IJ SOLR: 125.0000 mg | INTRAMUSCULAR | Status: DC | PRN

## 2015-06-08 MED ORDER — HEPARIN SODIUM (PORCINE) 1000 UNIT/ML IJ SOLN
INTRAMUSCULAR | Status: DC | PRN
  Administered 2015-06-08: 4000 [IU] via INTRAVENOUS

## 2015-06-08 MED ORDER — FAMOTIDINE 20 MG PO TABS: 40.0000 mg | ORAL_TABLET | ORAL | Status: DC | PRN

## 2015-06-08 MED ORDER — SODIUM CHLORIDE 0.9 % IV SOLN
INTRAVENOUS | Status: DC
  Administered 2015-06-08: 08:00:00 via INTRAVENOUS

## 2015-06-08 MED ORDER — HEPARIN SODIUM (PORCINE) 1000 UNIT/ML IJ SOLN
INTRAMUSCULAR | Status: AC
  Filled 2015-06-08: qty 1

## 2015-06-08 MED ORDER — MIDAZOLAM HCL 2 MG/2ML IJ SOLN
INTRAMUSCULAR | Status: DC | PRN
  Administered 2015-06-08: 2 mg via INTRAVENOUS
  Administered 2015-06-08: 1 mg via INTRAVENOUS

## 2015-06-08 MED ORDER — CLINDAMYCIN PHOSPHATE 300 MG/50ML IV SOLN
INTRAVENOUS | Status: AC
  Administered 2015-06-08: 08:00:00
  Filled 2015-06-08: qty 50

## 2015-06-08 SURGICAL SUPPLY — 17 items
BALLN DORADO 8X60X80 (BALLOONS) ×4
BALLN ULTRVRSE 9X60X75 (BALLOONS) ×4
BALLOON DORADO 8X60X80 (BALLOONS) ×2 IMPLANT
BALLOON ULTRVRSE 9X60X75 (BALLOONS) ×2 IMPLANT
CANNULA 5F STIFF (CANNULA) ×8 IMPLANT
DEVICE PRESTO INFLATION (MISCELLANEOUS) ×4 IMPLANT
DRAPE BRACHIAL (DRAPES) ×4 IMPLANT
PACK ANGIOGRAPHY (CUSTOM PROCEDURE TRAY) ×4 IMPLANT
SET INTRO CAPELLA COAXIAL (SET/KITS/TRAYS/PACK) ×4 IMPLANT
SHEATH BRITE TIP 6FRX5.5 (SHEATH) ×4 IMPLANT
SHEATH BRITE TIP 7FRX5.5 (SHEATH) ×4 IMPLANT
STENT VIABAHN 8X50X120 (Permanent Stent) ×4 IMPLANT
SUT MNCRL AB 4-0 PS2 18 (SUTURE) ×4 IMPLANT
TOWEL OR 17X26 4PK STRL BLUE (TOWEL DISPOSABLE) ×4 IMPLANT
WIRE G 018X200 V18 (WIRE) ×4 IMPLANT
WIRE J 3MM .035X145CM (WIRE) ×4 IMPLANT
WIRE MAGIC TOR.035 180C (WIRE) ×4 IMPLANT

## 2015-06-08 NOTE — Op Note (Signed)
[facility]    OPERATIVE NOTE   PROCEDURE: 1.   Right brachiocephalic arteriovenous fistula cannulation under ultrasound guidance 2.   Right arm fistulagram including central venogram 3.   Percutaneous transluminal angioplasty of mid upper arm cephalic vein with 28m diameter by 6 cm length high pressure angioplasty balloon 4.   Viabahn stent placement to the mid upper arm cephalic vein with an 8 mm diameter by 5 cm length stent for residual stenosis after angioplasty  PRE-OPERATIVE DIAGNOSIS: 1. ESRD 2. Poorly functional right brachiocephalic AVF  POST-OPERATIVE DIAGNOSIS: same as above   SURGEON: Sheryl Pain MD  ANESTHESIA: local with MCS  ESTIMATED BLOOD LOSS: Minimal  FINDING(S): 1. 80 to 90% stenosis in the mid upper arm cephalic vein  SPECIMEN(S):  None  CONTRAST: 25 cc  FLUORO TIME: 1.5 minutes  MODERATE CONSCIOUS SEDATION TIME: Approximately 30 minutes with 3 mg of Versed and 100 mcg of Fentanyl   INDICATIONS: Sheryl KOHLIis a 30y.o. female who presents with malfunctioning  right brachiocephalic arteriovenous fistula.  The patient is scheduled for  right arm fistulagram.  The patient is aware the risks include but are not limited to: bleeding, infection, thrombosis of the cannulated access, and possible anaphylactic reaction to the contrast.  The patient is aware of the risks of the procedure and elects to proceed forward.  DESCRIPTION: After full informed written consent was obtained, the patient was brought back to the angiography suite and placed supine upon the angiography table.  The patient was connected to monitoring equipment. Moderate conscious sedation was administered with a face to face encounter with the patient throughout the procedure with my supervision of the RN administering medicines and monitoring the patient's vital signs and mental status throughout from the start of the procedure until the patient was taken to the  recovery room. The  right arm was prepped and draped in the standard fashion for a percutaneous access intervention.  Under ultrasound guidance, the  right brachiocephalic arteriovenous fistula was cannulated with a micropuncture needle under direct ultrasound guidance and a permanent image was performed.  The microwire was advanced into the fistula and the needle was exchanged for the a microsheath.  I then upsized to a 6 Fr Sheath and imaging was performed.  Hand injections were completed to image the access including the central venous system. This demonstrated an 80-90% stenosis in the mid upper arm cephalic vein. The remainder of the fistula appeared widely patent although the arterial anastomosis was difficult to opacify due to drainage out a small cephalic vein branch. The central venous flow was normal area Based on the images, this patient will need intervention to this area to ensure function of the fistula and avoid thrombosis. I then gave the patient 4000 units of intravenous heparin.  I then crossed the stenosis with a Magic Tourqe wire.  Based on the imaging, a 8 mm x 6 cm  high pressure angioplasty balloon was selected.  The balloon was centered around the mid upper arm cephalic vein stenosis and inflated to 20 ATM for 1 minute(s).  On completion imaging, a 70+ % residual stenosis was present.  I elected to treat this area with a Viabahn covered stent. I exchanged for 7 French sheath and then exchanged for a 0.018 wire. An 8 mm diameter by 5 cm length Viabahn stent was then deployed encompassing the lesion and postdilated with a 9 mm balloon with only about a 20% residual stenosis present after stent  placement.   Based on the completion imaging, no further intervention is necessary.  The wire and balloon were removed from the sheath.  A 4-0 Monocryl purse-string suture was sewn around the sheath.  The sheath was removed while tying down the suture.  A sterile bandage was applied to the puncture  site.  COMPLICATIONS: None  CONDITION: Stable   DEW,JASON  06/08/2015 8:52 AM

## 2015-06-08 NOTE — H&P (Signed)
[facility] Admission History & Physical  MRN : [id number]  [name] is a 30 y.o. (1986-01-25) female who presents with chief complaint of No chief complaint on file. [name] Kitchen  History of Present Illness: Patient is sent over from her dialysis access center for poorly functioning AV fistula. She has no other complaints and feels well today. She denies fevers, chills, signs of systemic infection, chest pain, or shortness of breath.  Current Facility-Administered Medications  Medication Dose Route Frequency Provider Last Rate Last Dose  . 0.9 %  sodium chloride infusion   Intravenous Continuous Kimberly A Stegmayer, PA-C 10 mL/hr at 06/08/15 0730    . clindamycin (CLEOCIN) 300 MG/50ML IVPB           . clindamycin (CLEOCIN) IVPB 300 mg  300 mg Intravenous Once [employer], PA-C      . famotidine (PEPCID) tablet 40 mg  40 mg Oral PRN Janalyn Harder Stegmayer, PA-C      . HYDROmorphone (DILAUDID) injection 1 mg  1 mg Intravenous Once [employer], PA-C      . methylPREDNISolone sodium succinate (SOLU-MEDROL) 125 mg/2 mL injection 125 mg  125 mg Intravenous PRN Kimberly A Stegmayer, PA-C      . ondansetron (ZOFRAN) injection 4 mg  4 mg Intravenous Q6H PRN Sela Hua, PA-C        Past Medical History  Diagnosis Date  . Diabetes mellitus   . Hypertension   . Renal insufficiency   . Morbid obesity ([place])   . Asthma   . Irregular heart beat     Past Surgical History  Procedure Laterality Date  . Eye surgery      laser photocoagulation  . Av fistula placement  08/26/10    Left Radiocephalic AVF  . Peripheral vascular catheterization N/A 07/07/2014    Procedure: A/V Shuntogram/Fistulagram;  Surgeon: Algernon Huxley, MD;  Location: [facility];  Service: Cardiovascular;  Laterality: N/A;  . Peripheral vascular catheterization N/A 07/07/2014    Procedure: A/V Shunt Intervention;  Surgeon: Algernon Huxley, MD;  Location: [facility];   Service: Cardiovascular;  Laterality: N/A;  . Intrauterine device (iud) insertion    . Peripheral vascular catheterization Right 01/12/2015    Procedure: A/V Shuntogram/Fistulagram;  Surgeon: Algernon Huxley, MD;  Location: [facility];  Service: Cardiovascular;  Laterality: Right;  . Peripheral vascular catheterization N/A 01/12/2015    Procedure: A/V Shunt Intervention;  Surgeon: Algernon Huxley, MD;  Location: [facility];  Service: Cardiovascular;  Laterality: N/A;    Social History Social History  Substance Use Topics  . Smoking status: Current Every Day Smoker -- 0.00 packs/day for 10 years    Types: Cigarettes  . Smokeless tobacco: Not on file  . Alcohol Use: No  No IVDU  Family History No bleeding disorders, clotting disorders, aneurysms, or autoimmune diseases  Allergies  Allergen Reactions  . Vancomycin Itching  . Latex Swelling and Rash  . Marcillin [Ampicillin] Rash     REVIEW OF SYSTEMS (Negative unless checked)  Constitutional: [] Weight loss  [] Fever  [] Chills Cardiac: [] Chest pain   [] Chest pressure   [] Palpitations   [] Shortness of breath when laying flat   [] Shortness of breath at rest   [] Shortness of breath with exertion. Vascular:  [] Pain in legs with walking   [] Pain in legs at rest   [] Pain in legs when laying flat   [] Claudication   [] Pain  in feet when walking  [] Pain in feet at rest  [] Pain in feet when laying flat   [] History of DVT   [] Phlebitis   [] Swelling in legs   [] Varicose veins   [] Non-healing ulcers Pulmonary:   [] Uses home oxygen   [] Productive cough   [] Hemoptysis   [] Wheeze  [] COPD   [] Asthma Neurologic:  [] Dizziness  [] Blackouts   [] Seizures   [] History of stroke   [] History of TIA  [] Aphasia   [] Temporary blindness   [] Dysphagia   [] Weakness or numbness in arms   [] Weakness or numbness in legs Musculoskeletal:  [] Arthritis   [] Joint swelling   [] Joint pain   [] Low back pain Hematologic:  [] Easy bruising  [] Easy bleeding    [] Hypercoagulable state   [] Anemic  [] Hepatitis Gastrointestinal:  [] Blood in stool   [] Vomiting blood  [] Gastroesophageal reflux/heartburn   [] Difficulty swallowing. Genitourinary:  [x] Chronic kidney disease   [] Difficult urination  [] Frequent urination  [] Burning with urination   [] Blood in urine Skin:  [] Rashes   [] Ulcers   [] Wounds Psychological:  [] History of anxiety   []  History of major depression.  Physical Examination  Filed Vitals:   06/08/15 0712  BP: 171/92  Pulse: 98  Temp: 98.3 F (36.8 C)  TempSrc: Oral  Resp: 20  Height: 5\' 4"  (1.626 m)  Weight: 125.646 kg (277 lb)  SpO2: 99%   Body mass index is 47.52 kg/(m^2). Gen: WD/WN, NAD. Obese  Head: [place]/AT, No temporalis wasting. Prominent temp pulse not noted. Ear/Nose/Throat: Hearing grossly intact, nares w/o erythema or drainage, oropharynx w/o Erythema/Exudate,  Eyes: PERRLA, EOMI.  Neck: Supple, no nuchal rigidity.  No JVD.  Pulmonary:  Good air movement, equal bilaterally, no use of accessory muscles.  Cardiac: RRR, normal S1, S2 Vascular: thrill and bruit present in AVF Vessel Right Left  Radial Palpable Palpable                                   Gastrointestinal: soft, non-tender/non-distended. No guarding/reflex.  Musculoskeletal: M/S 5/5 throughout.  Extremities without ischemic changes.  No deformity or atrophy.  Neurologic: CN 2-12 intact. Pain and light touch intact in extremities.  Symmetrical.  Speech is fluent. Motor exam as listed above. Psychiatric: Judgment intact, Mood & affect appropriate for pt's clinical situation. Dermatologic: No rashes or ulcers noted.  No cellulitis or open wounds. Lymph : No Cervical, Axillary, or Inguinal lymphadenopathy.    CBC Lab Results  Component Value Date   WBC 5.7 07/19/2013   HGB 7.1* 07/20/2013   HCT 21.8* 07/19/2013   MCV 87 07/19/2013   PLT 162 07/19/2013    BMET    Component Value Date/Time   NA 132* 02/03/2014 1007   NA 135 08/26/2010  0631   K 4.7 02/03/2014 1007   K 3.9 08/26/2010 0631   CL 93* 02/03/2014 1007   CL 98 08/26/2010 0631   CO2 25 02/03/2014 1007   CO2 28 08/26/2010 0631   GLUCOSE 258* 02/03/2014 1007   GLUCOSE 101* 08/26/2010 0631   BUN 58* 02/03/2014 1007   BUN 16 08/26/2010 0631   CREATININE 11.14* 02/03/2014 1007   CREATININE 7.21* 08/26/2010 0631   CALCIUM 8.4* 02/03/2014 1007   CALCIUM 8.8 08/26/2010 0631   CALCIUM 8.1* 08/23/2010 1000   GFRNONAA 4* 02/03/2014 1007   GFRNONAA 5* 07/19/2013 0416   GFRNONAA 7* 08/26/2010 0631   GFRAA 5* 02/03/2014 1007   GFRAA 6* 07/19/2013  0416   GFRAA 8* 08/26/2010 0631   CrCl cannot be calculated (Patient has no serum creatinine result on file.).  COAG Lab Results  Component Value Date   INR 1.1 04/08/2013   INR 0.9 03/23/2011   INR 1.22 08/20/2010    Radiology No results found.    Assessment/Plan 1. Dysfunction of dialysis access. Sent from dialysis center for fistulogram today. Risks and benefits discussed 2. End-stage renal disease. Has been on dialysis for many years. 3. Hypertension. Stable on outpatient medications.    DEW,JASON, MD  06/08/2015 7:59 AM

## 2015-06-08 NOTE — Discharge Instructions (Signed)
Fistulogram, Care After °Refer to this sheet in the next few weeks. These instructions provide you with information on caring for yourself after your procedure. Your health care provider may also give you more specific instructions. Your treatment has been planned according to current medical practices, but problems sometimes occur. Call your health care provider if you have any problems or questions after your procedure. °WHAT TO EXPECT AFTER THE PROCEDURE °After your procedure, it is typical to have the following: °· A small amount of discomfort in the area where the catheters were placed. °· A small amount of bruising around the fistula. °· Sleepiness and fatigue. °HOME CARE INSTRUCTIONS °· Rest at home for the day following your procedure. °· Do not drive or operate heavy machinery while taking pain medicine. °· Take medicines only as directed by your health care provider. °· Do not take baths, swim, or use a hot tub until your health care provider approves. You may shower 24 hours after the procedure or as directed by your health care provider. °· There are many different ways to close and cover an incision, including stitches, skin glue, and adhesive strips. Follow your health care provider's instructions on: °¨ Incision care. °¨ Bandage (dressing) changes and removal. °¨ Incision closure removal. °· Monitor your dialysis fistula carefully. °SEEK MEDICAL CARE IF: °· You have drainage, redness, swelling, or pain at your catheter site. °· You have a fever. °· You have chills. °SEEK IMMEDIATE MEDICAL CARE IF: °· You feel weak. °· You have trouble balancing. °· You have trouble moving your arms or legs. °· You have problems with your speech or vision. °· You can no longer feel a vibration or buzz when you put your fingers over your dialysis fistula. °· The limb that was used for the procedure: °¨ Swells. °¨ Is painful. °¨ Is cold. °¨ Is discolored, such as blue or pale white. °  °This information is not intended  to replace advice given to you by your health care provider. Make sure you discuss any questions you have with your health care provider. °  °Document Released: 06/03/2013 Document Reviewed: 06/03/2013 °Elsevier Interactive Patient Education ©2016 [employer]vier Inc. ° °

## 2015-06-09 ENCOUNTER — Encounter: Payer: Self-pay | Admitting: Vascular Surgery

## 2015-06-17 DIAGNOSIS — Z9884 Bariatric surgery status: Secondary | ICD-10-CM

## 2015-06-17 HISTORY — DX: Bariatric surgery status: Z98.84

## 2015-07-13 ENCOUNTER — Emergency Department
Admission: EM | Admit: 2015-07-13 | Discharge: 2015-07-13 | Disposition: A | Discharge status: Home / Self Care | Payer: Medicare Other | Attending: Emergency Medicine | Admitting: Emergency Medicine

## 2015-07-13 ENCOUNTER — Other Ambulatory Visit: Payer: Self-pay | Admitting: Vascular Surgery

## 2015-07-13 DIAGNOSIS — Y828 Other medical devices associated with adverse incidents: Secondary | ICD-10-CM | POA: Diagnosis not present

## 2015-07-13 DIAGNOSIS — Z91040 Latex allergy status: Secondary | ICD-10-CM | POA: Insufficient documentation

## 2015-07-13 DIAGNOSIS — E1122 Type 2 diabetes mellitus with diabetic chronic kidney disease: Secondary | ICD-10-CM | POA: Insufficient documentation

## 2015-07-13 DIAGNOSIS — Z7982 Long term (current) use of aspirin: Secondary | ICD-10-CM | POA: Insufficient documentation

## 2015-07-13 DIAGNOSIS — I120 Hypertensive chronic kidney disease with stage 5 chronic kidney disease or end stage renal disease: Secondary | ICD-10-CM | POA: Insufficient documentation

## 2015-07-13 DIAGNOSIS — T829XXA Unspecified complication of cardiac and vascular prosthetic device, implant and graft, initial encounter: Secondary | ICD-10-CM

## 2015-07-13 DIAGNOSIS — T8249XA Other complication of vascular dialysis catheter, initial encounter: Secondary | ICD-10-CM | POA: Insufficient documentation

## 2015-07-13 DIAGNOSIS — Z992 Dependence on renal dialysis: Secondary | ICD-10-CM | POA: Insufficient documentation

## 2015-07-13 DIAGNOSIS — Z79899 Other long term (current) drug therapy: Secondary | ICD-10-CM | POA: Insufficient documentation

## 2015-07-13 DIAGNOSIS — F17210 Nicotine dependence, cigarettes, uncomplicated: Secondary | ICD-10-CM | POA: Diagnosis not present

## 2015-07-13 DIAGNOSIS — J45909 Unspecified asthma, uncomplicated: Secondary | ICD-10-CM | POA: Insufficient documentation

## 2015-07-13 DIAGNOSIS — N186 End stage renal disease: Secondary | ICD-10-CM | POA: Insufficient documentation

## 2015-07-13 DIAGNOSIS — Z7984 Long term (current) use of oral hypoglycemic drugs: Secondary | ICD-10-CM | POA: Insufficient documentation

## 2015-07-13 LAB — BASIC METABOLIC PANEL
Anion gap: 14 (ref 5–15)
BUN: 29 mg/dL — AB (ref 6–20)
CALCIUM: 8.8 mg/dL — AB (ref 8.9–10.3)
CO2: 28 mmol/L (ref 22–32)
CREATININE: 13.92 mg/dL — AB (ref 0.44–1.00)
Chloride: 96 mmol/L — ABNORMAL LOW (ref 101–111)
GFR, EST AFRICAN AMERICAN: 4 mL/min — AB (ref >60)
GFR, EST NON AFRICAN AMERICAN: 3 mL/min — AB (ref >60)
Glucose, Bld: 119 mg/dL — ABNORMAL HIGH (ref 65–99)
Potassium: 3.7 mmol/L (ref 3.5–5.1)
SODIUM: 138 mmol/L (ref 135–145)

## 2015-07-13 LAB — CBC
HCT: 30.8 % — ABNORMAL LOW (ref 35.0–47.0)
Hemoglobin: 10 g/dL — ABNORMAL LOW (ref 12.0–16.0)
MCH: 28.3 pg (ref 26.0–34.0)
MCHC: 32.5 g/dL (ref 32.0–36.0)
MCV: 86.9 fL (ref 80.0–100.0)
PLATELETS: 228 10*3/uL (ref 150–440)
RBC: 3.54 MIL/uL — ABNORMAL LOW (ref 3.80–5.20)
RDW: 16.9 % — AB (ref 11.5–14.5)
WBC: 7 10*3/uL (ref 3.6–11.0)

## 2015-07-13 NOTE — ED Notes (Signed)
Pt in via triage with complaints of fistula not working properly; pt reports she was unable to get dialysis on Saturday due to the access infiltrating.  Pt was advised to come to ED because she could not be seen by her MD today.

## 2015-07-13 NOTE — ED Notes (Signed)
Pt states on Saturday they were not able to perform her dialysis in the right upper arm access due to clots.. States [facility] instructed her to come to the ED to get treatment since they can not see her in there office this week.

## 2015-07-13 NOTE — Discharge Instructions (Signed)
Please come to the medical mall at about noon tomorrow for Dr. Lucky Cowboy to perform a fistulogram at 12:30 PM.  Do not eat or drink anything after midnight to be ready for your procedure.  Return to the emergency department if he develop new or worsening symptoms that concern you.   Chronic Kidney Disease Chronic kidney disease occurs when the kidneys are damaged over a long period. The kidneys are two organs that lie on either side of the spine between the middle of the back and the front of the abdomen. The kidneys:  Remove wastes and extra water from the blood.  Produce important hormones. These help keep bones strong, regulate blood pressure, and help create red blood cells.  Balance the fluids and chemicals in the blood and tissues. A small amount of kidney damage may not cause problems, but a large amount of damage may make it difficult or impossible for the kidneys to work the way they should. If steps are not taken to slow down the kidney damage or stop it from getting worse, the kidneys may stop working permanently. Most of the time, chronic kidney disease does not go away. However, it can often be controlled, and those with the disease can usually live normal lives. CAUSES The most common causes of chronic kidney disease are diabetes and high blood pressure (hypertension). Chronic kidney disease may also be caused by:  Diseases that cause the kidneys' filters to become inflamed.  Diseases that affect the immune system.  Genetic diseases.  Medicines that damage the kidneys, such as anti-inflammatory medicines.  Poisoning or exposure to toxic substances.  A reoccurring kidney or urinary infection.  A problem with urine flow. This may be caused by:  Cancer.  Kidney stones.  An enlarged prostate in males. SIGNS AND SYMPTOMS Because the kidney damage in chronic kidney disease occurs slowly, symptoms develop slowly and may not be obvious until the kidney damage becomes severe. A  person may have a kidney disease for years without showing any symptoms. Symptoms can include:  Swelling (edema) of the legs, ankles, or feet.  Tiredness (lethargy).  Nausea or vomiting.  Confusion.  Problems with urination, such as:  Decreased urine production.  Frequent urination, especially at night.  Frequent accidents in children who are potty trained.  Muscle twitches and cramps.  Shortness of breath.  Weakness.  Persistent itchiness.  Loss of appetite.  Metallic taste in the mouth.  Trouble sleeping.  Slowed development in children.  Short stature in children. DIAGNOSIS Chronic kidney disease may be detected and diagnosed by tests, including blood, urine, imaging, or kidney biopsy tests. TREATMENT Most chronic kidney diseases cannot be cured. Treatment usually involves relieving symptoms and preventing or slowing the progression of the disease. Treatment may include:  A special diet. You may need to avoid alcohol and foods thatare salty and high in potassium.  Medicines. These may:  Lower blood pressure.  Relieve anemia.  Relieve swelling.  Protect the bones. HOME CARE INSTRUCTIONS  Follow your prescribed diet. Your health care provider may instruct you to limit daily salt (sodium) and protein intake.  Take medicines only as directed by your health care provider. Do not take any new medicines (prescription, over-the-counter, or nutritional supplements) unless approved by your health care provider. Many medicines can worsen your kidney damage or need to have the dose adjusted.   Quit smoking if you smoke. Talk to your health care provider about a smoking cessation program.  Keep all follow-up visits as directed  by your health care provider.  Monitor your blood pressure.  Start or continue an exercise plan.  Get immunizations as directed by your health care provider.  Take vitamin and mineral supplements as directed by your health care  provider. SEEK IMMEDIATE MEDICAL CARE IF:  Your symptoms get worse or you develop new symptoms.  You develop symptoms of end-stage kidney disease. These include:  Headaches.  Abnormally dark or light skin.  Numbness in the hands or feet.  Easy bruising.  Frequent hiccups.  Menstruation stops.  You have a fever.  You have decreased urine production.  You havepain or bleeding when urinating. MAKE SURE YOU:  Understand these instructions.  Will watch your condition.  Will get help right away if you are not doing well or get worse. FOR MORE INFORMATION   American Association of Kidney Patients: BombTimer.gl  National Kidney Foundation: www.kidney.Centre: https://mathis.com/  [facility]: www.lifeoptions.org and www.kidneyschool.org   This information is not intended to replace advice given to you by your health care provider. Make sure you discuss any questions you have with your health care provider.   Document Released: 10/27/2007 Document Revised: 02/07/2014 Document Reviewed: 09/16/2011 Elsevier Interactive Patient Education [employer].  Dialysis Dialysis is a procedure that replaces some of the work healthy kidneys do. It is done when you lose about 85-90% of your kidney function. It may also be done earlier if your symptoms may be improved by dialysis. During dialysis, wastes, salt, and extra water are removed from the blood, and the levels of certain chemicals in the blood (such as potassium) are maintained. Dialysis is done in sessions. Dialysis sessions are continued until the kidneys get better. If the kidneys cannot get better, such as in end-stage kidney disease, dialysis is continued for life or until you receive a new kidney (kidney transplant). There are two types of dialysis: hemodialysis and peritoneal dialysis. WHAT IS HEMODIALYSIS?  Hemodialysis is a type of dialysis in which a machine called a dialyzer is  used to filter the blood. Before beginning hemodialysis, you will have surgery to create a site where blood can be removed from the body and returned to the body (vascular access). There are three types of vascular accesses:  Arteriovenous fistula. To create this type of access, an artery is connected to a vein (usually in the arm). A fistula takes 1-6 months to develop after surgery. If it develops properly, it usually lasts longer than the other types of vascular accesses. It is also less likely to become infected and cause blood clots.  Arteriovenous graft. To create this type of access, an artery and a vein in the arm are connected with a tube. A graft may be used within 2-3 weeks of surgery.  A venous catheter. To create this type of access, a thin, flexible tube (catheter) is placed in a large vein in your neck, chest, or groin. A catheter may be used right away. It is usually used as a temporary access when dialysis needs to begin immediately. During hemodialysis, blood leaves the body through your access. It travels through a tube to the dialyzer, where it is filtered. The blood then returns to your body through another tube. Hemodialysis is usually performed by a health care provider at a hospital or dialysis center three times a week. Visits last about 3-4 hours. It may also be performed with the help of another person at home with training.  WHAT IS PERITONEAL DIALYSIS? Peritoneal dialysis  is a type of dialysis in which the thin lining of the abdomen (peritoneum) is used as a filter. Before beginning peritoneal dialysis, you will have surgery to place a catheter in your abdomen. The catheter will be used to transfer a fluid called dialysate to and from your abdomen. At the start of a session, your abdomen is filled with dialysate. During the session, wastes, salt, and extra water in the blood pass through the peritoneum and into the dialysate. The dialysate is drained from the body at the end of  the session. The process of filling and draining the dialysate is called an exchange. Exchanges are repeated until you have used up all the dialysate for the day. Peritoneal dialysis may be performed by you at home or at almost any other location. It is done every day. You may need up to five exchanges a day. The amount of time the dialysate is in your body between exchanges is called a dwell. The dwell depends on the number of exchanges needed and the characteristics of the peritoneum. It usually varies from 1.5-3 hours. You may go about your day normally between exchanges. Alternately, the exchanges may be done at night while you sleep, using a machine called a cycler. WHICH TYPE OF DIALYSIS SHOULD I CHOOSE?  Both hemodialysis and peritoneal dialysis have advantages and disadvantages. Talk to your health care provider about which type of dialysis would be best for you. Your lifestyle and preferences should be considered along with your medical condition. In some cases, only one type of dialysis may be an option.  Advantages of hemodialysis  It is done less often than peritoneal dialysis.  Someone else can do the dialysis for you.  If you go to a dialysis center, your health care provider will be able to recognize any problems right away.  If you go to a dialysis center, you can interact with others who are having dialysis. This can provide you with emotional support. Disadvantages of hemodialysis  Hemodialysis may cause cramps and low blood pressure. It may leave you feeling tired on the days you have the treatment.  If you go to a dialysis center, you will need to make weekly appointments and work around the center's schedule.  You will need to take extra care when traveling. If you go to a dialysis center, you will need to make special arrangements to visit a dialysis center near your destination. If you are having treatments at home, you will need to take the dialyzer with you to your  destination.  You will need to avoid more foods than you would need to avoid on peritoneal dialysis. Advantages of peritoneal dialysis  It is less likely than hemodialysis to cause cramps and low blood pressure.  You may do exchanges on your own wherever you are, including when you travel.  You do not need to avoid as many foods as you do on hemodialysis. Disadvantages of peritoneal dialysis  It is done more often than hemodialysis.  Performing peritoneal dialysis requires you to have dexterity of the hands. You must also be able to lift bags.  You will have to learn sterilization techniques. You will need to practice them every day to reduce the risk of infection. WHAT CHANGES WILL I NEED TO MAKE TO MY DIET DURING DIALYSIS? Both hemodialysis and peritoneal dialysis require you to make some changes to your diet. For example, you will need to limit your intake of foods high in the minerals phosphorus and potassium. You will  also need to limit your fluid intake. Your dietitian can help you plan meals. A good meal plan can improve your dialysis and your health.  WHAT SHOULD I EXPECT WHEN BEGINNING DIALYSIS? Adjusting to the dialysis treatment, schedule, and diet can take some time. You may need to stop working and may not be able to do some of the things you normally do. You may feel anxious or depressed when beginning dialysis. Eventually, many people feel better overall because of dialysis. Some people are able to return to work after making some changes, such as reducing work intensity. WHERE CAN I FIND MORE INFORMATION?   [place]: www.kidney.org  American Association of Kidney Patients: BombTimer.gl  American Kidney Fund: www.kidneyfund.org   This information is not intended to replace advice given to you by your health care provider. Make sure you discuss any questions you have with your health care provider.   Document Released: 04/09/2002 Document Revised:  02/07/2014 Document Reviewed: 03/13/2012 Elsevier Interactive Patient Education [employer].

## 2015-07-13 NOTE — ED Provider Notes (Signed)
[facility] Emergency Department Provider Note  ____________________________________________  Time seen: Approximately 1:54 PM  I have reviewed the triage vital signs and the nursing notes.   HISTORY  Chief Complaint Vascular Access Problem    HPI [name] is a 30 y.o. female with ESRD on HD (T, Th, Sat) and who has had prior problems with dialysis access who presents at the suggestion of her dialysis center.  Reportedly since having a stent placed by Dr. Lucky Cowboy last month, her access has been functional, but last week the center started "drawing back clots" and having access issues.  Two days ago (Sat) they were only able to dialyze for about 30 minutes and they said that the access was not functional.  Today she was contacted and told that she should come to the ED because the center contacted [facility] and was told they would not be able to see her this week.  She denies CP, SOB, N/V/D, dysuria (she still produces urine).  Though the access problem is severe and gradual in onset, she has no other symptoms.     Past Medical History  Diagnosis Date  . Diabetes mellitus   . Hypertension   . Renal insufficiency   . Morbid obesity ([place])   . Asthma   . Irregular heart beat     Patient Active Problem List   Diagnosis Date Noted  . Complication from renal dialysis device 07/07/2014  . End stage renal disease ([place]) 12/29/2010    Past Surgical History  Procedure Laterality Date  . Eye surgery      laser photocoagulation  . Av fistula placement  08/26/10    Left Radiocephalic AVF  . Peripheral vascular catheterization N/A 07/07/2014    Procedure: A/V Shuntogram/Fistulagram;  Surgeon: Algernon Huxley, MD;  Location: [place] [facility];  Service: Cardiovascular;  Laterality: N/A;  . Peripheral vascular catheterization N/A 07/07/2014    Procedure: A/V Shunt Intervention;  Surgeon: Algernon Huxley, MD;  Location: [place] [facility];  Service:  Cardiovascular;  Laterality: N/A;  . Intrauterine device (iud) insertion    . Peripheral vascular catheterization Right 01/12/2015    Procedure: A/V Shuntogram/Fistulagram;  Surgeon: Algernon Huxley, MD;  Location: [place] [facility];  Service: Cardiovascular;  Laterality: Right;  . Peripheral vascular catheterization N/A 01/12/2015    Procedure: A/V Shunt Intervention;  Surgeon: Algernon Huxley, MD;  Location: [place] [facility];  Service: Cardiovascular;  Laterality: N/A;  . Peripheral vascular catheterization Right 06/08/2015    Procedure: A/V Shuntogram/Fistulagram;  Surgeon: Algernon Huxley, MD;  Location: [place] [facility];  Service: Cardiovascular;  Laterality: Right;  . Peripheral vascular catheterization N/A 06/08/2015    Procedure: A/V Shunt Intervention;  Surgeon: Algernon Huxley, MD;  Location: [place] [facility];  Service: Cardiovascular;  Laterality: N/A;    Current Outpatient Rx  Name  Route  Sig  Dispense  Refill  . amLODipine (NORVASC) 10 MG tablet   Oral   Take 10 mg by mouth at bedtime.           [name] Kitchen aspirin 81 MG tablet   Oral   Take 81 mg by mouth daily.           [name] Kitchen b complex-vitamin c-folic acid (NEPHRO-VITE) 0.8 MG TABS   Oral   Take 0.8 mg by mouth at bedtime.           . busPIRone (BUSPAR) 10 MG tablet  Oral   Take 10 mg by mouth daily. Reported on 06/08/2015         . calcium acetate, Phos Binder, (PHOSLYRA) 667 MG/5ML SOLN   Oral   Take by mouth 3 (three) times daily with meals. Reported on 06/08/2015         . cinacalcet (SENSIPAR) 30 MG tablet   Oral   Take 30 mg by mouth daily with supper.         . cyclobenzaprine (FLEXERIL) 10 MG tablet   Oral   Take 1 tablet (10 mg total) by mouth 3 (three) times daily as needed for muscle spasms.   15 tablet   0   . darbepoetin (ARANESP, ALB FREE, SURECLICK) [id number] SOLN   Subcutaneous   Inject 200 mcg into the skin every 7 (seven) days. Reported on 06/08/2015         . desvenlafaxine (PRISTIQ) 50 MG  24 hr tablet   Oral   Take 50 mg by mouth daily.         . diazepam (VALIUM) 2 MG tablet   Oral   Take 1 tablet (2 mg total) by mouth every 8 (eight) hours as needed for muscle spasms.   9 tablet   0   . enoxaparin (LOVENOX) 30 MG/0.3ML SOLN   Subcutaneous   Inject 30 mg into the skin daily.           [name] Kitchen gabapentin (NEURONTIN) 100 MG capsule   Oral   Take 100 mg by mouth daily.          . insulin aspart (NOVOLOG) 100 UNIT/ML injection   Subcutaneous   Inject 4 Units into the skin 3 (three) times daily before meals.          . insulin glargine (LANTUS) 100 UNIT/ML injection   Subcutaneous   Inject 23 Units into the skin at bedtime.         . insulin NPH-insulin regular (NOVOLIN 70/30) (70-30) 100 UNIT/ML injection   Subcutaneous   Inject 10 Units into the skin 2 (two) times daily.           [name] Kitchen labetalol (NORMODYNE) 200 MG tablet   Oral   Take 200 mg by mouth 2 (two) times daily. Reported on 06/08/2015         . levETIRAcetam (KEPPRA) 750 MG tablet   Oral   Take 750 mg by mouth 2 (two) times daily.         . meloxicam (MOBIC) 15 MG tablet   Oral   Take 1 tablet (15 mg total) by mouth daily.   30 tablet   0   . metoprolol succinate (TOPROL-XL) 100 MG 24 hr tablet   Oral   Take 100 mg by mouth daily. Reported on 06/08/2015         . metoprolol succinate (TOPROL-XL) 25 MG 24 hr tablet   Oral   Take 25 mg by mouth daily. Reported on 06/08/2015         . nicotine polacrilex (NICORETTE) 2 MG gum   Oral   Take 2 mg by mouth every 6 (six) hours as needed for smoking cessation. Reported on 06/08/2015         . omeprazole (PRILOSEC) 20 MG capsule   Oral   Take 20 mg by mouth daily.         [name] Kitchen oxyCODONE-acetaminophen (PERCOCET) 5-325 MG tablet   Oral   Take 1 tablet by mouth every 4 (four) hours  as needed for severe pain.   20 tablet   0   . oxyCODONE-acetaminophen (PERCOCET/ROXICET) 5-325 MG tablet   Oral   Take 2 tablets by mouth every 4 (four)  hours as needed for severe pain.   15 tablet   0   . pravastatin (PRAVACHOL) 40 MG tablet   Oral   Take 40 mg by mouth daily.         . predniSONE (DELTASONE) 10 MG tablet   Oral   Take 1 tablet (10 mg total) by mouth as directed. Patient not taking: Reported on 06/08/2015   21 tablet   0     Take on a daily basis of 6, 5, 4, 3, 2, 1   . promethazine (PHENERGAN) 12.5 MG tablet   Oral   Take 12.5 mg by mouth every 6 (six) hours as needed for nausea or vomiting.         . sevelamer (RENAGEL) 800 MG tablet   Oral   Take 2,400 mg by mouth 3 (three) times daily with meals.            Allergies Vancomycin; Latex; and Marcillin  No family history on file.  Social History Social History  Substance Use Topics  . Smoking status: Current Every Day Smoker -- 0.00 packs/day for 10 years    Types: Cigarettes  . Smokeless tobacco: None  . Alcohol Use: No    Review of Systems Constitutional: No fever/chills Eyes: No visual changes. ENT: No sore throat. Cardiovascular: Denies chest pain. Respiratory: Denies shortness of breath. Gastrointestinal: No abdominal pain.  No nausea, no vomiting.  No diarrhea.  No constipation. Genitourinary: Negative for dysuria. Musculoskeletal: Negative for back pain. Skin: Negative for rash. Neurological: Negative for headaches, focal weakness or numbness.  10-point ROS otherwise negative.  ____________________________________________   PHYSICAL EXAM:  VITAL SIGNS: ED Triage Vitals  Enc Vitals Group     BP 07/13/15 1112 178/100 mmHg     Pulse Rate 07/13/15 1112 95     Resp 07/13/15 1112 18     Temp 07/13/15 1112 98.1 F (36.7 C)     Temp Source 07/13/15 1112 Oral     SpO2 07/13/15 1112 100 %     Weight 07/13/15 1112 259 lb (117.482 kg)     Height 07/13/15 1112 5\' 4"  (1.626 m)     Head Cir --      Peak Flow --      Pain Score 07/13/15 1113 5     Pain Loc --      Pain Edu? --      Excl. in [place]? --     Constitutional: Alert  and oriented. Well appearing and in no acute distress. Eyes: Conjunctivae are normal. PERRL. EOMI. Head: Atraumatic. Nose: No congestion/rhinnorhea. Mouth/Throat: Mucous membranes are moist.  Oropharynx non-erythematous. Neck: No stridor.  No meningeal signs.   Cardiovascular: Normal rate, regular rhythm. Good peripheral circulation. Grossly normal heart sounds.  The patient has a palpable pulse in the right antecubital fossa but there is no palpable thrill over the dialysis excess site. Respiratory: Normal respiratory effort.  No retractions. Lungs CTAB. Gastrointestinal: Soft and nontender. No distention.  Musculoskeletal: No lower extremity tenderness nor edema. No gross deformities of extremities. Neurologic:  Normal speech and language. No gross focal neurologic deficits are appreciated.  Skin:  Skin is warm, dry and intact. No rash noted. Psychiatric: Mood and affect are normal. Speech and behavior are normal.  ____________________________________________   LABS (all  labs ordered are listed, but only abnormal results are displayed)  Labs Reviewed  CBC - Abnormal; Notable for the following:    RBC 3.54 (*)    Hemoglobin 10.0 (*)    HCT 30.8 (*)    RDW 16.9 (*)    All other components within normal limits  BASIC METABOLIC PANEL - Abnormal; Notable for the following:    Chloride 96 (*)    Glucose, Bld 119 (*)    BUN 29 (*)    Creatinine, Ser 13.92 (*)    Calcium 8.8 (*)    GFR calc non Af Amer 3 (*)    GFR calc Af Amer 4 (*)    All other components within normal limits   ____________________________________________  EKG  ED ECG REPORT I, Kellyn Mansfield, the attending physician, personally viewed and interpreted this ECG.  Date: 07/13/2015 EKG Time: 11:21 Rate: 94 Rhythm: normal sinus rhythm QRS Axis: normal Intervals: normal ST/T Wave abnormalities: normal Conduction Disturbances: none Narrative Interpretation:  unremarkable  ____________________________________________  RADIOLOGY   No results found.  ____________________________________________   PROCEDURES  Procedure(s) performed: None  Critical Care performed: No ____________________________________________   INITIAL IMPRESSION / ASSESSMENT AND PLAN / ED COURSE  Pertinent labs & imaging results that were available during my care of the patient were reviewed by me and considered in my medical decision making (see chart for details).  I called and spoke with Sharyn Lull, who works with Dr. Lucky Cowboy.  They discussed the case and Dr. dew said for the patient to come to the medical mall tomorrow for a fistulogram at 12:30.  I updated the patient and told her not to eat or drink anything after midnight.  The patient has reassuring labs including no significant electrolyte abnormalities and no EKG changes.  She is in no distress and having no difficulty breathing with no evidence to suggest pulmonary edema.  She is comfortable with plan for follow-up tomorrow.    I gave my usual and customary return precautions.      ____________________________________________  FINAL CLINICAL IMPRESSION(S) / ED DIAGNOSES  Final diagnoses:  Complication from renal dialysis device  End stage renal disease ([place])     MEDICATIONS GIVEN DURING THIS VISIT:  Medications - No data to display   NEW OUTPATIENT MEDICATIONS STARTED DURING THIS VISIT:  New Prescriptions   No medications on file      Note:  This document was prepared using Dragon voice recognition software and may include unintentional dictation errors.   Hinda Kehr, MD 07/13/15 [phone]

## 2015-07-14 ENCOUNTER — Encounter: Payer: Self-pay | Admitting: *Deleted

## 2015-07-14 ENCOUNTER — Ambulatory Visit
Admission: RE | Admit: 2015-07-14 | Discharge: 2015-07-14 | Disposition: A | Discharge status: Home / Self Care | Payer: Medicare Other | Source: Ambulatory Visit | Attending: Vascular Surgery | Admitting: Vascular Surgery

## 2015-07-14 ENCOUNTER — Encounter
Admission: RE | Disposition: A | Discharge status: Home / Self Care | Payer: Self-pay | Source: Ambulatory Visit | Attending: Vascular Surgery

## 2015-07-14 DIAGNOSIS — Z794 Long term (current) use of insulin: Secondary | ICD-10-CM | POA: Diagnosis not present

## 2015-07-14 DIAGNOSIS — T82868A Thrombosis of vascular prosthetic devices, implants and grafts, initial encounter: Secondary | ICD-10-CM | POA: Diagnosis present

## 2015-07-14 DIAGNOSIS — N186 End stage renal disease: Secondary | ICD-10-CM | POA: Insufficient documentation

## 2015-07-14 DIAGNOSIS — F17210 Nicotine dependence, cigarettes, uncomplicated: Secondary | ICD-10-CM | POA: Diagnosis not present

## 2015-07-14 DIAGNOSIS — Z8249 Family history of ischemic heart disease and other diseases of the circulatory system: Secondary | ICD-10-CM | POA: Insufficient documentation

## 2015-07-14 DIAGNOSIS — I7090 Unspecified atherosclerosis: Secondary | ICD-10-CM | POA: Diagnosis not present

## 2015-07-14 DIAGNOSIS — E7800 Pure hypercholesterolemia, unspecified: Secondary | ICD-10-CM | POA: Diagnosis not present

## 2015-07-14 DIAGNOSIS — Z833 Family history of diabetes mellitus: Secondary | ICD-10-CM | POA: Diagnosis not present

## 2015-07-14 DIAGNOSIS — Z6841 Body Mass Index (BMI) 40.0 and over, adult: Secondary | ICD-10-CM | POA: Diagnosis not present

## 2015-07-14 DIAGNOSIS — Z992 Dependence on renal dialysis: Secondary | ICD-10-CM | POA: Insufficient documentation

## 2015-07-14 DIAGNOSIS — M79609 Pain in unspecified limb: Secondary | ICD-10-CM | POA: Insufficient documentation

## 2015-07-14 DIAGNOSIS — Y832 Surgical operation with anastomosis, bypass or graft as the cause of abnormal reaction of the patient, or of later complication, without mention of misadventure at the time of the procedure: Secondary | ICD-10-CM | POA: Insufficient documentation

## 2015-07-14 DIAGNOSIS — Z8342 Family history of familial hypercholesterolemia: Secondary | ICD-10-CM | POA: Insufficient documentation

## 2015-07-14 DIAGNOSIS — I120 Hypertensive chronic kidney disease with stage 5 chronic kidney disease or end stage renal disease: Secondary | ICD-10-CM | POA: Diagnosis not present

## 2015-07-14 DIAGNOSIS — Z881 Allergy status to other antibiotic agents status: Secondary | ICD-10-CM | POA: Insufficient documentation

## 2015-07-14 DIAGNOSIS — E1122 Type 2 diabetes mellitus with diabetic chronic kidney disease: Secondary | ICD-10-CM | POA: Diagnosis not present

## 2015-07-14 HISTORY — DX: Bariatric surgery status: Z98.84

## 2015-07-14 HISTORY — PX: PERIPHERAL VASCULAR CATHETERIZATION: SHX172C

## 2015-07-14 SURGERY — THROMBECTOMY
Anesthesia: Moderate Sedation | Site: Arm Upper | Laterality: Right

## 2015-07-14 MED ORDER — FENTANYL CITRATE (PF) 100 MCG/2ML IJ SOLN
INTRAMUSCULAR | Status: AC
  Filled 2015-07-14: qty 2

## 2015-07-14 MED ORDER — MIDAZOLAM HCL 2 MG/2ML IJ SOLN
INTRAMUSCULAR | Status: AC
  Filled 2015-07-14: qty 2

## 2015-07-14 MED ORDER — HEPARIN (PORCINE) IN NACL 2-0.9 UNIT/ML-% IJ SOLN
INTRAMUSCULAR | Status: AC
  Filled 2015-07-14: qty 1000

## 2015-07-14 MED ORDER — ONDANSETRON HCL 4 MG/2ML IJ SOLN: 4.0000 mg | Freq: Four times a day (QID) | INTRAMUSCULAR | Status: DC | PRN

## 2015-07-14 MED ORDER — ALTEPLASE 2 MG IJ SOLR
INTRAMUSCULAR | Status: AC
  Filled 2015-07-14: qty 8

## 2015-07-14 MED ORDER — CLINDAMYCIN PHOSPHATE 300 MG/50ML IV SOLN: 300.0000 mg | Freq: Once | INTRAVENOUS | Status: DC

## 2015-07-14 MED ORDER — HEPARIN SODIUM (PORCINE) 1000 UNIT/ML IJ SOLN
INTRAMUSCULAR | Status: DC | PRN
  Administered 2015-07-14: 4000 [IU] via INTRAVENOUS
  Administered 2015-07-14: 2000 [IU] via INTRAVENOUS

## 2015-07-14 MED ORDER — ALTEPLASE 2 MG IJ SOLR
INTRAMUSCULAR | Status: DC | PRN
  Administered 2015-07-14: 8 mg

## 2015-07-14 MED ORDER — FAMOTIDINE 20 MG PO TABS: 40.0000 mg | ORAL_TABLET | ORAL | Status: DC | PRN

## 2015-07-14 MED ORDER — IOPAMIDOL (ISOVUE-300) INJECTION 61%
INTRAVENOUS | Status: DC | PRN
  Administered 2015-07-14: 50 mL via INTRA_ARTERIAL

## 2015-07-14 MED ORDER — HYDROMORPHONE HCL 1 MG/ML IJ SOLN: 1.0000 mg | Freq: Once | INTRAMUSCULAR | Status: DC

## 2015-07-14 MED ORDER — DIPHENHYDRAMINE HCL 50 MG/ML IJ SOLN
INTRAMUSCULAR | Status: AC
  Filled 2015-07-14: qty 1

## 2015-07-14 MED ORDER — HEPARIN SODIUM (PORCINE) 1000 UNIT/ML IJ SOLN
INTRAMUSCULAR | Status: AC
  Filled 2015-07-14: qty 1

## 2015-07-14 MED ORDER — MIDAZOLAM HCL 5 MG/5ML IJ SOLN
INTRAMUSCULAR | Status: AC
  Filled 2015-07-14: qty 5

## 2015-07-14 MED ORDER — FENTANYL CITRATE (PF) 100 MCG/2ML IJ SOLN
INTRAMUSCULAR | Status: DC | PRN
  Administered 2015-07-14 (×3): 50 ug via INTRAVENOUS
  Administered 2015-07-14: 100 ug via INTRAVENOUS
  Administered 2015-07-14 (×5): 50 ug via INTRAVENOUS

## 2015-07-14 MED ORDER — SODIUM CHLORIDE FLUSH 0.9 % IV SOLN
INTRAVENOUS | Status: AC
  Filled 2015-07-14: qty 50

## 2015-07-14 MED ORDER — LIDOCAINE HCL (PF) 1 % IJ SOLN
INTRAMUSCULAR | Status: DC | PRN
  Administered 2015-07-14: 5 mL via INTRADERMAL

## 2015-07-14 MED ORDER — LIDOCAINE HCL (PF) 1 % IJ SOLN
INTRAMUSCULAR | Status: AC
  Filled 2015-07-14: qty 30

## 2015-07-14 MED ORDER — DIPHENHYDRAMINE HCL 50 MG/ML IJ SOLN
INTRAMUSCULAR | Status: DC | PRN
  Administered 2015-07-14: 50 mg via INTRAVENOUS

## 2015-07-14 MED ORDER — MIDAZOLAM HCL 2 MG/2ML IJ SOLN
INTRAMUSCULAR | Status: DC | PRN
  Administered 2015-07-14: 2 mg via INTRAVENOUS
  Administered 2015-07-14 (×2): 1 mg via INTRAVENOUS
  Administered 2015-07-14: 2 mg via INTRAVENOUS
  Administered 2015-07-14 (×2): 1 mg via INTRAVENOUS
  Administered 2015-07-14: 2 mg via INTRAVENOUS
  Administered 2015-07-14: 1 mg via INTRAVENOUS

## 2015-07-14 MED ORDER — METHYLPREDNISOLONE SODIUM SUCC 125 MG IJ SOLR: 125.0000 mg | INTRAMUSCULAR | Status: DC | PRN

## 2015-07-14 MED ORDER — SODIUM CHLORIDE 0.9 % IV SOLN: INTRAVENOUS | Status: DC

## 2015-07-14 SURGICAL SUPPLY — 25 items
BALLN DORADO 5X40X80 (BALLOONS) ×4
BALLN DORADO 8X100X80 (BALLOONS) ×4
BALLN LUTONIX DCB 4X40X130 (BALLOONS) ×4
BALLN ULTRVRSE 8X40X75C (BALLOONS) ×4
BALLOON DORADO 5X40X80 (BALLOONS) ×2 IMPLANT
BALLOON DORADO 8X100X80 (BALLOONS) ×2 IMPLANT
BALLOON LUTONIX DCB 4X40X130 (BALLOONS) ×2 IMPLANT
BALLOON ULTRVRSE 8X40X75C (BALLOONS) ×2 IMPLANT
CATH TORCON 5FR 0.38 (CATHETERS) ×4 IMPLANT
DEVICE PRESTO INFLATION (MISCELLANEOUS) ×4 IMPLANT
DEVICE TORQUE (MISCELLANEOUS) ×4 IMPLANT
DRAPE BRACHIAL (DRAPES) ×4 IMPLANT
GLIDEWIRE .035X150 LONG TAPER (WIRE) ×4 IMPLANT
GOWN STRL XL  DISP (MISCELLANEOUS) ×8 IMPLANT
KIT THROMB PERC PTD (MISCELLANEOUS) ×4 IMPLANT
PACK ANGIOGRAPHY (CUSTOM PROCEDURE TRAY) ×4 IMPLANT
SET AVX THROMB ULT (MISCELLANEOUS) ×4 IMPLANT
SET INTRO CAPELLA COAXIAL (SET/KITS/TRAYS/PACK) ×4 IMPLANT
SHEATH BRITE TIP 6FRX5.5 (SHEATH) ×8 IMPLANT
SHEATH BRITE TIP 7FRX5.5 (SHEATH) ×4 IMPLANT
STENT VIABAHN 8X100X120 (Permanent Stent) ×2 IMPLANT
STENT VIABAHN 8X10X120 (Permanent Stent) ×2 IMPLANT
TOWEL OR 17X26 4PK STRL BLUE (TOWEL DISPOSABLE) ×4 IMPLANT
WIRE G.018X260 SHT (WIRE) ×4 IMPLANT
WIRE MAGIC TORQUE 260C (WIRE) ×4 IMPLANT

## 2015-07-14 NOTE — Op Note (Signed)
OPERATIVE NOTE   PROCEDURE: 1. Contrast injection right arm brachiocephalic fistula 2. Mechanical thrombectomy using both AngioJet and the Trerotola device right arm brachiocephalic fistula 3. Infusion TPA right arm brachiocephalic fistula 4. Percutaneous transluminal angioplasty and stent placement using an 8 x 100 Viabahn postdilated to 8 mm right brachiocephalic fistula 5. Treatment of the perianastomotic brachial artery with a 4 mm Lutonix balloon.  PRE-OPERATIVE DIAGNOSIS: Complication of dialysis access                                                       End Stage Renal Disease  POST-OPERATIVE DIAGNOSIS: same as above   SURGEON: Katha Cabal, M.D.  ANESTHESIA: Conscious Sedation   ESTIMATED BLOOD LOSS: minimal  FINDING(S): 1. Extensive thrombus with long strictures throughout the access portion.  SPECIMEN(S):  None  CONTRAST: 50 cc  FLUOROSCOPY TIME: 12.7 minutes  INDICATIONS: [name] is a 30 y.o. female who  presents with thrombosed right arm brachiocephalic fistula AV access.  The patient is scheduled for angiography with possible intervention of the AV access.  The patient is aware the risks include but are not limited to: bleeding, infection, thrombosis of the cannulated access, and possible anaphylactic reaction to the contrast.  The patient acknowledges if the access can not be salvaged a tunneled catheter will be needed and will be placed during this procedure.  The patient is aware of the risks of the procedure and elects to proceed with the angiogram and intervention.  DESCRIPTION: After full informed written consent was obtained, the patient was brought back to the Special Procedure suite and placed supine position.  Appropriate cardiopulmonary monitors were placed.  The right arm was prepped and draped in the standard fashion.  Appropriate timeout is called. The right brachiocephalic fistula  was cannulated with a micropuncture needle in a  retrograde fashion at the level of the deltoid muscle.  The microwire was advanced and the needle was exchanged for  a microsheath.  The J-wire was then advanced and a 6 Fr sheath inserted.  Hand was then performed which demonstrated thrombus within the AV access.  The central venous structures were also imaged by hand injections.  Based on the images, a Kumpe catheter was advanced to the level of the anastomosis and a total of 8 mg of TPA reconstituted in 50 cc was then infused throughout the length of the access saving approximately 10-15 cc for the arterial portion after the second sheath had been started. This initial lacing of the access was then allowed to dwell for approximately 38 minutes.  6000 units of heparin was given and allowed to circulate as well (the initial bolus was 4000 units and an additional 2000 units was given later in the case).  After the appropriate dwell time, hand injection contrast demonstrated significant residual thrombus and therefore a AngioJet device was then advanced beginning at the arterial anastomosis and moving toward the central portion.  Several passes were made through the venous portion of the graft. Follow-up imaging now demonstrates the vast majority of the clot had not been treated. Therefore, a floppy Glidewire and a KMP catheter were negotiated into the arterial system hand injection contrast was then utilized to demonstrate patency of the artery as well as the location for the anastomosis. Given the poor result from both TPA which  was allowed to dwell for nearly 40 minutes as well as the AngioJet I elected to stent the fistula essentially completing from the anastomosis to the pre-existing stent. An 8 x 100 Viabahn was selected. It was deployed without difficulty and postdilated to 8 mm with a [place] balloon.  Forward flow was still lacking however selective imaging demonstrates a thrombus was surrounding the retrograde sheath. Therefore microneedle was  inserted into the newly placed stent several centimeters above the anastomosis and a microwire was advanced followed by micro-sheath then a J-wire and a 6 [place] sheath. At this time a Trerotola device was used to clear thrombus from the previously placed stent in the area surrounding the retrograde sheath. This was successful and uncovered a moderate stenosis at the proximal edge of the stent. This was easily treated with an 8 mm x 40 mm balloon inflated to 8 atm. With this inflation reflux of contrast was performed demonstrating wide patency of the arterial anastomosis and fistula. There is now noted to be a 60% stenosis in the brachial artery at the level of the anastomosis. After deflation of the balloon the more proximal portion of the fistula was imaged as well central veins and this was all widely patent now. There is been complete elimination or resolution of the thrombus. Therefore Kumpe catheter and wire were reintroduced through the retrograde sheath and negotiated into the brachial artery and a 4 x 40 Lutonix balloon was used to angioplasty the arterial anastomosis. This inflation was to 8 atm for 1 minute. Follow-up selective imaging through the Kumpe catheter demonstrated resolution of this lesion.   A 4-0 Monocryl purse-string suture was sewn around both of the sheaths.  The sheaths were removed and light pressure was applied.  A sterile bandage was applied to the puncture site.    COMPLICATIONS: None  CONDITION: [name], M.D [facility] Office: [phone]  07/14/2015 3:58 PM

## 2015-07-14 NOTE — Discharge Instructions (Signed)
Fistulogram, Care After °Refer to this sheet in the next few weeks. These instructions provide you with information on caring for yourself after your procedure. Your health care provider may also give you more specific instructions. Your treatment has been planned according to current medical practices, but problems sometimes occur. Call your health care provider if you have any problems or questions after your procedure. °WHAT TO EXPECT AFTER THE PROCEDURE °After your procedure, it is typical to have the following: °· A small amount of discomfort in the area where the catheters were placed. °· A small amount of bruising around the fistula. °· Sleepiness and fatigue. °HOME CARE INSTRUCTIONS °· Rest at home for the day following your procedure. °· Do not drive or operate heavy machinery while taking pain medicine. °· Take medicines only as directed by your health care provider. °· Do not take baths, swim, or use a hot tub until your health care provider approves. You may shower 24 hours after the procedure or as directed by your health care provider. °· There are many different ways to close and cover an incision, including stitches, skin glue, and adhesive strips. Follow your health care provider's instructions on: °¨ Incision care. °¨ Bandage (dressing) changes and removal. °¨ Incision closure removal. °· Monitor your dialysis fistula carefully. °SEEK MEDICAL CARE IF: °· You have drainage, redness, swelling, or pain at your catheter site. °· You have a fever. °· You have chills. °SEEK IMMEDIATE MEDICAL CARE IF: °· You feel weak. °· You have trouble balancing. °· You have trouble moving your arms or legs. °· You have problems with your speech or vision. °· You can no longer feel a vibration or buzz when you put your fingers over your dialysis fistula. °· The limb that was used for the procedure: °¨ Swells. °¨ Is painful. °¨ Is cold. °¨ Is discolored, such as blue or pale white. °  °This information is not intended  to replace advice given to you by your health care provider. Make sure you discuss any questions you have with your health care provider. °  °Document Released: 06/03/2013 Document Reviewed: 06/03/2013 °Elsevier Interactive Patient Education ©2016 [employer]vier Inc. ° °

## 2015-07-15 ENCOUNTER — Encounter: Payer: Self-pay | Admitting: Vascular Surgery

## 2015-07-17 ENCOUNTER — Ambulatory Visit: Payer: Medicare Other | Admitting: Anesthesiology

## 2015-07-17 ENCOUNTER — Ambulatory Visit
Admission: RE | Admit: 2015-07-17 | Discharge: 2015-07-17 | Disposition: A | Discharge status: Home / Self Care | Payer: Medicare Other | Source: Ambulatory Visit | Attending: Vascular Surgery | Admitting: Vascular Surgery

## 2015-07-17 ENCOUNTER — Encounter
Admission: RE | Disposition: A | Discharge status: Home / Self Care | Payer: Self-pay | Source: Ambulatory Visit | Attending: Vascular Surgery

## 2015-07-17 DIAGNOSIS — F17210 Nicotine dependence, cigarettes, uncomplicated: Secondary | ICD-10-CM | POA: Diagnosis not present

## 2015-07-17 DIAGNOSIS — N186 End stage renal disease: Secondary | ICD-10-CM | POA: Insufficient documentation

## 2015-07-17 DIAGNOSIS — I120 Hypertensive chronic kidney disease with stage 5 chronic kidney disease or end stage renal disease: Secondary | ICD-10-CM | POA: Diagnosis not present

## 2015-07-17 DIAGNOSIS — Y832 Surgical operation with anastomosis, bypass or graft as the cause of abnormal reaction of the patient, or of later complication, without mention of misadventure at the time of the procedure: Secondary | ICD-10-CM | POA: Insufficient documentation

## 2015-07-17 DIAGNOSIS — Z992 Dependence on renal dialysis: Secondary | ICD-10-CM | POA: Diagnosis not present

## 2015-07-17 DIAGNOSIS — Z91040 Latex allergy status: Secondary | ICD-10-CM | POA: Diagnosis not present

## 2015-07-17 DIAGNOSIS — Z6841 Body Mass Index (BMI) 40.0 and over, adult: Secondary | ICD-10-CM | POA: Insufficient documentation

## 2015-07-17 DIAGNOSIS — E1122 Type 2 diabetes mellitus with diabetic chronic kidney disease: Secondary | ICD-10-CM | POA: Insufficient documentation

## 2015-07-17 DIAGNOSIS — Z880 Allergy status to penicillin: Secondary | ICD-10-CM | POA: Insufficient documentation

## 2015-07-17 DIAGNOSIS — Z881 Allergy status to other antibiotic agents status: Secondary | ICD-10-CM | POA: Insufficient documentation

## 2015-07-17 DIAGNOSIS — T82868A Thrombosis of vascular prosthetic devices, implants and grafts, initial encounter: Secondary | ICD-10-CM | POA: Diagnosis present

## 2015-07-17 DIAGNOSIS — J45909 Unspecified asthma, uncomplicated: Secondary | ICD-10-CM | POA: Diagnosis not present

## 2015-07-17 HISTORY — PX: PERIPHERAL VASCULAR CATHETERIZATION: SHX172C

## 2015-07-17 LAB — PREGNANCY, URINE: Preg Test, Ur: NEGATIVE

## 2015-07-17 LAB — POTASSIUM (ARMC VASCULAR LAB ONLY): POTASSIUM (ARMC VASCULAR LAB): 5 (ref 3.5–5.1)

## 2015-07-17 SURGERY — THROMBECTOMY
Anesthesia: General

## 2015-07-17 MED ORDER — HEPARIN (PORCINE) IN NACL 2-0.9 UNIT/ML-% IJ SOLN
INTRAMUSCULAR | Status: AC
  Filled 2015-07-17: qty 1000

## 2015-07-17 MED ORDER — LIDOCAINE HCL (CARDIAC) 20 MG/ML IV SOLN
INTRAVENOUS | Status: DC | PRN
  Administered 2015-07-17: 45 mg via INTRAVENOUS

## 2015-07-17 MED ORDER — FENTANYL CITRATE (PF) 100 MCG/2ML IJ SOLN
25.0000 ug | INTRAMUSCULAR | Status: DC | PRN
  Administered 2015-07-17 (×2): 25 ug via INTRAVENOUS

## 2015-07-17 MED ORDER — MIDAZOLAM HCL 5 MG/5ML IJ SOLN
INTRAMUSCULAR | Status: DC | PRN
  Administered 2015-07-17: 2 mg via INTRAVENOUS

## 2015-07-17 MED ORDER — ONDANSETRON HCL 4 MG/2ML IJ SOLN
4.0000 mg | Freq: Once | INTRAMUSCULAR | Status: DC | PRN
Start: 2015-07-17 — End: 2015-07-17

## 2015-07-17 MED ORDER — ROCURONIUM BROMIDE 100 MG/10ML IV SOLN
INTRAVENOUS | Status: DC | PRN
  Administered 2015-07-17 (×2): 10 mg via INTRAVENOUS

## 2015-07-17 MED ORDER — PROPOFOL 10 MG/ML IV BOLUS
INTRAVENOUS | Status: DC | PRN
  Administered 2015-07-17: 140 mg via INTRAVENOUS

## 2015-07-17 MED ORDER — PHENYLEPHRINE HCL 10 MG/ML IJ SOLN
INTRAMUSCULAR | Status: DC | PRN
  Administered 2015-07-17: 50 ug via INTRAVENOUS

## 2015-07-17 MED ORDER — HEPARIN SODIUM (PORCINE) 10000 UNIT/ML IJ SOLN
INTRAMUSCULAR | Status: AC
  Filled 2015-07-17: qty 1

## 2015-07-17 MED ORDER — SODIUM CHLORIDE 0.9 % IV SOLN
INTRAVENOUS | Status: DC
  Administered 2015-07-17: 12:00:00 via INTRAVENOUS

## 2015-07-17 MED ORDER — ONDANSETRON HCL 4 MG/2ML IJ SOLN
INTRAMUSCULAR | Status: DC | PRN
  Administered 2015-07-17: 4 mg via INTRAVENOUS

## 2015-07-17 MED ORDER — NEOSTIGMINE METHYLSULFATE 10 MG/10ML IV SOLN
INTRAVENOUS | Status: DC | PRN
  Administered 2015-07-17: 3 mg via INTRAVENOUS

## 2015-07-17 MED ORDER — FENTANYL CITRATE (PF) 100 MCG/2ML IJ SOLN
INTRAMUSCULAR | Status: DC | PRN
  Administered 2015-07-17: 100 ug via INTRAVENOUS

## 2015-07-17 MED ORDER — SUCCINYLCHOLINE CHLORIDE 20 MG/ML IJ SOLN
INTRAMUSCULAR | Status: DC | PRN
  Administered 2015-07-17: 80 mg via INTRAVENOUS

## 2015-07-17 MED ORDER — LIDOCAINE HCL (PF) 1 % IJ SOLN
INTRAMUSCULAR | Status: AC
  Filled 2015-07-17: qty 30

## 2015-07-17 MED ORDER — GLYCOPYRROLATE 0.2 MG/ML IJ SOLN
INTRAMUSCULAR | Status: DC | PRN
  Administered 2015-07-17: 0.6 mg via INTRAVENOUS

## 2015-07-17 MED ORDER — LIDOCAINE HCL (PF) 1 % IJ SOLN
INTRAMUSCULAR | Status: DC | PRN
  Administered 2015-07-17: 10 mL

## 2015-07-17 SURGICAL SUPPLY — 10 items
CATH PALINDROME RT-P 15FX23CM (CATHETERS) IMPLANT
CATH PALINDROME-P 19CM W/VT (CATHETERS) ×3 IMPLANT
DERMABOND ADVANCED (GAUZE/BANDAGES/DRESSINGS) ×2
DERMABOND ADVANCED .7 DNX12 (GAUZE/BANDAGES/DRESSINGS) ×1 IMPLANT
DRAPE BRACHIAL (DRAPES) ×3 IMPLANT
GOWN STRL XL  DISP (MISCELLANEOUS) ×3 IMPLANT
PACK ANGIOGRAPHY (CUSTOM PROCEDURE TRAY) ×3 IMPLANT
SET INTRO CAPELLA COAXIAL (SET/KITS/TRAYS/PACK) ×3 IMPLANT
SHEATH BRITE TIP 6FRX5.5 (SHEATH) ×3 IMPLANT
TOWEL OR 17X26 4PK STRL BLUE (TOWEL DISPOSABLE) ×3 IMPLANT

## 2015-07-17 NOTE — Anesthesia Procedure Notes (Signed)
Procedure Name: Intubation Date/Time: 07/17/2015 12:46 PM Performed by: Dionne Bucy Pre-anesthesia Checklist: Patient identified, Patient being monitored, Timeout performed, Emergency Drugs available and Suction available Patient Re-evaluated:Patient Re-evaluated prior to inductionOxygen Delivery Method: Circle system utilized Preoxygenation: Pre-oxygenation with 100% oxygen Intubation Type: IV induction Ventilation: Mask ventilation without difficulty Laryngoscope Size: Mac and 3 Grade View: Grade I Tube type: Oral Tube size: 7.0 mm Number of attempts: 1 Airway Equipment and Method: Stylet Placement Confirmation: ETT inserted through vocal cords under direct vision,  positive ETCO2 and breath sounds checked- equal and bilateral Secured at: 21 cm Tube secured with: Tape Dental Injury: Teeth and Oropharynx as per pre-operative assessment

## 2015-07-17 NOTE — Anesthesia Postprocedure Evaluation (Signed)
Anesthesia Post Note  Patient: [name]  Procedure(s) Performed: Procedure(s) (LRB): Thrombectomy (N/A) Dialysis/Perma Catheter Insertion (N/A)  Patient location during evaluation: PACU Anesthesia Type: General Level of consciousness: awake and alert Pain management: pain level controlled Vital Signs Assessment: post-procedure vital signs reviewed and stable Respiratory status: spontaneous breathing, nonlabored ventilation, respiratory function stable and patient connected to nasal cannula oxygen Cardiovascular status: blood pressure returned to baseline and stable Postop Assessment: no signs of nausea or vomiting Anesthetic complications: no    Last Vitals:  Filed Vitals:   07/17/15 1456 07/17/15 1511  BP: 113/74 114/74  Pulse: 79 76  Temp:  36.7 C  Resp: 15 15    Last Pain:  Filed Vitals:   07/17/15 1514  PainSc: [place]

## 2015-07-17 NOTE — Anesthesia Preprocedure Evaluation (Addendum)
Anesthesia Evaluation  Patient identified by MRN, date of birth, ID band Patient awake    Reviewed: Allergy & Precautions, NPO status , Patient's Chart, lab work & pertinent test results, reviewed documented beta blocker date and time   Airway Mallampati: III  TM Distance: >3 FB     Dental  (+) Chipped   Pulmonary asthma , sleep apnea and Continuous Positive Airway Pressure Ventilation , Current Smoker,           Cardiovascular hypertension, Pt. on medications and Pt. on home beta blockers      Neuro/Psych    GI/Hepatic   Endo/Other  diabetes  Renal/GU ESRFRenal disease     Musculoskeletal   Abdominal   Peds  Hematology   Anesthesia Other Findings Had seizure 6 yrs ago -stress? Gastric sleeve. No meds for asthma. Hb 10. EKG OK, poor R wave prog. Uses CPAP - will use tonite. K 5.0.  Reproductive/Obstetrics                           Anesthesia Physical Anesthesia Plan  ASA: III  Anesthesia Plan: General   Post-op Pain Management:    Induction: Intravenous  Airway Management Planned: LMA  Additional Equipment:   Intra-op Plan:   Post-operative Plan:   Informed Consent: I have reviewed the patients History and Physical, chart, labs and discussed the procedure including the risks, benefits and alternatives for the proposed anesthesia with the patient or authorized representative who has indicated his/her understanding and acceptance.     Plan Discussed with: CRNA  Anesthesia Plan Comments:         Anesthesia Quick Evaluation

## 2015-07-17 NOTE — Op Note (Signed)
OPERATIVE NOTE   PROCEDURE: 1. Insertion of tunneled dialysis catheter right internal jugular approach with ultrasound and fluoroscopic guidance. 2. Contrast injection right arm brachiocephalic fistula  PRE-OPERATIVE DIAGNOSIS: Combination of dialysis device with difficulty cannulating right arm AV fistula; end-stage renal disease requiring hemodialysis  POST-OPERATIVE DIAGNOSIS: Same; thrombosis right arm brachiocephalic fistula  SURGEON: Caedan Sumler, Dolores Lory.  ANESTHESIA: Gen. by LMA  ESTIMATED BLOOD LOSS: Minimal cc  CONTRAST USED:  None  FLUOROSCOPY TIME:  1.0 minutes  INDICATIONS:   [name] a 30 y.o. y.o. female who presents with problems with her AV access and inability to undergo her dialysis. The plan will be to evaluate the AV access and should it be thrombosed then I do not believe it is salvageable given she was just declotted 48 hours ago.  I will plan to place a catheter and then begin workup for an entirely new access.  DESCRIPTION: After obtaining full informed written consent, the patient was positioned supine. The right arm and right neck was prepped and draped in a sterile fashion. Ultrasound was placed in a sterile sleeve. The microneedle was then inserted under direct ultrasound visualization into the brachial cephalic fistula. Microwire was then advanced followed by micro-sheath. Stopcock was placed and the sheath was flushed with heparinized saline. The detector was positioned over the access and hand injection contrast was performed which demonstrated thrombus throughout the entire fistula including both the stented segment as well as the vein extending up to the level of the deltoid. As noted above she was recently declotted and has not even had a successful dialysis run since that intervention therefore I do not believe this access is salvageable. And I will now proceed with tunneled catheter.  Ultrasound was utilized to identify the right internal  jugular vein which is noted to be echolucent and compressible indicating patency. Images recorded for the permanent record. Under real-time visualization a Seldinger needle is inserted into the vein and the guidewires advanced without difficulty. Small counterincision was made at the wire insertion site. Dilators are passed over the wire and the tunneled dialysis catheter is fed into the central venous system without difficulty. The 23 cm tip to cuff catheter is too long and therefore a antegrade catheter 19 cm tipped cuff is opened onto the field. The 23 cm tip to cuff catheter is then pulled through the subcutaneous tunnel and rewired. The catheter is then removed and a 19 cm tip to cuff antegrade palindrome catheter is advanced over the wire and positioned with its tip at the atrial caval junction.  As noted above under fluoroscopy the catheter tip positioned at the atrial caval junction.  Both lumens aspirate and flush easily. After verification of smooth contour with proper tip position under fluoroscopy the catheter is packed with 5000 units of heparin per lumen.  Catheter secured to the skin of the right chest wall with 0 silk. A 4-0 Monocryl was used to close the counterincision and Dermabond is applied.   A sterile dressing is applied with a Biopatch.  COMPLICATIONS: None  CONDITION: Good  Macaila Tahir, Dolores Lory  renovascular. Office:  [phone]   07/17/2015,1:53 PM

## 2015-07-17 NOTE — Discharge Instructions (Signed)
Fistulogram, Care After Refer to this sheet in the next few weeks. These instructions provide you with information on caring for yourself after your procedure. Your health care provider may also give you more specific instructions. Your treatment has been planned according to current medical practices, but problems sometimes occur. Call your health care provider if you have any problems or questions after your procedure. WHAT TO EXPECT AFTER THE PROCEDURE After your procedure, it is typical to have the following:  A small amount of discomfort in the area where the catheters were placed.  A small amount of bruising around the fistula.  Sleepiness and fatigue. HOME CARE INSTRUCTIONS  Rest at home for the day following your procedure.  Do not drive or operate heavy machinery while taking pain medicine.  Take medicines only as directed by your health care provider.  Do not take baths, swim, or use a hot tub until your health care provider approves. You may shower 24 hours after the procedure or as directed by your health care provider.  There are many different ways to close and cover an incision, including stitches, skin glue, and adhesive strips. Follow your health care provider's instructions on:  Incision care.  Bandage (dressing) changes and removal.  Incision closure removal.  Monitor your dialysis fistula carefully. SEEK MEDICAL CARE IF:  You have drainage, redness, swelling, or pain at your catheter site.  You have a fever.  You have chills. SEEK IMMEDIATE MEDICAL CARE IF:  You feel weak.  You have trouble balancing.  You have trouble moving your arms or legs.  You have problems with your speech or vision.  You can no longer feel a vibration or buzz when you put your fingers over your dialysis fistula.  The limb that was used for the procedure:  Swells.  Is painful.  Is cold.  Is discolored, such as blue or pale white.   This information is not intended  to replace advice given to you by your health care provider. Make sure you discuss any questions you have with your health care provider.   Document Released: 06/03/2013 Document Reviewed: 06/03/2013 Elsevier Interactive Patient Education 2016 [place] After Refer to this sheet in the next few weeks. These instructions provide you with information on caring for yourself after your procedure. Your caregiver may also give you more specific instructions. Your treatment has been planned according to current medical practices, but problems sometimes occur. Call your caregiver if you have any problems or questions after your procedure.  HOME CARE INSTRUCTIONS  Rest at home the day of the procedure. You will likely be able to return to normal activities the following day.  Follow your caregiver's specific instructions for the type of device that you have.  Only take over-the-counter or prescription medicines as directed by your caregiver.  Keep the insertion site of the catheter clean and dry at all times.  Change the bandages (dressings) over the catheter site as directed by your caregiver.  Wash the area around the catheter site during each dressing change. Sponge bathe the area using a germ-killing (antiseptic) solution as directed by your caregiver.  Look for redness or swelling at the insertion site during each dressing change.  Apply an antibiotic ointment as directed by your caregiver.  Flush your catheter as directed to keep it from becoming clogged.  Always wash your hands thoroughly before changing dressings or flushing the catheter.  Do notlet air enter the catheter.  Never open the cap at  the catheter tip.  Always make sure there is no air in the syringe or in the tubing for infusions.   Do notlift anything heavy.  Do not drive until your caregiver approves.  Do not shower or bathe until your caregiver approves. When you shower or bathe, place a piece of  plastic wrap over the catheter site. Do not allow the catheter site or the dressing to get wet. If taking a bath, do not allow the catheter to get submerged in the water. If the catheter was inserted through an arm vein:  Avoid wearing tight clothes or jewelry on the arm that has the catheter.   Do not sleep with your head on the arm that has the catheter.   Do not allow use of a blood pressure cuff on the arm that has the catheter.   Do not let anyone draw blood from the arm that has the catheter, except through the catheter itself. SEEK MEDICAL CARE IF:  You have bleeding at the insertion site of the catheter.   You feel weak or nauseous.   Your catheter is not working properly.   You have redness, pain, swelling, and warmth at the insertion site.   You notice fluid draining from the insertion site.  SEEK IMMEDIATE MEDICAL CARE IF:  Your catheter breaks or has a hole in it.   Your catheter comes loose or gets pulled completely out. If this happens, hold firm pressure over the area with your hand or a clean cloth.   You have a fever.  You have chills.   Your catheter becomes totally blocked.   You have swelling in your arm, shoulder, neck, or face.   You have bleeding from the insertion site that does not stop.   You develop chest pain or have trouble breathing.   You feel dizzy or faint.  MAKE SURE YOU:  Understand these instructions.  Will watch your condition.  Will get help right away if you are not doing well or get worse.   This information is not intended to replace advice given to you by your health care provider. Make sure you discuss any questions you have with your health care provider.   Document Released: 01/04/2012 Document Revised: 09/19/2012 Document Reviewed: 01/04/2012 Elsevier Interactive Patient Education [employer].

## 2015-07-17 NOTE — H&P (Signed)
[facility] Admission History & Physical  MRN : [id number]  [name] is a 30 y.o. (12/22/1985) female who presents with chief complaint of problems with dialysis access.  History of Present Illness: The patient is sent by her dialysis center because they were unable to access her AV graft. She recently underwent thrombectomy with stenting. She presented to dialysis yesterday but they were unwilling to proceed and referred her back stating it was too difficult to cannulate.  Patient denies hand pain. No fever or chills. She is now missed 2 dialysis runs. She denies shortness of breath  Current Facility-Administered Medications  Medication Dose Route Frequency Provider Last Rate Last Dose  . 0.9 %  sodium chloride infusion   Intravenous Continuous Katha Cabal, MD 10 mL/hr at 07/17/15 1149    . fentaNYL (SUBLIMAZE) injection 25 mcg  25 mcg Intravenous Q5 min PRN Gunnar Bulla, MD      . ondansetron [facility]) injection 4 mg  4 mg Intravenous Once PRN Gunnar Bulla, MD        Past Medical History  Diagnosis Date  . Diabetes mellitus   . Hypertension   . Renal insufficiency   . Morbid obesity ([place])   . Asthma   . Irregular heart beat   . 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 Eye surgery      laser photocoagulation  . Av fistula placement  08/26/10    Left Radiocephalic AVF  . Peripheral vascular catheterization N/A 07/07/2014    Procedure: A/V Shuntogram/Fistulagram;  Surgeon: Algernon Huxley, MD;  Location: [place] [facility];  Service: Cardiovascular;  Laterality: N/A;  . Peripheral vascular catheterization N/A 07/07/2014    Procedure: A/V Shunt Intervention;  Surgeon: Algernon Huxley, MD;  Location: [place] [facility];  Service: Cardiovascular;  Laterality: N/A;  . Intrauterine device (iud) insertion    . Peripheral vascular catheterization Right 01/12/2015    Procedure: A/V  Shuntogram/Fistulagram;  Surgeon: Algernon Huxley, MD;  Location: [place] [facility];  Service: Cardiovascular;  Laterality: Right;  . Peripheral vascular catheterization N/A 01/12/2015    Procedure: A/V Shunt Intervention;  Surgeon: Algernon Huxley, MD;  Location: [place] [facility];  Service: Cardiovascular;  Laterality: N/A;  . Peripheral vascular catheterization Right 06/08/2015    Procedure: A/V Shuntogram/Fistulagram;  Surgeon: Algernon Huxley, MD;  Location: [place] [facility];  Service: Cardiovascular;  Laterality: Right;  . Peripheral vascular catheterization N/A 06/08/2015    Procedure: A/V Shunt Intervention;  Surgeon: Algernon Huxley, MD;  Location: [place] [facility];  Service: Cardiovascular;  Laterality: N/A;  . Peripheral vascular catheterization Right 07/14/2015    Procedure: Thrombectomy;  Surgeon: Katha Cabal, MD;  Location: [place] [facility];  Service: Cardiovascular;  Laterality: Right;  . Peripheral vascular catheterization N/A 07/14/2015    Procedure: A/V Shuntogram/Fistulagram;  Surgeon: Katha Cabal, MD;  Location: [place] [facility];  Service: Cardiovascular;  Laterality: N/A;    Social History Social History  Substance Use Topics  . Smoking status: Current Every Day Smoker -- 0.00 packs/day for 10 years    Types: Cigarettes  . Smokeless tobacco: None  . Alcohol Use: No    Family History History reviewed. No pertinent family history. No family history bleeding clotting disorders, porphyria or autoimmune disease.  Allergies  Allergen Reactions  . Vancomycin Itching  . Latex Swelling and Rash  .  Marcillin [Ampicillin] Rash     REVIEW OF SYSTEMS (Negative unless checked)  Constitutional: [] Weight loss  [] Fever  [] Chills Cardiac: [] Chest pain   [] Chest pressure   [] Palpitations   [] Shortness of breath when laying flat   [] Shortness of breath at rest   [] Shortness of breath with exertion. Vascular:  [] Pain in legs with walking   [] Pain in legs at rest    [] Pain in legs when laying flat   [] Claudication   [] Pain in feet when walking  [] Pain in feet at rest  [] Pain in feet when laying flat   [] History of DVT   [] Phlebitis   [] Swelling in legs   [] Varicose veins   [] Non-healing ulcers Pulmonary:   [] Uses home oxygen   [] Productive cough   [] Hemoptysis   [] Wheeze  [] COPD   [] Asthma Neurologic:  [] Dizziness  [] Blackouts   [] Seizures   [] History of stroke   [] History of TIA  [] Aphasia   [] Temporary blindness   [] Dysphagia   [] Weakness or numbness in arms   [] Weakness or numbness in legs Musculoskeletal:  [] Arthritis   [] Joint swelling   [] Joint pain   [] Low back pain Hematologic:  [] Easy bruising  [] Easy bleeding   [] Hypercoagulable state   [] Anemic  [] Hepatitis Gastrointestinal:  [] Blood in stool   [] Vomiting blood  [] Gastroesophageal reflux/heartburn   [] Difficulty swallowing. Genitourinary:  [] Chronic kidney disease   [] Difficult urination  [] Frequent urination  [] Burning with urination   [] Blood in urine Skin:  [] Rashes   [] Ulcers   [] Wounds Psychological:  [] History of anxiety   []  History of major depression.  Physical Examination  Filed Vitals:   07/17/15 1124  Pulse: 98  Temp: 98 F (36.7 C)  TempSrc: Oral  Resp: 15  Height: 5\' 5"  (1.651 m)  Weight: 116.121 kg (256 lb)  SpO2: 100%   Body mass index is 42.6 kg/(m^2). Gen: WD/WN, NAD Head: [place]/AT, No temporalis wasting. Prominent temp pulse not noted. Ear/Nose/Throat: Hearing grossly intact, nares w/o erythema or drainage, oropharynx w/o Erythema/Exudate,  Eyes: PERRLA, EOMI.  Neck: Supple, no nuchal rigidity.  No bruit or JVD.  Pulmonary:  Good air movement, clear to auscultation bilaterally, no use of accessory muscles.  Cardiac: RRR, normal S1, S2, no Murmurs, rubs or gallops. Vascular: A/V fistula right arm no thrill consistent with recently placed Viabahn stent Vessel Right Left  Radial Palpable Palpable  Ulnar Not Palpable Not Palpable  Brachial Palpable Palpable    Gastrointestinal: soft, non-tender/non-distended. No guarding/reflex.  Musculoskeletal: M/S 5/5 throughout.  Extremities without ischemic changes.  No deformity or atrophy.  Neurologic: CN 2-12 intact. Pain and light touch intact in extremities.  Symmetrical.  Speech is fluent. Motor exam as listed above. Psychiatric: Judgment intact, Mood & affect appropriate for pt's clinical situation. Dermatologic: No rashes or ulcers noted.  No cellulitis or open wounds. Lymph : No Cervical, Axillary, or Inguinal lymphadenopathy.   CBC Lab Results  Component Value Date   WBC 7.0 07/13/2015   HGB 10.0* 07/13/2015   HCT 30.8* 07/13/2015   MCV 86.9 07/13/2015   PLT 228 07/13/2015    BMET    Component Value Date/Time   NA 138 07/13/2015 1126   NA 132* 02/03/2014 1007   K 3.7 07/13/2015 1126   K 4.7 02/03/2014 1007   CL 96* 07/13/2015 1126   CL 93* 02/03/2014 1007   CO2 28 07/13/2015 1126   CO2 25 02/03/2014 1007   GLUCOSE 119* 07/13/2015 1126   GLUCOSE 258* 02/03/2014 1007   BUN  29* 07/13/2015 1126   BUN 58* 02/03/2014 1007   CREATININE 13.92* 07/13/2015 1126   CREATININE 11.14* 02/03/2014 1007   CALCIUM 8.8* 07/13/2015 1126   CALCIUM 8.4* 02/03/2014 1007   CALCIUM 8.1* 08/23/2010 1000   GFRNONAA 3* 07/13/2015 1126   GFRNONAA 4* 02/03/2014 1007   GFRNONAA 5* 07/19/2013 0416   GFRAA 4* 07/13/2015 1126   GFRAA 5* 02/03/2014 1007   GFRAA 6* 07/19/2013 0416   Estimated Creatinine Clearance: 7.4 mL/min (by C-G formula based on Cr of 13.92).  COAG Lab Results  Component Value Date   INR 1.1 04/08/2013   INR 0.9 03/23/2011   INR 1.22 08/20/2010    Assessment/Plan 1.  Complication dialysis device with inability to cannulate the right AV access:  Patient's right arm dialysis access is difficult to cannulate. The patient will undergo angiography using interventional techniques. If the AV graft is confirmed to be widely patent that a tunneled dialysis catheter will be placed. If the  graft is found to be thrombosed preparations will be made for revision in the OR. Potassium will be drawn to ensure that it is an appropriate level prior to performing thrombectomy. 2.  End-stage renal disease requiring hemodialysis:  Patient will continue dialysis therapy without further interruption if a successful thrombectomy is not achieved then catheter will be placed. Dialysis has already been arranged since the patient missed their previous session 3.  Hypertension:  Patient will continue medical management; nephrology is following no changes in oral medications. 4. Diabetes mellitus:  Glucose will be monitored and oral medications been held this morning once the patient has undergone the patient's procedure po intake will be reinitiated and again Accu-Cheks will be used to assess the blood glucose level and treat as needed. The patient will be restarted on the patient's usual hypoglycemic regime     Ladon Heney, Dolores Lory, MD  07/17/2015 12:35 PM

## 2015-07-17 NOTE — Transfer of Care (Signed)
Immediate Anesthesia Transfer of Care Note  Patient: [name]  Procedure(s) Performed: Procedure(s): Thrombectomy (N/A) Dialysis/Perma Catheter Insertion (N/A)  Patient Location: PACU  Anesthesia Type:General  Level of Consciousness: awake and patient cooperative  Airway & Oxygen Therapy: Patient Spontanous Breathing and Patient connected to face mask oxygen  Post-op Assessment: Report given to RN  Post vital signs: Reviewed and stable  Last Vitals:  Filed Vitals:   07/17/15 1124 07/17/15 1411  BP:  147/91  Pulse: 98 28  Temp: 36.7 C 37 C  Resp: 15 13    Last Pain:  Filed Vitals:   07/17/15 1414  PainSc: 7          Complications: No apparent anesthesia complications

## 2015-07-17 NOTE — H&P (Signed)
[facility] Admission History & Physical  MRN : [id number]  [name] is a 30 y.o. (06/09/1985) female who presents with chief complaint of clotted dialysis access.  History of Present Illness: The patient is sent by her dialysis center secondary to a thrombosed right upper arm dialysis access. She has missed one dialysis run at this point. She denies shortness of breath.  Patient denies right hand pain. No fever or chills.  Current Facility-Administered Medications  Medication Dose Route Frequency Provider Last Rate Last Dose  . 0.9 %  sodium chloride infusion   Intravenous Continuous Katha Cabal, MD 10 mL/hr at 07/17/15 1149    . fentaNYL (SUBLIMAZE) injection 25 mcg  25 mcg Intravenous Q5 min PRN Gunnar Bulla, MD      . ondansetron [facility]) injection 4 mg  4 mg Intravenous Once PRN Gunnar Bulla, MD        Past Medical History  Diagnosis Date  . Diabetes mellitus   . Hypertension   . Renal insufficiency   . Morbid obesity ([place])   . Asthma   . Irregular heart beat   . 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 Eye surgery      laser photocoagulation  . Av fistula placement  08/26/10    Left Radiocephalic AVF  . Peripheral vascular catheterization N/A 07/07/2014    Procedure: A/V Shuntogram/Fistulagram;  Surgeon: Algernon Huxley, MD;  Location: [place] [facility];  Service: Cardiovascular;  Laterality: N/A;  . Peripheral vascular catheterization N/A 07/07/2014    Procedure: A/V Shunt Intervention;  Surgeon: Algernon Huxley, MD;  Location: [place] [facility];  Service: Cardiovascular;  Laterality: N/A;  . Intrauterine device (iud) insertion    . Peripheral vascular catheterization Right 01/12/2015    Procedure: A/V Shuntogram/Fistulagram;  Surgeon: Algernon Huxley, MD;  Location: [place] [facility];  Service: Cardiovascular;  Laterality: Right;  . Peripheral vascular catheterization N/A  01/12/2015    Procedure: A/V Shunt Intervention;  Surgeon: Algernon Huxley, MD;  Location: [place] [facility];  Service: Cardiovascular;  Laterality: N/A;  . Peripheral vascular catheterization Right 06/08/2015    Procedure: A/V Shuntogram/Fistulagram;  Surgeon: Algernon Huxley, MD;  Location: [place] [facility];  Service: Cardiovascular;  Laterality: Right;  . Peripheral vascular catheterization N/A 06/08/2015    Procedure: A/V Shunt Intervention;  Surgeon: Algernon Huxley, MD;  Location: [place] [facility];  Service: Cardiovascular;  Laterality: N/A;  . Peripheral vascular catheterization Right 07/14/2015    Procedure: Thrombectomy;  Surgeon: Katha Cabal, MD;  Location: [place] [facility];  Service: Cardiovascular;  Laterality: Right;  . Peripheral vascular catheterization N/A 07/14/2015    Procedure: A/V Shuntogram/Fistulagram;  Surgeon: Katha Cabal, MD;  Location: [place] [facility];  Service: Cardiovascular;  Laterality: N/A;    Social History Social History  Substance Use Topics  . Smoking status: Current Every Day Smoker -- 0.00 packs/day for 10 years    Types: Cigarettes  . Smokeless tobacco: None  . Alcohol Use: No    Family History History reviewed. No pertinent family history. No family history of bleeding clotting disorders, porphyria or autoimmune disease  Allergies  Allergen Reactions  . Vancomycin Itching  . Latex Swelling and Rash  . Marcillin [Ampicillin] Rash     REVIEW OF SYSTEMS (Negative unless checked)  Constitutional: [] Weight loss  [] Fever  [] Chills Cardiac: [] Chest  pain   [] Chest pressure   [] Palpitations   [] Shortness of breath when laying flat   [] Shortness of breath at rest   [] Shortness of breath with exertion. Vascular:  [] Pain in legs with walking   [] Pain in legs at rest   [] Pain in legs when laying flat   [] Claudication   [] Pain in feet when walking  [] Pain in feet at rest  [] Pain in feet when laying flat   [] History of DVT   [] Phlebitis    [] Swelling in legs   [] Varicose veins   [] Non-healing ulcers Pulmonary:   [] Uses home oxygen   [] Productive cough   [] Hemoptysis   [] Wheeze  [] COPD   [] Asthma Neurologic:  [] Dizziness  [] Blackouts   [] Seizures   [] History of stroke   [] History of TIA  [] Aphasia   [] Temporary blindness   [] Dysphagia   [] Weakness or numbness in arms   [] Weakness or numbness in legs Musculoskeletal:  [] Arthritis   [] Joint swelling   [] Joint pain   [] Low back pain Hematologic:  [] Easy bruising  [] Easy bleeding   [] Hypercoagulable state   [] Anemic  [] Hepatitis Gastrointestinal:  [] Blood in stool   [] Vomiting blood  [] Gastroesophageal reflux/heartburn   [] Difficulty swallowing. Genitourinary:  [] Chronic kidney disease   [] Difficult urination  [] Frequent urination  [] Burning with urination   [] Blood in urine Skin:  [] Rashes   [] Ulcers   [] Wounds Psychological:  [] History of anxiety   []  History of major depression.  Physical Examination  Filed Vitals:   07/17/15 1124  Pulse: 98  Temp: 98 F (36.7 C)  TempSrc: Oral  Resp: 15  Height: 5\' 5"  (1.651 m)  Weight: 116.121 kg (256 lb)  SpO2: 100%   Body mass index is 42.6 kg/(m^2). Gen: WD/WN, NAD Head: [place]/AT, No temporalis wasting. Prominent temp pulse not noted. Ear/Nose/Throat: Hearing grossly intact, nares w/o erythema or drainage, oropharynx w/o Erythema/Exudate,  Eyes: PERRLA, EOMI.  Neck: Supple, no nuchal rigidity.  No bruit or JVD.  Pulmonary:  Good air movement, clear to auscultation bilaterally, no use of accessory muscles.  Cardiac: RRR, normal S1, S2, no Murmurs, rubs or gallops. Vascular: Right arm brachiocephalic fistula no thrill no bruit large aneurysmal segment with skin deterioration and early breakdown noted. Vessel Right Left  Radial Palpable Palpable  Ulnar Not Palpable Not Palpable  Brachial Palpable Palpable  Carotid Palpable, without bruit Palpable, without bruit   Gastrointestinal: soft, non-tender/non-distended. No guarding/reflex.   Musculoskeletal: M/S 5/5 throughout.  Extremities without ischemic changes.  No deformity or atrophy.  Neurologic: CN 2-12 intact. Pain and light touch intact in extremities.  Symmetrical.  Speech is fluent. Motor exam as listed above. Psychiatric: Judgment intact, Mood & affect appropriate for pt's clinical situation. Dermatologic: No rashes or ulcers noted.  No cellulitis or open wounds. Lymph : No Cervical, Axillary, or Inguinal lymphadenopathy.  CBC Lab Results  Component Value Date   WBC 7.0 07/13/2015   HGB 10.0* 07/13/2015   HCT 30.8* 07/13/2015   MCV 86.9 07/13/2015   PLT 228 07/13/2015    BMET    Component Value Date/Time   NA 138 07/13/2015 1126   NA 132* 02/03/2014 1007   K 3.7 07/13/2015 1126   K 4.7 02/03/2014 1007   CL 96* 07/13/2015 1126   CL 93* 02/03/2014 1007   CO2 28 07/13/2015 1126   CO2 25 02/03/2014 1007   GLUCOSE 119* 07/13/2015 1126   GLUCOSE 258* 02/03/2014 1007   BUN 29* 07/13/2015 1126   BUN 58* 02/03/2014 1007  CREATININE 13.92* 07/13/2015 1126   CREATININE 11.14* 02/03/2014 1007   CALCIUM 8.8* 07/13/2015 1126   CALCIUM 8.4* 02/03/2014 1007   CALCIUM 8.1* 08/23/2010 1000   GFRNONAA 3* 07/13/2015 1126   GFRNONAA 4* 02/03/2014 1007   GFRNONAA 5* 07/19/2013 0416   GFRAA 4* 07/13/2015 1126   GFRAA 5* 02/03/2014 1007   GFRAA 6* 07/19/2013 0416   Estimated Creatinine Clearance: 7.4 mL/min (by C-G formula based on Cr of 13.92).  COAG Lab Results  Component Value Date   INR 1.1 04/08/2013   INR 0.9 03/23/2011   INR 1.22 08/20/2010    Radiology No results found.  Assessment/Plan 1.  Complication dialysis device with thrombosis AV access:  Patient's right arm dialysis access is thrombosed. The patient will undergo thrombectomy using interventional techniques. Potassium will be drawn to ensure that it is an appropriate level prior to performing thrombectomy. 2.  End-stage renal disease requiring hemodialysis:  Patient will continue  dialysis therapy without further interruption if a successful thrombectomy is not achieved then catheter will be placed. Dialysis has already been arranged since the patient missed their previous session 3.  Hypertension:  Patient will continue medical management; nephrology is following no changes in oral medications. 4. Diabetes mellitus:  Glucose will be monitored and oral medications been held this morning once the patient has undergone the patient's procedure po intake will be reinitiated and again Accu-Cheks will be used to assess the blood glucose level and treat as needed. The patient will be restarted on the patient's usual hypoglycemic regime   Schnier, Dolores Lory, MD  07/17/2015 12:41 PM

## 2015-07-20 ENCOUNTER — Encounter: Payer: Self-pay | Admitting: Vascular Surgery

## 2015-08-04 ENCOUNTER — Emergency Department
Admission: EM | Admit: 2015-08-04 | Discharge: 2015-08-04 | Disposition: A | Discharge status: Home / Self Care | Payer: Medicare Other | Attending: Student | Admitting: Student

## 2015-08-04 DIAGNOSIS — F17210 Nicotine dependence, cigarettes, uncomplicated: Secondary | ICD-10-CM | POA: Insufficient documentation

## 2015-08-04 DIAGNOSIS — N186 End stage renal disease: Secondary | ICD-10-CM | POA: Diagnosis not present

## 2015-08-04 DIAGNOSIS — Z79899 Other long term (current) drug therapy: Secondary | ICD-10-CM | POA: Insufficient documentation

## 2015-08-04 DIAGNOSIS — T8241XA Breakdown (mechanical) of vascular dialysis catheter, initial encounter: Secondary | ICD-10-CM | POA: Diagnosis not present

## 2015-08-04 DIAGNOSIS — Z452 Encounter for adjustment and management of vascular access device: Secondary | ICD-10-CM | POA: Diagnosis present

## 2015-08-04 DIAGNOSIS — Z7982 Long term (current) use of aspirin: Secondary | ICD-10-CM | POA: Diagnosis not present

## 2015-08-04 DIAGNOSIS — Z992 Dependence on renal dialysis: Secondary | ICD-10-CM | POA: Insufficient documentation

## 2015-08-04 DIAGNOSIS — I120 Hypertensive chronic kidney disease with stage 5 chronic kidney disease or end stage renal disease: Secondary | ICD-10-CM | POA: Insufficient documentation

## 2015-08-04 DIAGNOSIS — J45909 Unspecified asthma, uncomplicated: Secondary | ICD-10-CM | POA: Insufficient documentation

## 2015-08-04 DIAGNOSIS — Y828 Other medical devices associated with adverse incidents: Secondary | ICD-10-CM | POA: Insufficient documentation

## 2015-08-04 DIAGNOSIS — Z794 Long term (current) use of insulin: Secondary | ICD-10-CM | POA: Diagnosis not present

## 2015-08-04 LAB — CBC WITH DIFFERENTIAL/PLATELET
Basophils Absolute: 0.1 10*3/uL (ref 0–0.1)
Basophils Relative: 1 %
EOS PCT: 4 %
Eosinophils Absolute: 0.3 10*3/uL (ref 0–0.7)
HCT: 33.7 % — ABNORMAL LOW (ref 35.0–47.0)
Hemoglobin: 11.4 g/dL — ABNORMAL LOW (ref 12.0–16.0)
LYMPHS ABS: 2 10*3/uL (ref 1.0–3.6)
LYMPHS PCT: 26 %
MCH: 28.6 pg (ref 26.0–34.0)
MCHC: 33.8 g/dL (ref 32.0–36.0)
MCV: 84.8 fL (ref 80.0–100.0)
MONO ABS: 0.8 10*3/uL (ref 0.2–0.9)
MONOS PCT: 11 %
Neutro Abs: 4.4 10*3/uL (ref 1.4–6.5)
Neutrophils Relative %: 58 %
PLATELETS: 238 10*3/uL (ref 150–440)
RBC: 3.97 MIL/uL (ref 3.80–5.20)
RDW: 18.6 % — AB (ref 11.5–14.5)
WBC: 7.6 10*3/uL (ref 3.6–11.0)

## 2015-08-04 LAB — BASIC METABOLIC PANEL
Anion gap: 13 (ref 5–15)
BUN: 20 mg/dL (ref 6–20)
CALCIUM: 8.9 mg/dL (ref 8.9–10.3)
CO2: 27 mmol/L (ref 22–32)
Chloride: 96 mmol/L — ABNORMAL LOW (ref 101–111)
Creatinine, Ser: 11.94 mg/dL — ABNORMAL HIGH (ref 0.44–1.00)
GFR calc Af Amer: 4 mL/min — ABNORMAL LOW (ref >60)
GFR, EST NON AFRICAN AMERICAN: 4 mL/min — AB (ref >60)
GLUCOSE: 118 mg/dL — AB (ref 65–99)
Potassium: 3.9 mmol/L (ref 3.5–5.1)
Sodium: 136 mmol/L (ref 135–145)

## 2015-08-04 NOTE — ED Notes (Signed)
Pt presents stating she was told her dialysis catheter was clotted today. Pt was unable to complete dialysis.

## 2015-08-04 NOTE — Discharge Instructions (Signed)
Dialysis Vascular Access Malfunction °A vascular access is an entrance to your blood vessels that can be used for dialysis. A vascular access can be made in one of several ways:  °· Joining an artery to a vein under your skin to make a bigger blood vessel called a fistula.   °· Joining an artery to a vein under your skin using a soft tube called a graft.   °· Placing a thin, flexible tube (catheter) in a large vein, usually in your neck.   °A vascular access may malfunction or become blocked.  °WHAT CAN CAUSE YOUR VASCULAR ACCESS TO MALFUNCTION? °· Infection (common).   °· A blood clot inside a part of the fistula, graft, or catheter. A blood clot can completely or partially block the flow of blood.   °· A kink in the graft or catheter.   °· A collection of blood (called a hematoma or bruise) next to the graft or catheter that pushes against it, blocking the flow of blood.   °WHAT ARE SIGNS AND SYMPTOMS OF VASCULAR ACCESS MALFUNCTION? °· There is a change in the vibration or pulse of your fistula or graft. °· The vibration or pulse of your fistula or graft is gone.   °· There is new or unusual swelling of the area around the access.   °· There was an unsuccessful puncture of your access by the dialysis team.   °· The flow of blood through the fistula, graft, or catheter is too slow for effective dialysis.   °· When routine dialysis is completed and the needle is removed, bleeding lasts for too long a time.   °WHAT HAPPENS IF MY VASCULAR ACCESS MALFUNCTIONS? °Your health care provider may order blood work, cultures, or an X-ray test in order to learn what may be wrong with your vascular access. The X-ray test involves the injection of a liquid into the vascular access. The liquid shows up on the X-ray and allows your health care provider to see if there is a blockage in the vascular access.  °Treatment varies depending on the cause of the malfunction:   °· If the vascular access is infected, your health care provider  may prescribe antibiotic medicine to control the infection.   °· If a clot is found in the vascular access, you may need surgery to remove the clot.   °· If a blockage in the vascular access is due to some other cause (such as a kink in a graft), then you will likely need surgery to unblock or replace the graft.   °HOME CARE INSTRUCTIONS: °Follow up with your surgeon or other health care provider if you were instructed to do so. This is very important. Any delay in follow-up could cause permanent dysfunction of the vascular access, which may be dangerous.  °SEEK MEDICAL CARE IF:  °· Fever develops.   °· Swelling and pain around the vascular access gets worse or new pain develops. °· Pain, numbness, or an unusual pale skin color develops in the hand on the side of your vascular access. °SEEK IMMEDIATE MEDICAL CARE IF: °Unusual bleeding develops at the location of the vascular access. °MAKE SURE YOU: °· Understand these instructions. °· Will watch your condition. °· Will get help right away if you are not doing well or get worse. °  °This information is not intended to replace advice given to you by your health care provider. Make sure you discuss any questions you have with your health care provider. °  °Document Released: 12/20/2005 Document Revised: 02/07/2014 Document Reviewed: 06/21/2012 °Elsevier Interactive Patient Education ©2016 [employer] ° °

## 2015-08-04 NOTE — ED Notes (Signed)
E-signature box not working. Pt verbalized understanding of discharge instructions and verbalized understanding.

## 2015-08-04 NOTE — ED Provider Notes (Addendum)
[facility] Emergency Department Provider Note   ____________________________________________  Time seen: Approximately 8:29 AM  I have reviewed the triage vital signs and the nursing notes.   HISTORY  Chief Complaint Vascular Access Problem    HPI [name] is a 30 y.o. female with history of asthma, diabetes, hypertension, end-stage renal disease on dialysis dialyzed Tuesday Thursday and Saturday, last completely dialyzed 3 days ago who presents from dialysis for nonfunctioning tunneled hemodialysis catheter today, gradual onset, constant, severe, no modifying factors. Patient reports that she reported for routine dialysis today, they were able to flush the catheter however were unable to withdraw anything from the catheter, they instilled a small amount of  (2 mg) activase with no improvement and sent her to the emergency department for evaluation. Patient denies any chest pain, difficulty breathing, abdominal pain, diarrhea, fevers or chills. 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 She has been in her usual state of health without any illness.   Past Medical History  Diagnosis Date  . Diabetes mellitus   . Hypertension   . Renal insufficiency   . Morbid obesity ([place])   . Asthma   . Irregular heart beat   . 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 Complication from renal dialysis device 07/07/2014  . End stage renal disease ([place]) 12/29/2010    Past Surgical History  Procedure Laterality Date  . Eye surgery      laser photocoagulation  . Av fistula placement  08/26/10    Left Radiocephalic AVF  . Peripheral vascular catheterization N/A 07/07/2014    Procedure: A/V Shuntogram/Fistulagram;  Surgeon: Algernon Huxley, MD;  Location: [place] [facility];  Service: Cardiovascular;  Laterality: N/A;  . Peripheral vascular  catheterization N/A 07/07/2014    Procedure: A/V Shunt Intervention;  Surgeon: Algernon Huxley, MD;  Location: [place] [facility];  Service: Cardiovascular;  Laterality: N/A;  . Intrauterine device (iud) insertion    . Peripheral vascular catheterization Right 01/12/2015    Procedure: A/V Shuntogram/Fistulagram;  Surgeon: Algernon Huxley, MD;  Location: [place] [facility];  Service: Cardiovascular;  Laterality: Right;  . Peripheral vascular catheterization N/A 01/12/2015    Procedure: A/V Shunt Intervention;  Surgeon: Algernon Huxley, MD;  Location: [place] [facility];  Service: Cardiovascular;  Laterality: N/A;  . Peripheral vascular catheterization Right 06/08/2015    Procedure: A/V Shuntogram/Fistulagram;  Surgeon: Algernon Huxley, MD;  Location: [place] [facility];  Service: Cardiovascular;  Laterality: Right;  . Peripheral vascular catheterization N/A 06/08/2015    Procedure: A/V Shunt Intervention;  Surgeon: Algernon Huxley, MD;  Location: [place] [facility];  Service: Cardiovascular;  Laterality: N/A;  . Peripheral vascular catheterization Right 07/14/2015    Procedure: Thrombectomy;  Surgeon: Katha Cabal, MD;  Location: [place] [facility];  Service: Cardiovascular;  Laterality: Right;  . Peripheral vascular catheterization N/A 07/14/2015    Procedure: A/V Shuntogram/Fistulagram;  Surgeon: Katha Cabal, MD;  Location: [place] [facility];  Service: Cardiovascular;  Laterality: N/A;  . Peripheral vascular catheterization N/A 07/17/2015    Procedure: Thrombectomy;  Surgeon: Katha Cabal, MD;  Location: [place] [facility];  Service: Cardiovascular;  Laterality: N/A;  . Peripheral vascular catheterization N/A 07/17/2015    Procedure: Dialysis/Perma Catheter Insertion;  Surgeon: Katha Cabal, MD;  Location:  [place] [facility];  Service: Cardiovascular;  Laterality: N/A;    Current Outpatient Rx  Name  Route  Sig  Dispense  Refill  . amLODipine (NORVASC) 10 MG tablet   Oral   Take  10 mg by mouth at bedtime.           [name] Kitchen aspirin 81 MG tablet   Oral   Take 81 mg by mouth daily.           [name] Kitchen b complex-vitamin c-folic acid (NEPHRO-VITE) 0.8 MG TABS   Oral   Take 0.8 mg by mouth at bedtime.           . busPIRone (BUSPAR) 10 MG tablet   Oral   Take 10 mg by mouth daily. Reported on 07/14/2015         . calcium acetate, Phos Binder, (PHOSLYRA) 667 MG/5ML SOLN   Oral   Take by mouth 3 (three) times daily with meals. Reported on 07/14/2015         . cinacalcet (SENSIPAR) 30 MG tablet   Oral   Take 30 mg by mouth daily with supper.         . cyclobenzaprine (FLEXERIL) 10 MG tablet   Oral   Take 1 tablet (10 mg total) by mouth 3 (three) times daily as needed for muscle spasms.   15 tablet   0   . darbepoetin (ARANESP, ALB FREE, SURECLICK) [id number] SOLN   Subcutaneous   Inject 200 mcg into the skin every 7 (seven) days. Reported on 07/14/2015         . desvenlafaxine (PRISTIQ) 50 MG 24 hr tablet   Oral   Take 50 mg by mouth daily.         . diazepam (VALIUM) 2 MG tablet   Oral   Take 1 tablet (2 mg total) by mouth every 8 (eight) hours as needed for muscle spasms.   9 tablet   0   . enoxaparin (LOVENOX) 30 MG/0.3ML SOLN   Subcutaneous   Inject 30 mg into the skin daily. Reported on 07/14/2015         . Ferric Citrate (AURYXIA) 1 GM 210 MG(Fe) TABS   Oral   Take 420 mg by mouth 2 (two) times daily before a meal.         . gabapentin (NEURONTIN) 100 MG capsule   Oral   Take 100 mg by mouth daily.          . insulin aspart (NOVOLOG) 100 UNIT/ML injection   Subcutaneous   Inject 4 Units into the skin 3 (three) times daily before meals.          . insulin glargine (LANTUS) 100 UNIT/ML injection   Subcutaneous   Inject 23 Units into the skin at bedtime.         . insulin NPH-insulin regular (NOVOLIN 70/30) (70-30) 100 UNIT/ML injection   Subcutaneous   Inject 10 Units into the skin 2 (two) times daily.           [name] Kitchen  labetalol (NORMODYNE) 200 MG tablet   Oral   Take 200 mg by mouth 2 (two) times daily. Reported on 06/08/2015         . levETIRAcetam (KEPPRA) 750 MG tablet   Oral   Take 750 mg by mouth 2 (two) times daily.         . meloxicam (MOBIC) 15 MG tablet   Oral   Take 1 tablet (15 mg total)  by mouth daily.   30 tablet   0   . metoprolol succinate (TOPROL-XL) 100 MG 24 hr tablet   Oral   Take 100 mg by mouth daily. Reported on 06/08/2015         . metoprolol succinate (TOPROL-XL) 25 MG 24 hr tablet   Oral   Take 25 mg by mouth daily. Reported on 06/08/2015         . nicotine polacrilex (NICORETTE) 2 MG gum   Oral   Take 2 mg by mouth every 6 (six) hours as needed for smoking cessation. Reported on 06/08/2015         . omeprazole (PRILOSEC) 20 MG capsule   Oral   Take 20 mg by mouth daily.         [name] Kitchen oxyCODONE-acetaminophen (PERCOCET) 5-325 MG tablet   Oral   Take 1 tablet by mouth every 4 (four) hours as needed for severe pain.   20 tablet   0   . oxyCODONE-acetaminophen (PERCOCET/ROXICET) 5-325 MG tablet   Oral   Take 2 tablets by mouth every 4 (four) hours as needed for severe pain.   15 tablet   0   . pravastatin (PRAVACHOL) 40 MG tablet   Oral   Take 40 mg by mouth daily.         . predniSONE (DELTASONE) 10 MG tablet   Oral   Take 1 tablet (10 mg total) by mouth as directed.   21 tablet   0     Take on a daily basis of 6, 5, 4, 3, 2, 1   . promethazine (PHENERGAN) 12.5 MG tablet   Oral   Take 12.5 mg by mouth every 6 (six) hours as needed for nausea or vomiting.         . sevelamer (RENAGEL) 800 MG tablet   Oral   Take 2,400 mg by mouth 3 (three) times daily with meals.            Allergies Vancomycin; Latex; and Marcillin  No family history on file.  Social History Social History  Substance Use Topics  . Smoking status: Current Every Day Smoker -- 0.00 packs/day for 10 years    Types: Cigarettes  . Smokeless tobacco: Not on file  .  Alcohol Use: No    Review of Systems Constitutional: No fever/chills Eyes: No visual changes. ENT: No sore throat. Cardiovascular: Denies chest pain. Respiratory: Denies shortness of breath. Gastrointestinal: No abdominal pain.  + chronic nausea, no vomiting.  No diarrhea.  No constipation. Genitourinary: Negative for dysuria. Musculoskeletal: Negative for back pain. Skin: Negative for rash. Neurological: Negative for headaches, focal weakness or numbness.  10-point ROS otherwise negative.  ____________________________________________   PHYSICAL EXAM:  VITAL SIGNS: ED Triage Vitals  Enc Vitals Group     BP 08/04/15 0818 163/97 mmHg     Pulse Rate 08/04/15 0818 96     Resp 08/04/15 0818 17     Temp 08/04/15 0818 98.1 F (36.7 C)     Temp Source 08/04/15 0818 Oral     SpO2 08/04/15 0818 96 %     Weight 08/04/15 0818 240 lb (108.863 kg)     Height 08/04/15 0818 5\' 4"  (1.626 m)     Head Cir --      Peak Flow --      Pain Score 08/04/15 0819 2     Pain Loc --      Pain Edu? --  Excl. in [place]? --     Constitutional: Alert and oriented. Well appearing and in no acute distress. Eyes: Conjunctivae are normal. PERRL. EOMI. Head: Atraumatic. Nose: No congestion/rhinnorhea. Mouth/Throat: Mucous membranes are moist.  Oropharynx non-erythematous. Neck: No stridor.   Cardiovascular: Normal rate, regular rhythm. Grossly normal heart sounds.  Good peripheral circulation. Respiratory: Normal respiratory effort.  No retractions. Lungs CTAB. Gastrointestinal: Soft and nontender. No distention.  No CVA tenderness. Genitourinary: deferred Musculoskeletal: No lower extremity tenderness nor edema.  No joint effusions. Neurologic:  Normal speech and language. No gross focal neurologic deficits are appreciated. No gait instability. Skin:  Skin is warm, dry and intact. No rash noted. Tunneled HD catheter in the right chest upper wall without erythema, drainage, or surrounding  tenderness. Psychiatric: Mood and affect are normal. Speech and behavior are normal.  ____________________________________________   LABS (all labs ordered are listed, but only abnormal results are displayed)  Labs Reviewed  CBC WITH DIFFERENTIAL/PLATELET - Abnormal; Notable for the following:    Hemoglobin 11.4 (*)    HCT 33.7 (*)    RDW 18.6 (*)    All other components within normal limits  BASIC METABOLIC PANEL - Abnormal; Notable for the following:    Chloride 96 (*)    Glucose, Bld 118 (*)    Creatinine, Ser 11.94 (*)    GFR calc non Af Amer 4 (*)    GFR calc Af Amer 4 (*)    All other components within normal limits   ____________________________________________  EKG  none ____________________________________________  RADIOLOGY  none ____________________________________________   PROCEDURES  Procedure(s) performed: None  Procedures  Critical Care performed: No  ____________________________________________   INITIAL IMPRESSION / ASSESSMENT AND PLAN / ED COURSE  Pertinent labs & imaging results that were available during my care of the patient were reviewed by me and considered in my medical decision making (see chart for details).  [name] is a 30 y.o. female with history of asthma, diabetes, hypertension, end-stage renal disease on dialysis dialyzed Tuesday Thursday and Saturday, last completely dialyzed 3 days ago who presents from dialysis for nonfunctioning tunneled hemodialysis catheter today. On exam, she is very well-appearing in no acute distress. Her vital signs are stable, she is afebrile. She has no acute complaints, no evidence to suggest acute volume overload. I reviewed her labs. BMP shows normal potassium, creatinine elevated at 11.9 for as expected and end-stage renal disease. CBC with mild anemia which is unchanged from prior. There is no indication for emergent dialysis today. I discussed the case with Dr. Holley Raring of nephrology who  reports that typically, when this happens, the dialysis catheter will require replacement. I discussed the case with Dr. Trula Slade of vascular surgery who reports that the patient should report to special procedures/short stay tomorrow at 8 AM and her catheter will be replaced. I discussed this with the patient. We discussed return precautions and she is comfortable with the discharge plan. DC home. ____________________________________________   FINAL CLINICAL IMPRESSION(S) / ED DIAGNOSES  Final diagnoses:  Hemodialysis catheter dysfunction, initial encounter ([place])      NEW MEDICATIONS STARTED DURING THIS VISIT:  New Prescriptions   No medications on file     Note:  This document was prepared using Dragon voice recognition software and may include unintentional dictation errors.    Joanne Gavel, MD 08/04/15 [id number]  Joanne Gavel, MD 08/04/15 [phone]

## 2015-08-04 NOTE — ED Notes (Signed)
Pt arrives with reports that at dialysis this am her catheter was clotted  - staff injected activase into cath and they were unable to remove blockage  Sent from [facility] for declotting

## 2015-08-05 ENCOUNTER — Ambulatory Visit
Admission: RE | Admit: 2015-08-05 | Discharge: 2015-08-05 | Disposition: A | Discharge status: Home / Self Care | Payer: Medicare Other | Source: Ambulatory Visit | Attending: Vascular Surgery | Admitting: Vascular Surgery

## 2015-08-05 ENCOUNTER — Encounter
Admission: RE | Disposition: A | Discharge status: Home / Self Care | Payer: Self-pay | Source: Ambulatory Visit | Attending: Vascular Surgery

## 2015-08-05 DIAGNOSIS — E1122 Type 2 diabetes mellitus with diabetic chronic kidney disease: Secondary | ICD-10-CM | POA: Diagnosis not present

## 2015-08-05 DIAGNOSIS — F17210 Nicotine dependence, cigarettes, uncomplicated: Secondary | ICD-10-CM | POA: Diagnosis not present

## 2015-08-05 DIAGNOSIS — R51 Headache: Secondary | ICD-10-CM | POA: Insufficient documentation

## 2015-08-05 DIAGNOSIS — E7800 Pure hypercholesterolemia, unspecified: Secondary | ICD-10-CM | POA: Diagnosis not present

## 2015-08-05 DIAGNOSIS — I120 Hypertensive chronic kidney disease with stage 5 chronic kidney disease or end stage renal disease: Secondary | ICD-10-CM | POA: Insufficient documentation

## 2015-08-05 DIAGNOSIS — Y832 Surgical operation with anastomosis, bypass or graft as the cause of abnormal reaction of the patient, or of later complication, without mention of misadventure at the time of the procedure: Secondary | ICD-10-CM | POA: Insufficient documentation

## 2015-08-05 DIAGNOSIS — Z8342 Family history of familial hypercholesterolemia: Secondary | ICD-10-CM | POA: Insufficient documentation

## 2015-08-05 DIAGNOSIS — N186 End stage renal disease: Secondary | ICD-10-CM | POA: Diagnosis not present

## 2015-08-05 DIAGNOSIS — T82898A Other specified complication of vascular prosthetic devices, implants and grafts, initial encounter: Secondary | ICD-10-CM | POA: Diagnosis present

## 2015-08-05 DIAGNOSIS — Z992 Dependence on renal dialysis: Secondary | ICD-10-CM | POA: Insufficient documentation

## 2015-08-05 DIAGNOSIS — I7090 Unspecified atherosclerosis: Secondary | ICD-10-CM | POA: Diagnosis not present

## 2015-08-05 DIAGNOSIS — Z6841 Body Mass Index (BMI) 40.0 and over, adult: Secondary | ICD-10-CM | POA: Insufficient documentation

## 2015-08-05 DIAGNOSIS — Z833 Family history of diabetes mellitus: Secondary | ICD-10-CM | POA: Insufficient documentation

## 2015-08-05 DIAGNOSIS — Z794 Long term (current) use of insulin: Secondary | ICD-10-CM | POA: Diagnosis not present

## 2015-08-05 DIAGNOSIS — Z881 Allergy status to other antibiotic agents status: Secondary | ICD-10-CM | POA: Diagnosis not present

## 2015-08-05 DIAGNOSIS — Z8249 Family history of ischemic heart disease and other diseases of the circulatory system: Secondary | ICD-10-CM | POA: Insufficient documentation

## 2015-08-05 DIAGNOSIS — M79609 Pain in unspecified limb: Secondary | ICD-10-CM | POA: Insufficient documentation

## 2015-08-05 HISTORY — PX: PERIPHERAL VASCULAR CATHETERIZATION: SHX172C

## 2015-08-05 SURGERY — THROMBECTOMY
Anesthesia: Moderate Sedation

## 2015-08-05 MED ORDER — SODIUM CHLORIDE 0.9 % IV SOLN
INTRAVENOUS | Status: DC
  Administered 2015-08-05: 09:00:00 via INTRAVENOUS

## 2015-08-05 MED ORDER — FENTANYL CITRATE (PF) 100 MCG/2ML IJ SOLN
INTRAMUSCULAR | Status: DC | PRN
  Administered 2015-08-05: 100 ug via INTRAVENOUS
  Administered 2015-08-05: 50 ug via INTRAVENOUS

## 2015-08-05 MED ORDER — HEPARIN SODIUM (PORCINE) 10000 UNIT/ML IJ SOLN
INTRAMUSCULAR | Status: AC
  Filled 2015-08-05: qty 1

## 2015-08-05 MED ORDER — MIDAZOLAM HCL 5 MG/5ML IJ SOLN
INTRAMUSCULAR | Status: AC
  Filled 2015-08-05: qty 5

## 2015-08-05 MED ORDER — LIDOCAINE-EPINEPHRINE (PF) 1 %-1:200000 IJ SOLN
INTRAMUSCULAR | Status: AC
  Filled 2015-08-05: qty 30

## 2015-08-05 MED ORDER — FENTANYL CITRATE (PF) 100 MCG/2ML IJ SOLN
INTRAMUSCULAR | Status: AC
  Filled 2015-08-05: qty 2

## 2015-08-05 MED ORDER — SODIUM CHLORIDE 0.9 % IV SOLN: INTRAVENOUS | Status: DC

## 2015-08-05 MED ORDER — MIDAZOLAM HCL 2 MG/2ML IJ SOLN
INTRAMUSCULAR | Status: DC | PRN
Start: 2015-08-05 — End: 2015-08-05
  Administered 2015-08-05: 3 mg via INTRAVENOUS
  Administered 2015-08-05: 1 mg via INTRAVENOUS

## 2015-08-05 MED ORDER — HEPARIN (PORCINE) IN NACL 2-0.9 UNIT/ML-% IJ SOLN
INTRAMUSCULAR | Status: AC
  Filled 2015-08-05: qty 500

## 2015-08-05 SURGICAL SUPPLY — 3 items
CATH PALIN MAXID VT KIT 19CM (CATHETERS) ×3 IMPLANT
PACK ANGIOGRAPHY (CUSTOM PROCEDURE TRAY) ×3 IMPLANT
TOWEL OR 17X26 4PK STRL BLUE (TOWEL DISPOSABLE) ×3 IMPLANT

## 2015-08-05 NOTE — H&P (Signed)
[facility] History & Physical Update  The patient was interviewed and re-examined.  The patient's previous History and Physical has been reviewed and is unchanged.  There is no change in the plan of care. We plan to proceed with the scheduled procedure.  Earlena Werst, Dolores Lory, MD  08/05/2015, 9:24 AM

## 2015-08-05 NOTE — Op Note (Signed)
[facility]   OPERATIVE NOTE     PROCEDURE: 1. Exchange right IJ tunneled dialysis catheter over wire same access   PRE-OPERATIVE DIAGNOSIS: Complication of dialysis device with nonfunction of tunneled catheter; end-stage renal requiring hemodialysis  POST-OPERATIVE DIAGNOSIS: same as above  SURGEON: Katha Cabal, M.D.  ANESTHESIA: Conscious sedation was administered under my direct supervision. IV Versed plus fentanyl were utilized. Continuous ECG, pulse oximetry and blood pressure was monitored throughout the entire procedure.  Versed and Fentanyl were utilized.  Conscious sedation was for a total of 35 minutes.  ESTIMATED BLOOD LOSS: Minimal  FINDING(S): 1.  Tips of the catheter in the right atrium on fluoroscopy 2.  No obvious pneumothorax on fluoroscopy  SPECIMEN(S):  none  INDICATIONS:   [name] is a 30 y.o. female  presents with end stage renal disease.  Therefore, the patient requires a tunneled dialysis catheter placement.  The patient is informed of  the risks catheter placement include but are not limited to: bleeding, infection, central venous injury, pneumothorax, possible venous stenosis, possible malpositioning in the venous system, and possible infections related to long-term catheter presence.  The patient was aware of these risks and agreed to proceed.  DESCRIPTION: The patient was taken back to Special Procedure suite.  Prior to sedation, the patient was given IV antibiotics.  After obtaining adequate sedation, the patient was prepped and draped in the standard fashion for a chest or neck tunneled dialysis catheter placement.  Appropriate Time Out is called.   The the right neck and chest wall are then infiltrated with 1% Lidocaine with epinepherine.  A 19 cm tip to cuff palindrome catheter is then selected, opened on the back table and prepped.  The cuff of the existing catheter is then freed from surrounding attachments.  The catheter is then controlled with a hemostat and transected above the level of the cuff.  Under fluoroscopy an Amplatz Super Stiff wire is introduced through the catheter and negotiated into the inferior vena cava. The remaining portion of the catheter is then removed without difficulty.  A new catheter is then threaded over the wire and advanced under fluoroscopy so that the tip is in the mid atrium.  Each port was tested by aspirating and flushing.  No resistance was noted.  Each port was then thoroughly flushed with heparinized saline.  The catheter was secured in placed with two interrupted stitches of 0 silk tied to the catheter.   Each port was then packed with concentrated heparin (10,000 Units/mL) at the manufacturer recommended volumes to each port.  Sterile caps were applied to each port.  On completion fluoroscopy, the tips of the catheter were in the right atrium, and there was no evidence of pneumothorax.  COMPLICATIONS: None  CONDITION: [name] 08/05/2015,10:33 AM  [facility] Office: [phone]   08/05/2015, 10:33 AM

## 2015-08-11 ENCOUNTER — Other Ambulatory Visit: Payer: Self-pay | Admitting: Vascular Surgery

## 2015-08-19 ENCOUNTER — Encounter
Admission: RE | Admit: 2015-08-19 | Discharge: 2015-08-19 | Disposition: A | Discharge status: Home / Self Care | Payer: Medicare Other | Source: Ambulatory Visit | Attending: Vascular Surgery | Admitting: Vascular Surgery

## 2015-08-19 DIAGNOSIS — Z9884 Bariatric surgery status: Secondary | ICD-10-CM | POA: Diagnosis not present

## 2015-08-19 DIAGNOSIS — N186 End stage renal disease: Secondary | ICD-10-CM | POA: Diagnosis not present

## 2015-08-19 DIAGNOSIS — E1122 Type 2 diabetes mellitus with diabetic chronic kidney disease: Secondary | ICD-10-CM | POA: Diagnosis not present

## 2015-08-19 DIAGNOSIS — Z01812 Encounter for preprocedural laboratory examination: Secondary | ICD-10-CM | POA: Diagnosis present

## 2015-08-19 DIAGNOSIS — I120 Hypertensive chronic kidney disease with stage 5 chronic kidney disease or end stage renal disease: Secondary | ICD-10-CM | POA: Diagnosis not present

## 2015-08-19 HISTORY — DX: Cardiac arrhythmia, unspecified: I49.9

## 2015-08-19 LAB — CBC WITH DIFFERENTIAL/PLATELET
BASOS ABS: 0.1 10*3/uL (ref 0–0.1)
BASOS PCT: 1 %
EOS PCT: 5 %
Eosinophils Absolute: 0.4 10*3/uL (ref 0–0.7)
HCT: 38.6 % (ref 35.0–47.0)
Hemoglobin: 12.7 g/dL (ref 12.0–16.0)
Lymphocytes Relative: 27 %
Lymphs Abs: 2.4 10*3/uL (ref 1.0–3.6)
MCH: 28.2 pg (ref 26.0–34.0)
MCHC: 32.9 g/dL (ref 32.0–36.0)
MCV: 85.5 fL (ref 80.0–100.0)
MONO ABS: 0.7 10*3/uL (ref 0.2–0.9)
Monocytes Relative: 8 %
Neutro Abs: 5.3 10*3/uL (ref 1.4–6.5)
Neutrophils Relative %: 59 %
Platelets: 212 10*3/uL (ref 150–440)
RBC: 4.52 MIL/uL (ref 3.80–5.20)
RDW: 20.3 % — AB (ref 11.5–14.5)
WBC: 8.8 10*3/uL (ref 3.6–11.0)

## 2015-08-19 LAB — SURGICAL PCR SCREEN
MRSA, PCR: NEGATIVE
Staphylococcus aureus: NEGATIVE

## 2015-08-19 LAB — TYPE AND SCREEN
ABO/RH(D): O POS
Antibody Screen: NEGATIVE

## 2015-08-19 LAB — BASIC METABOLIC PANEL
Anion gap: 9 (ref 5–15)
BUN: 12 mg/dL (ref 6–20)
CALCIUM: 10.1 mg/dL (ref 8.9–10.3)
CO2: 32 mmol/L (ref 22–32)
CREATININE: 6.9 mg/dL — AB (ref 0.44–1.00)
Chloride: 96 mmol/L — ABNORMAL LOW (ref 101–111)
GFR calc Af Amer: 8 mL/min — ABNORMAL LOW (ref >60)
GFR, EST NON AFRICAN AMERICAN: 7 mL/min — AB (ref >60)
GLUCOSE: 142 mg/dL — AB (ref 65–99)
Potassium: 3.7 mmol/L (ref 3.5–5.1)
Sodium: 137 mmol/L (ref 135–145)

## 2015-08-19 LAB — PROTIME-INR
INR: 1.11
Prothrombin Time: 14.5 seconds (ref 11.4–15.0)

## 2015-08-19 LAB — APTT: APTT: 29 s (ref 24–36)

## 2015-08-19 NOTE — Patient Instructions (Signed)
  Your procedure is scheduled on:Wednesday July 26 , 2017. Report to Same Day Surgery. To find out your arrival time please call [phone] between 1PM - 3PM on Tuesday August 25, 2015.  Remember: Instructions that are not followed completely may result in serious medical risk, up to and including death, or upon the discretion of your surgeon and anesthesiologist your surgery may need to be rescheduled.    _x___ 1. Do not eat food or drink liquids after midnight. No gum chewing or hard candies.     ____ 2. No Alcohol for 24 hours before or after surgery.   ____ 3. Bring all medications with you on the day of surgery if instructed.    __x__ 4. Notify your doctor if there is any change in your medical condition     (cold, fever, infections).     Do not wear jewelry, make-up, hairpins, clips or nail polish.  Do not wear lotions, powders, or perfumes. You may wear deodorant.  Do not shave 48 hours prior to surgery. Men may shave face and neck.  Do not bring valuables to the hospital.    [facility] is not responsible for any belongings or valuables.               Contacts, dentures or bridgework may not be worn into surgery.  Leave your suitcase in the car. After surgery it may be brought to your room.  For patients admitted to the hospital, discharge time is determined by your treatment team.   Patients discharged the day of surgery will not be allowed to drive home.    Please read over the following fact sheets that you were given:   [facility] Preparing for Surgery  _x___ Take these medicines the morning of surgery with A SIP OF WATER:    1. omeprazole (PRILOSEC)      ____ Fleet Enema (as directed)   _x___ Use SAGE wipes as directed on instruction sheet  ____ Use inhalers on the day of surgery and bring to hospital day of surgery  ____ Stop metformin 2 days prior to surgery    _x___ Take 1/2 of usual insulin dose the night before surgery and none on the morning of  surgery.   ____ Stop Coumadin/Plavix/aspirin on does not apply.  ____ Stop Anti-inflammatories such as Advil, Aleve, Ibuprofen, Motrin, Naproxen,  Naprosyn, Goodies powders or aspirin products.   __x__ Stop supplementsb complex-vitamin c-folic acid (NEPHRO-VITE): until after surgery.    ____ Bring C-Pap to the hospital.

## 2015-08-19 NOTE — Pre-Procedure Instructions (Signed)
EKG done in ED on 07/13/2015, the day before the procedure was done  to remove blood clot from dialysis catheter in special recovery by Dr. Delana Meyer.

## 2015-08-26 ENCOUNTER — Ambulatory Visit: Payer: Medicare Other | Admitting: Anesthesiology

## 2015-08-26 ENCOUNTER — Ambulatory Visit
Admission: RE | Admit: 2015-08-26 | Discharge: 2015-08-26 | Disposition: A | Discharge status: Home / Self Care | Payer: Medicare Other | Source: Ambulatory Visit | Attending: Vascular Surgery | Admitting: Vascular Surgery

## 2015-08-26 ENCOUNTER — Encounter
Admission: RE | Disposition: A | Discharge status: Home / Self Care | Payer: Self-pay | Source: Ambulatory Visit | Attending: Vascular Surgery

## 2015-08-26 DIAGNOSIS — I7090 Unspecified atherosclerosis: Secondary | ICD-10-CM | POA: Diagnosis not present

## 2015-08-26 DIAGNOSIS — Z6841 Body Mass Index (BMI) 40.0 and over, adult: Secondary | ICD-10-CM | POA: Insufficient documentation

## 2015-08-26 DIAGNOSIS — Z992 Dependence on renal dialysis: Secondary | ICD-10-CM | POA: Diagnosis not present

## 2015-08-26 DIAGNOSIS — Z881 Allergy status to other antibiotic agents status: Secondary | ICD-10-CM | POA: Diagnosis not present

## 2015-08-26 DIAGNOSIS — I120 Hypertensive chronic kidney disease with stage 5 chronic kidney disease or end stage renal disease: Secondary | ICD-10-CM | POA: Insufficient documentation

## 2015-08-26 DIAGNOSIS — N186 End stage renal disease: Secondary | ICD-10-CM | POA: Diagnosis present

## 2015-08-26 DIAGNOSIS — Z79899 Other long term (current) drug therapy: Secondary | ICD-10-CM | POA: Insufficient documentation

## 2015-08-26 DIAGNOSIS — Z9889 Other specified postprocedural states: Secondary | ICD-10-CM | POA: Diagnosis not present

## 2015-08-26 DIAGNOSIS — E1122 Type 2 diabetes mellitus with diabetic chronic kidney disease: Secondary | ICD-10-CM | POA: Diagnosis not present

## 2015-08-26 DIAGNOSIS — E7800 Pure hypercholesterolemia, unspecified: Secondary | ICD-10-CM | POA: Insufficient documentation

## 2015-08-26 DIAGNOSIS — Z794 Long term (current) use of insulin: Secondary | ICD-10-CM | POA: Diagnosis not present

## 2015-08-26 HISTORY — PX: AV FISTULA PLACEMENT: SHX1204

## 2015-08-26 LAB — POCT I-STAT 4, (NA,K, GLUC, HGB,HCT)
GLUCOSE: 133 mg/dL — AB (ref 65–99)
HCT: 39 % (ref 36.0–46.0)
Hemoglobin: 13.3 g/dL (ref 12.0–15.0)
Potassium: 4.7 mmol/L (ref 3.5–5.1)
Sodium: 138 mmol/L (ref 135–145)

## 2015-08-26 LAB — HCG, QUANTITATIVE, PREGNANCY: HCG, BETA CHAIN, QUANT, S: 3 m[IU]/mL (ref <5)

## 2015-08-26 LAB — GLUCOSE, CAPILLARY
GLUCOSE-CAPILLARY: 119 mg/dL — AB (ref 65–99)
GLUCOSE-CAPILLARY: 101 mg/dL — AB (ref 65–99)

## 2015-08-26 SURGERY — ARTERIOVENOUS (AV) FISTULA CREATION
Anesthesia: General | Laterality: Left | Wound class: Clean

## 2015-08-26 MED ORDER — MIDAZOLAM HCL 2 MG/2ML IJ SOLN
INTRAMUSCULAR | Status: DC | PRN
  Administered 2015-08-26: 2 mg via INTRAVENOUS

## 2015-08-26 MED ORDER — HEPARIN SODIUM (PORCINE) 1000 UNIT/ML IJ SOLN
INTRAMUSCULAR | Status: DC | PRN
  Administered 2015-08-26: 3000 [IU] via INTRAVENOUS

## 2015-08-26 MED ORDER — CLINDAMYCIN PHOSPHATE 300 MG/50ML IV SOLN
INTRAVENOUS | Status: AC
  Administered 2015-08-26: 300 mg via INTRAVENOUS
  Filled 2015-08-26: qty 50

## 2015-08-26 MED ORDER — SUGAMMADEX SODIUM 200 MG/2ML IV SOLN
INTRAVENOUS | Status: DC | PRN
  Administered 2015-08-26: 214 mg via INTRAVENOUS

## 2015-08-26 MED ORDER — CHLORHEXIDINE GLUCONATE CLOTH 2 % EX PADS: 6.0000 | MEDICATED_PAD | Freq: Once | CUTANEOUS | Status: DC

## 2015-08-26 MED ORDER — BUPIVACAINE HCL (PF) 0.5 % IJ SOLN
INTRAMUSCULAR | Status: AC
  Filled 2015-08-26: qty 30

## 2015-08-26 MED ORDER — FENTANYL CITRATE (PF) 100 MCG/2ML IJ SOLN
INTRAMUSCULAR | Status: DC | PRN
  Administered 2015-08-26 (×2): 50 ug via INTRAVENOUS

## 2015-08-26 MED ORDER — ONDANSETRON HCL 4 MG/2ML IJ SOLN: 4.0000 mg | Freq: Once | INTRAMUSCULAR | Status: DC | PRN

## 2015-08-26 MED ORDER — ONDANSETRON HCL 4 MG/2ML IJ SOLN
INTRAMUSCULAR | Status: DC | PRN
  Administered 2015-08-26: 4 mg via INTRAVENOUS

## 2015-08-26 MED ORDER — SODIUM CHLORIDE 0.9 % IV SOLN
INTRAVENOUS | Status: DC
  Administered 2015-08-26 (×2): via INTRAVENOUS

## 2015-08-26 MED ORDER — LIDOCAINE HCL (CARDIAC) 20 MG/ML IV SOLN
INTRAVENOUS | Status: DC | PRN
  Administered 2015-08-26: 50 mg via INTRAVENOUS

## 2015-08-26 MED ORDER — FENTANYL CITRATE (PF) 100 MCG/2ML IJ SOLN
25.0000 ug | INTRAMUSCULAR | Status: DC | PRN
  Administered 2015-08-26 (×3): 25 ug via INTRAVENOUS

## 2015-08-26 MED ORDER — METOPROLOL TARTRATE 5 MG/5ML IV SOLN
INTRAVENOUS | Status: DC | PRN
  Administered 2015-08-26: 2 mg via INTRAVENOUS

## 2015-08-26 MED ORDER — OXYCODONE-ACETAMINOPHEN 5-325 MG PO TABS: 1.0000 | ORAL_TABLET | ORAL | 0 refills | Status: DC | PRN

## 2015-08-26 MED ORDER — DEXAMETHASONE SODIUM PHOSPHATE 10 MG/ML IJ SOLN
INTRAMUSCULAR | Status: DC | PRN
  Administered 2015-08-26: 5 mg via INTRAVENOUS

## 2015-08-26 MED ORDER — ACETAMINOPHEN 10 MG/ML IV SOLN
INTRAVENOUS | Status: AC
  Filled 2015-08-26: qty 100

## 2015-08-26 MED ORDER — FENTANYL CITRATE (PF) 100 MCG/2ML IJ SOLN
INTRAMUSCULAR | Status: AC
  Administered 2015-08-26: 25 ug via INTRAVENOUS
  Filled 2015-08-26: qty 2

## 2015-08-26 MED ORDER — SUCCINYLCHOLINE CHLORIDE 20 MG/ML IJ SOLN
INTRAMUSCULAR | Status: DC | PRN
  Administered 2015-08-26: 100 mg via INTRAVENOUS

## 2015-08-26 MED ORDER — HEPARIN SODIUM (PORCINE) 5000 UNIT/ML IJ SOLN
INTRAMUSCULAR | Status: AC
  Filled 2015-08-26: qty 1

## 2015-08-26 MED ORDER — PROPOFOL 10 MG/ML IV BOLUS
INTRAVENOUS | Status: DC | PRN
  Administered 2015-08-26: 40 mg via INTRAVENOUS
  Administered 2015-08-26: 160 mg via INTRAVENOUS

## 2015-08-26 MED ORDER — CLINDAMYCIN PHOSPHATE 300 MG/50ML IV SOLN
300.0000 mg | Freq: Once | INTRAVENOUS | Status: AC
  Administered 2015-08-26: 300 mg via INTRAVENOUS

## 2015-08-26 MED ORDER — ROCURONIUM BROMIDE 100 MG/10ML IV SOLN
INTRAVENOUS | Status: DC | PRN
  Administered 2015-08-26: 25 mg via INTRAVENOUS

## 2015-08-26 SURGICAL SUPPLY — 54 items
BAG DECANTER FOR FLEXI CONT (MISCELLANEOUS) ×8 IMPLANT
BLADE SURG SZ11 CARB STEEL (BLADE) ×4 IMPLANT
BOOT SUTURE AID YELLOW STND (SUTURE) ×4 IMPLANT
BRUSH SCRUB 4% CHG (MISCELLANEOUS) ×4 IMPLANT
CANISTER SUCT 1200ML W/VALVE (MISCELLANEOUS) ×4 IMPLANT
CHLORAPREP W/TINT 26ML (MISCELLANEOUS) ×4 IMPLANT
CLIP SPRNG 6MM S-JAW DBL (CLIP) ×4
DECANTER SPIKE VIAL GLASS SM (MISCELLANEOUS) ×4 IMPLANT
DRAPE INCISE IOBAN 66X45 STRL (DRAPES) IMPLANT
DRAPE LAPAROTOMY 100X77 ABD (DRAPES) IMPLANT
ELECT REM PT RETURN 9FT ADLT (ELECTROSURGICAL) ×4
ELECTRODE REM PT RTRN 9FT ADLT (ELECTROSURGICAL) ×2 IMPLANT
GEL ULTRASOUND 20GR AQUASONIC (MISCELLANEOUS) IMPLANT
GLOVE BIO SURGEON STRL SZ7 (GLOVE) IMPLANT
GLOVE INDICATOR 7.5 STRL GRN (GLOVE) IMPLANT
GLOVE PROTEXIS LATEX SZ 7.5 (GLOVE) ×24 IMPLANT
GOWN STRL REUS W/ TWL LRG LVL3 (GOWN DISPOSABLE) ×4 IMPLANT
GOWN STRL REUS W/ TWL XL LVL3 (GOWN DISPOSABLE) ×2 IMPLANT
GOWN STRL REUS W/TWL LRG LVL3 (GOWN DISPOSABLE) ×4
GOWN STRL REUS W/TWL XL LVL3 (GOWN DISPOSABLE) ×2
HEMOSTAT SURGICEL 2X3 (HEMOSTASIS) ×4 IMPLANT
IV NS 500ML (IV SOLUTION) ×2
IV NS 500ML BAXH (IV SOLUTION) ×2 IMPLANT
KIT RM TURNOVER STRD PROC AR (KITS) ×4 IMPLANT
LABEL OR SOLS (LABEL) ×4 IMPLANT
LIQUID BAND (GAUZE/BANDAGES/DRESSINGS) ×4 IMPLANT
LOOP RED MAXI  1X406MM (MISCELLANEOUS) ×2
LOOP VESSEL MAXI 1X406 RED (MISCELLANEOUS) ×2 IMPLANT
LOOP VESSEL MINI 0.8X406 BLUE (MISCELLANEOUS) ×2 IMPLANT
LOOPS BLUE MINI 0.8X406MM (MISCELLANEOUS) ×2
NEEDLE FILTER BLUNT 18X 1/2SAF (NEEDLE) ×2
NEEDLE FILTER BLUNT 18X1 1/2 (NEEDLE) ×2 IMPLANT
NEEDLE HYPO 30X.5 LL (NEEDLE) IMPLANT
NS IRRIG 500ML POUR BTL (IV SOLUTION) ×4 IMPLANT
PACK EXTREMITY ARMC (MISCELLANEOUS) ×4 IMPLANT
PAD PREP 24X41 OB/GYN DISP (PERSONAL CARE ITEMS) ×4 IMPLANT
SOLUTION CELL SAVER (CLIP) ×2 IMPLANT
STOCKINETTE STRL 4IN 9604848 (GAUZE/BANDAGES/DRESSINGS) ×4 IMPLANT
SUT GORETEX CV-6TTC-13 36IN (SUTURE) ×8 IMPLANT
SUT MNCRL AB 4-0 PS2 18 (SUTURE) ×4 IMPLANT
SUT PROLENE 6 0 BV (SUTURE) ×8 IMPLANT
SUT SILK 0 SH 30 (SUTURE) ×4 IMPLANT
SUT SILK 2 0 (SUTURE) ×2
SUT SILK 2-0 18XBRD TIE 12 (SUTURE) ×2 IMPLANT
SUT SILK 3 0 (SUTURE) ×2
SUT SILK 3-0 18XBRD TIE 12 (SUTURE) ×2 IMPLANT
SUT SILK 4 0 (SUTURE) ×2
SUT SILK 4-0 18XBRD TIE 12 (SUTURE) ×2 IMPLANT
SUT VIC AB 3-0 SH 27 (SUTURE) ×2
SUT VIC AB 3-0 SH 27X BRD (SUTURE) ×2 IMPLANT
SYR 20CC LL (SYRINGE) ×4 IMPLANT
SYR 3ML LL SCALE MARK (SYRINGE) ×4 IMPLANT
SYR TB 1ML 27GX1/2 LL (SYRINGE) IMPLANT
TOWEL OR 17X26 4PK STRL BLUE (TOWEL DISPOSABLE) IMPLANT

## 2015-08-26 NOTE — Anesthesia Preprocedure Evaluation (Signed)
Anesthesia Evaluation  Patient identified by MRN, date of birth, ID band Patient awake    Reviewed: Allergy & Precautions, NPO status , Patient's Chart, lab work & pertinent test results, reviewed documented beta blocker date and time   Airway Mallampati: II  TM Distance: >3 FB Neck ROM: Full    Dental  (+) Chipped   Pulmonary asthma , Current Smoker,    Pulmonary exam normal        Cardiovascular hypertension, Pt. on medications and Pt. on home beta blockers Normal cardiovascular exam+ dysrhythmias      Neuro/Psych negative neurological ROS  negative psych ROS   GI/Hepatic negative GI ROS, Neg liver ROS,   Endo/Other  diabetes, Well Controlled, Type 2, Oral Hypoglycemic Agents  Renal/GU ESRFRenal disease     Musculoskeletal negative musculoskeletal ROS (+)   Abdominal Normal abdominal exam  (+)   Peds negative pediatric ROS (+)  Hematology  (+) anemia ,   Anesthesia Other Findings   Reproductive/Obstetrics                             Anesthesia Physical Anesthesia Plan  ASA: III  Anesthesia Plan: General   Post-op Pain Management:    Induction: Intravenous  Airway Management Planned: Oral ETT  Additional Equipment:   Intra-op Plan:   Post-operative Plan: Extubation in OR  Informed Consent: I have reviewed the patients History and Physical, chart, labs and discussed the procedure including the risks, benefits and alternatives for the proposed anesthesia with the patient or authorized representative who has indicated his/her understanding and acceptance.   Dental advisory given  Plan Discussed with: CRNA and Surgeon  Anesthesia Plan Comments:         Anesthesia Quick Evaluation

## 2015-08-26 NOTE — Anesthesia Procedure Notes (Signed)
Procedure Name: Intubation Date/Time: 08/26/2015 11:30 AM Performed by: Silvana Newness Pre-anesthesia Checklist: Patient identified, Emergency Drugs available, Suction available, Patient being monitored and Timeout performed Patient Re-evaluated:Patient Re-evaluated prior to inductionOxygen Delivery Method: Circle system utilized Preoxygenation: Pre-oxygenation with 100% oxygen Intubation Type: IV induction Ventilation: Mask ventilation without difficulty Laryngoscope Size: Mac and 3 Grade View: Grade I Tube type: Oral Tube size: 7.0 mm Number of attempts: 1 Airway Equipment and Method: Rigid stylet Placement Confirmation: ETT inserted through vocal cords under direct vision,  positive ETCO2 and breath sounds checked- equal and bilateral Secured at: 22 cm Tube secured with: Tape Dental Injury: Teeth and Oropharynx as per pre-operative assessment

## 2015-08-26 NOTE — Discharge Instructions (Signed)
AV Fistula, Care After °Refer to this sheet in the next few weeks. These instructions provide you with information on caring for yourself after your procedure. Your caregiver may also give you more specific instructions. Your treatment has been planned according to current medical practices, but problems sometimes occur. Call your caregiver if you have any problems or questions after your procedure. °HOME CARE INSTRUCTIONS  °· Do not drive a car or take public transportation alone. °· Do not drink alcohol. °· Only take medicine that has been prescribed by your caregiver. °· Do not sign important papers or make important decisions. °· Have a responsible person with you. °· Ask your caregiver to show you how to check your access at home for a vibration (called a "thrill") or for a sound (called a "bruit" pronounced brew-ee). °· Your vein will need time to enlarge and mature so needles can be inserted for dialysis. Follow your caregiver's instructions about what you need to do to make this happen. °· Keep dressings clean and dry. °· Keep the arm elevated above your heart. Use a pillow. °· Rest. °· Use the arm as usual for all activities. °· Have the stitches or tape closures removed in 10 to 14 days, or as directed by your caregiver. °· Do not sleep or lie on the area of the fistula or that arm. This may decrease or stop the blood flow through your fistula. °· Do not allow blood pressures to be taken on this arm. °· Do not allow blood drawing to be done from the graft. °· Do not wear tight clothing around the access site or on the arm. °· Avoid lifting heavy objects with the arm that has the fistula. °· Do not use creams or lotions over the access site. °SEEK MEDICAL CARE IF:  °· You have a fever. °· You have swelling around the fistula that gets worse, or you have new pain. °· You have unusual bleeding at the fistula site or from any other area. °· You have pus or other drainage at the fistula site. °· You have skin  redness or red streaking on the skin around, above, or below the fistula site. °· Your access site feels warm. °· You have any flu-like symptoms. °SEEK IMMEDIATE MEDICAL CARE IF:  °· You have pain, numbness, or an unusual pale skin on the hand or on the side of your fistula. °· You have dizziness or weakness that you have not had before. °· You have shortness of breath. °· You have chest pain. °· Your fistula disconnects or breaks, and there is bleeding that cannot be easily controlled. °Call for local emergency medical help. Do not try to drive yourself to the hospital. °MAKE SURE YOU °· Understand these instructions. °· Will watch your condition. °· Will get help right away if you are not doing well or get worse. °  °This information is not intended to replace advice given to you by your health care provider. Make sure you discuss any questions you have with your health care provider. °  °Document Released: 01/17/2005 Document Revised: 02/07/2014 Document Reviewed: 07/07/2010 °Elsevier Interactive Patient Education ©2016 [employer] °AMBULATORY SURGERY  °DISCHARGE INSTRUCTIONS ° ° °1) The drugs that you were given will stay in your system until tomorrow so for the next 24 hours you should not: ° °A) Drive an automobile °B) Make any legal decisions °C) Drink any alcoholic beverage ° ° °2) You may resume regular meals tomorrow.  Today it is better to   start with liquids and gradually work up to solid foods.  You may eat anything you prefer, but it is better to start with liquids, then soup and crackers, and gradually work up to solid foods.   3) Please notify your doctor immediately if you have any unusual bleeding, trouble breathing, redness and pain at the surgery site, drainage, fever, or pain not relieved by medication.     Please contact your physician with any problems or Same Day Surgery at [phone], Monday through Friday 6 am to 4 pm, or [place] at [facility] number at [phone].

## 2015-08-26 NOTE — H&P (Signed)
  [facility] History & Physical Update  The patient was interviewed and re-examined.  The patient's previous History and Physical has been reviewed and is unchanged.  There is no change in the plan of care which is left arm AVG. We plan to proceed with the scheduled procedure. Heart RRR, no murmur Lungs, CTAB and equal   Belky Mundo, MD  08/26/2015, 11:01 AM

## 2015-08-26 NOTE — OR Nursing (Signed)
Patient arrived today and complains of back pain that she has had x 3 weeks.  OR staff and anesthesia aware.  Patient also has Hep B injection yesterday in operative arm, will notify Dr Lucky Cowboy of this.

## 2015-08-26 NOTE — OR Nursing (Signed)
Dr Kayleen Memos reviewed lab results. No new orders.

## 2015-08-26 NOTE — Transfer of Care (Signed)
Immediate Anesthesia Transfer of Care Note  Patient: [name]  Procedure(s) Performed: Procedure(s): ARTERIOVENOUS (AV) FISTULA CREATION  Patient Location: PACU  Anesthesia Type:General  Level of Consciousness: awake and sedated  Airway & Oxygen Therapy: Patient Spontanous Breathing and Patient connected to face mask oxygen  Post-op Assessment: Report given to RN and Post -op Vital signs reviewed and stable  Post vital signs: Reviewed and stable  Last Vitals:  Vitals:   08/26/15 1022 08/26/15 1251  BP:  (!) 153/73  Pulse: 90 97  Resp:  11  Temp:  36.2 C    Last Pain:  Vitals:   08/26/15 0557  TempSrc: Tympanic  PainSc: 9          Complications: No apparent anesthesia complications

## 2015-08-26 NOTE — OR Nursing (Signed)
Dr Kayleen Memos aware that patient has not taken her beta blocker in about 2 months no new orders at this time.

## 2015-08-26 NOTE — Op Note (Signed)
 VEIN AND VASCULAR SURGERY   OPERATIVE NOTE   PROCEDURE: Left brachiocephalic arteriovenous fistula placement  PRE-OPERATIVE DIAGNOSIS: 1.  ESRD      2. HTN  POST-OPERATIVE DIAGNOSIS: 1. ESRD     2. HTN  SURGEON: Leotis Pain, MD  ASSISTANT(S): none  ANESTHESIA: general  ESTIMATED BLOOD LOSS: 25 cc  FINDING(S): Adequate cephalic vein for fistula creation  SPECIMEN(S):  none  INDICATIONS:   [name] is a 30 y.o. female who presents with renal failure in need of pemanent dialysis acces.  The patient is scheduled for left arm AVF placement.  The patient is aware the risks include but are not limited to: bleeding, infection, steal syndrome, nerve damage, ischemic monomelic neuropathy, failure to mature, and need for additional procedures.  The patient is aware of the risks of the procedure and elects to proceed forward.  DESCRIPTION: After full informed written consent was obtained from the patient, the patient was brought back to the operating room and placed supine upon the operating table.  Prior to induction, the patient received IV antibiotics.   After obtaining adequate anesthesia, the patient was then prepped and draped in the standard fashion for a left arm access procedure.  I made a curvilinear incision at the level of the antecubital fossa and dissected through the subcutaneous tissue and fascia to gain exposure of the brachial artery.  This was noted to be patent and adequate in size for fistula creation.  This was dissected out proximally and distally and prepared for control with vessel loops .  I then dissected out the cephalic vein.  This was noted to be patent and adequate in size for fistula creation. This was a pleasant surprise as our preoperative vein mapping had not suggested that the cephalic vein would be adequate in size.  I then gave the patient 3000 units of intravenous heparin.  The vein was marked for orientation and the distal segment of the  vein was ligated with a  2-0 silk, and the vein was transected.  I then instilled the heparinized saline into the vein and clamped it.  At this point, I reset my exposure of the brachial artery and pulled up control on the vessel loops.  I made an arteriotomy with a #11 blade, and then I extended the arteriotomy with a Potts scissor.  I injected heparinized saline proximal and distal to this arteriotomy.  The vein was then sewn to the artery in an end-to-side configuration with a running stitch of 6-0 Prolene.  Prior to completing this anastomosis, I allowed the vein and artery to backbleed.  There was no evidence of clot from any vessels.  I completed the anastomosis in the usual fashion and then released all vessel loops and clamps.  There was a palpable  thrill in the venous outflow, and there was a palpable pulse in the artery distal to the anastomosis.  At this point, I irrigated out the surgical wound.  Surgicel was placed. There was no further active bleeding.  The subcutaneous tissue was reapproximated with a running stitch of 3-0 Vicryl.  The skin was then closed with a 4-0 Monocryl suture.  The skin was then cleaned, dried, and reinforced with Dermabond.  The patient tolerated this procedure well and was taken to the recovery room in stable condition  COMPLICATIONS: None  CONDITION: Stable   DEW,JASON    08/26/2015, 12:44 PM

## 2015-08-27 NOTE — Anesthesia Postprocedure Evaluation (Signed)
Anesthesia Post Note  Patient: [name]  Procedure(s) Performed: Procedure(s): ARTERIOVENOUS (AV) FISTULA CREATION  Patient location during evaluation: PACU Anesthesia Type: General Level of consciousness: awake and alert and oriented Pain management: pain level controlled Vital Signs Assessment: post-procedure vital signs reviewed and stable Respiratory status: spontaneous breathing Cardiovascular status: blood pressure returned to baseline Anesthetic complications: no    Last Vitals:  Vitals:   08/26/15 1349 08/26/15 1430  BP: (!) 165/89 (!) 169/99  Pulse: 86 86  Resp: 16 16  Temp:      Last Pain:  Vitals:   08/26/15 1430  TempSrc:   PainSc: 5                  Sundy Houchins

## 2015-08-28 ENCOUNTER — Encounter: Payer: Self-pay | Admitting: Vascular Surgery

## 2015-09-01 ENCOUNTER — Other Ambulatory Visit: Payer: Self-pay | Admitting: Vascular Surgery

## 2015-09-01 ENCOUNTER — Ambulatory Visit
Admission: RE | Admit: 2015-09-01 | Discharge: 2015-09-01 | Disposition: A | Discharge status: Home / Self Care | Payer: Medicare Other | Source: Ambulatory Visit | Attending: Vascular Surgery | Admitting: Vascular Surgery

## 2015-09-01 DIAGNOSIS — Z992 Dependence on renal dialysis: Secondary | ICD-10-CM | POA: Insufficient documentation

## 2015-09-01 DIAGNOSIS — T82898A Other specified complication of vascular prosthetic devices, implants and grafts, initial encounter: Secondary | ICD-10-CM | POA: Insufficient documentation

## 2015-09-01 DIAGNOSIS — N186 End stage renal disease: Secondary | ICD-10-CM | POA: Diagnosis not present

## 2015-09-01 DIAGNOSIS — Y832 Surgical operation with anastomosis, bypass or graft as the cause of abnormal reaction of the patient, or of later complication, without mention of misadventure at the time of the procedure: Secondary | ICD-10-CM | POA: Insufficient documentation

## 2015-09-01 MED ORDER — SODIUM CHLORIDE 0.9 % IV SOLN
5.0000 mg | Freq: Once | INTRAVENOUS | Status: DC
  Administered 2015-09-01: 5 mg via INTRAVENOUS
  Filled 2015-09-01: qty 5

## 2015-09-01 MED ORDER — SODIUM CHLORIDE 0.9 % IV SOLN
5.0000 mg | Freq: Once | INTRAVENOUS | Status: DC
  Filled 2015-09-01: qty 5

## 2015-09-01 MED ORDER — ONDANSETRON HCL 4 MG/2ML IJ SOLN: 4.0000 mg | Freq: Four times a day (QID) | INTRAMUSCULAR | Status: DC | PRN

## 2015-09-01 MED ORDER — FAMOTIDINE 20 MG PO TABS: 40.0000 mg | ORAL_TABLET | ORAL | Status: DC | PRN

## 2015-09-01 MED ORDER — HYDROMORPHONE HCL 1 MG/ML IJ SOLN: 1.0000 mg | Freq: Once | INTRAMUSCULAR | Status: DC

## 2015-09-01 MED ORDER — SODIUM CHLORIDE 0.9 % IV SOLN: INTRAVENOUS | Status: DC

## 2015-09-01 MED ORDER — METHYLPREDNISOLONE SODIUM SUCC 125 MG IJ SOLR: 125.0000 mg | INTRAMUSCULAR | Status: DC | PRN

## 2015-09-01 NOTE — Op Note (Signed)
OPERATIVE NOTE   PROCEDURE: 1. Infusion of TPA for clearance of tunneled dialysis catheter  PRE-OPERATIVE DIAGNOSIS: Complication of tunneled dialysis catheter with poor flow, and stage renal disease requiring hemodialysis  POST-OPERATIVE DIAGNOSIS: Same  SURGEON: Katha Cabal, MD  ANESTHESIA: None  ESTIMATED BLOOD LOSS: Minimal cc  FINDING(S): 1. Attempts at aspirating both lumens are unsuccessful prior to infusion. This suggests poor flow and inadequate situation for continued dialysis.  SPECIMEN(S):  None  INDICATIONS:   [name] is a 30 y.o. female who presents with a poorly functioning tunneled dialysis catheter.  DESCRIPTION: After obtaining full informed written consent, the patient was brought to special procedures holding area and positioned supine on a gurney.   Under sterile technique were catheter function is verified. 2 mg of alteplase is reconstituted in 50 cc a total of 2 aliquots are made. Subtotally infusion through both lumens of the dialysis catheter is performed over 2-1/2 hour time period. Infusion is simultaneous.  Following completion of the infusion again using sterile technique both lumens are aspirated and suctioned with flushed with sterile saline. Aspiration demonstrates excellent flow from both lumens. Both lumens are then packed with a total of 5000 units of heparin per lumen reconstituted in the indicated volume based on the patient's catheter. Sterile dressing is reapplied..  COMPLICATIONS: None  CONDITION: Good  Katha Cabal M.D. [facility] Office: [phone]   09/01/2015, 3:42 PM

## 2015-11-09 ENCOUNTER — Emergency Department
Admission: EM | Admit: 2015-11-09 | Discharge: 2015-11-09 | Disposition: A | Discharge status: Home / Self Care | Payer: Medicare Other | Attending: Emergency Medicine | Admitting: Emergency Medicine

## 2015-11-09 DIAGNOSIS — K0889 Other specified disorders of teeth and supporting structures: Secondary | ICD-10-CM | POA: Insufficient documentation

## 2015-11-09 DIAGNOSIS — Z992 Dependence on renal dialysis: Secondary | ICD-10-CM | POA: Diagnosis not present

## 2015-11-09 DIAGNOSIS — E1122 Type 2 diabetes mellitus with diabetic chronic kidney disease: Secondary | ICD-10-CM | POA: Diagnosis not present

## 2015-11-09 DIAGNOSIS — J45909 Unspecified asthma, uncomplicated: Secondary | ICD-10-CM | POA: Diagnosis not present

## 2015-11-09 DIAGNOSIS — I120 Hypertensive chronic kidney disease with stage 5 chronic kidney disease or end stage renal disease: Secondary | ICD-10-CM | POA: Insufficient documentation

## 2015-11-09 DIAGNOSIS — Z794 Long term (current) use of insulin: Secondary | ICD-10-CM | POA: Diagnosis not present

## 2015-11-09 DIAGNOSIS — Z79899 Other long term (current) drug therapy: Secondary | ICD-10-CM | POA: Insufficient documentation

## 2015-11-09 DIAGNOSIS — Z7982 Long term (current) use of aspirin: Secondary | ICD-10-CM | POA: Diagnosis not present

## 2015-11-09 DIAGNOSIS — N186 End stage renal disease: Secondary | ICD-10-CM | POA: Insufficient documentation

## 2015-11-09 LAB — CBC WITH DIFFERENTIAL/PLATELET
BASOS ABS: 0.1 10*3/uL (ref 0–0.1)
Basophils Relative: 1 %
EOS PCT: 6 %
Eosinophils Absolute: 0.5 10*3/uL (ref 0–0.7)
HCT: 36.8 % (ref 35.0–47.0)
HEMOGLOBIN: 12.6 g/dL (ref 12.0–16.0)
LYMPHS PCT: 43 %
Lymphs Abs: 4.1 10*3/uL — ABNORMAL HIGH (ref 1.0–3.6)
MCH: 29.4 pg (ref 26.0–34.0)
MCHC: 34.1 g/dL (ref 32.0–36.0)
MCV: 86.1 fL (ref 80.0–100.0)
Monocytes Absolute: 0.6 10*3/uL (ref 0.2–0.9)
Monocytes Relative: 7 %
NEUTROS PCT: 43 %
Neutro Abs: 4 10*3/uL (ref 1.4–6.5)
PLATELETS: 184 10*3/uL (ref 150–440)
RBC: 4.27 MIL/uL (ref 3.80–5.20)
RDW: 17.5 % — ABNORMAL HIGH (ref 11.5–14.5)
WBC: 9.3 10*3/uL (ref 3.6–11.0)

## 2015-11-09 LAB — BASIC METABOLIC PANEL
ANION GAP: 11 (ref 5–15)
BUN: 47 mg/dL — ABNORMAL HIGH (ref 6–20)
CHLORIDE: 103 mmol/L (ref 101–111)
CO2: 24 mmol/L (ref 22–32)
Calcium: 9.6 mg/dL (ref 8.9–10.3)
Creatinine, Ser: 12.3 mg/dL — ABNORMAL HIGH (ref 0.44–1.00)
GFR calc Af Amer: 4 mL/min — ABNORMAL LOW (ref >60)
GFR, EST NON AFRICAN AMERICAN: 4 mL/min — AB (ref >60)
Glucose, Bld: 103 mg/dL — ABNORMAL HIGH (ref 65–99)
POTASSIUM: 4.8 mmol/L (ref 3.5–5.1)
SODIUM: 138 mmol/L (ref 135–145)

## 2015-11-09 MED ORDER — OXYCODONE-ACETAMINOPHEN 5-325 MG PO TABS: 1.0000 | ORAL_TABLET | Freq: Four times a day (QID) | ORAL | 0 refills | Status: DC | PRN

## 2015-11-09 MED ORDER — OXYCODONE-ACETAMINOPHEN 5-325 MG PO TABS
ORAL_TABLET | ORAL | Status: AC
  Administered 2015-11-09: 1 via ORAL
  Filled 2015-11-09: qty 1

## 2015-11-09 MED ORDER — OXYCODONE-ACETAMINOPHEN 5-325 MG PO TABS
1.0000 | ORAL_TABLET | Freq: Once | ORAL | Status: AC
  Administered 2015-11-09: 1 via ORAL

## 2015-11-09 MED ORDER — CLINDAMYCIN HCL 150 MG PO CAPS
450.0000 mg | ORAL_CAPSULE | Freq: Once | ORAL | Status: AC
  Administered 2015-11-09: 450 mg via ORAL
  Filled 2015-11-09: qty 3

## 2015-11-09 MED ORDER — CLINDAMYCIN HCL 150 MG PO CAPS: 450.0000 mg | ORAL_CAPSULE | Freq: Three times a day (TID) | ORAL | 0 refills | Status: DC

## 2015-11-09 NOTE — ED Triage Notes (Signed)
Patient reports dental pain to right lower jaw.  Patient reports having a "hole in my tooth for awhile".  States pain began yesterday but has gotten worse.

## 2015-11-09 NOTE — ED Notes (Signed)
Dr. Clearnce Hasten at pt's bedside.

## 2015-11-09 NOTE — ED Notes (Addendum)
Pt stating that her right lower tooth is causing her pain. Pt stating pain started last night and progressively has worsened. Pt is currently moaning and crying.

## 2015-11-09 NOTE — ED Notes (Signed)
Pt given cup of ice and grape juice per request.

## 2015-11-09 NOTE — ED Provider Notes (Signed)
[facility] Emergency Department Provider Note  ____________________________________________   First MD Initiated Contact with Patient 11/09/15 [phone]     (approximate)  I have reviewed the triage vital signs and the nursing notes.   HISTORY  Chief Complaint Dental Problem   HPI [name] is a 30 y.o. female with a history of end-stage renal disease on dialysis was presenting to the emergency department today with right sided dental pain. She says that over the past week she has had pain to the right lower and posterior most molar. She denies fever. Does not report any swelling. Says that she has never been to a dentist in her life. Describes the pain as a 10 out of 10.  Also says that she is on dialysis but missed her dialysis session this past Saturday. She is a Tuesday there is a Saturday schedule.  Past Medical History:  Diagnosis Date  . Asthma   . Diabetes mellitus   . Dysrhythmia   . 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 Hypertension   . Irregular heart beat   . Morbid obesity ([place])   . Renal insufficiency     Patient Active Problem List   Diagnosis Date Noted  . Complication from renal dialysis device 07/07/2014  . End stage renal disease ([place]) 12/29/2010    Past Surgical History:  Procedure Laterality Date  . AV FISTULA PLACEMENT  08/26/10   Left Radiocephalic AVF  . AV FISTULA PLACEMENT  08/26/2015   Procedure: ARTERIOVENOUS (AV) FISTULA CREATION;  Surgeon: Algernon Huxley, MD;  Location: [facility] ORS;  Service: Vascular;;  . EYE SURGERY     laser photocoagulation  . EYE SURGERY Right 2008   removal of blood clot  . INTRAUTERINE DEVICE (IUD) INSERTION    . PERIPHERAL VASCULAR CATHETERIZATION N/A 07/07/2014   Procedure: A/V Shuntogram/Fistulagram;  Surgeon: Algernon Huxley, MD;  Location: [place] [facility];  Service: Cardiovascular;  Laterality: N/A;  . PERIPHERAL VASCULAR CATHETERIZATION N/A 07/07/2014   Procedure: A/V Shunt  Intervention;  Surgeon: Algernon Huxley, MD;  Location: [place] [facility];  Service: Cardiovascular;  Laterality: N/A;  . PERIPHERAL VASCULAR CATHETERIZATION Right 01/12/2015   Procedure: A/V Shuntogram/Fistulagram;  Surgeon: Algernon Huxley, MD;  Location: [place] [facility];  Service: Cardiovascular;  Laterality: Right;  . PERIPHERAL VASCULAR CATHETERIZATION N/A 01/12/2015   Procedure: A/V Shunt Intervention;  Surgeon: Algernon Huxley, MD;  Location: [place] [facility];  Service: Cardiovascular;  Laterality: N/A;  . PERIPHERAL VASCULAR CATHETERIZATION Right 06/08/2015   Procedure: A/V Shuntogram/Fistulagram;  Surgeon: Algernon Huxley, MD;  Location: [place] [facility];  Service: Cardiovascular;  Laterality: Right;  . PERIPHERAL VASCULAR CATHETERIZATION N/A 06/08/2015   Procedure: A/V Shunt Intervention;  Surgeon: Algernon Huxley, MD;  Location: [place] [facility];  Service: Cardiovascular;  Laterality: N/A;  . PERIPHERAL VASCULAR CATHETERIZATION Right 07/14/2015   Procedure: Thrombectomy;  Surgeon: Katha Cabal, MD;  Location: [place] [facility];  Service: Cardiovascular;  Laterality: Right;  . PERIPHERAL VASCULAR CATHETERIZATION N/A 07/14/2015   Procedure: A/V Shuntogram/Fistulagram;  Surgeon: Katha Cabal, MD;  Location: [place] [facility];  Service: Cardiovascular;  Laterality: N/A;  . PERIPHERAL VASCULAR CATHETERIZATION N/A 07/17/2015   Procedure: Thrombectomy;  Surgeon: Katha Cabal, MD;  Location: [place] [facility];  Service: Cardiovascular;  Laterality: N/A;  . PERIPHERAL VASCULAR CATHETERIZATION N/A 07/17/2015   Procedure: Dialysis/Perma Catheter Insertion;  Surgeon: Katha Cabal, MD;  Location: [place] [facility];  Service: Cardiovascular;  Laterality: N/A;  . PERIPHERAL VASCULAR CATHETERIZATION N/A 08/05/2015   Procedure: Thrombectomy;  Surgeon: Katha Cabal, MD;  Location: [place] [facility];  Service: Cardiovascular;  Laterality: N/A;  declot of graft    Prior to  Admission medications   Medication Sig Start Date End Date Taking? Authorizing Provider  aspirin 81 MG tablet Take 81 mg by mouth daily.      Historical Provider, MD  b complex-vitamin c-folic acid (NEPHRO-VITE) 0.8 MG TABS Take 0.8 mg by mouth at bedtime.      Historical Provider, MD  cinacalcet (SENSIPAR) 30 MG tablet Take 30 mg by mouth daily with supper.    Historical Provider, MD  cyclobenzaprine (FLEXERIL) 10 MG tablet Take 1 tablet (10 mg total) by mouth 3 (three) times daily as needed for muscle spasms. 12/05/14   Charline Bills Cuthriell, PA-C  darbepoetin (ARANESP, ALB FREE, SURECLICK) 128 NOM/7.6HM SOLN Inject 200 mcg into the skin every 7 (seven) days. Reported on 07/14/2015    Historical Provider, MD  diazepam (VALIUM) 2 MG tablet Take 1 tablet (2 mg total) by mouth every 8 (eight) hours as needed for muscle spasms. 11/01/14   Johnn Hai, PA-C  Ferric Citrate (AURYXIA) 1 GM 210 MG(Fe) TABS Take 210 mg by mouth 3 (three) times daily with meals.     Historical Provider, MD  gabapentin (NEURONTIN) 300 MG capsule Take 300 mg by mouth daily.    Historical Provider, MD  insulin glargine (LANTUS) 100 UNIT/ML injection Inject 6 Units into the skin at bedtime.     Historical Provider, MD  lisinopril (PRINIVIL,ZESTRIL) 20 MG tablet Take 20 mg by mouth 3 (three) times a week. Non-dialysis days    Historical Provider, MD  metoprolol succinate (TOPROL-XL) 100 MG 24 hr tablet Take 100 mg by mouth daily. Reported on 06/08/2015    Historical Provider, MD  omeprazole (PRILOSEC) 20 MG capsule Take 20 mg by mouth daily.    Historical Provider, MD  oxyCODONE (ROXICODONE) 5 MG/5ML solution Take 5 mg by mouth every 4 (four) hours as needed for severe pain.    Historical Provider, MD  oxyCODONE-acetaminophen (PERCOCET) 5-325 MG tablet Take 1-2 tablets by mouth every 4 (four) hours as needed for severe pain. 08/26/15   Algernon Huxley, MD  pravastatin (PRAVACHOL) 40 MG tablet Take 40 mg by mouth daily.    Historical  Provider, MD  promethazine (PHENERGAN) 12.5 MG tablet Take 12.5 mg by mouth every 6 (six) hours as needed for nausea or vomiting.    Historical Provider, MD    Allergies Marcillin [ampicillin]; Latex; and Vancomycin  No family history on file.  Social History Social History  Substance Use Topics  . Smoking status: Light Tobacco Smoker    Packs/day: 0.00    Years: 10.00    Types: Cigarettes  . Smokeless tobacco: Never Used  . Alcohol use No    Review of Systems Constitutional: No fever/chills Eyes: No visual changes. ENT: No sore throat. Cardiovascular: Denies chest pain. Respiratory: Denies shortness of breath. Gastrointestinal: No abdominal pain.  No nausea, no vomiting.  No diarrhea.  No constipation. Genitourinary: Negative for dysuria. Musculoskeletal: Negative for back pain. Skin: Negative for rash. Neurological: Negative for headaches, focal weakness or numbness.  10-point ROS otherwise negative.  ____________________________________________   PHYSICAL EXAM:  VITAL SIGNS: ED Triage Vitals  Enc Vitals Group     BP 11/09/15 0159 (!) 149/87     Pulse  Rate 11/09/15 0159 (!) 101     Resp 11/09/15 0159 (!) 22     Temp 11/09/15 0159 98.5 F (36.9 C)     Temp Source 11/09/15 0159 Oral     SpO2 11/09/15 0159 100 %     Weight 11/09/15 0158 215 lb (97.5 kg)     Height 11/09/15 0158 5\' 4"  (1.626 m)     Head Circumference --      Peak Flow --      Pain Score 11/09/15 0319 10     Pain Loc --      Pain Edu? --      Excl. in [place]? --     Constitutional: Alert and oriented. Well appearing and in no acute distress. Eyes: Conjunctivae are normal. PERRL. EOMI. Head: Atraumatic. Nose: No congestion/rhinnorhea. Mouth/Throat: Mucous membranes are moist.  Oropharynx non-erythematous.  Right posterior most molar with erosion. No surrounding swelling or abscess. No pus or fluctuance surrounding the base of the tooth. No trismus. No brawny edema to the floor of the mouth. No  submandibular swelling. Tenderness over the right posterior most molar as well. No loose teeth. Neck: No stridor.   Cardiovascular: Normal rate, regular rhythm. Grossly normal heart sounds. Permacath to the right side of the chest with clean and dry and intact dressings. Respiratory: Normal respiratory effort.  No retractions. Lungs CTAB. Gastrointestinal: Soft and nontender. No distention. Musculoskeletal: No lower extremity tenderness nor edema.  No joint effusions. Neurologic:  Normal speech and language. No gross focal neurologic deficits are appreciated.  Skin:  Skin is warm, dry and intact. No rash noted. Psychiatric: Mood and affect are normal. Speech and behavior are normal.  ____________________________________________   LABS (all labs ordered are listed, but only abnormal results are displayed)  Labs Reviewed  CBC WITH DIFFERENTIAL/PLATELET - Abnormal; Notable for the following:       Result Value   RDW 17.5 (*)    Lymphs Abs 4.1 (*)    All other components within normal limits  BASIC METABOLIC PANEL - Abnormal; Notable for the following:    Glucose, Bld 103 (*)    BUN 47 (*)    Creatinine, Ser 12.30 (*)    GFR calc non Af Amer 4 (*)    GFR calc Af Amer 4 (*)    All other components within normal limits   ____________________________________________  EKG   ____________________________________________  RADIOLOGY   ____________________________________________   PROCEDURES  Procedure(s) performed:   Procedures  Critical Care performed:   ____________________________________________   INITIAL IMPRESSION / ASSESSMENT AND PLAN / ED COURSE  Pertinent labs & imaging results that were available during my care of the patient were reviewed by me and considered in my medical decision making (see chart for details).  ----------------------------------------- 5:02 AM on 11/09/2015 -----------------------------------------  Pain relieved with Percocet. Advised  patient to call dialysis center in [facility] tomorrow for make an appointment. Electrolytes appear satisfactory for discharge. Will give penicillin as well as follow-up contacts for dental. Patient understands the plan and is willing to comply.  Clinical Course     ____________________________________________   FINAL CLINICAL IMPRESSION(S) / ED DIAGNOSES  Dental pain.    NEW MEDICATIONS STARTED DURING THIS VISIT:  New Prescriptions   No medications on file     Note:  This document was prepared using Dragon voice recognition software and may include unintentional dictation errors.    Orbie Pyo, MD 11/09/15 [phone]

## 2015-11-09 NOTE — ED Notes (Signed)
Pt given a warm blanket and ice for face. Pt appears to be more comfortable at this time.

## 2015-11-09 NOTE — ED Notes (Signed)
Pt stating that she can not have sticks in left arm. Pt stating that right arm is okay for IV sticks.

## 2015-11-18 ENCOUNTER — Encounter (INDEPENDENT_AMBULATORY_CARE_PROVIDER_SITE_OTHER): Payer: Self-pay

## 2015-11-18 ENCOUNTER — Other Ambulatory Visit (INDEPENDENT_AMBULATORY_CARE_PROVIDER_SITE_OTHER): Payer: Self-pay | Admitting: Vascular Surgery

## 2015-11-20 ENCOUNTER — Encounter (INDEPENDENT_AMBULATORY_CARE_PROVIDER_SITE_OTHER): Payer: Self-pay

## 2015-11-23 IMAGING — CR DG CHEST 2V
1 series · 2 of 2 positions shown · non-contrast
Comparison: 03/23/2011

CLINICAL DATA: Cough, congestion. Dialysis patient with pain at the
AV fistula site.

EXAM:
CHEST  2 VIEW

[Series 1: w chest pa · 0.14mm/px · 2 of 2 slices shown]
[im 1/2]
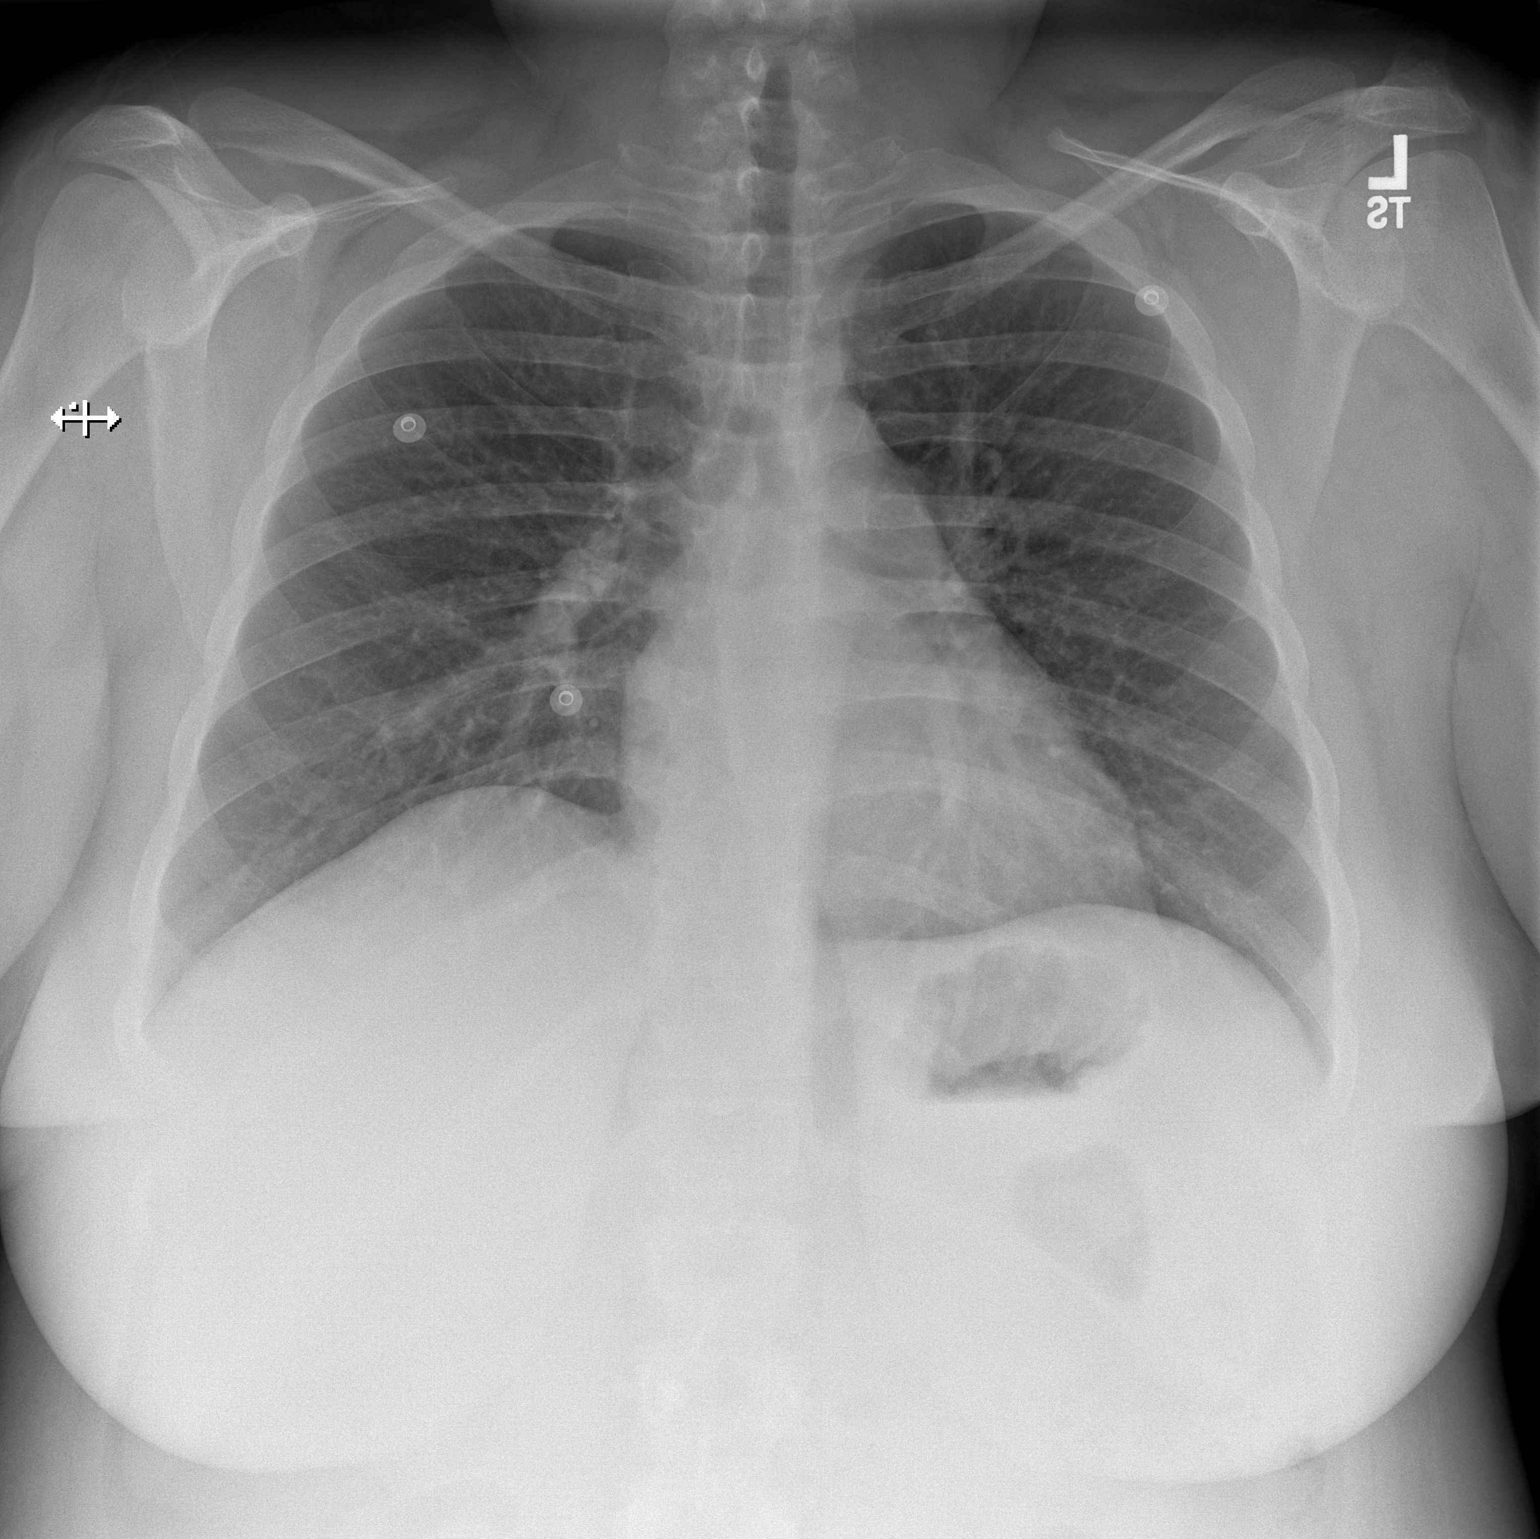
[im 2/2]
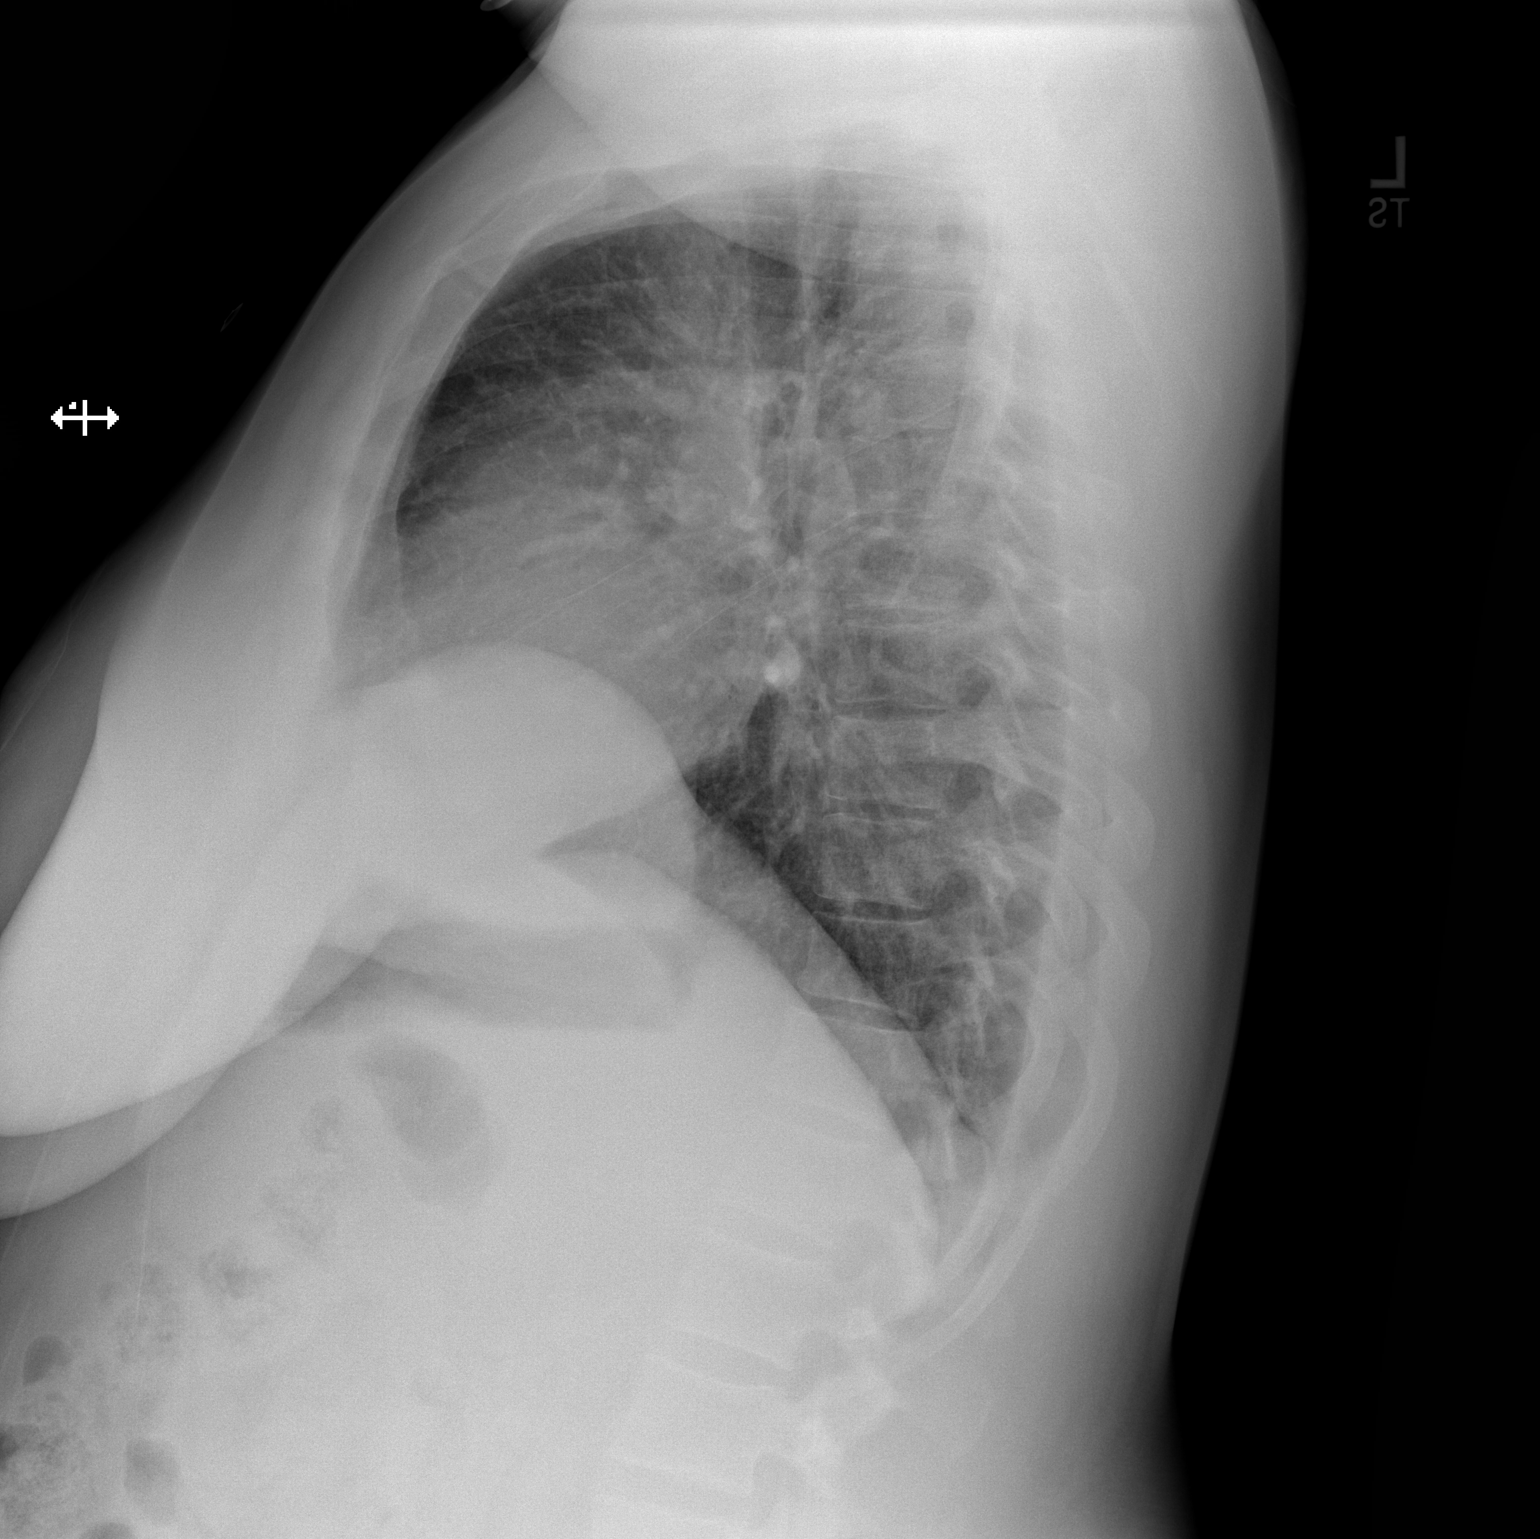

[2 of 2 positions shown; findings below may reference images not displayed]

FINDINGS: Hypoaeration with interstitial and vascular crowding. No confluent
airspace opacity, pleural effusion, or pneumothorax. Heart size
upper normal to the mildly prominent. Mediastinal contours otherwise
within normal range. No acute osseous finding.
IMPRESSION: Heart size upper normal.

Hypoaeration with interstitial and vascular crowding. Mild
interstitial edema or atypical/ viral infection not excluded. No
focal consolidation.

## 2015-11-23 IMAGING — US US EXTREM NON VASC*L* COMPLETE
2 series · 14 of 18 positions shown · non-contrast
Comparison: None

CLINICAL DATA: Pain over the AV fistula.

EXAM:
US EXTREM NON VASC*L* COMPLETE
TECHNIQUE: Limited focused ultrasound of the patient's left upper extremity AV
dialysis fistula. Grayscale and color Doppler sonographic images
were obtained. All

[Series 1: us extrem non vasc*left* complete · 0.06mm/px · 16 acquisitions, 12 frames shown (1 of 2)]
[im 1/16]
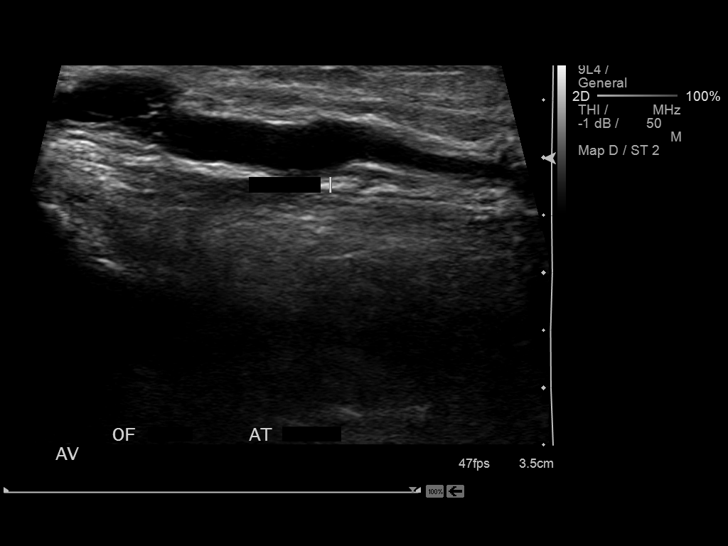
[im 2/16]
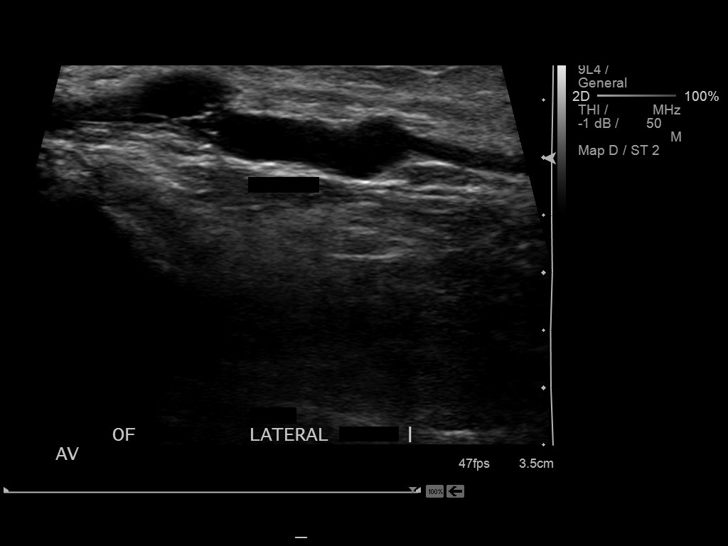
[im 4/16]
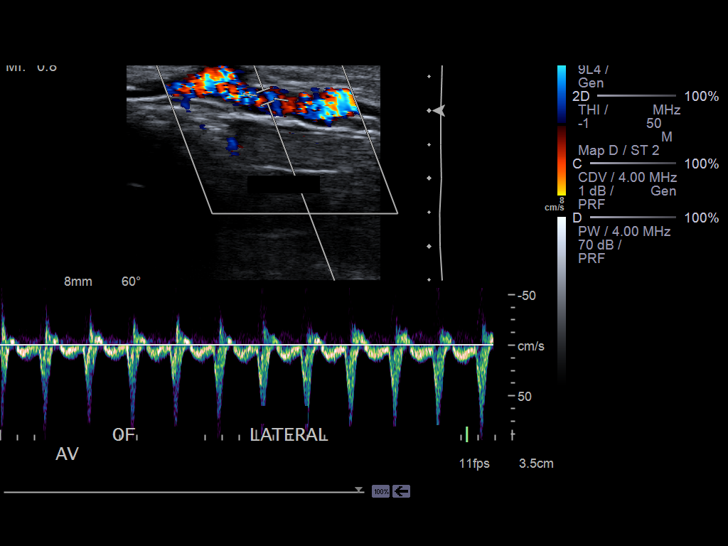
[im 5/16]
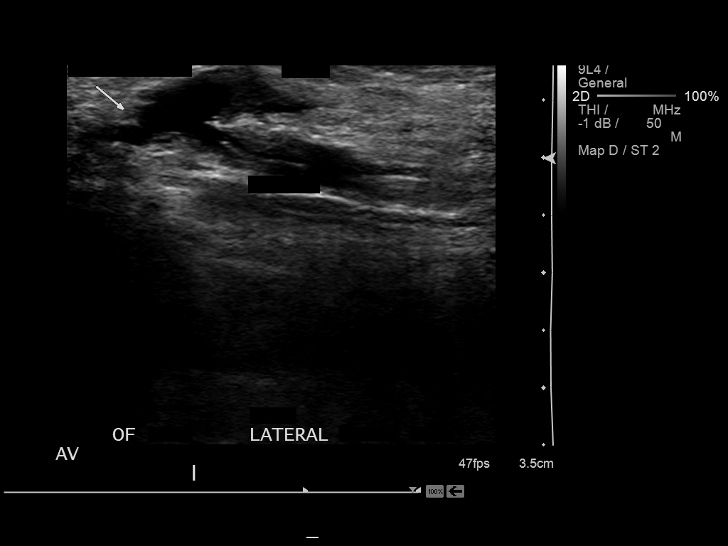
[im 6/16]
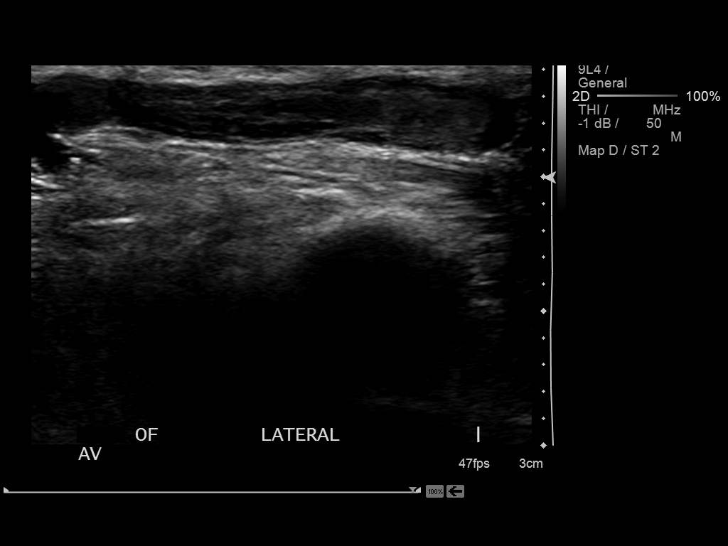
[im 8/16]
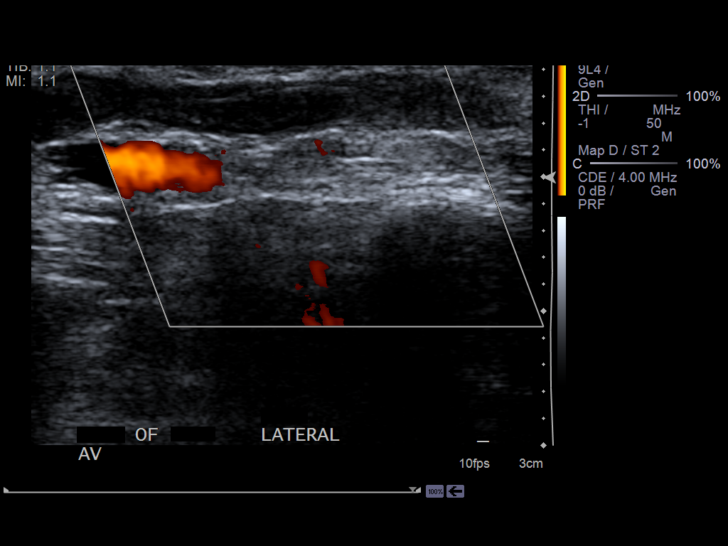
[im 9/16]
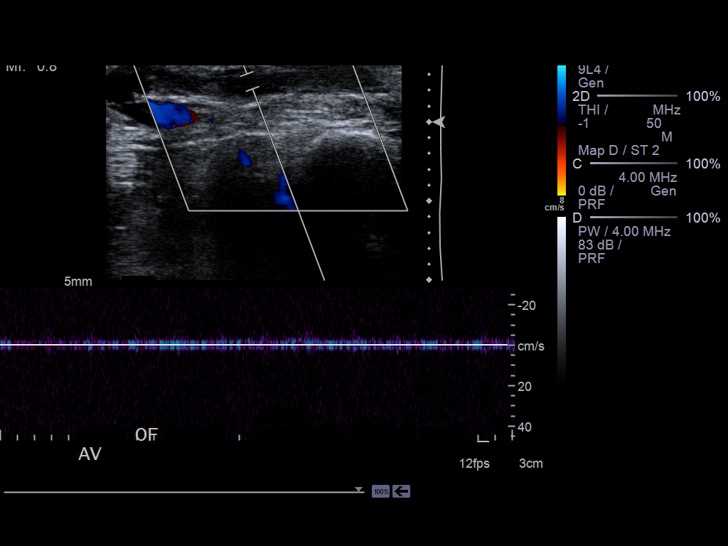
[im 10/16]
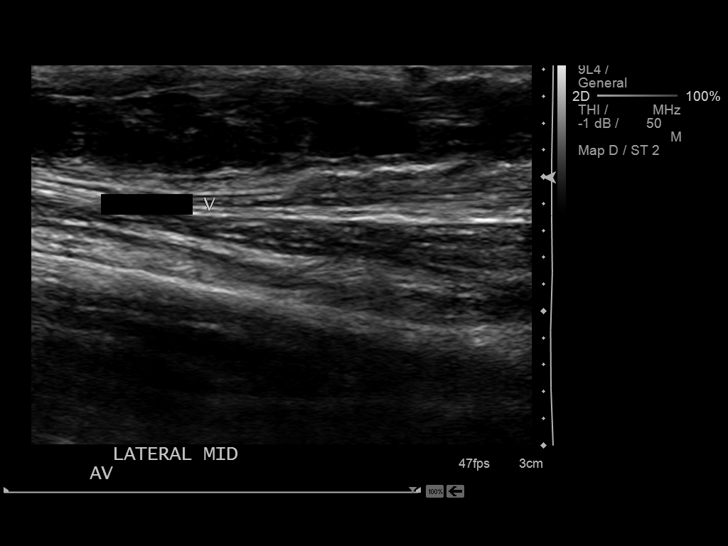
[im 11/16]
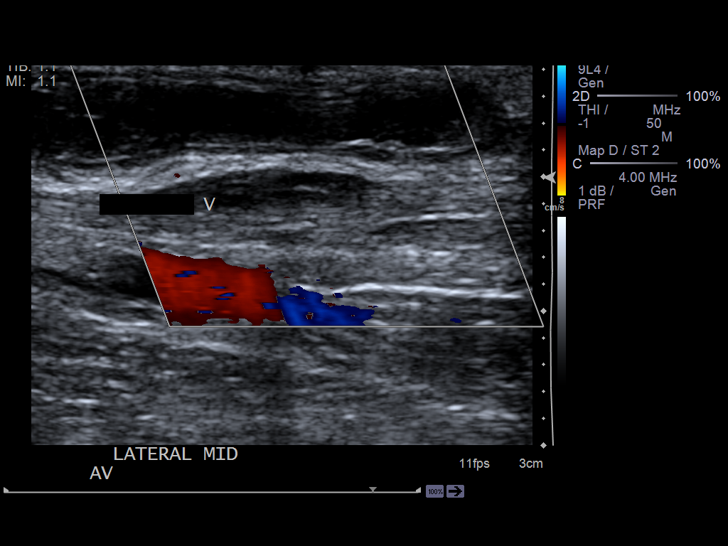
[im 13/16]
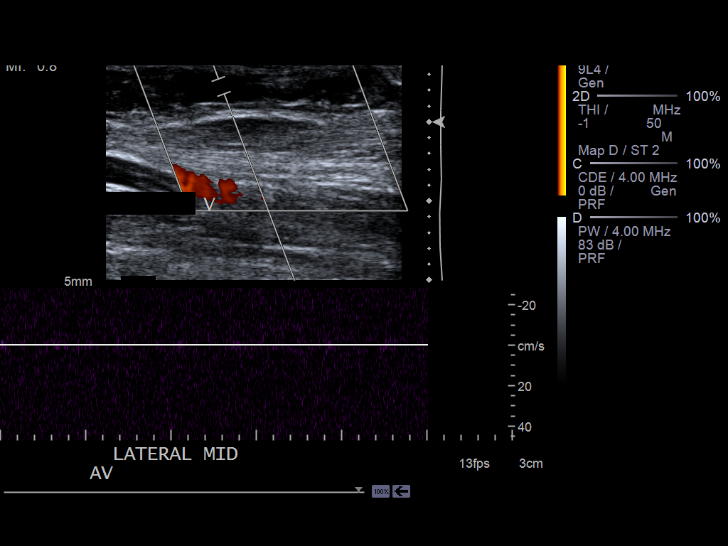
[im 14/16]
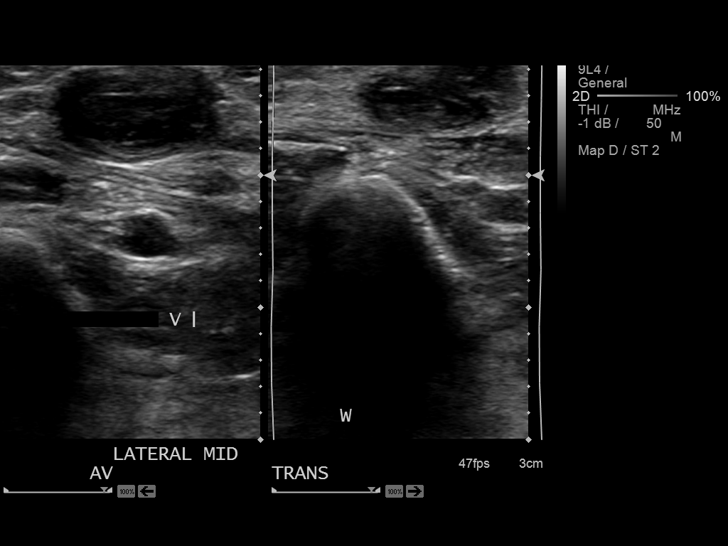
[im 15/16]
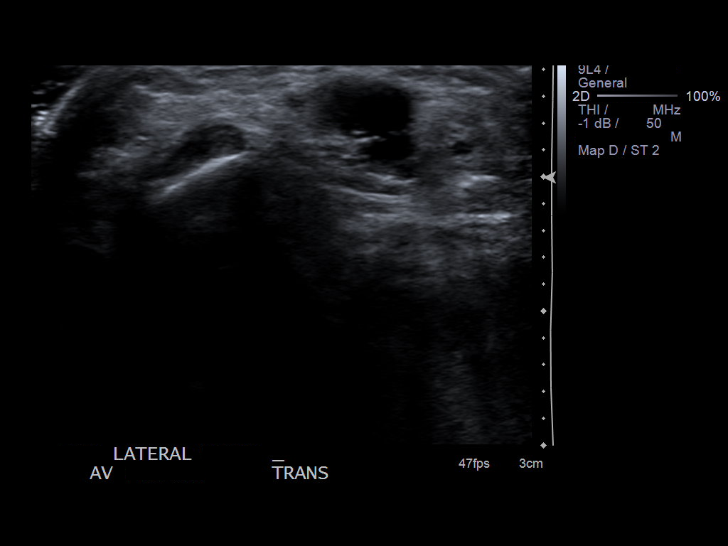

[Series 2: us extrem non vasc*left* complete · 0.05mm/px · 2 of 2 slices shown (2 of 2)]
[im 1/2]
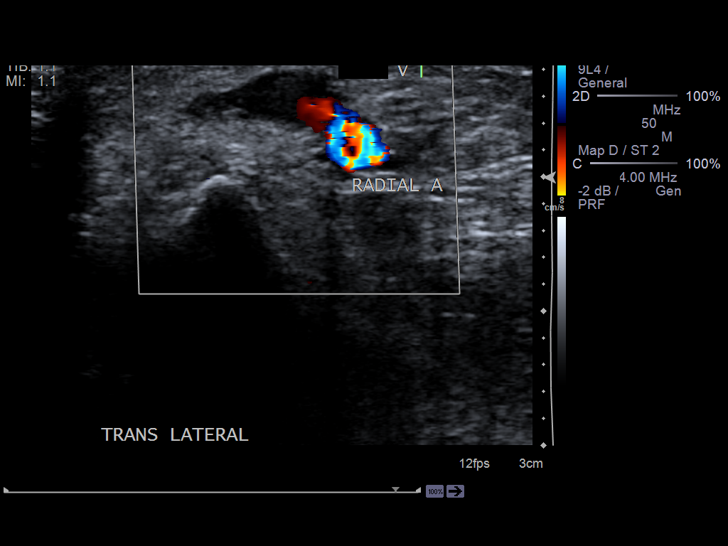
[im 2/2]
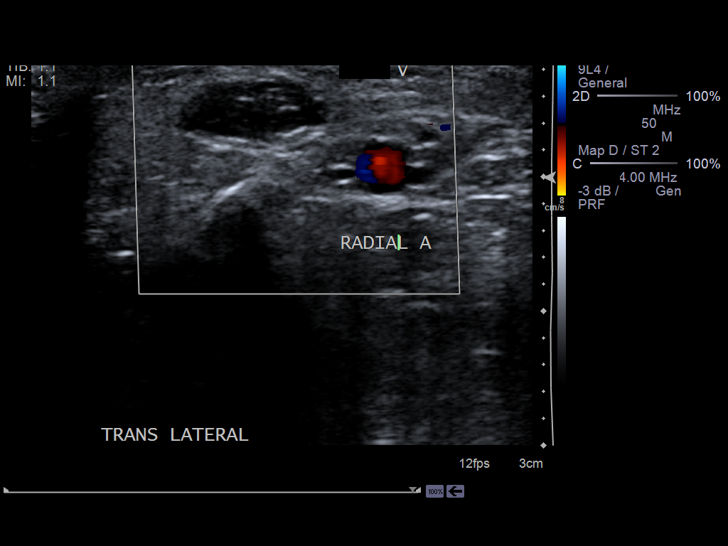

[14 of 18 positions shown; findings below may reference images not displayed]

FINDINGS: There is an AV fistula which appears to communicate between the
radial artery and cephalic vein at the wrist. The radial artery and
arterial end of the fistula appear patent. The cephalic vein is
thrombosed at the anastomosis, within the wrist, and forearm.
IMPRESSION: Hemodialysis arteriovenous fistula with complete thrombosis of the
draining vein.

## 2015-11-24 IMAGING — CR DG CHEST 2V
1 series · 2 of 2 positions shown · non-contrast
Comparison: 02/28/2013

CLINICAL DATA: Sepsis

EXAM:
CHEST  2 VIEW

[Series 3: w chest pa · 0.14mm/px · 2 of 2 slices shown]
[im 1/2]
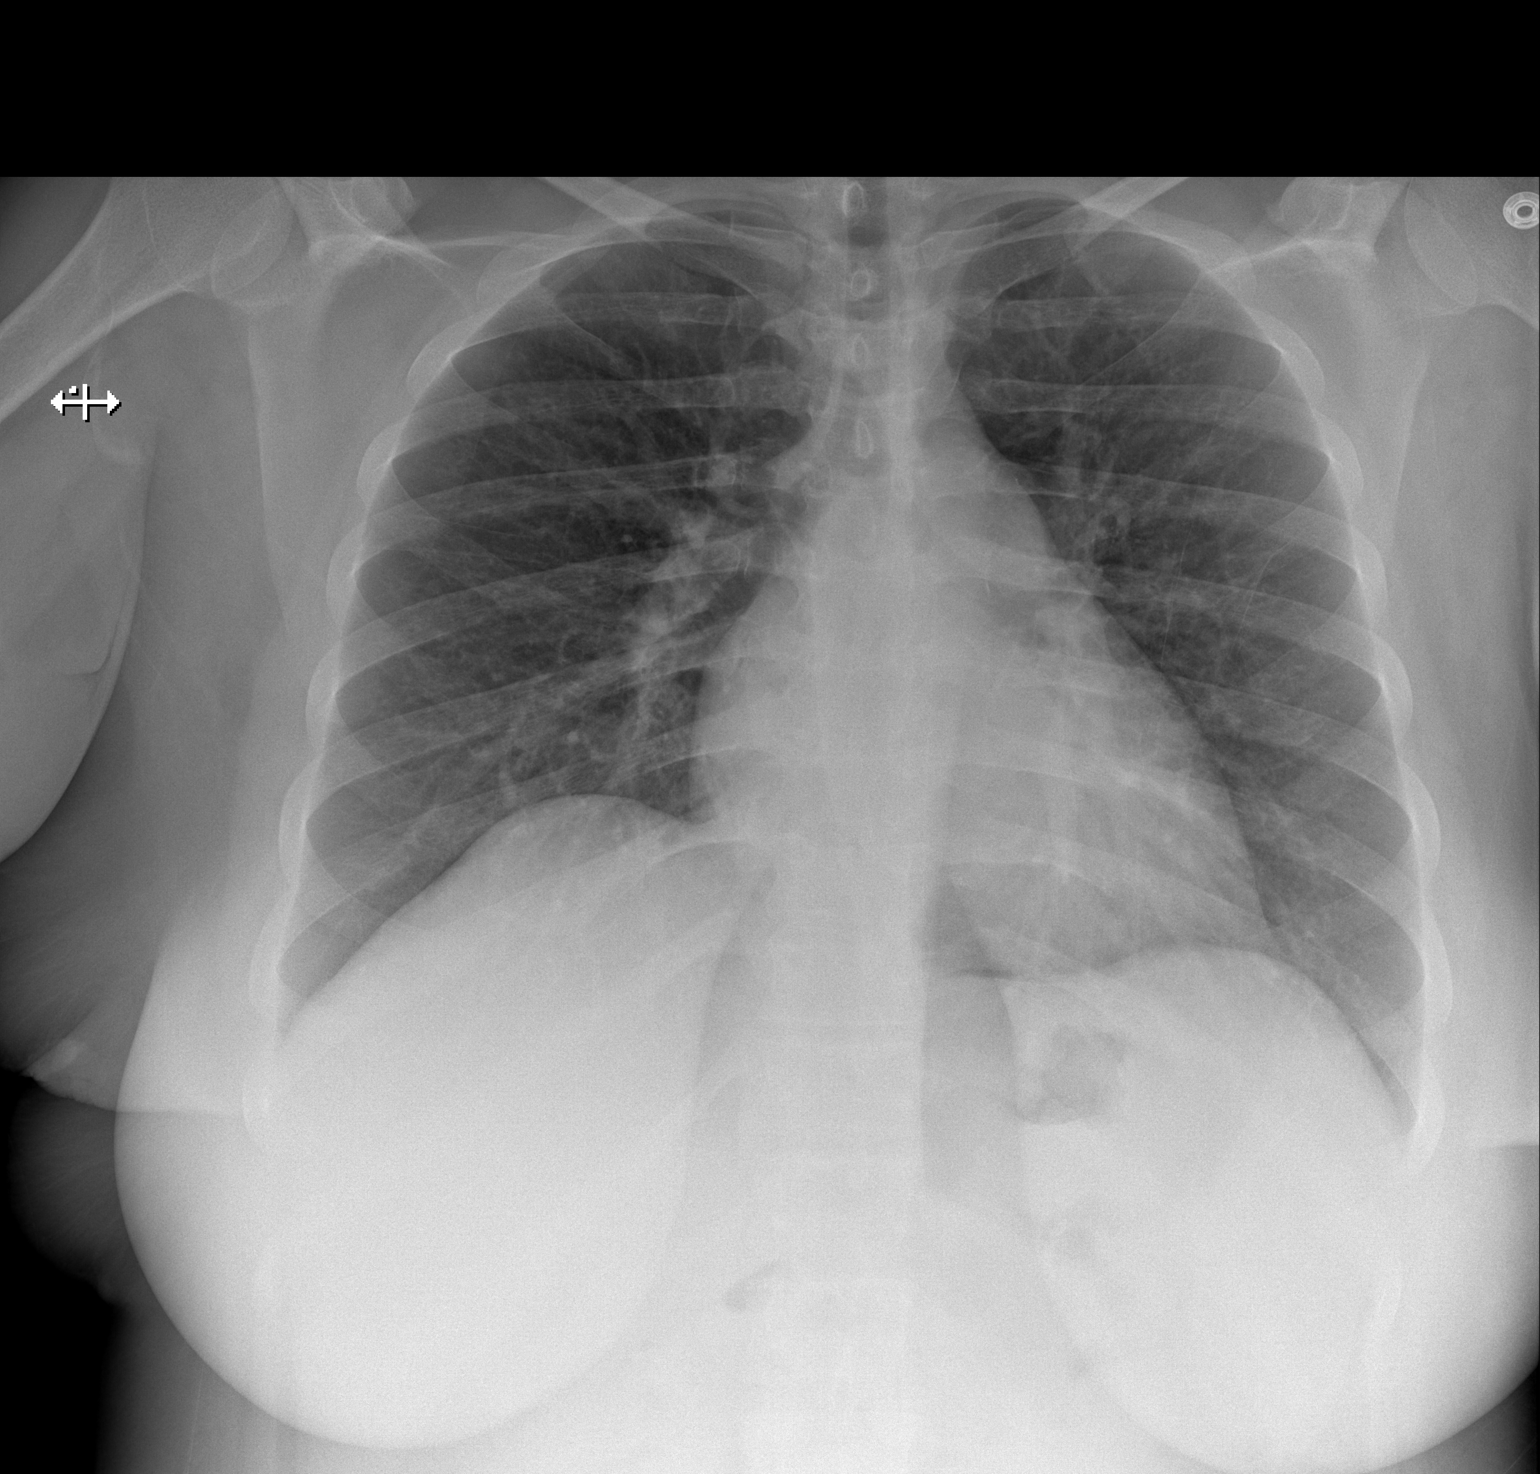
[im 2/2]
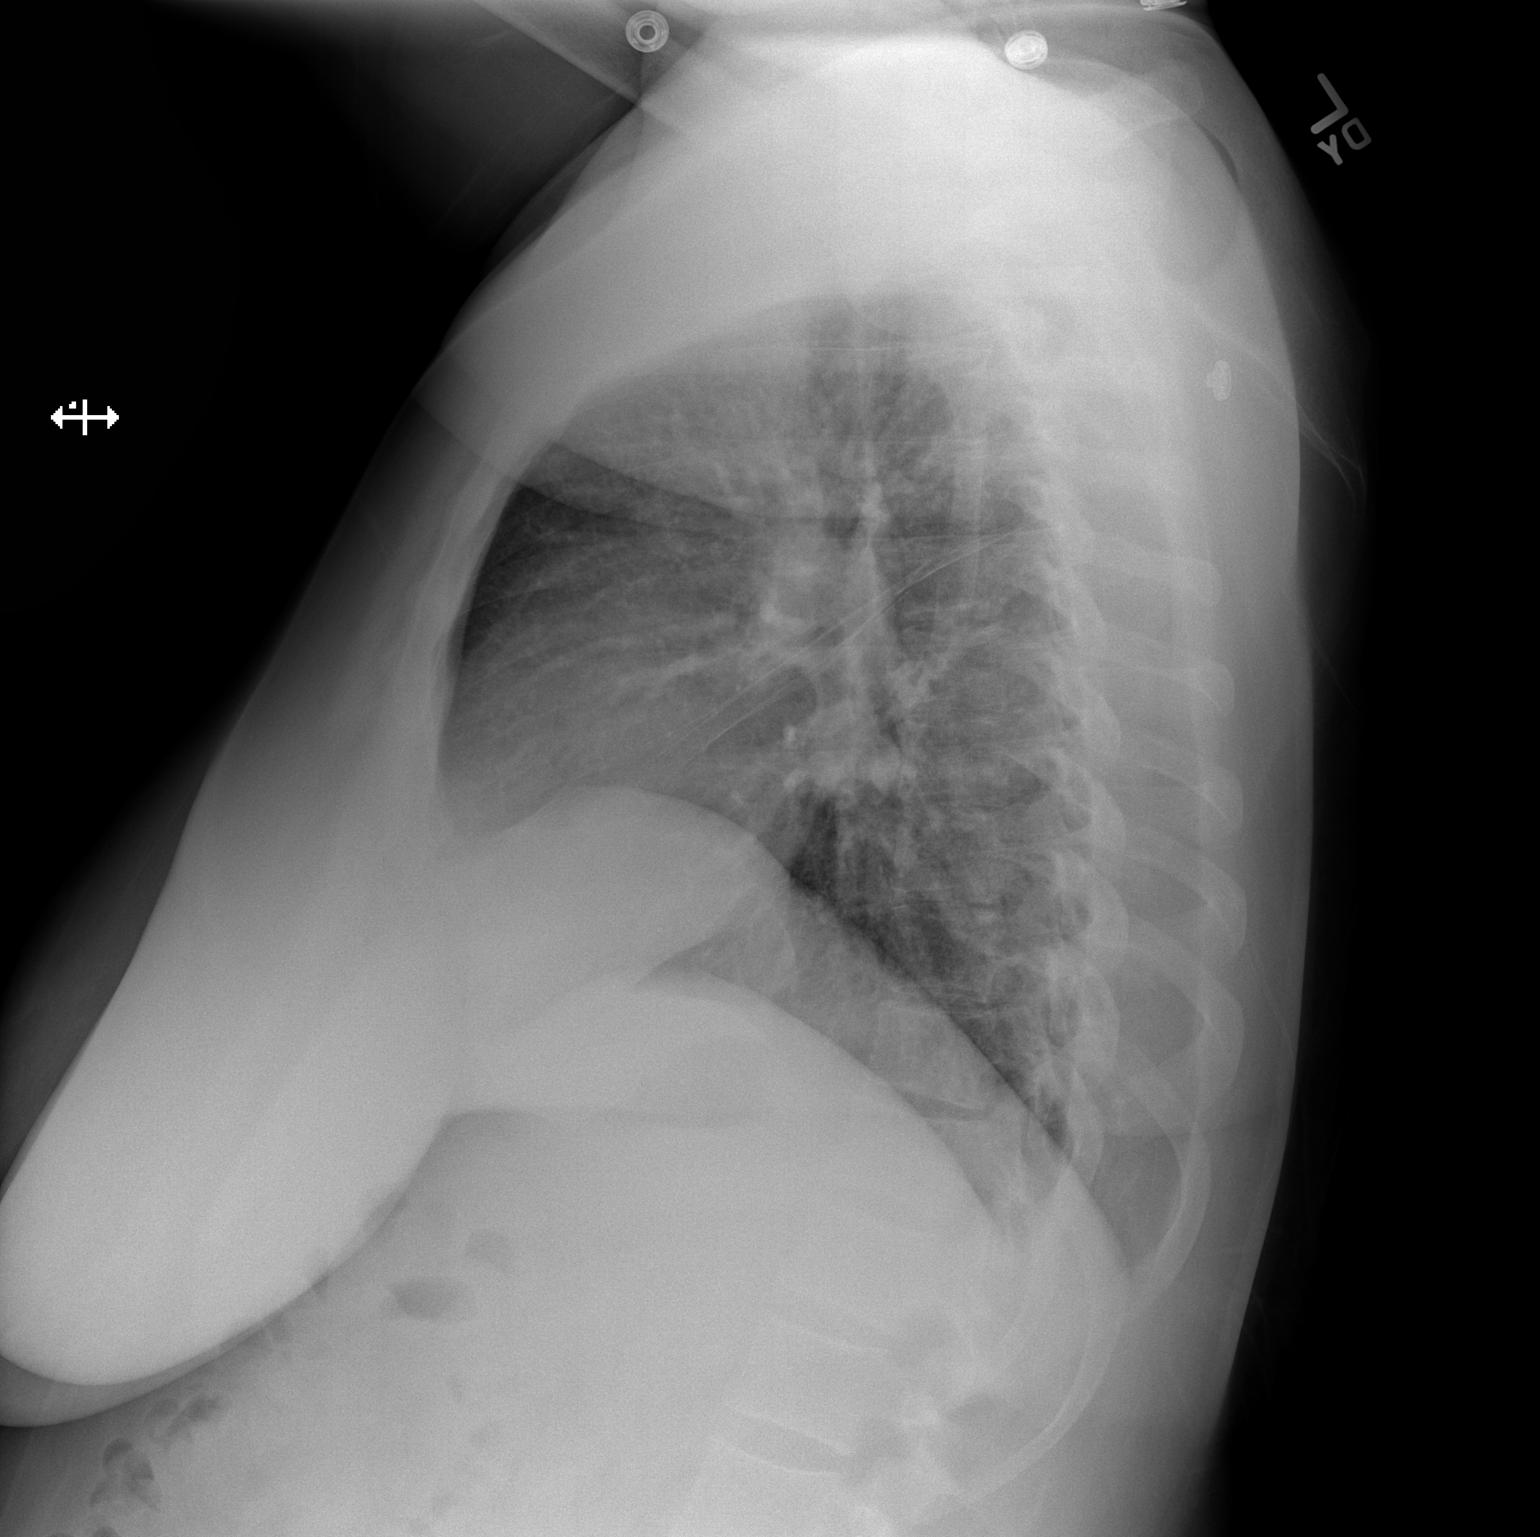

[2 of 2 positions shown; findings below may reference images not displayed]

FINDINGS: Cardiac shadow is within normal limits. The lungs are well aerated
bilaterally. No focal infiltrate or sizable effusion is seen. No
acute bony abnormality is noted.
IMPRESSION: No active cardiopulmonary disease.

## 2015-11-27 ENCOUNTER — Encounter (INDEPENDENT_AMBULATORY_CARE_PROVIDER_SITE_OTHER): Payer: Medicare Other

## 2015-11-27 ENCOUNTER — Ambulatory Visit (INDEPENDENT_AMBULATORY_CARE_PROVIDER_SITE_OTHER): Payer: Self-pay | Admitting: Vascular Surgery

## 2015-11-27 IMAGING — XA IR VASCULAR PROCEDURE
2 series · 2 of 2 positions shown · IV contrast (IODINE)
Comparison: none

[Series 1: care upper arm · 1 of 1 slices shown]
[im 1/1]
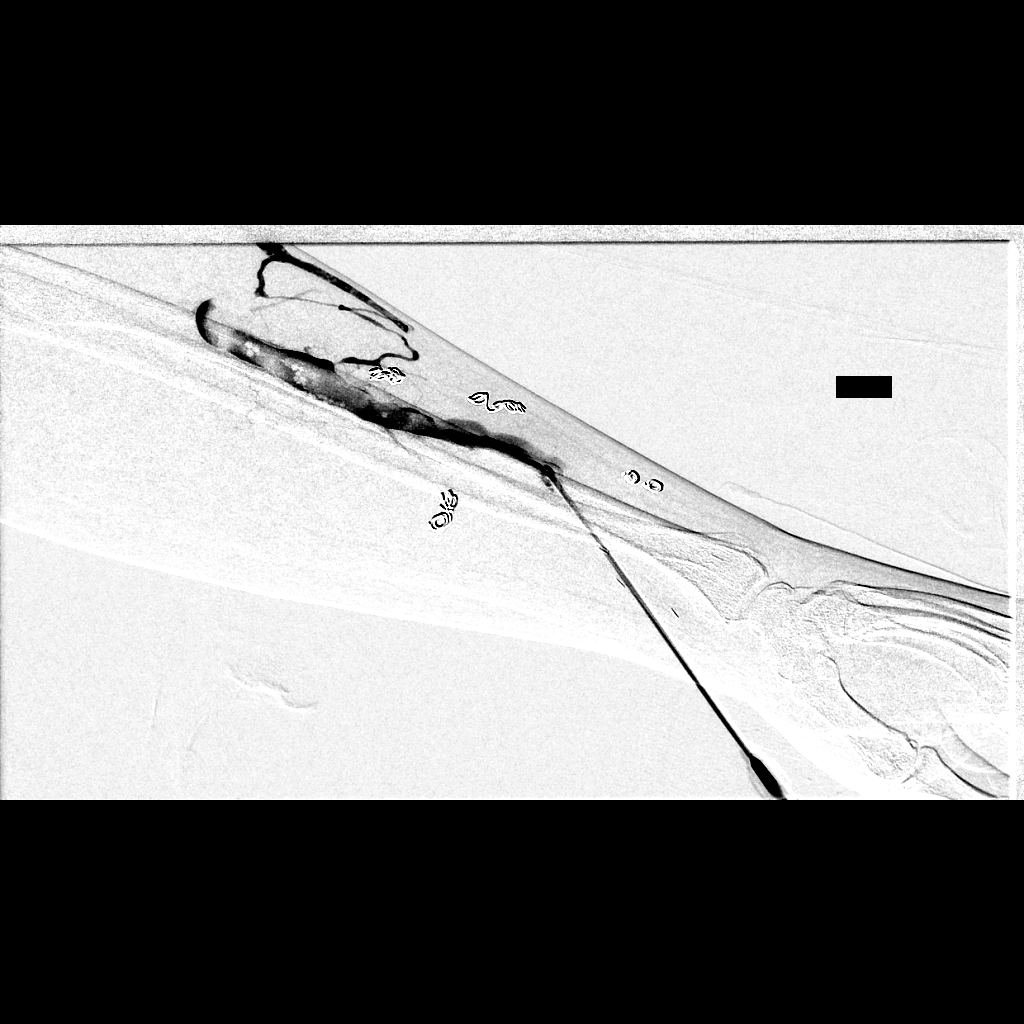

[Series 2: fl - angio · 1 of 1 slices shown]
[im 1/1]
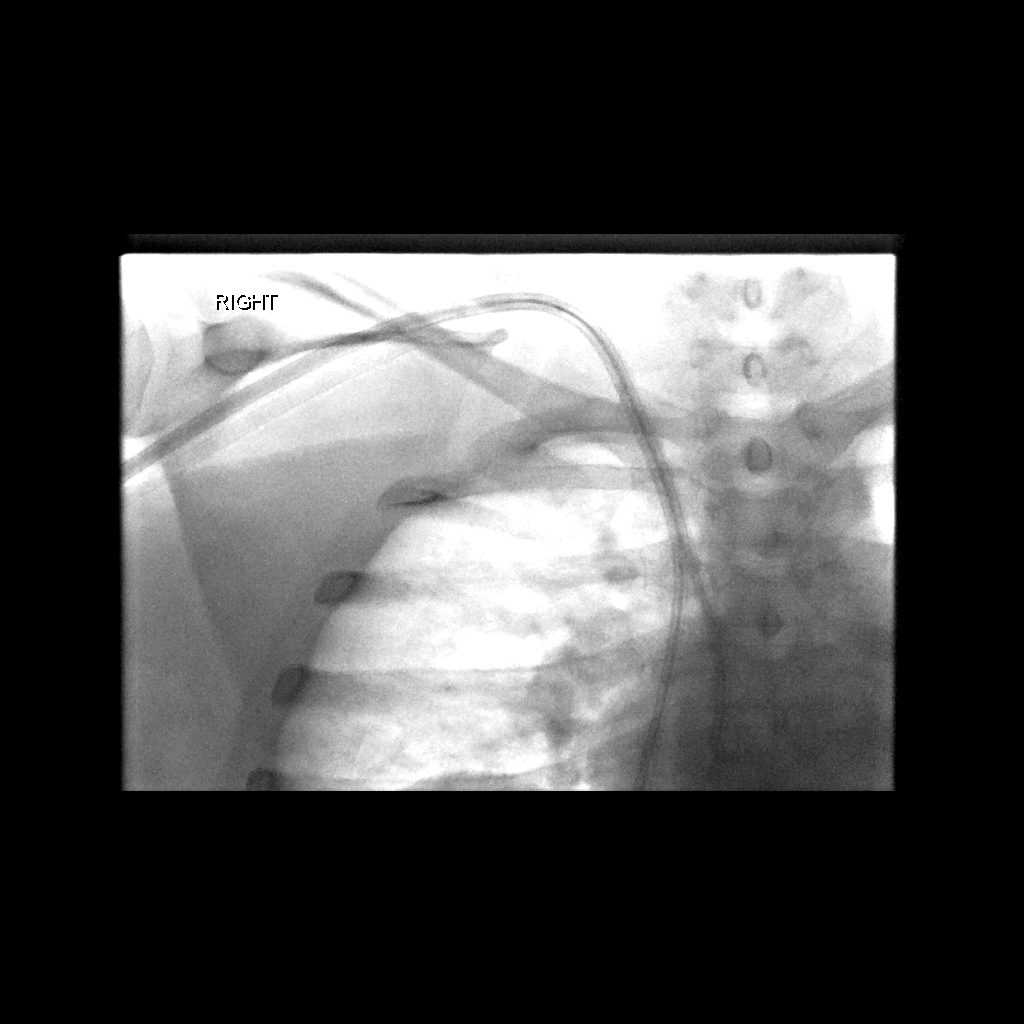

[2 of 2 positions shown; findings below may reference images not displayed]

IMAGES IMPORTED FROM THE [employer] WORKFLOW SYSTEM
NO DICTATION FOR STUDY

## 2015-12-04 ENCOUNTER — Ambulatory Visit (INDEPENDENT_AMBULATORY_CARE_PROVIDER_SITE_OTHER): Payer: Self-pay | Admitting: Vascular Surgery

## 2015-12-07 ENCOUNTER — Ambulatory Visit: Admission: RE | Admit: 2015-12-07 | Payer: Medicare Other | Source: Ambulatory Visit | Admitting: Vascular Surgery

## 2015-12-07 SURGERY — DIALYSIS/PERMA CATHETER REMOVAL
Anesthesia: Moderate Sedation

## 2015-12-08 ENCOUNTER — Other Ambulatory Visit (INDEPENDENT_AMBULATORY_CARE_PROVIDER_SITE_OTHER): Payer: Self-pay | Admitting: Vascular Surgery

## 2015-12-08 DIAGNOSIS — N186 End stage renal disease: Secondary | ICD-10-CM

## 2015-12-08 DIAGNOSIS — T829XXS Unspecified complication of cardiac and vascular prosthetic device, implant and graft, sequela: Secondary | ICD-10-CM

## 2015-12-09 ENCOUNTER — Ambulatory Visit (INDEPENDENT_AMBULATORY_CARE_PROVIDER_SITE_OTHER): Payer: Medicare Other

## 2015-12-09 DIAGNOSIS — T829XXS Unspecified complication of cardiac and vascular prosthetic device, implant and graft, sequela: Secondary | ICD-10-CM | POA: Diagnosis not present

## 2015-12-09 DIAGNOSIS — N186 End stage renal disease: Secondary | ICD-10-CM

## 2015-12-10 ENCOUNTER — Other Ambulatory Visit (INDEPENDENT_AMBULATORY_CARE_PROVIDER_SITE_OTHER): Payer: Self-pay | Admitting: Vascular Surgery

## 2015-12-10 ENCOUNTER — Telehealth (INDEPENDENT_AMBULATORY_CARE_PROVIDER_SITE_OTHER): Payer: Self-pay | Admitting: Vascular Surgery

## 2015-12-10 DIAGNOSIS — N186 End stage renal disease: Secondary | ICD-10-CM

## 2015-12-10 DIAGNOSIS — Z992 Dependence on renal dialysis: Principal | ICD-10-CM

## 2015-12-10 NOTE — Telephone Encounter (Signed)
Reviewed dialysis access results with patient. OK to start dialysis through fistula. If successful will plan on removing permcath. Spoke with dialysis staff as well. Patient to follow up in six months with left HDA.

## 2016-01-12 ENCOUNTER — Encounter (INDEPENDENT_AMBULATORY_CARE_PROVIDER_SITE_OTHER): Payer: Self-pay

## 2016-01-12 ENCOUNTER — Other Ambulatory Visit (INDEPENDENT_AMBULATORY_CARE_PROVIDER_SITE_OTHER): Payer: Self-pay | Admitting: Vascular Surgery

## 2016-01-14 ENCOUNTER — Encounter (INDEPENDENT_AMBULATORY_CARE_PROVIDER_SITE_OTHER): Payer: Self-pay

## 2016-01-19 ENCOUNTER — Ambulatory Visit: Admission: RE | Admit: 2016-01-19 | Payer: Medicare Other | Source: Ambulatory Visit | Admitting: Vascular Surgery

## 2016-01-19 SURGERY — DIALYSIS/PERMA CATHETER REMOVAL
Anesthesia: Moderate Sedation

## 2016-02-05 ENCOUNTER — Ambulatory Visit (INDEPENDENT_AMBULATORY_CARE_PROVIDER_SITE_OTHER): Payer: Medicare Other | Admitting: Vascular Surgery

## 2016-02-05 ENCOUNTER — Encounter (INDEPENDENT_AMBULATORY_CARE_PROVIDER_SITE_OTHER): Payer: Self-pay | Admitting: Vascular Surgery

## 2016-02-05 ENCOUNTER — Other Ambulatory Visit (INDEPENDENT_AMBULATORY_CARE_PROVIDER_SITE_OTHER): Payer: Medicare Other

## 2016-02-05 ENCOUNTER — Encounter (INDEPENDENT_AMBULATORY_CARE_PROVIDER_SITE_OTHER): Payer: Self-pay

## 2016-02-05 VITALS — BP 156/93 | HR 95 | Resp 16 | Ht 64.0 in | Wt 220.0 lb

## 2016-02-05 DIAGNOSIS — N186 End stage renal disease: Secondary | ICD-10-CM

## 2016-02-05 DIAGNOSIS — E1122 Type 2 diabetes mellitus with diabetic chronic kidney disease: Secondary | ICD-10-CM

## 2016-02-05 DIAGNOSIS — I10 Essential (primary) hypertension: Secondary | ICD-10-CM | POA: Insufficient documentation

## 2016-02-05 DIAGNOSIS — E119 Type 2 diabetes mellitus without complications: Secondary | ICD-10-CM | POA: Insufficient documentation

## 2016-02-05 DIAGNOSIS — Z992 Dependence on renal dialysis: Secondary | ICD-10-CM | POA: Diagnosis not present

## 2016-02-05 DIAGNOSIS — T829XXA Unspecified complication of cardiac and vascular prosthetic device, implant and graft, initial encounter: Secondary | ICD-10-CM

## 2016-02-05 NOTE — Progress Notes (Signed)
MRN : [id number]  [name] is a 31 y.o. (1985-05-09) female who presents with chief complaint of  Chief Complaint  Patient presents with  . Re-evaluation    Evaluation for steal syndrome  .  History of Present Illness: Patient returns today in follow up of ESRD and dialysis access. She has been having numbness and pain in her left hand. This is happening with dialysis but is also happening at night. She has artery been told she had carpal tunnel and the hand, but given her extensive history of kidney disease and other atherosclerotic risk factors, steal syndrome was considered. Her studies today show normal waveforms in the digits of the left hand with or without compression. With this finding, she does not appear to have steal syndrome.  Current Outpatient Prescriptions  Medication Sig Dispense Refill  . albuterol (PROVENTIL HFA;VENTOLIN HFA) 108 (90 Base) MCG/ACT inhaler Inhale 2 puffs into the lungs 2 (two) times daily as needed for wheezing or shortness of breath.     [name] Kitchen aspirin 81 MG tablet Take 81 mg by mouth daily.      . calcium acetate, Phos Binder, (PHOSLYRA) 667 MG/5ML SOLN TK 15ML PO TID WITH MEALS    . cinacalcet (SENSIPAR) 30 MG tablet Take 30 mg by mouth daily with supper.    . clindamycin (CLEOCIN) 150 MG capsule Take 3 capsules (450 mg total) by mouth 3 (three) times daily. (Patient taking differently: Take 300 mg by mouth as directed. 380ms 1 hour prior to dental procedures) 90 capsule 0  . cyclobenzaprine (FLEXERIL) 10 MG tablet Take 1 tablet (10 mg total) by mouth 3 (three) times daily as needed for muscle spasms. 15 tablet 0  . darbepoetin (ARANESP, ALB FREE, SURECLICK) 2409MWJX/[id number] Inject 200 mcg into the skin every 7 (seven) days. Reported on 07/14/2015    . diazepam (VALIUM) 2 MG tablet Take 1 tablet (2 mg total) by mouth every 8 (eight) hours as needed for muscle spasms. 9 tablet 0  . docusate sodium (COLACE) 100 MG capsule Take 100 mg by mouth daily.     . insulin glargine (LANTUS) 100 UNIT/ML injection Inject 6 Units into the skin daily as needed (high blood sugar). If blood sugar is over 150    . metoprolol succinate (TOPROL-XL) 50 MG 24 hr tablet Take 50 mg by mouth at bedtime. Take with or immediately following a meal.    . metoprolol tartrate (LOPRESSOR) 25 MG tablet Take 12.5 mg by mouth.    . nicotine (NICODERM CQ - DOSED IN MG/24 HOURS) 21 mg/24hr patch Place onto the skin.    .[name] Kitchenomeprazole (PRILOSEC) 20 MG capsule Take 20 mg by mouth daily.    .[name] KitchenOVER THE COUNTER MEDICATION Take 5 mLs by mouth daily. nutra burst multivitamin liquid    . oxyCODONE (OXY IR/ROXICODONE) 5 MG immediate release tablet Take 5 mg by mouth.    . oxyCODONE-acetaminophen (ROXICET) 5-325 MG tablet Take 1-2 tablets by mouth every 6 (six) hours as needed. 10 tablet 0  . pravastatin (PRAVACHOL) 40 MG tablet Take 40 mg by mouth at bedtime.     . promethazine (PHENERGAN) 12.5 MG tablet Take 12.5 mg by mouth every 6 (six) hours as needed for nausea or vomiting.    . traMADol (ULTRAM) 50 MG tablet Take 50 mg by mouth every 6 (six) hours as needed for moderate pain.      No current facility-administered medications for this visit.     Past  Medical History:  Diagnosis Date  . Asthma   . Diabetes mellitus   . Dysrhythmia   . 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 Hypertension   . Irregular heart beat   . Morbid obesity ([place])   . Renal insufficiency     Past Surgical History:  Procedure Laterality Date  . AV FISTULA PLACEMENT  08/26/10   Left Radiocephalic AVF  . AV FISTULA PLACEMENT  08/26/2015   Procedure: ARTERIOVENOUS (AV) FISTULA CREATION;  Surgeon: Algernon Huxley, MD;  Location: [facility];  Service: Vascular;;  . EYE SURGERY     laser photocoagulation  . EYE SURGERY Right 2008   removal of blood clot  . INTRAUTERINE DEVICE (IUD) INSERTION    . PERIPHERAL VASCULAR CATHETERIZATION N/A 07/07/2014   Procedure: A/V Shuntogram/Fistulagram;  Surgeon: Algernon Huxley, MD;  Location: [facility];  Service: Cardiovascular;  Laterality: N/A;  . PERIPHERAL VASCULAR CATHETERIZATION N/A 07/07/2014   Procedure: A/V Shunt Intervention;  Surgeon: Algernon Huxley, MD;  Location: [place] [facility];  Service: Cardiovascular;  Laterality: N/A;  . PERIPHERAL VASCULAR CATHETERIZATION Right 01/12/2015   Procedure: A/V Shuntogram/Fistulagram;  Surgeon: Algernon Huxley, MD;  Location: [place] [facility];  Service: Cardiovascular;  Laterality: Right;  . PERIPHERAL VASCULAR CATHETERIZATION N/A 01/12/2015   Procedure: A/V Shunt Intervention;  Surgeon: Algernon Huxley, MD;  Location: [place] [facility];  Service: Cardiovascular;  Laterality: N/A;  . PERIPHERAL VASCULAR CATHETERIZATION Right 06/08/2015   Procedure: A/V Shuntogram/Fistulagram;  Surgeon: Algernon Huxley, MD;  Location: [place] [facility];  Service: Cardiovascular;  Laterality: Right;  . PERIPHERAL VASCULAR CATHETERIZATION N/A 06/08/2015   Procedure: A/V Shunt Intervention;  Surgeon: Algernon Huxley, MD;  Location: [place] [facility];  Service: Cardiovascular;  Laterality: N/A;  . PERIPHERAL VASCULAR CATHETERIZATION Right 07/14/2015   Procedure: Thrombectomy;  Surgeon: Katha Cabal, MD;  Location: [place] [facility];  Service: Cardiovascular;  Laterality: Right;  . PERIPHERAL VASCULAR CATHETERIZATION N/A 07/14/2015   Procedure: A/V Shuntogram/Fistulagram;  Surgeon: Katha Cabal, MD;  Location: [place] [facility];  Service: Cardiovascular;  Laterality: N/A;  . PERIPHERAL VASCULAR CATHETERIZATION N/A 07/17/2015   Procedure: Thrombectomy;  Surgeon: Katha Cabal, MD;  Location: [place] [facility];  Service: Cardiovascular;  Laterality: N/A;  . PERIPHERAL VASCULAR CATHETERIZATION N/A 07/17/2015   Procedure: Dialysis/Perma Catheter Insertion;  Surgeon: Katha Cabal, MD;  Location: [place] [facility];  Service: Cardiovascular;  Laterality: N/A;  . PERIPHERAL VASCULAR CATHETERIZATION N/A 08/05/2015    Procedure: Thrombectomy;  Surgeon: Katha Cabal, MD;  Location: [place] [facility];  Service: Cardiovascular;  Laterality: N/A;  declot of graft    Social History Social History  Substance Use Topics  . Smoking status: Light Tobacco Smoker    Packs/day: 0.00    Years: 10.00    Types: Cigarettes  . Smokeless tobacco: Never Used  . Alcohol use No      Family History No bleeding or clotting disorders  Allergies  Allergen Reactions  . Marcillin [Ampicillin] Rash and Other (See Comments)    Breathing and heart palp  Hot and sweating Has patient had a PCN reaction causing immediate rash, facial/tongue/throat swelling, SOB or lightheadedness with hypotension: Yes Has patient had a PCN reaction causing severe rash involving mucus membranes or skin necrosis: No Has patient had a PCN reaction that required hospitalization Unknown Has patient had a PCN reaction occurring within the last 10  years: Yes If all of the above answers are "NO", then may proceed with Cephalosporin use.   . Latex Swelling and Rash  . Vancomycin Itching, Nausea Only and Rash    Thick pieces of skin pulls off.     REVIEW OF SYSTEMS (Negative unless checked)  Constitutional: _0 Weight loss  _1 Fever  _2 Chills Cardiac: _3 Chest pain   _4 Chest pressure   _5 Palpitations   _6 Shortness of breath when laying flat   _7 Shortness of breath at rest   _8 Shortness of breath with exertion. Vascular:  _9 Pain in legs with walking   _10 Pain in legs at rest   _11 Pain in legs when laying flat   _12 Claudication   _13 Pain in feet when walking  _14 Pain in feet at rest  _15 Pain in feet when laying flat   _16 History of DVT   _17 Phlebitis   _18 Swelling in legs   _19 Varicose veins   _20 Non-healing ulcers Pulmonary:   _21 Uses home oxygen   _22 Productive cough   _23 Hemoptysis   _24 Wheeze  _25 COPD   _26 Asthma Neurologic:  _27 Dizziness  _28 Blackouts   _29 Seizures   _30 History of stroke   _31 History of TIA  _32 Aphasia   _33 Temporary blindness   _34 Dysphagia    _35 Weakness or numbness in arms   _36 Weakness or numbness in legs Musculoskeletal:  _37 Arthritis   _38 Joint swelling   _39 Joint pain   _40 Low back pain Hematologic:  _41 Easy bruising  _42 Easy bleeding   _43 Hypercoagulable state   _44 Anemic   Gastrointestinal:  _45 Blood in stool   _46 Vomiting blood  _47 Gastroesophageal reflux/heartburn   _48 Abdominal pain Genitourinary:  _49 Chronic kidney disease   _50 Difficult urination  _51 Frequent urination  _52 Burning with urination   _53 Hematuria Skin:  _54 Rashes   _55 Ulcers   _56 Wounds Psychological:  _57 History of anxiety   _58  History of major depression.  Physical Examination  BP (!) 156/93 (BP Location: Right Arm)   Pulse 95   Resp 16   Ht _59  (1.626 m)   Wt 220 lb (99.8 kg)   BMI 37.76 kg/m  Gen:  WD/WN, NAD Head: [place]/AT, No temporalis wasting. Ear/Nose/Throat: Hearing grossly intact, nares w/o erythema or drainage, trachea midline Eyes: Conjunctiva clear. Sclera non-icteric Neck: Supple.  No JVD.  Pulmonary:  Good air movement, no use of accessory muscles.  Cardiac: RRR, normal S1, S2 Vascular: Good thrill and bruit are present and left arm AV fistula Vessel Right Left  Radial Palpable Palpable                                   Gastrointestinal: soft, non-tender/non-distended. No guarding/reflex.  Musculoskeletal: M/S 5/5 throughout.  No deformity or atrophy.  Neurologic: Sensation grossly intact in extremities.  Symmetrical.  Speech is fluent.  Psychiatric: Judgment intact, Mood & affect appropriate for pt's clinical situation. Dermatologic: No rashes or ulcers noted.  No cellulitis or open wounds. Lymph : No Cervical, Axillary, or Inguinal lymphadenopathy.      Labs Recent Results (from the past 2160 hour(s))  CBC with Differential     Status: Abnormal   Collection Time: 11/09/15  3:50 AM  Result Value Ref Range   WBC 9.3 3.6 - 11.0 K/uL   RBC 4.27 3.80 - 5.20 MIL/uL   Hemoglobin 12.6 12.0 - 16.0 g/dL   HCT 36.8 35.0 - 47.0 %   MCV  86.1 80.0 - 100.0 fL   MCH 29.4 26.0 - 34.0 pg   MCHC 34.1 32.0 - 36.0 g/dL  RDW 17.5 (H) 11.5 - 14.5 %   Platelets 184 150 - 440 K/uL   Neutrophils Relative % 43 %   Neutro Abs 4.0 1.4 - 6.5 K/uL   Lymphocytes Relative 43 %   Lymphs Abs 4.1 (H) 1.0 - 3.6 K/uL   Monocytes Relative 7 %   Monocytes Absolute 0.6 0.2 - 0.9 K/uL   Eosinophils Relative 6 %   Eosinophils Absolute 0.5 0 - 0.7 K/uL   Basophils Relative 1 %   Basophils Absolute 0.1 0 - 0.1 K/uL  Basic metabolic panel     Status: Abnormal   Collection Time: 11/09/15  3:50 AM  Result Value Ref Range   Sodium 138 135 - 145 mmol/L   Potassium 4.8 3.5 - 5.1 mmol/L   Chloride 103 101 - 111 mmol/L   CO2 24 22 - 32 mmol/L   Glucose, Bld 103 (H) 65 - 99 mg/dL   BUN 47 (H) 6 - 20 mg/dL   Creatinine, Ser 12.30 (H) 0.44 - 1.00 mg/dL   Calcium 9.6 8.9 - 10.3 mg/dL   GFR calc non Af Amer 4 (L) >60 mL/min   GFR calc Af Amer 4 (L) >60 mL/min    Comment: (NOTE) The eGFR has been calculated using the CKD EPI equation. This calculation has not been validated in all clinical situations. eGFR's persistently <60 mL/min signify possible Chronic Kidney Disease.    Anion gap 11 5 - 15    Radiology No results found.    Assessment/Plan  Essential hypertension, benign blood pressure control important in reducing the progression of atherosclerotic disease. On appropriate oral medications.   Diabetes ([place]) blood glucose control important in reducing the progression of atherosclerotic disease. Also, involved in wound healing. On appropriate medications.   End stage renal disease She has pain in her left arm and it was not entirely clear the etiology of this pain. Concern for steal syndrome was present, and studies were done today to assess for this. Her studies today have good waveforms with or without compression in the digits of the left hand which would not demonstrate steal syndrome. I think her symptoms are more likely neuropathic  in nature and she does have carpal tunnel in the hand. At this point, I think we need to think about removing her catheter which has been in for about 6 months.  Complication from renal dialysis device She has pain in her left arm and it was not entirely clear the etiology of this pain. Concern for steal syndrome was present, and studies were done today to assess for this. Her studies today have good waveforms with or without compression in the digits of the left hand which would not demonstrate steal syndrome. I think her symptoms are more likely neuropathic in nature and she does have carpal tunnel in the hand. At this point, I think we need to think about removing her catheter which has been in for about 6 months.    Leotis Pain, MD  02/05/2016 10:21 AM    This note was created with Dragon medical transcription system.  Any errors from dictation are purely unintentional

## 2016-02-05 NOTE — Assessment & Plan Note (Signed)
She has pain in her left arm and it was not entirely clear the etiology of this pain. Concern for steal syndrome was present, and studies were done today to assess for this. Her studies today have good waveforms with or without compression in the digits of the left hand which would not demonstrate steal syndrome. I think her symptoms are more likely neuropathic in nature and she does have carpal tunnel in the hand. At this point, I think we need to think about removing her catheter which has been in for about 6 months.

## 2016-02-05 NOTE — Assessment & Plan Note (Signed)
blood pressure control important in reducing the progression of atherosclerotic disease. On appropriate oral medications.  

## 2016-02-05 NOTE — Assessment & Plan Note (Signed)
blood glucose control important in reducing the progression of atherosclerotic disease. Also, involved in wound healing. On appropriate medications.  

## 2016-02-11 ENCOUNTER — Other Ambulatory Visit (INDEPENDENT_AMBULATORY_CARE_PROVIDER_SITE_OTHER): Payer: Self-pay | Admitting: Vascular Surgery

## 2016-02-15 ENCOUNTER — Encounter
Admission: RE | Disposition: A | Discharge status: Home / Self Care | Payer: Self-pay | Source: Ambulatory Visit | Attending: Vascular Surgery

## 2016-02-15 ENCOUNTER — Ambulatory Visit
Admission: RE | Admit: 2016-02-15 | Discharge: 2016-02-15 | Disposition: A | Discharge status: Home / Self Care | Payer: Medicare Other | Source: Ambulatory Visit | Attending: Vascular Surgery | Admitting: Vascular Surgery

## 2016-02-15 DIAGNOSIS — Z91040 Latex allergy status: Secondary | ICD-10-CM | POA: Insufficient documentation

## 2016-02-15 DIAGNOSIS — Y939 Activity, unspecified: Secondary | ICD-10-CM | POA: Diagnosis not present

## 2016-02-15 DIAGNOSIS — Z9884 Bariatric surgery status: Secondary | ICD-10-CM | POA: Diagnosis not present

## 2016-02-15 DIAGNOSIS — N186 End stage renal disease: Secondary | ICD-10-CM | POA: Insufficient documentation

## 2016-02-15 DIAGNOSIS — Z881 Allergy status to other antibiotic agents status: Secondary | ICD-10-CM | POA: Insufficient documentation

## 2016-02-15 DIAGNOSIS — Z992 Dependence on renal dialysis: Secondary | ICD-10-CM | POA: Insufficient documentation

## 2016-02-15 DIAGNOSIS — E1122 Type 2 diabetes mellitus with diabetic chronic kidney disease: Secondary | ICD-10-CM | POA: Diagnosis not present

## 2016-02-15 DIAGNOSIS — F17210 Nicotine dependence, cigarettes, uncomplicated: Secondary | ICD-10-CM | POA: Diagnosis not present

## 2016-02-15 DIAGNOSIS — Z5309 Procedure and treatment not carried out because of other contraindication: Secondary | ICD-10-CM | POA: Insufficient documentation

## 2016-02-15 DIAGNOSIS — T82848A Pain from vascular prosthetic devices, implants and grafts, initial encounter: Secondary | ICD-10-CM | POA: Insufficient documentation

## 2016-02-15 DIAGNOSIS — Z79899 Other long term (current) drug therapy: Secondary | ICD-10-CM | POA: Insufficient documentation

## 2016-02-15 DIAGNOSIS — I499 Cardiac arrhythmia, unspecified: Secondary | ICD-10-CM | POA: Insufficient documentation

## 2016-02-15 DIAGNOSIS — X58XXXA Exposure to other specified factors, initial encounter: Secondary | ICD-10-CM | POA: Insufficient documentation

## 2016-02-15 DIAGNOSIS — I120 Hypertensive chronic kidney disease with stage 5 chronic kidney disease or end stage renal disease: Secondary | ICD-10-CM | POA: Diagnosis not present

## 2016-02-15 SURGERY — DIALYSIS/PERMA CATHETER REMOVAL
Anesthesia: Moderate Sedation

## 2016-02-15 MED ORDER — MIDAZOLAM HCL 2 MG/ML PO SYRP
8.0000 mg | ORAL_SOLUTION | Freq: Once | ORAL | Status: AC
  Administered 2016-02-15: 8 mg via ORAL

## 2016-02-15 MED ORDER — MIDAZOLAM HCL 2 MG/ML PO SYRP
ORAL_SOLUTION | ORAL | Status: AC
  Administered 2016-02-15: 2 mg via ORAL
  Filled 2016-02-15: qty 4

## 2016-02-15 NOTE — Progress Notes (Signed)
Per MD procedure will not be done today. Patient states her access was infiltrated on Saturday. MD feels we need to leave temp access in until fistula can recover. Patient is aware and will reschedule with office.

## 2016-02-15 NOTE — Progress Notes (Signed)
Pt ready for procedure. Stable.

## 2016-02-15 NOTE — H&P (Signed)
 [facility] History & Physical Update  The patient was interviewed and re-examined.  The patient's previous History and Physical has been reviewed and is unchanged.  There is no change in the plan of care. We plan to proceed with the scheduled procedure.  Leotis Pain, MD  02/15/2016, 8:10 AM

## 2016-02-15 NOTE — Progress Notes (Signed)
Pt resting quietly, resp regular and unlabored.

## 2016-02-15 NOTE — Discharge Instructions (Signed)
Central Line Dialysis Access Placement, Care After This sheet gives you information about how to care for yourself after your procedure. Your health care provider may also give you more specific instructions. If you have problems or questions, contact your health care provider. What can I expect after the procedure? After the procedure, it is common to have:  Mild pain or discomfort.  Mild redness, swelling, or bruising around your incision.  A small amount of blood or clear fluid coming from your incision. Follow these instructions at home: Incision care  Follow instructions from your health care provider about how to take care of your incision. Make sure you:  Wash your hands with soap and water before you change your bandage (dressing). If soap and water are not available, use hand sanitizer.  Change your dressing as told by your health care provider.  Leave stitches (sutures) in place.  Check your incision area every day for signs of infection. Check for:  More redness, swelling, or pain.  More fluid or blood.  Warmth.  Pus or a bad smell.  If directed, put heat on the catheter site as often as told by your health care provider. Use the heat source that your health care provider recommends, such as a moist heat pack or a heating pad.  Place a towel between your skin and the heat source.  Leave the heat on for 20-30 minutes.  Remove the heat if your skin turns bright red. This is especially important if you are unable to feel pain, heat, or cold. You may have a greater risk of getting burned.  If directed, put ice on the catheter site:  Put ice in a plastic bag.  Place a towel between your skin and the bag.  Leave the ice on for 20 minutes, 2-3 times a day. Medicines  Take over-the-counter and prescription medicines only as told by your health care provider.  If you were prescribed an antibiotic medicine, use it as told by your health care provider. Do not stop  using the antibiotic even if you start to feel better. Activity  Return to your normal activities as told by your health care provider. Ask your health care provider what activities are safe for you.  Do not lift anything that is heavier than 10 lb (4.5 kg) until your health care provider says that this is safe. Driving  Do not drive for 24 hours if you were given a medicine to help you relax (sedative) during your procedure.  Do not drive or use heavy machinery while taking prescription pain medicine. Lifestyle  Limit alcohol intake to no more than 1 drink a day for nonpregnant women and 2 drinks a day for men. One drink equals 12 oz of beer, 5 oz of wine, or 1 oz of hard liquor.  Do not use any products that contain nicotine or tobacco, such as cigarettes and e-cigarettes. If you need help quitting, ask your health care provider. General instructions  Do not take baths or showers, swim, or use a hot tub until your health care provider approves. You may only be allowed to take sponge baths for bathing.  Wear compression stockings as told by your health care provider. These stockings help to prevent blood clots and reduce swelling in your legs.  Follow instructions from your health care provider about eating or drinking restrictions.  Keep all follow-up visits as told by your health care provider. This is important. Contact a health care provider if:  Your catheter  gets pulled out of place.  Your catheter site becomes itchy.  You develop a rash around your catheter site.  You have more redness, swelling, or pain around your incision.  You have more fluid or blood coming from your incision.  Your incision area feels warm to the touch.  You have pus or a bad smell coming from your incision.  You have a fever. Get help right away if:  You become light-headed or dizzy.  You faint.  You have difficulty breathing.  Your catheter gets pulled out completely. This  information is not intended to replace advice given to you by your health care provider. Make sure you discuss any questions you have with your health care provider. Document Released: 09/01/2003 Document Revised: 10/12/2015 Document Reviewed: 10/12/2015 Elsevier Interactive Patient Education  2017 [employer].

## 2016-02-16 ENCOUNTER — Encounter (INDEPENDENT_AMBULATORY_CARE_PROVIDER_SITE_OTHER): Payer: Self-pay

## 2016-02-19 ENCOUNTER — Encounter (INDEPENDENT_AMBULATORY_CARE_PROVIDER_SITE_OTHER): Payer: Self-pay

## 2016-03-07 ENCOUNTER — Encounter
Admission: RE | Disposition: A | Discharge status: Home / Self Care | Payer: Self-pay | Source: Ambulatory Visit | Attending: Vascular Surgery

## 2016-03-07 ENCOUNTER — Ambulatory Visit
Admission: RE | Admit: 2016-03-07 | Discharge: 2016-03-07 | Disposition: A | Discharge status: Home / Self Care | Payer: Medicare Other | Source: Ambulatory Visit | Attending: Vascular Surgery | Admitting: Vascular Surgery

## 2016-03-07 DIAGNOSIS — Z9884 Bariatric surgery status: Secondary | ICD-10-CM | POA: Insufficient documentation

## 2016-03-07 DIAGNOSIS — Z992 Dependence on renal dialysis: Secondary | ICD-10-CM | POA: Diagnosis not present

## 2016-03-07 DIAGNOSIS — F17200 Nicotine dependence, unspecified, uncomplicated: Secondary | ICD-10-CM | POA: Insufficient documentation

## 2016-03-07 DIAGNOSIS — Z91040 Latex allergy status: Secondary | ICD-10-CM | POA: Diagnosis not present

## 2016-03-07 DIAGNOSIS — Z9889 Other specified postprocedural states: Secondary | ICD-10-CM | POA: Insufficient documentation

## 2016-03-07 DIAGNOSIS — I120 Hypertensive chronic kidney disease with stage 5 chronic kidney disease or end stage renal disease: Secondary | ICD-10-CM | POA: Diagnosis not present

## 2016-03-07 DIAGNOSIS — Z880 Allergy status to penicillin: Secondary | ICD-10-CM | POA: Insufficient documentation

## 2016-03-07 DIAGNOSIS — I10 Essential (primary) hypertension: Secondary | ICD-10-CM

## 2016-03-07 DIAGNOSIS — Z881 Allergy status to other antibiotic agents status: Secondary | ICD-10-CM | POA: Insufficient documentation

## 2016-03-07 DIAGNOSIS — E1122 Type 2 diabetes mellitus with diabetic chronic kidney disease: Secondary | ICD-10-CM | POA: Diagnosis not present

## 2016-03-07 DIAGNOSIS — Z452 Encounter for adjustment and management of vascular access device: Secondary | ICD-10-CM | POA: Diagnosis not present

## 2016-03-07 DIAGNOSIS — N186 End stage renal disease: Secondary | ICD-10-CM | POA: Diagnosis not present

## 2016-03-07 DIAGNOSIS — E119 Type 2 diabetes mellitus without complications: Secondary | ICD-10-CM

## 2016-03-07 HISTORY — PX: DIALYSIS/PERMA CATHETER REMOVAL: CATH118289

## 2016-03-07 SURGERY — DIALYSIS/PERMA CATHETER REMOVAL
Anesthesia: Moderate Sedation

## 2016-03-07 MED ORDER — MIDAZOLAM HCL 2 MG/2ML IJ SOLN: 8.0000 mg | Freq: Once | INTRAMUSCULAR | Status: DC

## 2016-03-07 MED ORDER — MIDAZOLAM HCL 2 MG/ML PO SYRP
ORAL_SOLUTION | ORAL | Status: AC
  Filled 2016-03-07: qty 4

## 2016-03-07 MED ORDER — MIDAZOLAM HCL 2 MG/ML PO SYRP
8.0000 mg | ORAL_SOLUTION | Freq: Once | ORAL | Status: AC
  Administered 2016-03-07: 8 mg via ORAL

## 2016-03-07 SURGICAL SUPPLY — 2 items
TRAY LACERAT/PLASTIC (MISCELLANEOUS) ×2 IMPLANT
TRAY SUT REMOVAL LITTAUER SCS (KITS) ×2 IMPLANT

## 2016-03-07 NOTE — Op Note (Signed)
Operative Note     Preoperative diagnosis:   1. ESRD with functional permanent access  Postoperative diagnosis:  1. ESRD with functional permanent access  Procedure:  Removal of right internal jugular Permcath  Surgeon:  Leotis Pain, MD  Anesthesia:  Local  EBL:  Minimal  Indication for the Procedure:  The patient presents today for removal of her PermCath. She has a fistula in her left arm which is working well and no longer needs the PermCath. Risks and benefits of the procedure have been discussed.  Description of the Procedure:  The patient's right neck, chest and existing catheter were sterilely prepped and draped. The area around the catheter was anesthetized copiously with 1% lidocaine. The catheter was dissected out with curved hemostats until the cuff was freed from the surrounding fibrous sheath. The fiber sheath was transected, and the catheter was then removed in its entirety using gentle traction. Pressure was held and sterile dressings were placed. The patient tolerated the procedure well and was taken to the recovery room in stable condition.     Leotis Pain  03/07/2016, 11:19 AM This note was created with Dragon Medical transcription system. Any errors in dictation are purely unintentional.

## 2016-03-07 NOTE — H&P (Signed)
[facility] Admission History & Physical  MRN : [id number]  [name] is a 31 y.o. (1985-02-15) female who presents with chief complaint of No chief complaint on file. [name] Kitchen  History of Present Illness: Patient here with a functional dialysis access placed about 6 months ago. It is now working well after previous interventions and some previous difficulties with the access. She no longer needs her PermCath and desires to have this removed. She has no complaints today. She denies fever or chills.  No current facility-administered medications for this encounter.     Past Medical History:  Diagnosis Date  . Asthma   . Diabetes mellitus   . Dysrhythmia   . 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 Hypertension   . Irregular heart beat   . Morbid obesity ([place])   . Renal insufficiency     Past Surgical History:  Procedure Laterality Date  . AV FISTULA PLACEMENT  08/26/10   Left Radiocephalic AVF  . AV FISTULA PLACEMENT  08/26/2015   Procedure: ARTERIOVENOUS (AV) FISTULA CREATION;  Surgeon: Algernon Huxley, MD;  Location: [facility];  Service: Vascular;;  . EYE SURGERY     laser photocoagulation  . EYE SURGERY Right 2008   removal of blood clot  . INTRAUTERINE DEVICE (IUD) INSERTION    . PERIPHERAL VASCULAR CATHETERIZATION N/A 07/07/2014   Procedure: A/V Shuntogram/Fistulagram;  Surgeon: Algernon Huxley, MD;  Location: [facility];  Service: Cardiovascular;  Laterality: N/A;  . PERIPHERAL VASCULAR CATHETERIZATION N/A 07/07/2014   Procedure: A/V Shunt Intervention;  Surgeon: Algernon Huxley, MD;  Location: [facility] [facility];  Service: Cardiovascular;  Laterality: N/A;  . PERIPHERAL VASCULAR CATHETERIZATION Right 01/12/2015   Procedure: A/V Shuntogram/Fistulagram;  Surgeon: Algernon Huxley, MD;  Location: [facility] [facility];  Service: Cardiovascular;  Laterality: Right;  . PERIPHERAL VASCULAR CATHETERIZATION N/A 01/12/2015   Procedure: A/V Shunt  Intervention;  Surgeon: Algernon Huxley, MD;  Location: [place] [facility];  Service: Cardiovascular;  Laterality: N/A;  . PERIPHERAL VASCULAR CATHETERIZATION Right 06/08/2015   Procedure: A/V Shuntogram/Fistulagram;  Surgeon: Algernon Huxley, MD;  Location: [place] [facility];  Service: Cardiovascular;  Laterality: Right;  . PERIPHERAL VASCULAR CATHETERIZATION N/A 06/08/2015   Procedure: A/V Shunt Intervention;  Surgeon: Algernon Huxley, MD;  Location: [place] [facility];  Service: Cardiovascular;  Laterality: N/A;  . PERIPHERAL VASCULAR CATHETERIZATION Right 07/14/2015   Procedure: Thrombectomy;  Surgeon: Katha Cabal, MD;  Location: [place] [facility];  Service: Cardiovascular;  Laterality: Right;  . PERIPHERAL VASCULAR CATHETERIZATION N/A 07/14/2015   Procedure: A/V Shuntogram/Fistulagram;  Surgeon: Katha Cabal, MD;  Location: [place] [facility];  Service: Cardiovascular;  Laterality: N/A;  . PERIPHERAL VASCULAR CATHETERIZATION N/A 07/17/2015   Procedure: Thrombectomy;  Surgeon: Katha Cabal, MD;  Location: [place] [facility];  Service: Cardiovascular;  Laterality: N/A;  . PERIPHERAL VASCULAR CATHETERIZATION N/A 07/17/2015   Procedure: Dialysis/Perma Catheter Insertion;  Surgeon: Katha Cabal, MD;  Location: [facility] [facility];  Service: Cardiovascular;  Laterality: N/A;  . PERIPHERAL VASCULAR CATHETERIZATION N/A 08/05/2015   Procedure: Thrombectomy;  Surgeon: Katha Cabal, MD;  Location: [place] [facility];  Service: Cardiovascular;  Laterality: N/A;  declot of graft    Social History Social History  Substance Use Topics  . Smoking status: Light Tobacco Smoker    Packs/day: 0.00    Years: 10.00    Types: Cigarettes  .  Smokeless tobacco: Never Used  . Alcohol use No  No IV drug use  Family History No bleeding disorders, clotting disorders, autoimmune diseases, or aneurysms  Allergies  Allergen Reactions  . Marcillin [Ampicillin] Rash and Other (See Comments)     Breathing and heart palp  Hot and sweating Has patient had a PCN reaction causing immediate rash, facial/tongue/throat swelling, SOB or lightheadedness with hypotension: Yes Has patient had a PCN reaction causing severe rash involving mucus membranes or skin necrosis: No Has patient had a PCN reaction that required hospitalization Unknown Has patient had a PCN reaction occurring within the last 10 years: Yes If all of the above answers are "NO", then may proceed with Cephalosporin use.   . Latex Swelling and Rash  . Vancomycin Itching, Nausea Only and Rash    Thick pieces of skin pulls off.     REVIEW OF SYSTEMS (Negative unless checked)  Constitutional: [x] Weight loss  [] Fever  [] Chills Cardiac: [] Chest pain   [] Chest pressure   [] Palpitations   [] Shortness of breath when laying flat   [] Shortness of breath at rest   [] Shortness of breath with exertion. Vascular:  [] Pain in legs with walking   [] Pain in legs at rest   [] Pain in legs when laying flat   [] Claudication   [] Pain in feet when walking  [] Pain in feet at rest  [] Pain in feet when laying flat   [] History of DVT   [] Phlebitis   [x] Swelling in legs   [] Varicose veins   [] Non-healing ulcers Pulmonary:   [] Uses home oxygen   [] Productive cough   [] Hemoptysis   [] Wheeze  [] COPD   [] Asthma Neurologic:  [] Dizziness  [] Blackouts   [] Seizures   [] History of stroke   [] History of TIA  [] Aphasia   [] Temporary blindness   [] Dysphagia   [] Weakness or numbness in arms   [] Weakness or numbness in legs Musculoskeletal:  [] Arthritis   [] Joint swelling   [] Joint pain   [] Low back pain Hematologic:  [] Easy bruising  [] Easy bleeding   [] Hypercoagulable state   [x] Anemic  [] Hepatitis Gastrointestinal:  [] Blood in stool   [] Vomiting blood  [] Gastroesophageal reflux/heartburn   [] Difficulty swallowing. Genitourinary:  [] Chronic kidney disease   [] Difficult urination  [] Frequent urination  [] Burning with urination   [] Blood in urine Skin:  [] Rashes    [] Ulcers   [] Wounds Psychological:  [] History of anxiety   []  History of major depression.  Physical Examination  There were no vitals filed for this visit. There is no height or weight on file to calculate BMI. Gen: WD/WN, NAD Head: [place]/AT, No temporalis wasting. Prominent temp pulse not noted. Ear/Nose/Throat: Hearing grossly intact, nares w/o erythema or drainage, oropharynx w/o Erythema/Exudate,  Eyes: Conjunctiva clear, sclera non-icteric Neck: Trachea midline.  No JVD.  Pulmonary:  Good air movement, respirations not labored, no use of accessory muscles.  Cardiac: RRR, normal S1, S2. Vascular: Good thrill and bruit are present and left arm AV fistula Vessel Right Left  Radial Palpable Palpable                                   Gastrointestinal: soft, non-tender/non-distended. No guarding/reflex.  Musculoskeletal: M/S 5/5 throughout.  Extremities without ischemic changes.  No deformity or atrophy.  Neurologic: Sensation grossly intact in extremities.  Symmetrical.  Speech is fluent. Motor exam as listed above. Psychiatric: Judgment intact, Mood & affect appropriate for pt's clinical situation. Dermatologic: No rashes  or ulcers noted.  No cellulitis or open wounds. Lymph : No Cervical, Axillary, or Inguinal lymphadenopathy.     CBC Lab Results  Component Value Date   WBC 9.3 11/09/2015   HGB 12.6 11/09/2015   HCT 36.8 11/09/2015   MCV 86.1 11/09/2015   PLT 184 11/09/2015    BMET    Component Value Date/Time   NA 138 11/09/2015 0350   NA 132 (L) 02/03/2014 1007   K 4.8 11/09/2015 0350   K 4.7 02/03/2014 1007   CL 103 11/09/2015 0350   CL 93 (L) 02/03/2014 1007   CO2 24 11/09/2015 0350   CO2 25 02/03/2014 1007   GLUCOSE 103 (H) 11/09/2015 0350   GLUCOSE 258 (H) 02/03/2014 1007   BUN 47 (H) 11/09/2015 0350   BUN 58 (H) 02/03/2014 1007   CREATININE 12.30 (H) 11/09/2015 0350   CREATININE 11.14 (H) 02/03/2014 1007   CALCIUM 9.6 11/09/2015 0350   CALCIUM  8.4 (L) 02/03/2014 1007   CALCIUM 8.1 (L) 08/23/2010 1000   GFRNONAA 4 (L) 11/09/2015 0350   GFRNONAA 4 (L) 02/03/2014 1007   GFRNONAA 5 (L) 07/19/2013 0416   GFRAA 4 (L) 11/09/2015 0350   GFRAA 5 (L) 02/03/2014 1007   GFRAA 6 (L) 07/19/2013 0416   CrCl cannot be calculated (Patient's most recent lab result is older than the maximum 21 days allowed.).  COAG Lab Results  Component Value Date   INR 1.11 08/19/2015   INR 1.1 04/08/2013   INR 0.9 03/23/2011    Radiology No results found.    Assessment/Plan 1. ESRD with functional dialysis access. Here for PermCath removal today. Risks and benefits discussed. 2. Hypertension. Stable on outpatient medications. blood pressure control important in reducing the progression of atherosclerotic disease. On appropriate oral medications. 3. Diabetes. Stable on outpatient medications. blood glucose control important in reducing the progression of atherosclerotic disease. Also, involved in wound healing. On appropriate medications.    Leotis Pain, MD  03/07/2016 10:22 AM

## 2016-03-08 ENCOUNTER — Encounter: Payer: Self-pay | Admitting: Vascular Surgery

## 2016-04-08 IMAGING — CR LEFT GREAT TOE
1 series · 3 of 3 positions shown · non-contrast
Comparison: None.

CLINICAL DATA: Diabetic wound.

EXAM:
LEFT GREAT TOE

[Series 1: oblique · 0.17mm/px · 3 of 3 slices shown]
[im 1/3]
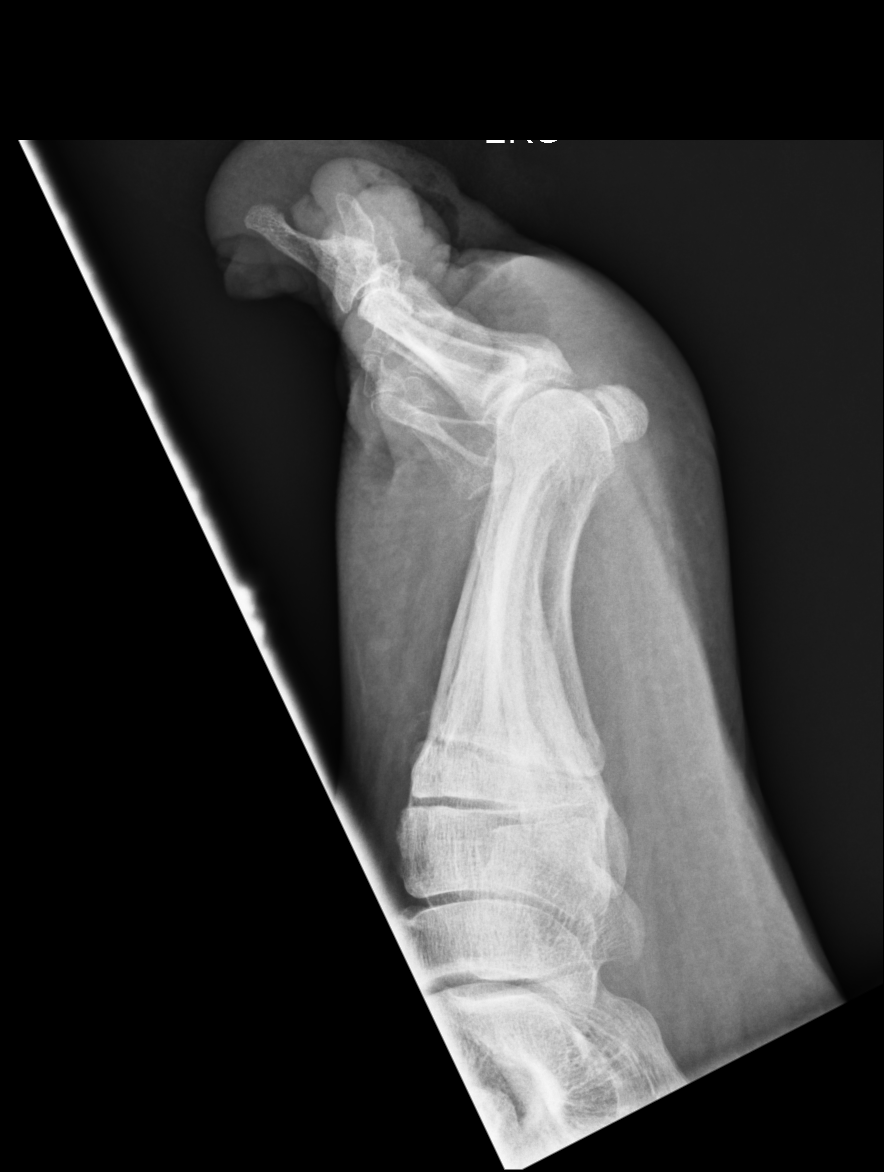
[im 2/3]
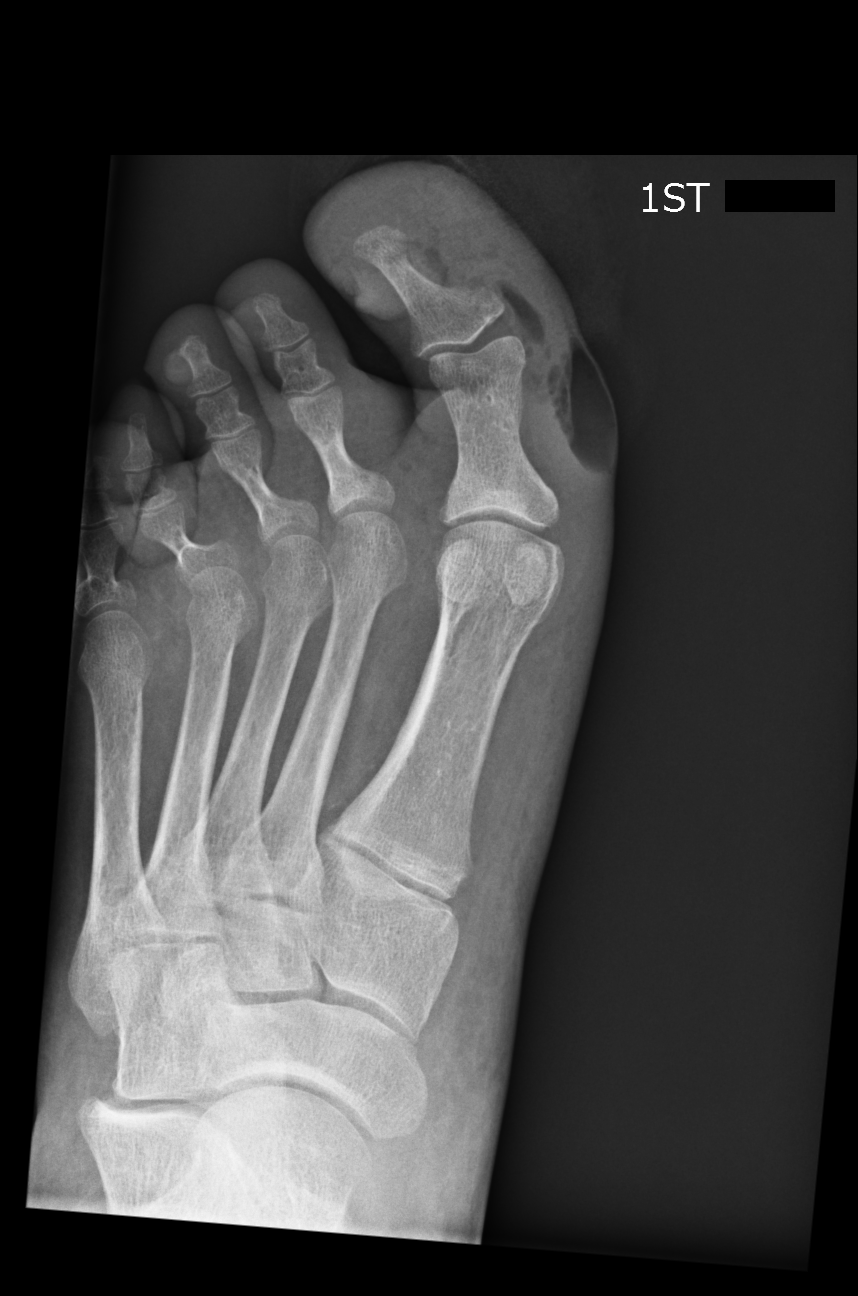
[im 3/3]
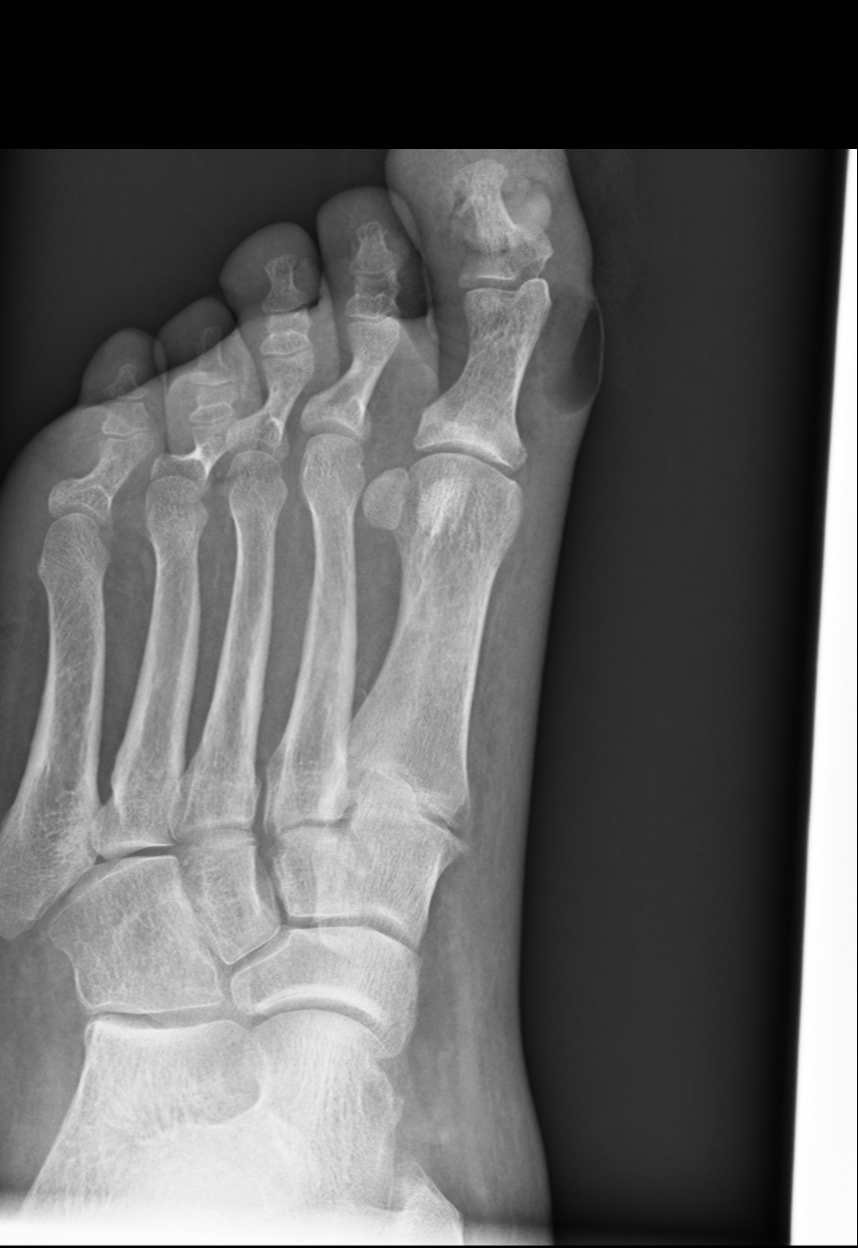

[3 of 3 positions shown; findings below may reference images not displayed]

FINDINGS: There is a air over the medial/plantar soft tissues of the first toe
compatible patient's known soft tissue ulceration/wound in this
region. Is to ill definition of the medial cortex of the base of the
distal first phalanx as cannot exclude osteomyelitis. Remainder the
exam is unremarkable.
IMPRESSION: Changes over the soft tissues of the first toe compatible patient's
known ulceration/bones. Cannot exclude osteomyelitis involving the
base of the first distal phalanx.

## 2016-04-10 IMAGING — MR MRI OF THE LEFT FOREFOOT WITHOUT CONTRAST
4 of 5 series · 30 of 40 positions shown · non-contrast
Comparison: Plain films 07/15/2013

CLINICAL DATA: Ulceration left great toe in a diabetic patient.

EXAM:
MRI OF THE LEFT FOREFOOT WITHOUT CONTRAST
TECHNIQUE: Multiplanar, multisequence MR imaging was performed. No intravenous
contrast was administered.

[Series 5: T1 · coronal · 3.0mm · 0.75mm/px · 8 of 35 slices shown (1 of 2)]
[im 1/35]
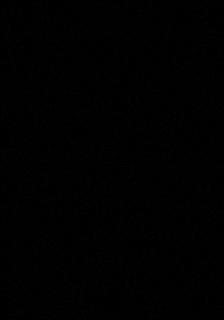
[im 5/35]
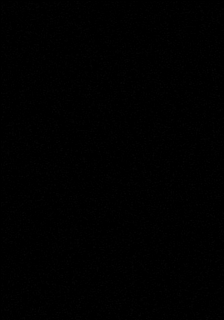
[im 10/35]
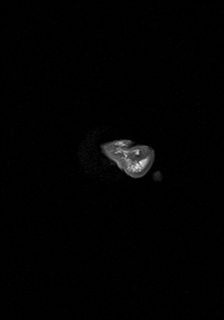
[im 15/35]
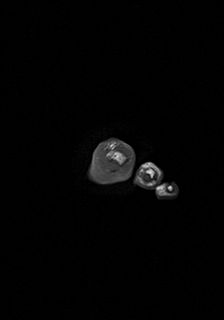
[im 20/35]
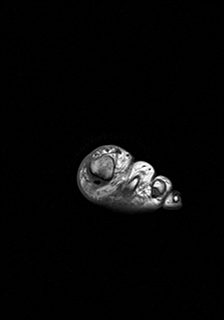
[im 25/35]
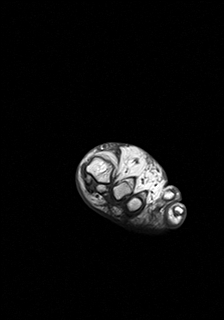
[im 30/35]
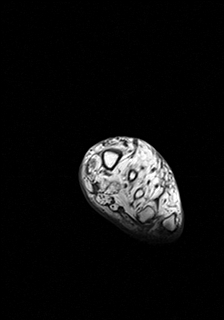
[im 35/35]
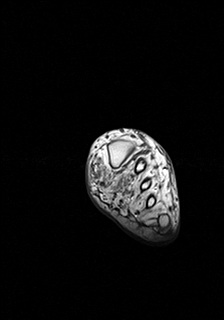

[Series 6: T2 fat-sat · coronal · 3.0mm · 0.43mm/px · 8 of 35 slices shown]
[im 1/35]
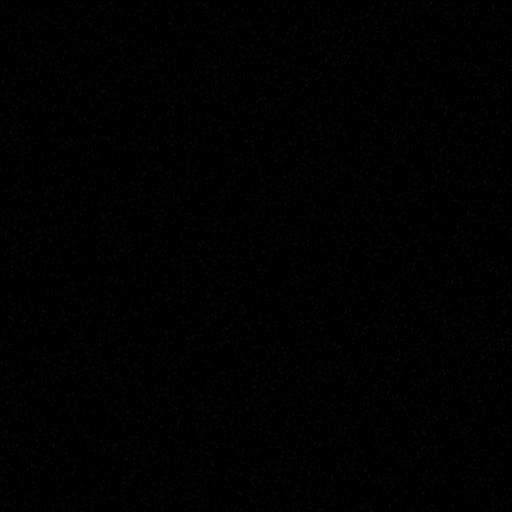
[im 5/35]
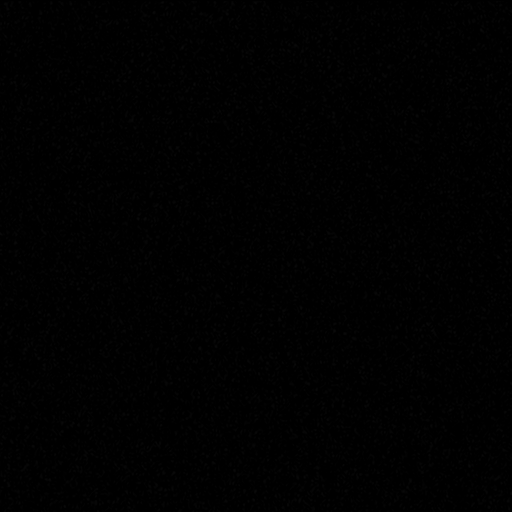
[im 10/35]
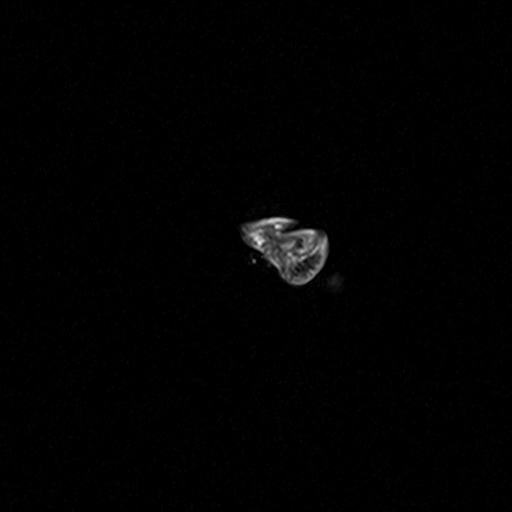
[im 15/35]
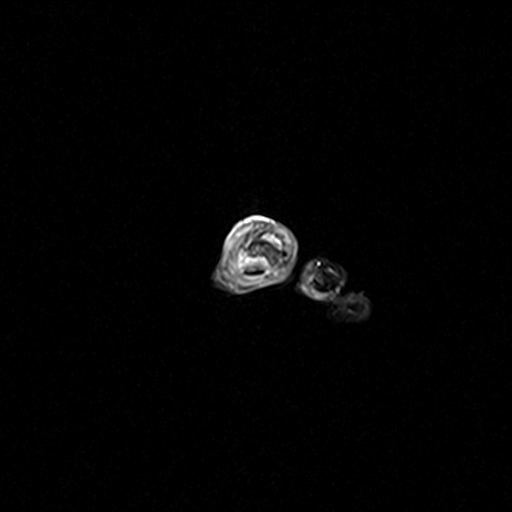
[im 20/35]
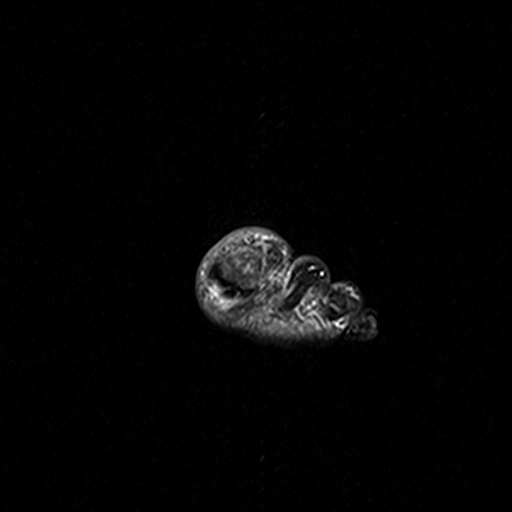
[im 25/35]
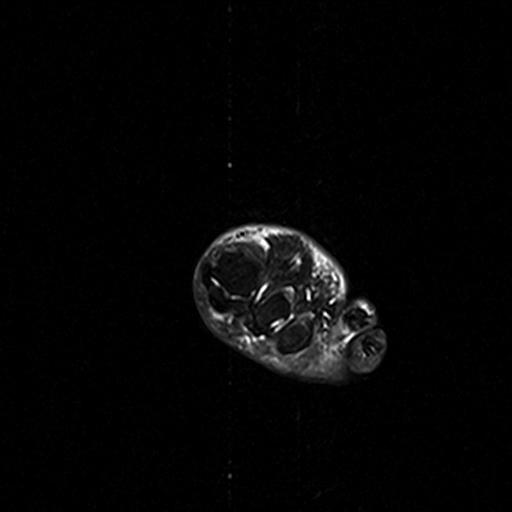
[im 30/35]
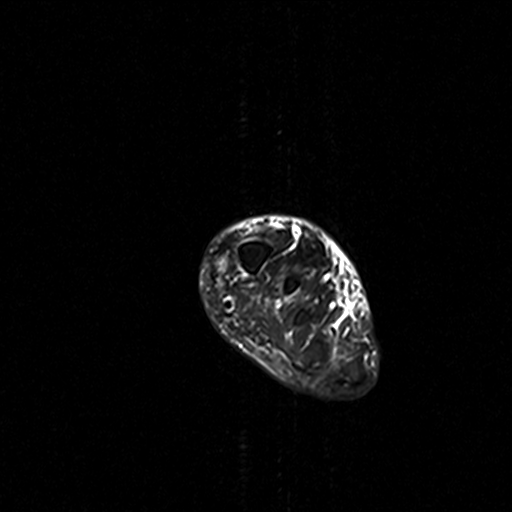
[im 35/35]
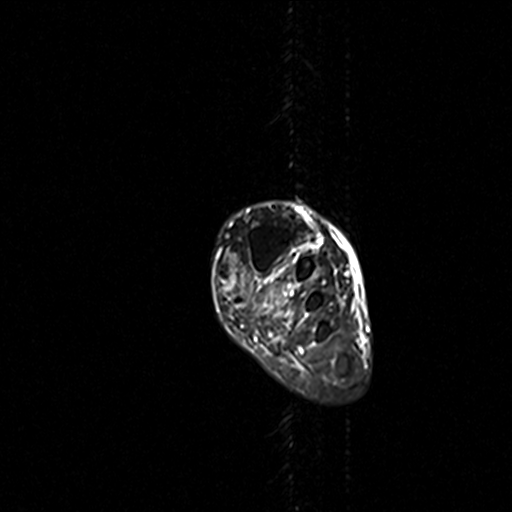

[Series 7: T1 · oblique · 3.0mm · 0.78mm/px · 8 of 33 slices shown (2 of 2)]
[im 1/33]
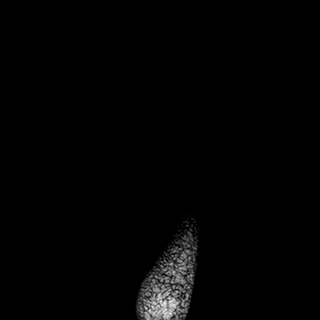
[im 5/33]
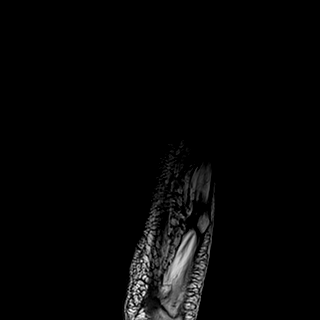
[im 10/33]
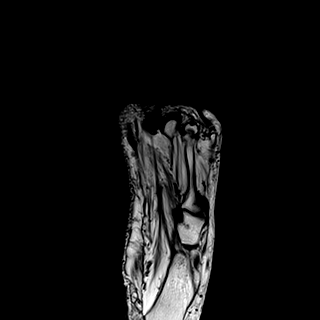
[im 14/33]
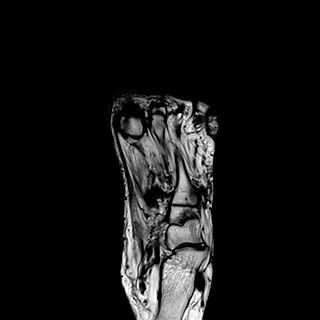
[im 19/33]
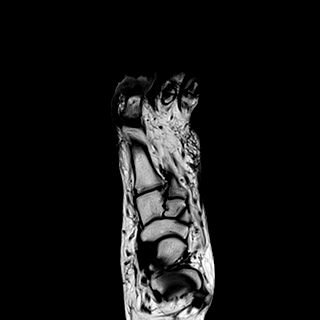
[im 23/33]
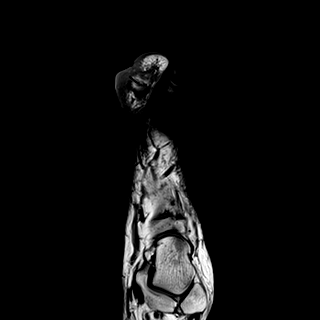
[im 28/33]
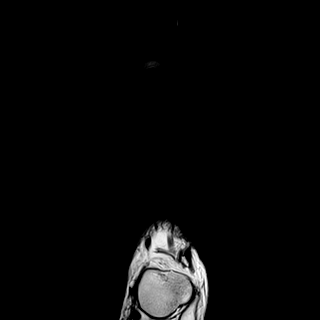
[im 33/33]
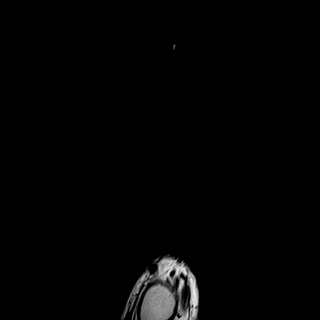

[Series 8: STIR · oblique · 3.0mm · 0.49mm/px · 6 of 33 slices shown]
[im 1/33]
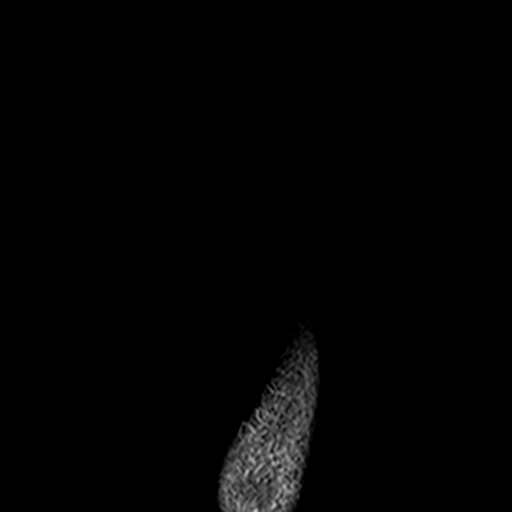
[im 5/33]
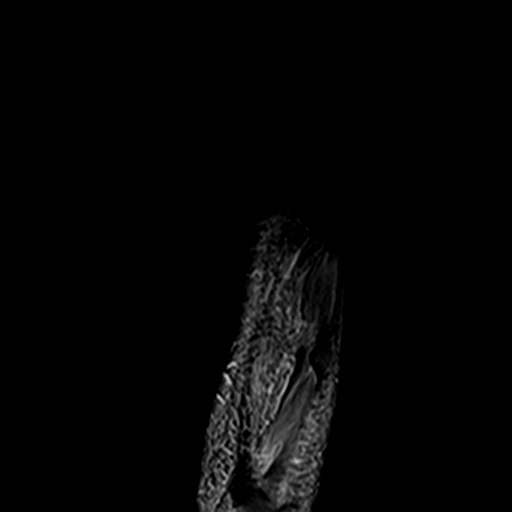
[im 10/33]
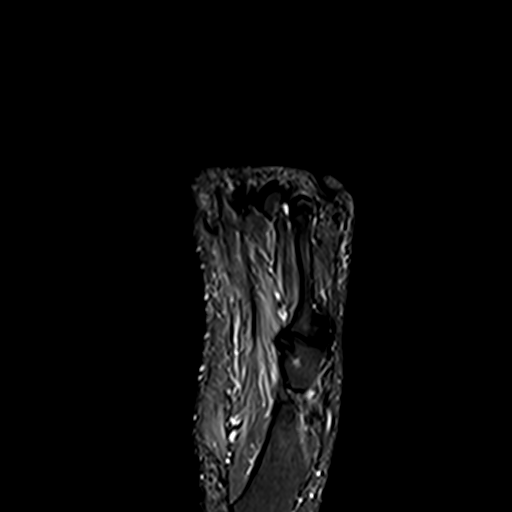
[im 14/33]
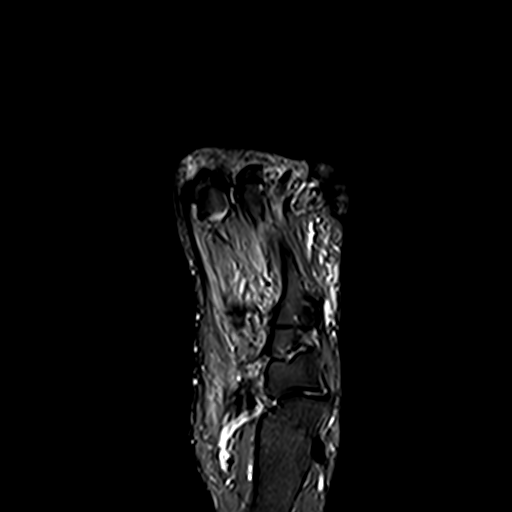
[im 19/33]
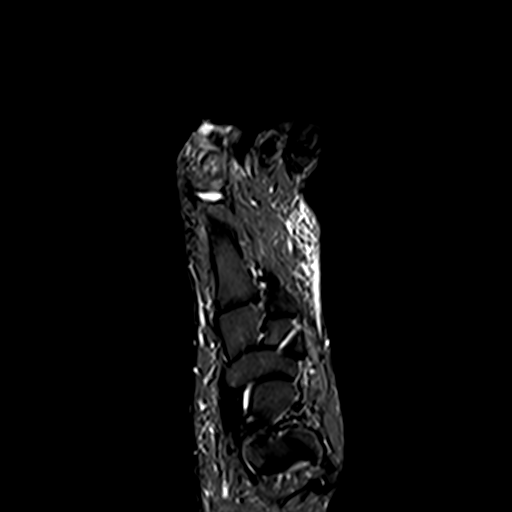
[im 28/33]
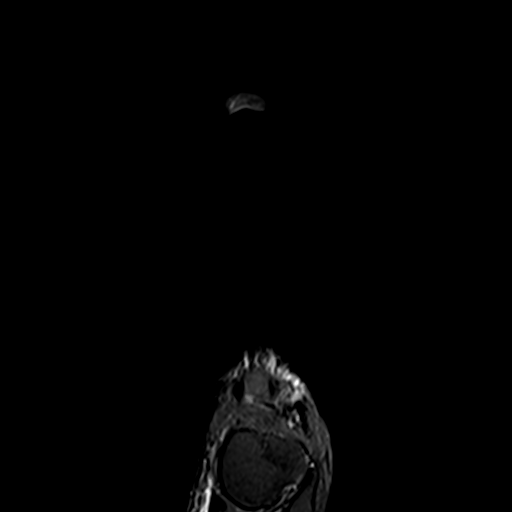

[30 of 40 positions shown; findings below may reference images not displayed]

FINDINGS: A large soft tissue wound is seen about the medial and plantar
aspect of the great toe. No focal fluid collection is identified
with subcutaneous edema over the dorsum of the foot and diffusely
about the great toe identified. Subtle foci decreased T1 signal are
seen in the proximal and distal phalanges of the great toe with
associated mild marrow edema. Bone marrow signal is otherwise
unremarkable. No mass is identified. Intrinsic musculature appears
normal.
IMPRESSION: Large soft tissue wound about the great toe findings worrisome for
osteomyelitis in the proximal and distal phalanges.

## 2016-04-22 ENCOUNTER — Other Ambulatory Visit (INDEPENDENT_AMBULATORY_CARE_PROVIDER_SITE_OTHER): Payer: Self-pay | Admitting: Vascular Surgery

## 2016-04-22 DIAGNOSIS — N186 End stage renal disease: Secondary | ICD-10-CM

## 2016-04-27 ENCOUNTER — Ambulatory Visit (INDEPENDENT_AMBULATORY_CARE_PROVIDER_SITE_OTHER): Payer: Medicare Other | Admitting: Vascular Surgery

## 2016-04-27 ENCOUNTER — Ambulatory Visit (INDEPENDENT_AMBULATORY_CARE_PROVIDER_SITE_OTHER): Payer: Medicare Other

## 2016-04-27 ENCOUNTER — Encounter (INDEPENDENT_AMBULATORY_CARE_PROVIDER_SITE_OTHER): Payer: Self-pay | Admitting: Vascular Surgery

## 2016-04-27 VITALS — BP 121/81 | HR 101 | Resp 18 | Wt 218.0 lb

## 2016-04-27 DIAGNOSIS — T829XXA Unspecified complication of cardiac and vascular prosthetic device, implant and graft, initial encounter: Secondary | ICD-10-CM | POA: Diagnosis not present

## 2016-04-27 DIAGNOSIS — E1122 Type 2 diabetes mellitus with diabetic chronic kidney disease: Secondary | ICD-10-CM | POA: Diagnosis not present

## 2016-04-27 DIAGNOSIS — Z992 Dependence on renal dialysis: Secondary | ICD-10-CM | POA: Diagnosis not present

## 2016-04-27 DIAGNOSIS — N186 End stage renal disease: Secondary | ICD-10-CM | POA: Diagnosis not present

## 2016-04-27 NOTE — Progress Notes (Signed)
Subjective:    Patient ID: [name], female    DOB: 1985/08/05, 31 y.o.   MRN: [id number] Chief Complaint  Patient presents with  . Follow-up   Patient presents for a HDA follow up. Last seen in the hospital during a permcath removal on 03/17/16. She is without complaint today. Permcath site has healed. The patient underwent a duplex ultrasound of the AV access which was notable for a patent fistula without any significant hemodynamic stenosis. The patient denies any issues with hemodialysis such as cannulation problems, increased bleeding, decrease in doppler flow or recirculation. The patient also denies any fistula skin breakdown, pain, edema, pallor or ulceration of the arm / hand.     Review of Systems  Constitutional: Negative.   HENT: Negative.   Eyes: Negative.   Respiratory: Negative.   Cardiovascular: Negative.   Gastrointestinal: Negative.   Endocrine: Negative.   Genitourinary: Negative.   Musculoskeletal: Negative.   Skin: Negative.   Allergic/Immunologic: Negative.   Neurological: Negative.   Hematological: Negative.   Psychiatric/Behavioral: Negative.        Objective:   Physical Exam  Constitutional: She is oriented to person, place, and time. She appears well-developed and well-nourished.  HENT:  Head: Normocephalic and atraumatic.  Eyes: Conjunctivae are normal. Pupils are equal, round, and reactive to light.  Neck: Normal range of motion.  Cardiovascular: Normal rate, regular rhythm and normal heart sounds.   Pulses:      Radial pulses are 2+ on the right side, and 2+ on the left side.  Right Upper Extremity Fistula: Good bruit and thrill.   Pulmonary/Chest: Effort normal.  Musculoskeletal: Normal range of motion. She exhibits no edema.  Neurological: She is alert and oriented to person, place, and time.  Skin: Skin is warm and dry.  Permcath site healed.   Psychiatric: She has a normal mood and affect. Her behavior is normal. Judgment and  thought content normal.   BP 121/81   Pulse (!) 101   Resp 18   Wt 218 lb (98.9 kg)   BMI 37.42 kg/m   Past Medical History:  Diagnosis Date  . Asthma   . Diabetes mellitus   . Dysrhythmia   . 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 Hypertension   . Irregular heart beat   . Morbid obesity ([place])   . Renal insufficiency    Social History   Social History  . Marital status: Single    Spouse name: N/A  . Number of children: N/A  . Years of education: N/A   Occupational History  . Not on file.   Social History Main Topics  . Smoking status: Light Tobacco Smoker    Packs/day: 0.00    Years: 10.00    Types: Cigarettes  . Smokeless tobacco: Never Used  . Alcohol use No  . Drug use: No  . Sexual activity: Yes    Birth control/ protection: IUD   Other Topics Concern  . Not on file   Social History Narrative  . No narrative on file   Past Surgical History:  Procedure Laterality Date  . AV FISTULA PLACEMENT  08/26/10   Left Radiocephalic AVF  . AV FISTULA PLACEMENT  08/26/2015   Procedure: ARTERIOVENOUS (AV) FISTULA CREATION;  Surgeon: Algernon Huxley, MD;  Location: [facility];  Service: Vascular;;  . DIALYSIS/PERMA CATHETER REMOVAL N/A 03/07/2016   Procedure: Dialysis/Perma Catheter Removal;  Surgeon: Algernon Huxley, MD;  Location: [facility];  Service: Cardiovascular;  Laterality: N/A;  . EYE SURGERY     laser photocoagulation  . EYE SURGERY Right 2008   removal of blood clot  . INTRAUTERINE DEVICE (IUD) INSERTION    . PERIPHERAL VASCULAR CATHETERIZATION N/A 07/07/2014   Procedure: A/V Shuntogram/Fistulagram;  Surgeon: Algernon Huxley, MD;  Location: [place] [facility];  Service: Cardiovascular;  Laterality: N/A;  . PERIPHERAL VASCULAR CATHETERIZATION N/A 07/07/2014   Procedure: A/V Shunt Intervention;  Surgeon: Algernon Huxley, MD;  Location: [place] [facility];  Service: Cardiovascular;  Laterality: N/A;  . PERIPHERAL VASCULAR CATHETERIZATION Right 01/12/2015     Procedure: A/V Shuntogram/Fistulagram;  Surgeon: Algernon Huxley, MD;  Location: [place] [facility];  Service: Cardiovascular;  Laterality: Right;  . PERIPHERAL VASCULAR CATHETERIZATION N/A 01/12/2015   Procedure: A/V Shunt Intervention;  Surgeon: Algernon Huxley, MD;  Location: [facility];  Service: Cardiovascular;  Laterality: N/A;  . PERIPHERAL VASCULAR CATHETERIZATION Right 06/08/2015   Procedure: A/V Shuntogram/Fistulagram;  Surgeon: Algernon Huxley, MD;  Location: [place] [facility];  Service: Cardiovascular;  Laterality: Right;  . PERIPHERAL VASCULAR CATHETERIZATION N/A 06/08/2015   Procedure: A/V Shunt Intervention;  Surgeon: Algernon Huxley, MD;  Location: [facility];  Service: Cardiovascular;  Laterality: N/A;  . PERIPHERAL VASCULAR CATHETERIZATION Right 07/14/2015   Procedure: Thrombectomy;  Surgeon: Katha Cabal, MD;  Location: [facility];  Service: Cardiovascular;  Laterality: Right;  . PERIPHERAL VASCULAR CATHETERIZATION N/A 07/14/2015   Procedure: A/V Shuntogram/Fistulagram;  Surgeon: Katha Cabal, MD;  Location: [facility];  Service: Cardiovascular;  Laterality: N/A;  . PERIPHERAL VASCULAR CATHETERIZATION N/A 07/17/2015   Procedure: Thrombectomy;  Surgeon: Katha Cabal, MD;  Location: [place] [facility];  Service: Cardiovascular;  Laterality: N/A;  . PERIPHERAL VASCULAR CATHETERIZATION N/A 07/17/2015   Procedure: Dialysis/Perma Catheter Insertion;  Surgeon: Katha Cabal, MD;  Location: [facility] [facility];  Service: Cardiovascular;  Laterality: N/A;  . PERIPHERAL VASCULAR CATHETERIZATION N/A 08/05/2015   Procedure: Thrombectomy;  Surgeon: Katha Cabal, MD;  Location: [facility] [facility];  Service: Cardiovascular;  Laterality: N/A;  declot of graft   No family history on file.  Allergies  Allergen Reactions  . Marcillin [Ampicillin] Rash and Other (See Comments)    Breathing and heart palp  Hot and sweating Has patient had a PCN  reaction causing immediate rash, facial/tongue/throat swelling, SOB or lightheadedness with hypotension: Yes Has patient had a PCN reaction causing severe rash involving mucus membranes or skin necrosis: No Has patient had a PCN reaction that required hospitalization Unknown Has patient had a PCN reaction occurring within the last 10 years: Yes If all of the above answers are "NO", then may proceed with Cephalosporin use.   . Latex Swelling and Rash  . Vancomycin Itching, Nausea Only and Rash    Thick pieces of skin pulls off.       Assessment & Plan:  Patient presents for a HDA follow up. Last seen in the hospital during a permcath removal on 03/17/16. She is without complaint today. Permcath site has healed. The patient underwent a duplex ultrasound of the AV access which was notable for a patent fistula without any significant hemodynamic stenosis. The patient denies any issues with hemodialysis such as cannulation problems, increased bleeding, decrease in doppler flow or recirculation. The patient also denies any fistula skin breakdown, pain, edema, pallor or ulceration of the arm / hand.    1. End  stage renal disease ([place]) - Stable Studies reviewed with patient. The patient is doing well and currently has adequate dialysis access. Duplex ultrasound of the AV access shows a patent access with no evidence of hemodynamically significant strictures or stenosis.  The patient should continue to have duplex ultrasounds of the dialysis access every six months. The patient was instructed to call the office in the interim if any issues with dialysis access / doppler flow, pain, edema, pallor, fistula skin breakdown or ulceration of the arm / hand occur. The patient expressed their understanding.  - VAS US DUPLEX DIALYSIS ACCESS (AVF,AVG); Future  2. Type 2 diabetes mellitus with chronic kidney disease on chronic dialysis, unspecified long term insulin use status ([place]) - Stable Encouraged good  control as its slows the progression of atherosclerotic disease  3. Complication from renal dialysis device, Sequela - Improvement Studies reviewed with patient. The patient is doing well and currently has adequate dialysis access. Duplex ultrasound of the AV access shows a patent access with no evidence of hemodynamically significant strictures or stenosis.  The patient should continue to have duplex ultrasounds of the dialysis access every three to four months. The patient was instructed to call the office in the interim if any issues with dialysis access / doppler flow, pain, edema, pallor, fistula skin breakdown or ulceration of the arm / hand occur. The patient expressed their understanding.  Current Outpatient Prescriptions on File Prior to Visit  Medication Sig Dispense Refill  . albuterol (PROVENTIL HFA;VENTOLIN HFA) 108 (90 Base) MCG/ACT inhaler Inhale 2 puffs into the lungs 2 (two) times daily as needed for wheezing or shortness of breath.     [name] Kitchen aspirin 81 MG tablet Take 81 mg by mouth daily.      . calcium acetate, Phos Binder, (PHOSLYRA) 667 MG/5ML SOLN Take 7.5 ml twice day with meals    . cinacalcet (SENSIPAR) 30 MG tablet Take 30 mg by mouth daily with supper.    . clindamycin (CLEOCIN) 150 MG capsule Take 3 capsules (450 mg total) by mouth 3 (three) times daily. (Patient taking differently: Take 300 mg by mouth as directed. 300mg s 1 hour prior to dental procedures) 90 capsule 0  . cyclobenzaprine (FLEXERIL) 10 MG tablet Take 1 tablet (10 mg total) by mouth 3 (three) times daily as needed for muscle spasms. 15 tablet 0  . darbepoetin (ARANESP, ALB FREE, SURECLICK) 097 DZH/2.9JM SOLN Inject 200 mcg into the skin every 7 (seven) days. Reported on 07/14/2015    . diazepam (VALIUM) 2 MG tablet Take 1 tablet (2 mg total) by mouth every 8 (eight) hours as needed for muscle spasms. 9 tablet 0  . docusate sodium (COLACE) 100 MG capsule Take 100 mg by mouth daily as needed for mild  constipation.     . insulin glargine (LANTUS) 100 UNIT/ML injection Inject 6 Units into the skin daily as needed (high blood sugar). If blood sugar is over 140    . nicotine (NICODERM CQ - DOSED IN MG/24 HOURS) 21 mg/24hr patch Place 21 mg onto the skin daily.     [name] Kitchen omeprazole (PRILOSEC) 20 MG capsule Take 20 mg by mouth daily.    [name] Kitchen OVER THE COUNTER MEDICATION Take 5 mLs by mouth daily. nutra burst multivitamin liquid    . oxyCODONE-acetaminophen (ROXICET) 5-325 MG tablet Take 1-2 tablets by mouth every 6 (six) hours as needed. 10 tablet 0  . pravastatin (PRAVACHOL) 40 MG tablet Take 40 mg by mouth at bedtime.     [name] Kitchen  promethazine (PHENERGAN) 12.5 MG tablet Take 12.5 mg by mouth every 6 (six) hours as needed for nausea or vomiting.    . traMADol (ULTRAM) 50 MG tablet Take 50 mg by mouth every 6 (six) hours as needed for moderate pain.     . metoprolol succinate (TOPROL-XL) 50 MG 24 hr tablet Take 50 mg by mouth at bedtime. Only takes this if the pharmacy accidentally fills this instead of the Lopressor. Patient looks at the mg and takes 50 mg daily or the 25 mg twice daily Take with or immediately following a meal.     No current facility-administered medications on file prior to visit.     There are no Patient Instructions on file for this visit. No Follow-up on file.   Tykwon Fera A Braylyn Eye, PA-C

## 2016-08-19 ENCOUNTER — Ambulatory Visit (INDEPENDENT_AMBULATORY_CARE_PROVIDER_SITE_OTHER): Payer: Medicare Other | Admitting: Vascular Surgery

## 2016-08-19 ENCOUNTER — Ambulatory Visit (INDEPENDENT_AMBULATORY_CARE_PROVIDER_SITE_OTHER): Payer: Medicare Other

## 2016-08-19 ENCOUNTER — Encounter (INDEPENDENT_AMBULATORY_CARE_PROVIDER_SITE_OTHER): Payer: Self-pay | Admitting: Vascular Surgery

## 2016-08-19 VITALS — BP 120/67 | HR 105 | Resp 17 | Wt 220.0 lb

## 2016-08-19 DIAGNOSIS — E1122 Type 2 diabetes mellitus with diabetic chronic kidney disease: Secondary | ICD-10-CM

## 2016-08-19 DIAGNOSIS — I10 Essential (primary) hypertension: Secondary | ICD-10-CM | POA: Diagnosis not present

## 2016-08-19 DIAGNOSIS — N186 End stage renal disease: Secondary | ICD-10-CM | POA: Diagnosis not present

## 2016-08-19 DIAGNOSIS — Z992 Dependence on renal dialysis: Secondary | ICD-10-CM

## 2016-08-19 NOTE — Assessment & Plan Note (Signed)
blood glucose control important in reducing the progression of atherosclerotic disease. Also, involved in wound healing. On appropriate medications.  

## 2016-08-19 NOTE — Assessment & Plan Note (Signed)
Duplex today shows some moderately elevated velocities between access sites although the degree of stenosis in this area does not appear to be particularly high-grade. 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 The remainder of the AV fistula appears normal. Given these findings, I don't think she is likely to benefit much from intervention at this time. She is reasonably high risk and has required multiple previous interventions, so a short interval follow-up would be recommended. I will see her back in 3-4 months.

## 2016-08-19 NOTE — Progress Notes (Signed)
MRN : [id number]  [name] is a 31 y.o. (August 06, 1985) female who presents with chief complaint of  Chief Complaint  Patient presents with  . ultrasound follow up  .  History of Present Illness: Patient returns today in follow up of her dialysis access. She reports that the flow rates have been down with dialysis. They are not in a critically low range, but are certainly reduced from several months ago. Duplex today shows some moderately elevated velocities between access sites although the degree of stenosis in this area does not appear to be particularly high-grade. 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 The remainder of the AV fistula appears normal.  Current Outpatient Prescriptions  Medication Sig Dispense Refill  . aspirin 81 MG tablet Take 81 mg by mouth daily.      . calcium acetate, Phos Binder, (PHOSLYRA) 667 MG/5ML SOLN Take 7.5 ml twice day with meals    . cinacalcet (SENSIPAR) 30 MG tablet Take 30 mg by mouth daily with supper.    . clindamycin (CLEOCIN) 150 MG capsule Take 3 capsules (450 mg total) by mouth 3 (three) times daily. (Patient taking differently: Take 300 mg by mouth as directed. 300mg s 1 hour prior to dental procedures) 90 capsule 0  . cyclobenzaprine (FLEXERIL) 10 MG tablet Take 1 tablet (10 mg total) by mouth 3 (three) times daily as needed for muscle spasms. 15 tablet 0  . darbepoetin (ARANESP, ALB FREE, SURECLICK) 517 OHY/0.7PX SOLN Inject 200 mcg into the skin every 7 (seven) days. Reported on 07/14/2015    . diazepam (VALIUM) 2 MG tablet Take 1 tablet (2 mg total) by mouth every 8 (eight) hours as needed for muscle spasms. 9 tablet 0  . docusate sodium (COLACE) 100 MG capsule Take 100 mg by mouth daily as needed for mild constipation.     . insulin glargine (LANTUS) 100 UNIT/ML injection Inject 6 Units into the skin daily as needed (high blood sugar). If blood sugar is over 140    . metoprolol succinate (TOPROL-XL) 50 MG 24 hr tablet Take  50 mg by mouth at bedtime. Only takes this if the pharmacy accidentally fills this instead of the Lopressor. Patient looks at the mg and takes 50 mg daily or the 25 mg twice daily Take with or immediately following a meal.    . nicotine (NICODERM CQ - DOSED IN MG/24 HOURS) 21 mg/24hr patch Place 21 mg onto the skin daily.     [name] Kitchen omeprazole (PRILOSEC) 20 MG capsule Take 20 mg by mouth daily.    [name] Kitchen OVER THE COUNTER MEDICATION Take 5 mLs by mouth daily. nutra burst multivitamin liquid    . oxyCODONE-acetaminophen (ROXICET) 5-325 MG tablet Take 1-2 tablets by mouth every 6 (six) hours as needed. 10 tablet 0  . pravastatin (PRAVACHOL) 40 MG tablet Take 40 mg by mouth at bedtime.     . promethazine (PHENERGAN) 12.5 MG tablet Take 12.5 mg by mouth every 6 (six) hours as needed for nausea or vomiting.    . traMADol (ULTRAM) 50 MG tablet Take 50 mg by mouth every 6 (six) hours as needed for moderate pain.     [name] Kitchen albuterol (PROVENTIL HFA;VENTOLIN HFA) 108 (90 Base) MCG/ACT inhaler Inhale 2 puffs into the lungs 2 (two) times daily as needed for wheezing or shortness of breath.      No current facility-administered medications for this visit.     Past Medical History:  Diagnosis Date  . Asthma   .  Diabetes mellitus   . Dysrhythmia   . 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 Hypertension   . Irregular heart beat   . Morbid obesity ([place])   . Renal insufficiency     Past Surgical History:  Procedure Laterality Date  . AV FISTULA PLACEMENT  08/26/10   Left Radiocephalic AVF  . AV FISTULA PLACEMENT  08/26/2015   Procedure: ARTERIOVENOUS (AV) FISTULA CREATION;  Surgeon: Algernon Huxley, MD;  Location: [facility];  Service: Vascular;;  . DIALYSIS/PERMA CATHETER REMOVAL N/A 03/07/2016   Procedure: Dialysis/Perma Catheter Removal;  Surgeon: Algernon Huxley, MD;  Location: [place] [facility];  Service: Cardiovascular;  Laterality: N/A;  . EYE SURGERY     laser photocoagulation  . EYE SURGERY Right 2008    removal of blood clot  . INTRAUTERINE DEVICE (IUD) INSERTION    . PERIPHERAL VASCULAR CATHETERIZATION N/A 07/07/2014   Procedure: A/V Shuntogram/Fistulagram;  Surgeon: Algernon Huxley, MD;  Location: [place] [facility];  Service: Cardiovascular;  Laterality: N/A;  . PERIPHERAL VASCULAR CATHETERIZATION N/A 07/07/2014   Procedure: A/V Shunt Intervention;  Surgeon: Algernon Huxley, MD;  Location: [place] [facility];  Service: Cardiovascular;  Laterality: N/A;  . PERIPHERAL VASCULAR CATHETERIZATION Right 01/12/2015   Procedure: A/V Shuntogram/Fistulagram;  Surgeon: Algernon Huxley, MD;  Location: [place] [facility];  Service: Cardiovascular;  Laterality: Right;  . PERIPHERAL VASCULAR CATHETERIZATION N/A 01/12/2015   Procedure: A/V Shunt Intervention;  Surgeon: Algernon Huxley, MD;  Location: [place] [facility];  Service: Cardiovascular;  Laterality: N/A;  . PERIPHERAL VASCULAR CATHETERIZATION Right 06/08/2015   Procedure: A/V Shuntogram/Fistulagram;  Surgeon: Algernon Huxley, MD;  Location: [place] [facility];  Service: Cardiovascular;  Laterality: Right;  . PERIPHERAL VASCULAR CATHETERIZATION N/A 06/08/2015   Procedure: A/V Shunt Intervention;  Surgeon: Algernon Huxley, MD;  Location: [place] [facility];  Service: Cardiovascular;  Laterality: N/A;  . PERIPHERAL VASCULAR CATHETERIZATION Right 07/14/2015   Procedure: Thrombectomy;  Surgeon: Katha Cabal, MD;  Location: [place] [facility];  Service: Cardiovascular;  Laterality: Right;  . PERIPHERAL VASCULAR CATHETERIZATION N/A 07/14/2015   Procedure: A/V Shuntogram/Fistulagram;  Surgeon: Katha Cabal, MD;  Location: [place] [facility];  Service: Cardiovascular;  Laterality: N/A;  . PERIPHERAL VASCULAR CATHETERIZATION N/A 07/17/2015   Procedure: Thrombectomy;  Surgeon: Katha Cabal, MD;  Location: [place] [facility];  Service: Cardiovascular;  Laterality: N/A;  . PERIPHERAL VASCULAR CATHETERIZATION N/A 07/17/2015   Procedure: Dialysis/Perma Catheter  Insertion;  Surgeon: Katha Cabal, MD;  Location: [place] [facility];  Service: Cardiovascular;  Laterality: N/A;  . PERIPHERAL VASCULAR CATHETERIZATION N/A 08/05/2015   Procedure: Thrombectomy;  Surgeon: Katha Cabal, MD;  Location: [place] [facility];  Service: Cardiovascular;  Laterality: N/A;  declot of graft    Social History Social History  Substance Use Topics  . Smoking status: Light Tobacco Smoker    Packs/day: 0.00    Years: 10.00    Types: Cigarettes  . Smokeless tobacco: Never Used  . Alcohol use No    Family History No bleeding or clotting disorders  Allergies  Allergen Reactions  . Marcillin [Ampicillin] Rash and Other (See Comments)    Breathing and heart palp  Hot and sweating Has patient had a PCN reaction causing immediate rash, facial/tongue/throat swelling, SOB or lightheadedness with hypotension: Yes Has patient had a PCN reaction causing severe rash involving mucus membranes or skin necrosis: No Has patient had  a PCN reaction that required hospitalization Unknown Has patient had a PCN reaction occurring within the last 10 years: Yes If all of the above answers are "NO", then may proceed with Cephalosporin use.   . Latex Swelling and Rash  . Vancomycin Itching, Nausea Only and Rash    Thick pieces of skin pulls off.     REVIEW OF SYSTEMS (Negative unless checked)  Constitutional: [] Weight loss  [] Fever  [] Chills Cardiac: [] Chest pain   [] Chest pressure   [] Palpitations   [] Shortness of breath when laying flat   [] Shortness of breath at rest   [] Shortness of breath with exertion. Vascular:  [] Pain in legs with walking   [] Pain in legs at rest   [] Pain in legs when laying flat   [] Claudication   [] Pain in feet when walking  [] Pain in feet at rest  [] Pain in feet when laying flat   [] History of DVT   [] Phlebitis   [] Swelling in legs   [] Varicose veins   [] Non-healing ulcers Pulmonary:   [] Uses home oxygen   [] Productive cough   [] Hemoptysis    [] Wheeze  [] COPD   [] Asthma Neurologic:  [] Dizziness  [] Blackouts   [] Seizures   [] History of stroke   [] History of TIA  [] Aphasia   [] Temporary blindness   [] Dysphagia   [] Weakness or numbness in arms   [] Weakness or numbness in legs Musculoskeletal:  [] Arthritis   [] Joint swelling   [] Joint pain   [] Low back pain Hematologic:  [] Easy bruising  [] Easy bleeding   [] Hypercoagulable state   [] Anemic   Gastrointestinal:  [] Blood in stool   [] Vomiting blood  [] Gastroesophageal reflux/heartburn   [] Abdominal pain Genitourinary:  [x] Chronic kidney disease   [] Difficult urination  [] Frequent urination  [] Burning with urination   [] Hematuria Skin:  [] Rashes   [] Ulcers   [] Wounds Psychological:  [] History of anxiety   []  History of major depression.  Physical Examination  BP 120/67   Pulse (!) 105   Resp 17   Wt 220 lb (99.8 kg)   BMI 37.76 kg/m  Gen:  WD/WN, NAD. Obese.  Head: [place]/AT, No temporalis wasting. Ear/Nose/Throat: Hearing grossly intact, nares w/o erythema or drainage, trachea midline Eyes: Conjunctiva clear. Sclera non-icteric Neck: Supple.  No JVD.  Pulmonary:  Good air movement, no use of accessory muscles.  Cardiac: RRR, normal S1, S2 Vascular: soft thrill in left arm AVF Vessel Right Left  Radial Palpable Palpable                                    Musculoskeletal: M/S 5/5 throughout.  No deformity or atrophy. Mild lower extremity edema. Neurologic: Sensation grossly intact in extremities.  Symmetrical.  Speech is fluent.  Psychiatric: Judgment intact, Mood & affect appropriate for pt's clinical situation. Dermatologic: No rashes or ulcers noted.  No cellulitis or open wounds.       Labs No results found for this or any previous visit (from the past 2160 hour(s)).  Radiology No results found.    Assessment/Plan  Essential hypertension, benign blood pressure control important in reducing the progression of atherosclerotic disease. On appropriate oral  medications.   Diabetes ([place]) blood glucose control important in reducing the progression of atherosclerotic disease. Also, involved in wound healing. On appropriate medications.   End stage renal disease Duplex today shows some moderately elevated velocities between access sites although the degree of stenosis in this area does not appear  to be particularly high-grade. 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 The remainder of the AV fistula appears normal. Given these findings, I don't think she is likely to benefit much from intervention at this time. She is reasonably high risk and has required multiple previous interventions, so a short interval follow-up would be recommended. I will see her back in 3-4 months.    Leotis Pain, MD  08/19/2016 11:00 AM    This note was created with Dragon medical transcription system.  Any errors from dictation are purely unintentional

## 2016-08-19 NOTE — Patient Instructions (Signed)
Dialysis Vascular Access Malfunction A vascular access is an entrance to your blood vessels that can be used for dialysis. A vascular access can be made in one of several ways:  Joining an artery to a vein under your skin to make a bigger blood vessel called a fistula.  Joining an artery to a vein under your skin using a soft tube called a graft.  Placing a thin, flexible tube (catheter) in a large vein, usually in your neck.  A vascular access may malfunction or become blocked. What can cause your vascular access to malfunction?  Infection (common).  A blood clot inside a part of the fistula, graft, or catheter. A blood clot can completely or partially block the flow of blood.  A kink in the graft or catheter.  A collection of blood (called a hematoma or bruise) next to the graft or catheter that pushes against it, blocking the flow of blood. What are signs and symptoms of vascular access malfunction?  There is a change in the vibration or pulse of your fistula or graft.  The vibration or pulse of your fistula or graft is gone.  There is new or unusual swelling of the area around the access.  There was an unsuccessful puncture of your access by the dialysis team.  The flow of blood through the fistula, graft, or catheter is too slow for effective dialysis.  When routine dialysis is completed and the needle is removed, bleeding lasts for too long a time. What happens if my vascular access malfunctions? Your health care provider may order blood work, cultures, or an X-ray test in order to learn what may be wrong with your vascular access. The X-ray test involves the injection of a liquid into the vascular access. The liquid shows up on the X-ray and allows your health care provider to see if there is a blockage in the vascular access. Treatment varies depending on the cause of the malfunction:  If the vascular access is infected, your health care provider may prescribe antibiotic  medicine to control the infection.  If a clot is found in the vascular access, you may need surgery to remove the clot.  If a blockage in the vascular access is due to some other cause (such as a kink in a graft), then you will likely need surgery to unblock or replace the graft.  Follow these instructions at home: Follow up with your surgeon or other health care provider if you were instructed to do so. This is very important. Any delay in follow-up could cause permanent dysfunction of the vascular access, which may be dangerous. Contact a health care provider if:  Fever develops.  Swelling and pain around the vascular access gets worse or new pain develops.  Pain, numbness, or an unusual pale skin color develops in the hand on the side of your vascular access. Get help right away if: Unusual bleeding develops at the location of the vascular access. This information is not intended to replace advice given to you by your health care provider. Make sure you discuss any questions you have with your health care provider. Document Released: 12/20/2005 Document Revised: 06/25/2015 Document Reviewed: 06/21/2012 Elsevier Interactive Patient Education  2017 [employer]  

## 2016-08-19 NOTE — Assessment & Plan Note (Signed)
blood pressure control important in reducing the progression of atherosclerotic disease. On appropriate oral medications.  

## 2016-10-28 ENCOUNTER — Encounter (INDEPENDENT_AMBULATORY_CARE_PROVIDER_SITE_OTHER): Payer: Medicare Other

## 2016-10-28 ENCOUNTER — Ambulatory Visit (INDEPENDENT_AMBULATORY_CARE_PROVIDER_SITE_OTHER): Payer: Medicare Other | Admitting: Vascular Surgery

## 2016-11-19 ENCOUNTER — Emergency Department
Admission: EM | Admit: 2016-11-19 | Discharge: 2016-11-19 | Disposition: A | Discharge status: Home / Self Care | Payer: Medicare Other | Attending: Emergency Medicine | Admitting: Emergency Medicine

## 2016-11-19 ENCOUNTER — Encounter: Payer: Self-pay | Admitting: Emergency Medicine

## 2016-11-19 DIAGNOSIS — I959 Hypotension, unspecified: Secondary | ICD-10-CM | POA: Diagnosis present

## 2016-11-19 DIAGNOSIS — E1122 Type 2 diabetes mellitus with diabetic chronic kidney disease: Secondary | ICD-10-CM | POA: Diagnosis not present

## 2016-11-19 DIAGNOSIS — N186 End stage renal disease: Secondary | ICD-10-CM | POA: Insufficient documentation

## 2016-11-19 DIAGNOSIS — F17210 Nicotine dependence, cigarettes, uncomplicated: Secondary | ICD-10-CM | POA: Insufficient documentation

## 2016-11-19 DIAGNOSIS — Z992 Dependence on renal dialysis: Secondary | ICD-10-CM | POA: Insufficient documentation

## 2016-11-19 DIAGNOSIS — J45909 Unspecified asthma, uncomplicated: Secondary | ICD-10-CM | POA: Insufficient documentation

## 2016-11-19 DIAGNOSIS — I120 Hypertensive chronic kidney disease with stage 5 chronic kidney disease or end stage renal disease: Secondary | ICD-10-CM | POA: Diagnosis not present

## 2016-11-19 DIAGNOSIS — Z79899 Other long term (current) drug therapy: Secondary | ICD-10-CM | POA: Insufficient documentation

## 2016-11-19 DIAGNOSIS — Z7982 Long term (current) use of aspirin: Secondary | ICD-10-CM | POA: Insufficient documentation

## 2016-11-19 DIAGNOSIS — Z91040 Latex allergy status: Secondary | ICD-10-CM | POA: Diagnosis not present

## 2016-11-19 DIAGNOSIS — Z794 Long term (current) use of insulin: Secondary | ICD-10-CM | POA: Diagnosis not present

## 2016-11-19 LAB — BASIC METABOLIC PANEL
Anion gap: 15 (ref 5–15)
BUN: 13 mg/dL (ref 6–20)
CO2: 31 mmol/L (ref 22–32)
Calcium: 8.8 mg/dL — ABNORMAL LOW (ref 8.9–10.3)
Chloride: 91 mmol/L — ABNORMAL LOW (ref 101–111)
Creatinine, Ser: 4.3 mg/dL — ABNORMAL HIGH (ref 0.44–1.00)
GFR calc Af Amer: 15 mL/min — ABNORMAL LOW (ref >60)
GFR calc non Af Amer: 13 mL/min — ABNORMAL LOW (ref >60)
GLUCOSE: 120 mg/dL — AB (ref 65–99)
POTASSIUM: 3.5 mmol/L (ref 3.5–5.1)
Sodium: 137 mmol/L (ref 135–145)

## 2016-11-19 LAB — CBC
HCT: 37.3 % (ref 35.0–47.0)
Hemoglobin: 12.5 g/dL (ref 12.0–16.0)
MCH: 29.6 pg (ref 26.0–34.0)
MCHC: 33.4 g/dL (ref 32.0–36.0)
MCV: 88.6 fL (ref 80.0–100.0)
PLATELETS: 186 10*3/uL (ref 150–440)
RBC: 4.21 MIL/uL (ref 3.80–5.20)
RDW: 14 % (ref 11.5–14.5)
WBC: 7.8 10*3/uL (ref 3.6–11.0)

## 2016-11-19 NOTE — ED Triage Notes (Signed)
Patient to ER for c/o hypotension while at dialysis. Patient did not receive full treatment d/t BP being low (97-91'R systolic) per dialysis nurse. Patient states this happens to her frequently on dialysis days and she "has to go lay down". States she did have ringing in her ears and felt like she was going to pass out at time of hypotension, but feels normal and fine now. Patient ambulatory to stat desk without difficulty.

## 2016-11-19 NOTE — ED Notes (Signed)
Pt states that she was at dialysis this morning and her blood pressure dropped. Pt states that her systolic was the 09'K. Pt states that she was feeling bad during dialysis but now she has no complaints. Pts blood pressure is currently WNL

## 2016-11-19 NOTE — ED Notes (Signed)
Pt given meal tray and grape juice

## 2016-11-19 NOTE — ED Provider Notes (Signed)
[facility] Emergency Department Provider Note ____________________________________________   First MD Initiated Contact with Patient 11/19/16 1155     (approximate)  I have reviewed the triage vital signs and the nursing notes.   HISTORY  Chief Complaint Hypotension    HPI [name] is a 31 y.o. female with historyof ESRD on dialysis presents with hypotension, acute onset during her dialysis, measured to 32G to 40N systolic, and associated with lightheadedness. Patient states that this happens frequently to her during her dialysis, and she normally sleeps in the chair or lays down and it improves. Patient states that she felt dizzy at the time, but now feels better. Per patient she got some fluids at the dialysis center but the blood pressure did not respond so they sent her to the emergency department. Patient denies fever or chills or any other recent illness, except that she is currently being treated for an infection in her back for which she has surgery planned in a few weeks.   Past Medical History:  Diagnosis Date  . Asthma   . Diabetes mellitus   . Dysrhythmia   . 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 Hypertension   . Irregular heart beat   . Morbid obesity ([place])   . Renal insufficiency     Patient Active Problem List   Diagnosis Date Noted  . Essential hypertension, benign 02/05/2016  . Diabetes ([place]) 02/05/2016  . Complication from renal dialysis device 07/07/2014  . End stage renal disease ([place]) 12/29/2010    Past Surgical History:  Procedure Laterality Date  . AV FISTULA PLACEMENT  08/26/10   Left Radiocephalic AVF  . AV FISTULA PLACEMENT  08/26/2015   Procedure: ARTERIOVENOUS (AV) FISTULA CREATION;  Surgeon: Algernon Huxley, MD;  Location: [facility] ORS;  Service: Vascular;;  . DIALYSIS/PERMA CATHETER REMOVAL N/A 03/07/2016   Procedure: Dialysis/Perma Catheter Removal;  Surgeon: Algernon Huxley, MD;  Location: [place] [facility];  Service: Cardiovascular;  Laterality: N/A;  . EYE SURGERY     laser photocoagulation  . EYE SURGERY Right 2008   removal of blood clot  . INTRAUTERINE DEVICE (IUD) INSERTION    . PERIPHERAL VASCULAR CATHETERIZATION N/A 07/07/2014   Procedure: A/V Shuntogram/Fistulagram;  Surgeon: Algernon Huxley, MD;  Location: [place] [facility];  Service: Cardiovascular;  Laterality: N/A;  . PERIPHERAL VASCULAR CATHETERIZATION N/A 07/07/2014   Procedure: A/V Shunt Intervention;  Surgeon: Algernon Huxley, MD;  Location: [place] [facility];  Service: Cardiovascular;  Laterality: N/A;  . PERIPHERAL VASCULAR CATHETERIZATION Right 01/12/2015   Procedure: A/V Shuntogram/Fistulagram;  Surgeon: Algernon Huxley, MD;  Location: [place] [facility];  Service: Cardiovascular;  Laterality: Right;  . PERIPHERAL VASCULAR CATHETERIZATION N/A 01/12/2015   Procedure: A/V Shunt Intervention;  Surgeon: Algernon Huxley, MD;  Location: [place] [facility];  Service: Cardiovascular;  Laterality: N/A;  . PERIPHERAL VASCULAR CATHETERIZATION Right 06/08/2015   Procedure: A/V Shuntogram/Fistulagram;  Surgeon: Algernon Huxley, MD;  Location: [place] [facility];  Service: Cardiovascular;  Laterality: Right;  . PERIPHERAL VASCULAR CATHETERIZATION N/A 06/08/2015   Procedure: A/V Shunt Intervention;  Surgeon: Algernon Huxley, MD;  Location: [place] [facility];  Service: Cardiovascular;  Laterality: N/A;  . PERIPHERAL VASCULAR CATHETERIZATION Right 07/14/2015   Procedure: Thrombectomy;  Surgeon: Katha Cabal, MD;  Location: [place] [facility];  Service: Cardiovascular;  Laterality: Right;  . PERIPHERAL VASCULAR CATHETERIZATION N/A 07/14/2015   Procedure: A/V Shuntogram/Fistulagram;  Surgeon: Katha Cabal, MD;  Location: [place] [facility];  Service: Cardiovascular;  Laterality: N/A;  . PERIPHERAL VASCULAR CATHETERIZATION N/A 07/17/2015   Procedure: Thrombectomy;  Surgeon: Katha Cabal, MD;  Location: [place] [facility];  Service:  Cardiovascular;  Laterality: N/A;  . PERIPHERAL VASCULAR CATHETERIZATION N/A 07/17/2015   Procedure: Dialysis/Perma Catheter Insertion;  Surgeon: Katha Cabal, MD;  Location: [place] [facility];  Service: Cardiovascular;  Laterality: N/A;  . PERIPHERAL VASCULAR CATHETERIZATION N/A 08/05/2015   Procedure: Thrombectomy;  Surgeon: Katha Cabal, MD;  Location: [place] [facility];  Service: Cardiovascular;  Laterality: N/A;  declot of graft    Prior to Admission medications   Medication Sig Start Date End Date Taking? Authorizing Provider  albuterol (PROVENTIL HFA;VENTOLIN HFA) 108 (90 Base) MCG/ACT inhaler Inhale 2 puffs into the lungs 2 (two) times daily as needed for wheezing or shortness of breath.  08/17/15 08/16/16  [provider]  aspirin 81 MG tablet Take 81 mg by mouth daily.      [provider]  calcium acetate, Phos Binder, (PHOSLYRA) 667 MG/5ML SOLN Take 7.5 ml twice day with meals 12/22/15   [provider]  cinacalcet (SENSIPAR) 30 MG tablet Take 30 mg by mouth daily with supper.    [provider]  clindamycin (CLEOCIN) 150 MG capsule Take 3 capsules (450 mg total) by mouth 3 (three) times daily. Patient taking differently: Take 300 mg by mouth as directed. 300mg s 1 hour prior to dental procedures 11/09/15   Schaevitz, Randall An, MD  cyclobenzaprine (FLEXERIL) 10 MG tablet Take 1 tablet (10 mg total) by mouth 3 (three) times daily as needed for muscle spasms. 12/05/14   Cuthriell, Charline Bills, PA-C  darbepoetin (ARANESP, ALB FREE, SURECLICK) 662 HUT/6.5YY SOLN Inject 200 mcg into the skin every 7 (seven) days. Reported on 07/14/2015    [provider]  diazepam (VALIUM) 2 MG tablet Take 1 tablet (2 mg total) by mouth every 8 (eight) hours as needed for muscle spasms. 11/01/14   Johnn Hai, PA-C  docusate sodium (COLACE) 100 MG capsule Take 100 mg by mouth daily as needed for mild constipation.     [provider]    insulin glargine (LANTUS) 100 UNIT/ML injection Inject 6 Units into the skin daily as needed (high blood sugar). If blood sugar is over 140    [provider]  metoprolol succinate (TOPROL-XL) 50 MG 24 hr tablet Take 50 mg by mouth at bedtime. Only takes this if the pharmacy accidentally fills this instead of the Lopressor. Patient looks at the mg and takes 50 mg daily or the 25 mg twice daily Take with or immediately following a meal.    [provider]  nicotine (NICODERM CQ - DOSED IN MG/24 HOURS) 21 mg/24hr patch Place 21 mg onto the skin daily.  02/03/16   [provider]  omeprazole (PRILOSEC) 20 MG capsule Take 20 mg by mouth daily.    [provider]  OVER THE COUNTER MEDICATION Take 5 mLs by mouth daily. nutra burst multivitamin liquid    [provider]  oxyCODONE-acetaminophen (ROXICET) 5-325 MG tablet Take 1-2 tablets by mouth every 6 (six) hours as needed. 11/09/15   Orbie Pyo, MD  pravastatin (PRAVACHOL) 40 MG tablet Take 40 mg by mouth at bedtime.     [provider]  promethazine (PHENERGAN) 12.5 MG tablet Take 12.5 mg by mouth every 6 (six) hours as needed for nausea or  vomiting.    [provider]  traMADol (ULTRAM) 50 MG tablet Take 50 mg by mouth every 6 (six) hours as needed for moderate pain.  09/20/15   [provider]    Allergies Marcillin [ampicillin]; Latex; and Vancomycin  No family history on file.  Social History Social History  Substance Use Topics  . Smoking status: Light Tobacco Smoker    Packs/day: 0.00    Years: 10.00    Types: Cigarettes  . Smokeless tobacco: Never Used  . Alcohol use No    Review of Systems  Constitutional: No fever/chills Eyes: No redness.  ENT: No sore throat. Cardiovascular: Denies chest pain. Respiratory: Denies shortness of breath. Gastrointestinal: No nausea, no vomiting.  No diarrhea.  Genitourinary: Negative for dysuria or frequency (pt  states she still makes small amount of urine).  Musculoskeletal: Negative for back pain. Skin: Negative for rash. Neurological: Negative for headache.   ____________________________________________   PHYSICAL EXAM:  VITAL SIGNS: ED Triage Vitals  Enc Vitals Group     BP 11/19/16 1028 110/69     Pulse Rate 11/19/16 1028 (!) 110     Resp 11/19/16 1028 18     Temp 11/19/16 1028 99.2 F (37.3 C)     Temp Source 11/19/16 1028 Oral     SpO2 11/19/16 1028 97 %     Weight 11/19/16 1044 222 lb 10.6 oz (101 kg)     Height 11/19/16 1044 5\' 4"  (1.626 m)     Head Circumference --      Peak Flow --      Pain Score --      Pain Loc --      Pain Edu? --      Excl. in [place]? --     Constitutional: Alert and oriented. Well appearing and in no acute distress. Eyes: Conjunctivae are normal.  Head: Atraumatic. Nose: No congestion/rhinnorhea. Mouth/Throat: Mucous membranes are moist.   Neck: Normal range of motion.  Cardiovascular: Normal rate, regular rhythm. Grossly normal heart sounds.  Good peripheral circulation. Respiratory: Normal respiratory effort.  No retractions. Lungs CTAB. Gastrointestinal: No distention.  Genitourinary: No CVA tenderness. Musculoskeletal: No lower extremity edema.  Extremities warm and well perfused.  Neurologic:  Normal speech and language. No gross focal neurologic deficits are appreciated.  Skin:  Skin is warm and dry. No rash noted. Psychiatric: Mood and affect are normal. Speech and behavior are normal.  ____________________________________________   LABS (all labs ordered are listed, but only abnormal results are displayed)  Labs Reviewed  BASIC METABOLIC PANEL - Abnormal; Notable for the following:       Result Value   Chloride 91 (*)    Glucose, Bld 120 (*)    Creatinine, Ser 4.30 (*)    Calcium 8.8 (*)    GFR calc non Af Amer 13 (*)    GFR calc Af Amer 15 (*)    All other components within normal limits  CBC    ____________________________________________  EKG   ____________________________________________  RADIOLOGY    ____________________________________________   PROCEDURES  Procedure(s) performed: No    Critical Care performed: No ____________________________________________   INITIAL IMPRESSION / ASSESSMENT AND PLAN / ED COURSE  Pertinent labs & imaging results that were available during my care of the patient were reviewed by me and considered in my medical decision making (see chart for details).  31 year old female with history of end-stage renal disease on dialysis presents with hypotension during the dialysis with associated lightheadedness, now  resolved. Per patient, she received some fluids during dialysis but the blood pressure did not improve. Patient states that she is now feeling better. On exam, patient has normal blood pressure, and is still borderline tachycardic, with also borderline temperature which she states she has had before. Patient 's otherwise well-appearing with no focal findings on exam.  Given the patient states she has had hypotension many times in the past during dialysis, I suspect that this is most likely normal dialysis reaction versus possible hypovolemia after dialysis. Patient has no infectious symptoms or evidence of sepsis. on review of past medical records in [place], patient has history of chronic osteomyelitis of the thoracic spinous plan for surgery early next month, and states she is not currently on antibiotics; there is no evidence of acute worsening of this infection other acute spinal issue.    Plan: PO challenge, observe 1-2 to make sure VS stabilize, and likely d/c home.     ----------------------------------------- 2:02 PM on 11/19/2016 -----------------------------------------  Patient's vital signs improved; when I reassessed her her systolic was 98 and heart rate was in the low 90s. She states she feels well, ate without  difficulty and would like to go home. Patient's family member confirms that this happens to her almost every time she gets dialysis.  Patient's electrolytes are within normal limits, so even though her dialysis was cut short she likely does not need repeat dialysis until her normal scheduled next session.  Return precautions given.    ____________________________________________   FINAL CLINICAL IMPRESSION(S) / ED DIAGNOSES  Final diagnoses:  Hypotension, unspecified hypotension type      NEW MEDICATIONS STARTED DURING THIS VISIT:  New Prescriptions   No medications on file     Note:  This document was prepared using Dragon voice recognition software and may include unintentional dictation errors.     Arta Silence, MD 11/19/16 [phone]

## 2016-11-19 NOTE — ED Notes (Signed)
Discussed with Dr. Reita Cliche pt's presentation verbal order for CBC and BMP

## 2016-11-19 NOTE — Discharge Instructions (Signed)
Return to the ER for persistent or recurrent dizziness or lightheadedness, persistently low blood pressure readings, difficulty breathing, swelling in your body, palpitations, chest pain, fevers or chills, worsening back pain, or any other new or worsening symptoms that concern you. Follow up with you regular doctors as scheduled and go to your normal dialysis on Tuesday.

## 2016-12-02 ENCOUNTER — Encounter (INDEPENDENT_AMBULATORY_CARE_PROVIDER_SITE_OTHER): Payer: Medicare Other

## 2016-12-02 ENCOUNTER — Ambulatory Visit (INDEPENDENT_AMBULATORY_CARE_PROVIDER_SITE_OTHER): Payer: Medicare Other | Admitting: Vascular Surgery

## 2016-12-05 DIAGNOSIS — M4624 Osteomyelitis of vertebra, thoracic region: Secondary | ICD-10-CM | POA: Insufficient documentation

## 2016-12-06 ENCOUNTER — Encounter (INDEPENDENT_AMBULATORY_CARE_PROVIDER_SITE_OTHER): Payer: Self-pay

## 2016-12-06 ENCOUNTER — Other Ambulatory Visit (INDEPENDENT_AMBULATORY_CARE_PROVIDER_SITE_OTHER): Payer: Self-pay | Admitting: Vascular Surgery

## 2016-12-07 ENCOUNTER — Ambulatory Visit: Admission: RE | Admit: 2016-12-07 | Payer: Medicare Other | Source: Ambulatory Visit | Admitting: Vascular Surgery

## 2016-12-07 ENCOUNTER — Encounter: Admission: RE | Payer: Self-pay | Source: Ambulatory Visit

## 2016-12-07 SURGERY — A/V FISTULAGRAM
Anesthesia: General

## 2016-12-26 ENCOUNTER — Ambulatory Visit: Payer: Medicare Other

## 2016-12-29 ENCOUNTER — Ambulatory Visit: Payer: Medicare Other | Admitting: Physical Therapy

## 2017-01-03 ENCOUNTER — Encounter: Payer: Medicare Other | Admitting: Physical Therapy

## 2017-01-05 ENCOUNTER — Encounter: Payer: Medicare Other | Admitting: Physical Therapy

## 2017-01-09 ENCOUNTER — Ambulatory Visit (INDEPENDENT_AMBULATORY_CARE_PROVIDER_SITE_OTHER): Payer: Medicare Other | Admitting: Vascular Surgery

## 2017-01-09 ENCOUNTER — Encounter (INDEPENDENT_AMBULATORY_CARE_PROVIDER_SITE_OTHER): Payer: Medicare Other

## 2017-01-10 ENCOUNTER — Encounter: Payer: Medicare Other | Admitting: Physical Therapy

## 2017-01-12 ENCOUNTER — Encounter: Payer: Medicare Other | Admitting: Physical Therapy

## 2017-01-17 ENCOUNTER — Encounter: Payer: Medicare Other | Admitting: Physical Therapy

## 2017-01-26 ENCOUNTER — Encounter: Payer: Medicare Other | Admitting: Physical Therapy

## 2017-02-08 ENCOUNTER — Ambulatory Visit (INDEPENDENT_AMBULATORY_CARE_PROVIDER_SITE_OTHER): Payer: Medicare Other

## 2017-02-08 ENCOUNTER — Encounter (INDEPENDENT_AMBULATORY_CARE_PROVIDER_SITE_OTHER): Payer: Self-pay | Admitting: Vascular Surgery

## 2017-02-08 ENCOUNTER — Ambulatory Visit (INDEPENDENT_AMBULATORY_CARE_PROVIDER_SITE_OTHER): Payer: Medicare Other | Admitting: Vascular Surgery

## 2017-02-08 VITALS — BP 128/85 | HR 93 | Resp 17 | Wt 225.8 lb

## 2017-02-08 DIAGNOSIS — E1122 Type 2 diabetes mellitus with diabetic chronic kidney disease: Secondary | ICD-10-CM | POA: Diagnosis not present

## 2017-02-08 DIAGNOSIS — N186 End stage renal disease: Secondary | ICD-10-CM

## 2017-02-08 DIAGNOSIS — I10 Essential (primary) hypertension: Secondary | ICD-10-CM | POA: Diagnosis not present

## 2017-02-08 DIAGNOSIS — Z992 Dependence on renal dialysis: Secondary | ICD-10-CM | POA: Diagnosis not present

## 2017-02-08 DIAGNOSIS — T829XXA Unspecified complication of cardiac and vascular prosthetic device, implant and graft, initial encounter: Secondary | ICD-10-CM

## 2017-02-10 ENCOUNTER — Encounter (INDEPENDENT_AMBULATORY_CARE_PROVIDER_SITE_OTHER): Payer: Self-pay | Admitting: Vascular Surgery

## 2017-02-10 NOTE — Progress Notes (Signed)
Subjective:    Patient ID: [name], female    DOB: [birth year], 32 y.o.   MRN: [id number] Chief Complaint  Patient presents with  . Follow-up    53mo HDA   Patient presents for a six-month hemodialysis access duplex exam.  The patient presents already knowing that her access has thrombosed.  She is currently being maintained by a right IJ PermCath.  The patient underwent spine surgery last month and states her access had "clotted off the next day".  It sounds that she has seen a vascular surgeon at [facility] that wants to "move her veins" to create a new access.  The patient states the vascular surgeon informed her that if he feels her veins are too small during surgery he would place a graft.  The patient is supposed to have surgery with the vascular surgeon from [facility] on February 15, 2017.  The patient presents to [place] today for a second opinion.  The patient underwent a left upper extremity hemodialysis access duplex which was notable for a thrombosed brachiocephalic AV fistula.  The patient denies any left upper extremity pain or ulceration.  Patient denies any fever, nausea or vomiting.   Review of Systems  Constitutional: Negative.   HENT: Negative.   Eyes: Negative.   Respiratory: Negative.   Cardiovascular: Negative.   Gastrointestinal: Negative.   Endocrine: Negative.   Genitourinary: Negative.   Musculoskeletal: Negative.   Skin: Negative.   Allergic/Immunologic: Negative.   Neurological: Negative.   Hematological: Negative.   Psychiatric/Behavioral: Negative.       Objective:   Physical Exam  Constitutional: She is oriented to person, place, and time. She appears well-developed and well-nourished. No distress.  HENT:  Head: Normocephalic and atraumatic.  Eyes: Conjunctivae are normal. Pupils are equal, round, and reactive to light.  Neck: Normal range of motion.  Cardiovascular: Normal rate, regular rhythm, normal heart sounds and intact distal pulses.    Pulses:      Radial pulses are 2+ on the right side, and 2+ on the left side.       Femoral pulses are 2+ on the right side. Left upper extremity brachiocephalic AV fistula site: No bruit or thrill.  Skin is intact. Right IJ PermCath: Intact.  No signs of infection.  Pulmonary/Chest: Effort normal and breath sounds normal.  Musculoskeletal: Normal range of motion. She exhibits no edema.  Neurological: She is alert and oriented to person, place, and time.  Skin: Skin is warm and dry. She is not diaphoretic.  Psychiatric: She has a normal mood and affect. Her behavior is normal. Judgment and thought content normal.  Vitals reviewed.  BP 128/85 (BP Location: Right Arm)   Pulse 93   Resp 17   Wt 225 lb 12.8 oz (102.4 kg)   BMI 38.76 kg/m   Past Medical History:  Diagnosis Date  . Asthma   . Diabetes mellitus   . Dysrhythmia   . 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 Hypertension   . Irregular heart beat   . Morbid obesity ([place])   . Renal insufficiency    Social History   Socioeconomic History  . Marital status: Single    Spouse name: Not on file  . Number of children: Not on file  . Years of education: Not on file  . Highest education level: Not on file  Social Needs  . Financial resource strain: Not on file  . Food insecurity - worry: Not on  file  . Food insecurity - inability: Not on file  . Transportation needs - medical: Not on file  . Transportation needs - non-medical: Not on file  Occupational History  . Not on file  Tobacco Use  . Smoking status: Light Tobacco Smoker    Packs/day: 0.00    Years: 10.00    Pack years: 0.00    Types: Cigarettes  . Smokeless tobacco: Never Used  Substance and Sexual Activity  . Alcohol use: No  . Drug use: No  . Sexual activity: Yes    Birth control/protection: IUD  Other Topics Concern  . Not on file  Social History Narrative  . Not on file   Past Surgical History:  Procedure Laterality Date  . AV FISTULA  PLACEMENT  08/26/10   Left Radiocephalic AVF  . AV FISTULA PLACEMENT  08/26/2015   Procedure: ARTERIOVENOUS (AV) FISTULA CREATION;  Surgeon: Algernon Huxley, MD;  Location: [facility] ORS;  Service: Vascular;;  . DIALYSIS/PERMA CATHETER REMOVAL N/A 03/07/2016   Procedure: Dialysis/Perma Catheter Removal;  Surgeon: Algernon Huxley, MD;  Location: [place] [facility];  Service: Cardiovascular;  Laterality: N/A;  . EYE SURGERY     laser photocoagulation  . EYE SURGERY Right 2008   removal of blood clot  . INTRAUTERINE DEVICE (IUD) INSERTION    . PERIPHERAL VASCULAR CATHETERIZATION N/A 07/07/2014   Procedure: A/V Shuntogram/Fistulagram;  Surgeon: Algernon Huxley, MD;  Location: [place] [facility];  Service: Cardiovascular;  Laterality: N/A;  . PERIPHERAL VASCULAR CATHETERIZATION N/A 07/07/2014   Procedure: A/V Shunt Intervention;  Surgeon: Algernon Huxley, MD;  Location: [place] [facility];  Service: Cardiovascular;  Laterality: N/A;  . PERIPHERAL VASCULAR CATHETERIZATION Right 01/12/2015   Procedure: A/V Shuntogram/Fistulagram;  Surgeon: Algernon Huxley, MD;  Location: [place] [facility];  Service: Cardiovascular;  Laterality: Right;  . PERIPHERAL VASCULAR CATHETERIZATION N/A 01/12/2015   Procedure: A/V Shunt Intervention;  Surgeon: Algernon Huxley, MD;  Location: [place] [facility];  Service: Cardiovascular;  Laterality: N/A;  . PERIPHERAL VASCULAR CATHETERIZATION Right 06/08/2015   Procedure: A/V Shuntogram/Fistulagram;  Surgeon: Algernon Huxley, MD;  Location: [place] [facility];  Service: Cardiovascular;  Laterality: Right;  . PERIPHERAL VASCULAR CATHETERIZATION N/A 06/08/2015   Procedure: A/V Shunt Intervention;  Surgeon: Algernon Huxley, MD;  Location: [place] [facility];  Service: Cardiovascular;  Laterality: N/A;  . PERIPHERAL VASCULAR CATHETERIZATION Right 07/14/2015   Procedure: Thrombectomy;  Surgeon: Katha Cabal, MD;  Location: [place] [facility];  Service: Cardiovascular;  Laterality: Right;  . PERIPHERAL  VASCULAR CATHETERIZATION N/A 07/14/2015   Procedure: A/V Shuntogram/Fistulagram;  Surgeon: Katha Cabal, MD;  Location: [place] [facility];  Service: Cardiovascular;  Laterality: N/A;  . PERIPHERAL VASCULAR CATHETERIZATION N/A 07/17/2015   Procedure: Thrombectomy;  Surgeon: Katha Cabal, MD;  Location: [place] [facility];  Service: Cardiovascular;  Laterality: N/A;  . PERIPHERAL VASCULAR CATHETERIZATION N/A 07/17/2015   Procedure: Dialysis/Perma Catheter Insertion;  Surgeon: Katha Cabal, MD;  Location: [place] [facility];  Service: Cardiovascular;  Laterality: N/A;  . PERIPHERAL VASCULAR CATHETERIZATION N/A 08/05/2015   Procedure: Thrombectomy;  Surgeon: Katha Cabal, MD;  Location: [place] [facility];  Service: Cardiovascular;  Laterality: N/A;  declot of graft   Family History  Problem Relation Age of Onset  . Diabetes Mother   . Heart attack Mother   . Diabetes Father    Allergies  Allergen Reactions  . Marcillin [Ampicillin] Rash  and Other (See Comments)    Breathing and heart palp  Hot and sweating Has patient had a PCN reaction causing immediate rash, facial/tongue/throat swelling, SOB or lightheadedness with hypotension: Yes Has patient had a PCN reaction causing severe rash involving mucus membranes or skin necrosis: No Has patient had a PCN reaction that required hospitalization Unknown Has patient had a PCN reaction occurring within the last 10 years: Yes If all of the above answers are "NO", then may proceed with Cephalosporin use.   . Latex Swelling and Rash  . Vancomycin Itching, Nausea Only and Rash    Thick pieces of skin pulls off.      Assessment & Plan:  Patient presents for a six-month hemodialysis access duplex exam.  The patient presents already knowing that her access has thrombosed.  She is currently being maintained by a right IJ PermCath.  The patient underwent spine surgery last month and states her access had "clotted off the next  day".  It sounds that she has seen a vascular surgeon at [facility] that wants to "move her veins" to create a new access.  The patient states the vascular surgeon informed her that if he feels her veins are too small during surgery he would place a graft.  The patient is supposed to have surgery with the vascular surgeon from [facility] on February 15, 2017.  The patient presents to [place] today for a second opinion.  The patient underwent a left upper extremity hemodialysis access duplex which was notable for a thrombosed brachiocephalic AV fistula.  The patient denies any left upper extremity pain or ulceration.  Patient denies any fever, nausea or vomiting.  1. End stage renal disease ([place]) - Stable At this time, the patient is being maintained by a right IJ PermCath The patient's left upper extremity brachiocephalic AV fistula is thrombosed The patient is to undergo new dialysis access creation with a vascular surgeon from [facility] on February 15, 2017.   The patient would prefer to have surgery with our practice ([place]). It is our recommendation to place a left brachial axillary graft The patient will move forward with our practice if we can place her on the operating room schedule before February 15, 2017.  If our office is unable to accommodate her then she will continue to have surgery with the vascular surgeon from [facility] Procedure, risks and benefits explained to the patient All questions answered The patient is to continue to dialyze through her right IJ PermCath  2. Complication from renal dialysis device, initial encounter - Stable As above  3. Type 2 diabetes mellitus with chronic kidney disease on chronic dialysis, unspecified whether long term insulin use (HCC) - Stable Encouraged good control as its slows the progression of atherosclerotic disease  4. Essential hypertension, benign - Stable Encouraged good control as its slows the progression of atherosclerotic  disease  Current Outpatient Medications on File Prior to Visit  Medication Sig Dispense Refill  . aspirin 81 MG tablet Take 81 mg by mouth daily.      . calcium acetate, Phos Binder, (PHOSLYRA) 667 MG/5ML SOLN Take 7.5 ml twice day with meals    . cinacalcet (SENSIPAR) 30 MG tablet Take 30 mg by mouth daily with supper.    . clindamycin (CLEOCIN) 150 MG capsule Take 3 capsules (450 mg total) by mouth 3 (three) times daily. (Patient taking differently: Take 300 mg by mouth as directed. 300mg s 1 hour prior to dental procedures) 90 capsule 0  .  cyclobenzaprine (FLEXERIL) 10 MG tablet Take 1 tablet (10 mg total) by mouth 3 (three) times daily as needed for muscle spasms. 15 tablet 0  . darbepoetin (ARANESP, ALB FREE, SURECLICK) 833 ASN/0.5LZ SOLN Inject 200 mcg into the skin every 7 (seven) days. Reported on 07/14/2015    . diazepam (VALIUM) 2 MG tablet Take 1 tablet (2 mg total) by mouth every 8 (eight) hours as needed for muscle spasms. 9 tablet 0  . docusate sodium (COLACE) 100 MG capsule Take 100 mg by mouth daily as needed for mild constipation.     . insulin glargine (LANTUS) 100 UNIT/ML injection Inject 6 Units into the skin daily as needed (high blood sugar). If blood sugar is over 140    . metoprolol succinate (TOPROL-XL) 50 MG 24 hr tablet Take 50 mg by mouth at bedtime. Only takes this if the pharmacy accidentally fills this instead of the Lopressor. Patient looks at the mg and takes 50 mg daily or the 25 mg twice daily Take with or immediately following a meal.    . nicotine (NICODERM CQ - DOSED IN MG/24 HOURS) 21 mg/24hr patch Place 21 mg onto the skin daily.     [name] Kitchen omeprazole (PRILOSEC) 20 MG capsule Take 20 mg by mouth daily.    [name] Kitchen OVER THE COUNTER MEDICATION Take 5 mLs by mouth daily. nutra burst multivitamin liquid    . oxyCODONE-acetaminophen (ROXICET) 5-325 MG tablet Take 1-2 tablets by mouth every 6 (six) hours as needed. 10 tablet 0  . pravastatin (PRAVACHOL) 40 MG tablet Take 40 mg  by mouth at bedtime.     . promethazine (PHENERGAN) 12.5 MG tablet Take 12.5 mg by mouth every 6 (six) hours as needed for nausea or vomiting.    . traMADol (ULTRAM) 50 MG tablet Take 50 mg by mouth every 6 (six) hours as needed for moderate pain.     [name] Kitchen albuterol (PROVENTIL HFA;VENTOLIN HFA) 108 (90 Base) MCG/ACT inhaler Inhale 2 puffs into the lungs 2 (two) times daily as needed for wheezing or shortness of breath.      No current facility-administered medications on file prior to visit.    There are no Patient Instructions on file for this visit. No Follow-up on file.  Reggie Welge A Adain Geurin, PA-C

## 2017-04-20 ENCOUNTER — Encounter (INDEPENDENT_AMBULATORY_CARE_PROVIDER_SITE_OTHER): Payer: Self-pay

## 2017-04-21 ENCOUNTER — Encounter (INDEPENDENT_AMBULATORY_CARE_PROVIDER_SITE_OTHER): Payer: Self-pay

## 2017-04-21 ENCOUNTER — Other Ambulatory Visit (INDEPENDENT_AMBULATORY_CARE_PROVIDER_SITE_OTHER): Payer: Self-pay | Admitting: Vascular Surgery

## 2017-04-27 ENCOUNTER — Other Ambulatory Visit (INDEPENDENT_AMBULATORY_CARE_PROVIDER_SITE_OTHER): Payer: Self-pay | Admitting: Vascular Surgery

## 2017-04-30 MED ORDER — CLINDAMYCIN PHOSPHATE 300 MG/50ML IV SOLN: 300.0000 mg | Freq: Once | INTRAVENOUS | Status: DC

## 2017-05-01 ENCOUNTER — Encounter: Payer: Self-pay | Admitting: Anesthesiology

## 2017-05-01 ENCOUNTER — Encounter
Admission: RE | Disposition: A | Discharge status: Home / Self Care | Payer: Self-pay | Source: Ambulatory Visit | Attending: Internal Medicine

## 2017-05-01 ENCOUNTER — Other Ambulatory Visit (INDEPENDENT_AMBULATORY_CARE_PROVIDER_SITE_OTHER): Payer: Self-pay | Admitting: Vascular Surgery

## 2017-05-01 ENCOUNTER — Inpatient Hospital Stay
Admission: RE | Admit: 2017-05-01 | Discharge: 2017-05-02 | DRG: 640 | Disposition: A | Discharge status: Home / Self Care | Payer: Medicare Other | Source: Ambulatory Visit | Attending: Internal Medicine | Admitting: Internal Medicine

## 2017-05-01 ENCOUNTER — Other Ambulatory Visit: Payer: Self-pay

## 2017-05-01 ENCOUNTER — Encounter: Payer: Self-pay | Admitting: *Deleted

## 2017-05-01 DIAGNOSIS — F17210 Nicotine dependence, cigarettes, uncomplicated: Secondary | ICD-10-CM | POA: Diagnosis present

## 2017-05-01 DIAGNOSIS — Z8349 Family history of other endocrine, nutritional and metabolic diseases: Secondary | ICD-10-CM

## 2017-05-01 DIAGNOSIS — Z6841 Body Mass Index (BMI) 40.0 and over, adult: Secondary | ICD-10-CM

## 2017-05-01 DIAGNOSIS — Y712 Prosthetic and other implants, materials and accessory cardiovascular devices associated with adverse incidents: Secondary | ICD-10-CM

## 2017-05-01 DIAGNOSIS — E785 Hyperlipidemia, unspecified: Secondary | ICD-10-CM | POA: Diagnosis present

## 2017-05-01 DIAGNOSIS — Z91040 Latex allergy status: Secondary | ICD-10-CM

## 2017-05-01 DIAGNOSIS — J45909 Unspecified asthma, uncomplicated: Secondary | ICD-10-CM | POA: Diagnosis present

## 2017-05-01 DIAGNOSIS — N2581 Secondary hyperparathyroidism of renal origin: Secondary | ICD-10-CM | POA: Diagnosis present

## 2017-05-01 DIAGNOSIS — Z794 Long term (current) use of insulin: Secondary | ICD-10-CM

## 2017-05-01 DIAGNOSIS — Z992 Dependence on renal dialysis: Secondary | ICD-10-CM

## 2017-05-01 DIAGNOSIS — N186 End stage renal disease: Secondary | ICD-10-CM

## 2017-05-01 DIAGNOSIS — E1122 Type 2 diabetes mellitus with diabetic chronic kidney disease: Secondary | ICD-10-CM | POA: Diagnosis present

## 2017-05-01 DIAGNOSIS — Z7982 Long term (current) use of aspirin: Secondary | ICD-10-CM

## 2017-05-01 DIAGNOSIS — Z881 Allergy status to other antibiotic agents status: Secondary | ICD-10-CM | POA: Diagnosis not present

## 2017-05-01 DIAGNOSIS — Z5309 Procedure and treatment not carried out because of other contraindication: Secondary | ICD-10-CM | POA: Diagnosis present

## 2017-05-01 DIAGNOSIS — T8241XA Breakdown (mechanical) of vascular dialysis catheter, initial encounter: Secondary | ICD-10-CM | POA: Diagnosis present

## 2017-05-01 DIAGNOSIS — D631 Anemia in chronic kidney disease: Secondary | ICD-10-CM | POA: Diagnosis present

## 2017-05-01 DIAGNOSIS — Z880 Allergy status to penicillin: Secondary | ICD-10-CM | POA: Diagnosis not present

## 2017-05-01 DIAGNOSIS — I120 Hypertensive chronic kidney disease with stage 5 chronic kidney disease or end stage renal disease: Secondary | ICD-10-CM | POA: Diagnosis present

## 2017-05-01 DIAGNOSIS — G894 Chronic pain syndrome: Secondary | ICD-10-CM | POA: Diagnosis present

## 2017-05-01 DIAGNOSIS — Z8249 Family history of ischemic heart disease and other diseases of the circulatory system: Secondary | ICD-10-CM

## 2017-05-01 DIAGNOSIS — Z9884 Bariatric surgery status: Secondary | ICD-10-CM | POA: Diagnosis not present

## 2017-05-01 DIAGNOSIS — E875 Hyperkalemia: Secondary | ICD-10-CM | POA: Diagnosis present

## 2017-05-01 DIAGNOSIS — Z975 Presence of (intrauterine) contraceptive device: Secondary | ICD-10-CM

## 2017-05-01 DIAGNOSIS — Z833 Family history of diabetes mellitus: Secondary | ICD-10-CM | POA: Diagnosis not present

## 2017-05-01 LAB — MRSA PCR SCREENING: MRSA by PCR: NEGATIVE

## 2017-05-01 LAB — CREATININE, SERUM
CREATININE: 8.52 mg/dL — AB (ref 0.44–1.00)
GFR calc Af Amer: 6 mL/min — ABNORMAL LOW (ref >60)
GFR, EST NON AFRICAN AMERICAN: 5 mL/min — AB (ref >60)

## 2017-05-01 LAB — CBC
HCT: 35.4 % (ref 35.0–47.0)
HEMOGLOBIN: 11.8 g/dL — AB (ref 12.0–16.0)
MCH: 29.4 pg (ref 26.0–34.0)
MCHC: 33.3 g/dL (ref 32.0–36.0)
MCV: 88.3 fL (ref 80.0–100.0)
PLATELETS: 186 10*3/uL (ref 150–440)
RBC: 4.01 MIL/uL (ref 3.80–5.20)
RDW: 18.5 % — ABNORMAL HIGH (ref 11.5–14.5)
WBC: 7.2 10*3/uL (ref 3.6–11.0)

## 2017-05-01 LAB — RENAL FUNCTION PANEL
ALBUMIN: 3.6 g/dL (ref 3.5–5.0)
ANION GAP: 18 — AB (ref 5–15)
BUN: 86 mg/dL — ABNORMAL HIGH (ref 6–20)
CALCIUM: 7.2 mg/dL — AB (ref 8.9–10.3)
CO2: 22 mmol/L (ref 22–32)
CREATININE: 13.53 mg/dL — AB (ref 0.44–1.00)
Chloride: 99 mmol/L — ABNORMAL LOW (ref 101–111)
GFR calc non Af Amer: 3 mL/min — ABNORMAL LOW (ref >60)
GFR, EST AFRICAN AMERICAN: 4 mL/min — AB (ref >60)
GLUCOSE: 130 mg/dL — AB (ref 65–99)
PHOSPHORUS: 13.1 mg/dL — AB (ref 2.5–4.6)
Potassium: 7 mmol/L (ref 3.5–5.1)
SODIUM: 139 mmol/L (ref 135–145)

## 2017-05-01 LAB — GLUCOSE, CAPILLARY
Glucose-Capillary: 69 mg/dL (ref 65–99)
Glucose-Capillary: 92 mg/dL (ref 65–99)
Glucose-Capillary: 140 mg/dL — ABNORMAL HIGH (ref 65–99)

## 2017-05-01 LAB — POTASSIUM (ARMC VASCULAR LAB ONLY): Potassium (ARMC vascular lab): 6.5 (ref 3.5–5.1)

## 2017-05-01 LAB — POTASSIUM: Potassium: 7.5 mmol/L (ref 3.5–5.1)

## 2017-05-01 SURGERY — PERIPHERAL VASCULAR THROMBECTOMY
Anesthesia: General | Laterality: Left

## 2017-05-01 MED ORDER — CLINDAMYCIN PHOSPHATE 300 MG/50ML IV SOLN
INTRAVENOUS | Status: AC
  Filled 2017-05-01: qty 50

## 2017-05-01 MED ORDER — ASPIRIN EC 81 MG PO TBEC
81.0000 mg | DELAYED_RELEASE_TABLET | Freq: Every day | ORAL | Status: DC
  Administered 2017-05-02: 81 mg via ORAL
  Filled 2017-05-01: qty 1

## 2017-05-01 MED ORDER — METHYLPREDNISOLONE SODIUM SUCC 125 MG IJ SOLR: 125.0000 mg | INTRAMUSCULAR | Status: DC | PRN

## 2017-05-01 MED ORDER — DOCUSATE SODIUM 100 MG PO CAPS: 100.0000 mg | ORAL_CAPSULE | Freq: Every day | ORAL | Status: DC | PRN

## 2017-05-01 MED ORDER — FENTANYL 12 MCG/HR TD PT72
12.5000 ug | MEDICATED_PATCH | TRANSDERMAL | Status: DC
Start: 2017-05-04 — End: 2017-05-02

## 2017-05-01 MED ORDER — ONDANSETRON HCL 4 MG/2ML IJ SOLN: 4.0000 mg | Freq: Four times a day (QID) | INTRAMUSCULAR | Status: DC | PRN

## 2017-05-01 MED ORDER — PRAVASTATIN SODIUM 20 MG PO TABS: 40.0000 mg | ORAL_TABLET | Freq: Every day | ORAL | Status: DC

## 2017-05-01 MED ORDER — LIDOCAINE HCL (PF) 2 % IJ SOLN
INTRAMUSCULAR | Status: AC
  Filled 2017-05-01: qty 10

## 2017-05-01 MED ORDER — PROMETHAZINE HCL 25 MG PO TABS: 12.5000 mg | ORAL_TABLET | Freq: Four times a day (QID) | ORAL | Status: DC | PRN

## 2017-05-01 MED ORDER — INSULIN ASPART 100 UNIT/ML ~~LOC~~ SOLN
0.0000 [IU] | Freq: Three times a day (TID) | SUBCUTANEOUS | Status: DC
Start: 2017-05-01 — End: 2017-05-01

## 2017-05-01 MED ORDER — PROPOFOL 10 MG/ML IV BOLUS
INTRAVENOUS | Status: AC
  Filled 2017-05-01: qty 20

## 2017-05-01 MED ORDER — DEXTROSE 50 % IV SOLN
12.5000 g | Freq: Once | INTRAVENOUS | Status: AC
  Administered 2017-05-01: 12.5 g via INTRAVENOUS

## 2017-05-01 MED ORDER — SEVELAMER CARBONATE 0.8 G PO PACK
0.8000 g | PACK | Freq: Three times a day (TID) | ORAL | Status: DC
Start: 2017-05-01 — End: 2017-05-02
  Administered 2017-05-02 (×2): 0.8 g via ORAL
  Filled 2017-05-01 (×4): qty 1

## 2017-05-01 MED ORDER — PANTOPRAZOLE SODIUM 40 MG PO TBEC
40.0000 mg | DELAYED_RELEASE_TABLET | Freq: Every day | ORAL | Status: DC
Start: 2017-05-01 — End: 2017-05-02
  Administered 2017-05-02: 40 mg via ORAL
  Filled 2017-05-01: qty 1

## 2017-05-01 MED ORDER — INSULIN ASPART 100 UNIT/ML ~~LOC~~ SOLN: 0.0000 [IU] | Freq: Three times a day (TID) | SUBCUTANEOUS | Status: DC

## 2017-05-01 MED ORDER — CINACALCET HCL 30 MG PO TABS
30.0000 mg | ORAL_TABLET | ORAL | Status: DC
  Filled 2017-05-01: qty 1

## 2017-05-01 MED ORDER — MENTHOL 3 MG MT LOZG
1.0000 | LOZENGE | OROMUCOSAL | Status: DC | PRN
  Administered 2017-05-02 (×2): 3 mg via ORAL
  Filled 2017-05-01 (×2): qty 9

## 2017-05-01 MED ORDER — DEXTROSE 50 % IV SOLN
INTRAVENOUS | Status: AC
  Filled 2017-05-01: qty 50

## 2017-05-01 MED ORDER — HYDROMORPHONE HCL 1 MG/ML IJ SOLN: 1.0000 mg | Freq: Once | INTRAMUSCULAR | Status: DC | PRN

## 2017-05-01 MED ORDER — SODIUM CHLORIDE 0.9 % IV SOLN
INTRAVENOUS | Status: DC
  Administered 2017-05-01: 14:00:00 via INTRAVENOUS

## 2017-05-01 MED ORDER — HEPARIN SODIUM (PORCINE) 5000 UNIT/ML IJ SOLN
5000.0000 [IU] | Freq: Three times a day (TID) | INTRAMUSCULAR | Status: DC
  Administered 2017-05-01: 5000 [IU] via SUBCUTANEOUS
  Filled 2017-05-01: qty 1

## 2017-05-01 MED ORDER — DOCUSATE SODIUM 100 MG PO CAPS: 100.0000 mg | ORAL_CAPSULE | Freq: Two times a day (BID) | ORAL | Status: DC | PRN

## 2017-05-01 MED ORDER — OXYCODONE-ACETAMINOPHEN 5-325 MG PO TABS: 1.0000 | ORAL_TABLET | Freq: Four times a day (QID) | ORAL | Status: DC | PRN

## 2017-05-01 MED ORDER — FENTANYL CITRATE (PF) 100 MCG/2ML IJ SOLN
INTRAMUSCULAR | Status: AC
  Filled 2017-05-01: qty 2

## 2017-05-01 MED ORDER — FAMOTIDINE 20 MG PO TABS: 40.0000 mg | ORAL_TABLET | ORAL | Status: DC | PRN

## 2017-05-01 NOTE — Progress Notes (Signed)
Pre HD assessment   05/01/17 1750  Vital Signs  Temp 98.4 F (36.9 C)  Temp Source Oral  Pulse Rate 89  Pulse Rate Source Monitor  Resp 18  BP 134/74  BP Location Right Arm  BP Method Automatic  Patient Position (if appropriate) Lying  Oxygen Therapy  SpO2 100 %  O2 Device Room Air  Pain Assessment  Pain Scale 0-10  Pain Score 0  Dialysis Weight  Weight 110.4 kg (243 lb 6.2 oz)  Type of Weight Pre-Dialysis  Time-Out for Hemodialysis  What Procedure? HD  Pt Identifiers(min of two) First/Last Name;MRN/Account#  Correct Site? Yes  Correct Side? Yes  Correct Procedure? Yes  Consents Verified? Yes  Rad Studies Available? N/A  Safety Precautions Reviewed? Yes  Engineer, civil (consulting) Number  (2A)  Station Number 4  UF/Alarm Test Passed  Conductivity: Meter 14  Conductivity: Machine  14  pH 7.6  Reverse Osmosis main  Normal Saline Lot Number [id number]  Dialyzer Lot Number [id number]  Disposable Set Lot Number [id number]  Machine Temperature 98.6 F (37 C)  Musician and Audible Yes  Blood Lines Intact and Secured Yes  Pre Treatment Patient Checks  Vascular access used during treatment Catheter  Hepatitis B Surface Antigen Results Negative  Date Hepatitis B Surface Antigen Drawn 04/20/17  Hepatitis B Surface Antibody 16  Date Hepatitis B Surface Antibody Drawn 04/20/17  Hemodialysis Consent Verified Yes  Hemodialysis Standing Orders Initiated Yes  ECG (Telemetry) Monitor On Yes  Prime Ordered Normal Saline  Length of  DialysisTreatment -hour(s) 3 Hour(s)  Dialyzer Elisio 17H NR  Dialysate 2K, 2.5 Ca  Dialysis Anticoagulant None  Dialysate Flow Ordered 600  Blood Flow Rate Ordered 400 mL/min  Ultrafiltration Goal 2 Liters  Pre Treatment Labs Renal panel;CBC (Post HD Potassium )  Dialysis Blood Pressure Support Ordered Normal Saline  Education / Care Plan  Dialysis Education Provided Yes  Documented Education in Care Plan Yes

## 2017-05-01 NOTE — H&P (Signed)
[facility] NAME: [name]    MR#:  [id number]  DATE OF BIRTH:  September 28, 1985  DATE OF ADMISSION:  05/01/2017  PRIMARY CARE PHYSICIAN: [facility]   REQUESTING/REFERRING PHYSICIAN: dew  CHIEF COMPLAINT:  No chief complaint on file.   HISTORY OF PRESENT ILLNESS: [name]  is a 32 y.o. female with a known history of DM, ESRD on HD, Htn- Her HD days are TTS. Last HD was Thursday- when they noted her Fistula is clotted, so did her HD from permacath. ( She also have permacath). She was out of town, so scheduled her De-clotting procedure today. She did not have HD on Saturday. Noted to have High potassium today, so vascular suggest to admit for urgent HD and no procedure today. Pt have no complains.  PAST MEDICAL HISTORY:   Past Medical History:  Diagnosis Date  . Asthma   . Diabetes mellitus   . Dysrhythmia   . 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 Hypertension   . Irregular heart beat   . Morbid obesity ([place])   . Renal insufficiency     PAST SURGICAL HISTORY:  Past Surgical History:  Procedure Laterality Date  . AV FISTULA PLACEMENT  08/26/10   Left Radiocephalic AVF  . AV FISTULA PLACEMENT  08/26/2015   Procedure: ARTERIOVENOUS (AV) FISTULA CREATION;  Surgeon: Algernon Huxley, MD;  Location: [facility];  Service: Vascular;;  . DIALYSIS/PERMA CATHETER REMOVAL N/A 03/07/2016   Procedure: Dialysis/Perma Catheter Removal;  Surgeon: Algernon Huxley, MD;  Location: [place] [facility];  Service: Cardiovascular;  Laterality: N/A;  . EYE SURGERY     laser photocoagulation  . EYE SURGERY Right 2008   removal of blood clot  . INTRAUTERINE DEVICE (IUD) INSERTION    . PERIPHERAL VASCULAR CATHETERIZATION N/A 07/07/2014   Procedure: A/V Shuntogram/Fistulagram;  Surgeon: Algernon Huxley, MD;  Location: [place] [facility];  Service: Cardiovascular;  Laterality: N/A;  . PERIPHERAL VASCULAR CATHETERIZATION N/A 07/07/2014   Procedure: A/V Shunt Intervention;  Surgeon: Algernon Huxley, MD;  Location: [place] [facility];  Service: Cardiovascular;  Laterality: N/A;  . PERIPHERAL VASCULAR CATHETERIZATION Right 01/12/2015   Procedure: A/V Shuntogram/Fistulagram;  Surgeon: Algernon Huxley, MD;  Location: [place] [facility];  Service: Cardiovascular;  Laterality: Right;  . PERIPHERAL VASCULAR CATHETERIZATION N/A 01/12/2015   Procedure: A/V Shunt Intervention;  Surgeon: Algernon Huxley, MD;  Location: [place] [facility];  Service: Cardiovascular;  Laterality: N/A;  . PERIPHERAL VASCULAR CATHETERIZATION Right 06/08/2015   Procedure: A/V Shuntogram/Fistulagram;  Surgeon: Algernon Huxley, MD;  Location: [place] [facility];  Service: Cardiovascular;  Laterality: Right;  . PERIPHERAL VASCULAR CATHETERIZATION N/A 06/08/2015   Procedure: A/V Shunt Intervention;  Surgeon: Algernon Huxley, MD;  Location: [place] [facility];  Service: Cardiovascular;  Laterality: N/A;  . PERIPHERAL VASCULAR CATHETERIZATION Right 07/14/2015   Procedure: Thrombectomy;  Surgeon: Katha Cabal, MD;  Location: [place] [facility];  Service: Cardiovascular;  Laterality: Right;  . PERIPHERAL VASCULAR CATHETERIZATION N/A 07/14/2015   Procedure: A/V Shuntogram/Fistulagram;  Surgeon: Katha Cabal, MD;  Location: [place] [facility];  Service: Cardiovascular;  Laterality: N/A;  . PERIPHERAL VASCULAR CATHETERIZATION N/A 07/17/2015   Procedure: Thrombectomy;  Surgeon: Katha Cabal, MD;  Location: [place] [facility];  Service: Cardiovascular;  Laterality: N/A;  . PERIPHERAL VASCULAR CATHETERIZATION N/A 07/17/2015   Procedure: Dialysis/Perma Catheter Insertion;  Surgeon:  Katha Cabal, MD;  Location: [place] [facility];  Service: Cardiovascular;  Laterality: N/A;  . PERIPHERAL VASCULAR CATHETERIZATION N/A 08/05/2015   Procedure: Thrombectomy;  Surgeon: Katha Cabal, MD;  Location: [place] [facility];  Service: Cardiovascular;  Laterality: N/A;  declot of  graft    SOCIAL HISTORY:  Social History   Tobacco Use  . Smoking status: Light Tobacco Smoker    Packs/day: 0.00    Years: 10.00    Pack years: 0.00    Types: Cigarettes  . Smokeless tobacco: Never Used  Substance Use Topics  . Alcohol use: No    FAMILY HISTORY:  Family History  Problem Relation Age of Onset  . Diabetes Mother   . Heart attack Mother   . Diabetes Father     DRUG ALLERGIES:  Allergies  Allergen Reactions  . Marcillin [Ampicillin] Rash and Other (See Comments)    Breathing and heart palp  Hot and sweating Has patient had a PCN reaction causing immediate rash, facial/tongue/throat swelling, SOB or lightheadedness with hypotension: Yes Has patient had a PCN reaction causing severe rash involving mucus membranes or skin necrosis: No Has patient had a PCN reaction that required hospitalization Unknown Has patient had a PCN reaction occurring within the last 10 years: Yes If all of the above answers are "NO", then may proceed with Cephalosporin use.   . Latex Swelling and Rash  . Vancomycin Itching, Nausea Only and Rash    Thick pieces of skin pulls off.    REVIEW OF SYSTEMS:   CONSTITUTIONAL: No fever, fatigue or weakness.  EYES: No blurred or double vision.  EARS, NOSE, AND THROAT: No tinnitus or ear pain.  RESPIRATORY: No cough, shortness of breath, wheezing or hemoptysis.  CARDIOVASCULAR: No chest pain, orthopnea, edema.  GASTROINTESTINAL: No nausea, vomiting, diarrhea or abdominal pain.  GENITOURINARY: No dysuria, hematuria.  ENDOCRINE: No polyuria, nocturia,  HEMATOLOGY: No anemia, easy bruising or bleeding SKIN: No rash or lesion. MUSCULOSKELETAL: No joint pain or arthritis.   NEUROLOGIC: No tingling, numbness, weakness.  PSYCHIATRY: No anxiety or depression.   MEDICATIONS AT HOME:  Prior to Admission medications   Medication Sig Start Date End Date Taking? Authorizing Provider  albuterol (PROVENTIL HFA;VENTOLIN HFA) 108 (90 Base) MCG/ACT  inhaler Inhale 2 puffs into the lungs 2 (two) times daily as needed for wheezing or shortness of breath.  08/17/15 04/20/17 Yes [provider]  cinacalcet (SENSIPAR) 30 MG tablet Take 30 mg by mouth See admin instructions. Given at dialysis Tue Thu Sat   Yes [provider]  docusate sodium (COLACE) 100 MG capsule Take 100 mg by mouth daily as needed for mild constipation.    Yes [provider]  fentaNYL (DURAGESIC - DOSED MCG/HR) 12 MCG/HR Place 12.5 mcg onto the skin every 3 (three) days.   Yes [provider]  insulin glargine (LANTUS) 100 UNIT/ML injection Inject 6 Units into the skin daily as needed (high blood sugar). If blood sugar is over 140   Yes [provider]  omeprazole (PRILOSEC) 20 MG capsule Take 20 mg by mouth daily.   Yes [provider]  OVER THE COUNTER MEDICATION Take 5 mLs by mouth daily. nutra burst multivitamin liquid   Yes [provider]  oxyCODONE-acetaminophen (ROXICET) 5-325 MG tablet Take 1-2 tablets by mouth every 6 (six) hours as needed. Patient taking differently: Take 1-2 tablets by mouth 2 (two) times daily as needed for moderate pain.  11/09/15  Yes Larae Grooms  Rodman Key, MD  promethazine (PHENERGAN) 12.5 MG tablet Take 12.5 mg by mouth every 6 (six) hours as needed for nausea or vomiting.   Yes [provider]  sevelamer carbonate (RENVELA) 0.8 g PACK packet Take 1 packet by mouth 3 (three) times daily with meals. 07/06/11  Yes [provider]  aspirin 81 MG tablet Take 81 mg by mouth daily.      [provider]  clindamycin (CLEOCIN) 150 MG capsule Take 3 capsules (450 mg total) by mouth 3 (three) times daily. Patient taking differently: Take 300 mg by mouth as directed. 300mg s 1 hour prior to dental procedures 11/09/15   Schaevitz, Randall An, MD  darbepoetin (ARANESP, ALB FREE, SURECLICK) 093 OIZ/1.2WP SOLN Inject 200 mcg into the skin every 7 (seven) days. Reported on  07/14/2015    [provider]  pravastatin (PRAVACHOL) 40 MG tablet Take 40 mg by mouth at bedtime.     [provider]      PHYSICAL EXAMINATION:   VITAL SIGNS: Blood pressure (!) 151/94, pulse 87, temperature 98 F (36.7 C), temperature source Oral, resp. rate 14, height 5\' 4"  (1.626 m), weight 98.4 kg (217 lb), SpO2 99 %.  GENERAL:  32 y.o.-year-old obese patient lying in the bed with no acute distress.  EYES: Pupils equal, round, reactive to light and accommodation. No scleral icterus. Extraocular muscles intact.  HEENT: Head atraumatic, normocephalic. Oropharynx and nasopharynx clear.  NECK:  Supple, no jugular venous distention. No thyroid enlargement, no tenderness.  LUNGS: Normal breath sounds bilaterally, no wheezing, rales,rhonchi or crepitation. No use of accessory muscles of respiration. Right upper chest Permacath. CARDIOVASCULAR: S1, S2 normal. No murmurs, rubs, or gallops.  ABDOMEN: Soft, nontender, nondistended. Bowel sounds present. No organomegaly or mass.  EXTREMITIES: No pedal edema, cyanosis, or clubbing. Left arm- AV fistula in place. NEUROLOGIC: Cranial nerves II through XII are intact. Muscle strength 5/5 in all extremities. Sensation intact. Gait not checked.  PSYCHIATRIC: The patient is alert and oriented x 3.  SKIN: No obvious rash, lesion, or ulcer. On her back- scar from spinal surgery present.  LABORATORY PANEL:   CBC No results for input(s): WBC, HGB, HCT, PLT, MCV, MCH, MCHC, RDW, LYMPHSABS, MONOABS, EOSABS, BASOSABS, BANDABS in the last 168 hours.  Invalid input(s): NEUTRABS, BANDSABD ------------------------------------------------------------------------------------------------------------------  Chemistries  Recent Labs  Lab 05/01/17 1403  K 7.5*   ------------------------------------------------------------------------------------------------------------------ CrCl cannot be calculated (Patient's most recent lab result is  older than the maximum 21 days allowed.). ------------------------------------------------------------------------------------------------------------------ No results for input(s): TSH, T4TOTAL, T3FREE, THYROIDAB in the last 72 hours.  Invalid input(s): FREET3   Coagulation profile No results for input(s): INR, PROTIME in the last 168 hours. ------------------------------------------------------------------------------------------------------------------- No results for input(s): DDIMER in the last 72 hours. -------------------------------------------------------------------------------------------------------------------  Cardiac Enzymes No results for input(s): CKMB, TROPONINI, MYOGLOBIN in the last 168 hours.  Invalid input(s): CK ------------------------------------------------------------------------------------------------------------------ Invalid input(s): POCBNP  ---------------------------------------------------------------------------------------------------------------  Urinalysis    Component Value Date/Time   COLORURINE YELLOW (A) 05/08/2015 0101   APPEARANCEUR CLOUDY (A) 05/08/2015 0101   LABSPEC 1.010 05/08/2015 0101   PHURINE 9.0 (H) 05/08/2015 0101   GLUCOSEU >500 (A) 05/08/2015 0101   HGBUR NEGATIVE 05/08/2015 0101   BILIRUBINUR NEGATIVE 05/08/2015 0101   KETONESUR NEGATIVE 05/08/2015 0101   PROTEINUR 100 (A) 05/08/2015 0101   UROBILINOGEN 0.2 08/17/2010 1234   NITRITE NEGATIVE 05/08/2015 0101   LEUKOCYTESUR 2+ (A) 05/08/2015 0101     RADIOLOGY: No results found.  EKG: Orders placed or performed during the hospital  encounter of 07/13/15  . ED EKG  . ED EKG  . EKG    IMPRESSION AND PLAN:  * Hyperkalemia   Due to missed HD   Urgent HD today, I spoke to Dr. Juleen China   Monitor on tele.  * ESRD on HD   malfunction of Dialysis access   Vascular consult.   nephro consult.  * Hyperlipidemia   Cont pravastatin  * DM   Keep on sliding scale  coverage.  * Active smoking    Counseled to quit for 4 min, Offered nicotine patch.     All the records are reviewed and case discussed with ED provider. Management plans discussed with the patient, family and they are in agreement.  CODE STATUS: Full. Code Status History    Date Active Date Inactive Code Status Order ID Comments User Context   01/12/2015 0918 01/12/2015 1306 Full Code [id number]  Algernon Huxley, MD Inpatient       TOTAL TIME TAKING CARE OF THIS PATIENT: 45 minutes.  Her boyfriend in room.  Vaughan Basta M.D on 05/01/2017   Between 7am to 6pm - Pager - [phone]  After 6pm go to [email or web address] - password [email or web address] [facility]  Office  [phone]  CC: Primary care physician; [facility]   Note: This dictation was prepared with Dragon dictation along with smaller phrase technology. Any transcriptional errors that result from this process are unintentional.

## 2017-05-01 NOTE — Progress Notes (Signed)
Pre HD assessment    05/01/17 1749  Neurological  Level of Consciousness Alert  Orientation Level Oriented X4  Respiratory  Respiratory Pattern Regular;Unlabored  Chest Assessment Chest expansion symmetrical  Cardiac  Pulse Irregular  ECG Monitor Yes  Cardiac Rhythm NSR;ST  Vascular  R Radial Pulse +2  L Radial Pulse +2  Integumentary  Integumentary (WDL) X  Skin Color Appropriate for ethnicity  Musculoskeletal  Musculoskeletal (WDL) X  Generalized Weakness Yes  Assistive Device None  GU Assessment  Genitourinary (WDL) X  Genitourinary Symptoms  (HD)  Psychosocial  Psychosocial (WDL) WDL

## 2017-05-01 NOTE — Progress Notes (Signed)
This is to notify that- [name] is being admitted to hospital today from procedure recovery room. Her friend Mr Rosanne Ashing DOB- 10/09[birth year]1979 was in the room in hospital during my visit today.  Please call for further details. -Dr.Kyiah Canepa  [phone].

## 2017-05-01 NOTE — Progress Notes (Signed)
Home meds sent down to pharmacy.

## 2017-05-01 NOTE — Progress Notes (Signed)
Post HD assessment    05/01/17 2110  Neurological  Level of Consciousness Alert  Orientation Level Oriented X4  Respiratory  Respiratory Pattern Regular;Unlabored  Chest Assessment Chest expansion symmetrical  Cardiac  Pulse Irregular  ECG Monitor Yes  Cardiac Rhythm NSR;ST  Vascular  R Radial Pulse +2  L Radial Pulse +2  Integumentary  Integumentary (WDL) X  Skin Color Appropriate for ethnicity  Musculoskeletal  Musculoskeletal (WDL) X  Generalized Weakness Yes  Assistive Device None  GU Assessment  Genitourinary (WDL) X  Genitourinary Symptoms  (HD)  Psychosocial  Psychosocial (WDL) WDL

## 2017-05-01 NOTE — Progress Notes (Signed)
Patient was scheduled for attempted thrombectomy of her access.  Her potassium was markedly elevated precluding safe fistulogram and possible thrombectomy.  She will need to be admitted for emergent dialysis through her catheter.  We can attempt thrombectomy later in the week depending on the schedule availability, likely Wednesday or Thursday.

## 2017-05-01 NOTE — Consult Note (Signed)
[facility]  CONSULT NOTE    Date: 05/01/2017                  Patient Name:  [name]  MRN: [id number]  DOB: 05-11-1985  Age / Sex: 32 y.o., female         PCP: [facility] [place] [facility]                 Service Requesting Consult: Dr. Anselm Jungling                 Reason for Consult: End Stage Renal Disease            History of Present Illness: Ms. [name] is a 32 y.o. black female whose last hemodialysis treatment was Tuesday, her AVG started to bleed after treatment. A permcath was then placed and she got dialysis on Thursday. She then missed dialysis Saturday and presented for declot of her AVG by [facility]. However procedure was cancelled due to hyperkalemia.    Medications: Outpatient medications: Medications Prior to Admission  Medication Sig Dispense Refill Last Dose  . albuterol (PROVENTIL HFA;VENTOLIN HFA) 108 (90 Base) MCG/ACT inhaler Inhale 2 puffs into the lungs 2 (two) times daily as needed for wheezing or shortness of breath.    Taking  . cinacalcet (SENSIPAR) 30 MG tablet Take 30 mg by mouth See admin instructions. Given at dialysis Tue Thu Sat   Past Week at Unknown time  . docusate sodium (COLACE) 100 MG capsule Take 100 mg by mouth daily as needed for mild constipation.    Past Month at Unknown time  . fentaNYL (DURAGESIC - DOSED MCG/HR) 12 MCG/HR Place 12.5 mcg onto the skin every 3 (three) days.   05/01/2017 at Unknown time  . insulin glargine (LANTUS) 100 UNIT/ML injection Inject 6 Units into the skin daily as needed (high blood sugar). If blood sugar is over 140   Past Month at Unknown time  . omeprazole (PRILOSEC) 20 MG capsule Take 20 mg by mouth daily.   Past Month at Unknown time  . OVER THE COUNTER MEDICATION Take 5 mLs by mouth daily. nutra burst multivitamin liquid   Taking  . oxyCODONE-acetaminophen (ROXICET) 5-325 MG tablet Take 1-2 tablets by mouth every 6 (six) hours as needed. (Patient taking differently: Take 1-2 tablets  by mouth 2 (two) times daily as needed for moderate pain. ) 10 tablet 0 04/30/2017 at Unknown time  . promethazine (PHENERGAN) 12.5 MG tablet Take 12.5 mg by mouth every 6 (six) hours as needed for nausea or vomiting.   Taking  . sevelamer carbonate (RENVELA) 0.8 g PACK packet Take 1 packet by mouth 3 (three) times daily with meals.   Past Week at Unknown time  . aspirin 81 MG tablet Take 81 mg by mouth daily.     Not Taking at Unknown time  . clindamycin (CLEOCIN) 150 MG capsule Take 3 capsules (450 mg total) by mouth 3 (three) times daily. (Patient taking differently: Take 300 mg by mouth as directed. 300mg s 1 hour prior to dental procedures) 90 capsule 0 Taking  . darbepoetin (ARANESP, ALB FREE, SURECLICK) 409 WJX/9.1YN SOLN Inject 200 mcg into the skin every 7 (seven) days. Reported on 07/14/2015   Taking  . pravastatin (PRAVACHOL) 40 MG tablet Take 40 mg by mouth at bedtime.    Not Taking at Unknown time    Current medications: Current Facility-Administered Medications  Medication Dose Route Frequency Provider Last Rate Last Dose  .  aspirin EC tablet 81 mg  81 mg Oral Daily Vaughan Basta, MD      . Derrill Memo ON 05/02/2017] cinacalcet (SENSIPAR) tablet 30 mg  30 mg Oral Q T,Th,Sa-HD Vaughan Basta, MD      . clindamycin (CLEOCIN) 300 MG/50ML IVPB           . dextrose 50 % solution           . docusate sodium (COLACE) capsule 100 mg  100 mg Oral BID PRN Vaughan Basta, MD      . Derrill Memo ON 05/04/2017] fentaNYL (DURAGESIC - dosed mcg/hr) 12.5 mcg  12.5 mcg Transdermal Q72H Vaughan Basta, MD      . heparin injection 5,000 Units  5,000 Units Subcutaneous Q8H Vaughan Basta, MD      . HYDROmorphone (DILAUDID) injection 1 mg  1 mg Intravenous Once PRN Stegmayer, Kimberly A, PA-C      . insulin aspart (novoLOG) injection 0-9 Units  0-9 Units Subcutaneous TID WC Vaughan Basta, MD      . ondansetron (ZOFRAN) injection 4 mg  4 mg Intravenous Q6H PRN Stegmayer,  Kimberly A, PA-C      . oxyCODONE-acetaminophen (PERCOCET/ROXICET) 5-325 MG per tablet 1 tablet  1 tablet Oral Q6H PRN Vaughan Basta, MD      . pantoprazole (PROTONIX) EC tablet 40 mg  40 mg Oral Daily Vaughan Basta, MD      . pravastatin (PRAVACHOL) tablet 40 mg  40 mg Oral QHS Vaughan Basta, MD      . promethazine (PHENERGAN) tablet 12.5 mg  12.5 mg Oral Q6H PRN Vaughan Basta, MD      . sevelamer carbonate (RENVELA) packet 0.8 g  0.8 g Oral TID WC Vaughan Basta, MD          Allergies: Allergies  Allergen Reactions  . Marcillin [Ampicillin] Rash and Other (See Comments)    Breathing and heart palp  Hot and sweating Has patient had a PCN reaction causing immediate rash, facial/tongue/throat swelling, SOB or lightheadedness with hypotension: Yes Has patient had a PCN reaction causing severe rash involving mucus membranes or skin necrosis: No Has patient had a PCN reaction that required hospitalization Unknown Has patient had a PCN reaction occurring within the last 10 years: Yes If all of the above answers are "NO", then may proceed with Cephalosporin use.   . Latex Swelling and Rash  . Vancomycin Itching, Nausea Only and Rash    Thick pieces of skin pulls off.      Past Medical History: Past Medical History:  Diagnosis Date  . Asthma   . Diabetes mellitus   . Dysrhythmia   . 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 Hypertension   . Irregular heart beat   . Morbid obesity ([place])   . Renal insufficiency      Past Surgical History: Past Surgical History:  Procedure Laterality Date  . AV FISTULA PLACEMENT  08/26/10   Left Radiocephalic AVF  . AV FISTULA PLACEMENT  08/26/2015   Procedure: ARTERIOVENOUS (AV) FISTULA CREATION;  Surgeon: Algernon Huxley, MD;  Location: [facility] ORS;  Service: Vascular;;  . BACK SURGERY    . DIALYSIS/PERMA CATHETER REMOVAL N/A 03/07/2016   Procedure: Dialysis/Perma Catheter Removal;  Surgeon: Algernon Huxley, MD;  Location: [place] [facility];  Service: Cardiovascular;  Laterality: N/A;  . EYE SURGERY     laser photocoagulation  . EYE SURGERY Right 2008   removal of blood clot  . INTRAUTERINE DEVICE (  IUD) INSERTION    . PERIPHERAL VASCULAR CATHETERIZATION N/A 07/07/2014   Procedure: A/V Shuntogram/Fistulagram;  Surgeon: Algernon Huxley, MD;  Location: [place] [facility];  Service: Cardiovascular;  Laterality: N/A;  . PERIPHERAL VASCULAR CATHETERIZATION N/A 07/07/2014   Procedure: A/V Shunt Intervention;  Surgeon: Algernon Huxley, MD;  Location: [place] [facility];  Service: Cardiovascular;  Laterality: N/A;  . PERIPHERAL VASCULAR CATHETERIZATION Right 01/12/2015   Procedure: A/V Shuntogram/Fistulagram;  Surgeon: Algernon Huxley, MD;  Location: [place] [facility];  Service: Cardiovascular;  Laterality: Right;  . PERIPHERAL VASCULAR CATHETERIZATION N/A 01/12/2015   Procedure: A/V Shunt Intervention;  Surgeon: Algernon Huxley, MD;  Location: [place] [facility];  Service: Cardiovascular;  Laterality: N/A;  . PERIPHERAL VASCULAR CATHETERIZATION Right 06/08/2015   Procedure: A/V Shuntogram/Fistulagram;  Surgeon: Algernon Huxley, MD;  Location: [place] [facility];  Service: Cardiovascular;  Laterality: Right;  . PERIPHERAL VASCULAR CATHETERIZATION N/A 06/08/2015   Procedure: A/V Shunt Intervention;  Surgeon: Algernon Huxley, MD;  Location: [place] [facility];  Service: Cardiovascular;  Laterality: N/A;  . PERIPHERAL VASCULAR CATHETERIZATION Right 07/14/2015   Procedure: Thrombectomy;  Surgeon: Katha Cabal, MD;  Location: [place] [facility];  Service: Cardiovascular;  Laterality: Right;  . PERIPHERAL VASCULAR CATHETERIZATION N/A 07/14/2015   Procedure: A/V Shuntogram/Fistulagram;  Surgeon: Katha Cabal, MD;  Location: [place] [facility];  Service: Cardiovascular;  Laterality: N/A;  . PERIPHERAL VASCULAR CATHETERIZATION N/A 07/17/2015   Procedure: Thrombectomy;  Surgeon: Katha Cabal, MD;  Location:  [place] [facility];  Service: Cardiovascular;  Laterality: N/A;  . PERIPHERAL VASCULAR CATHETERIZATION N/A 07/17/2015   Procedure: Dialysis/Perma Catheter Insertion;  Surgeon: Katha Cabal, MD;  Location: [place] [facility];  Service: Cardiovascular;  Laterality: N/A;  . PERIPHERAL VASCULAR CATHETERIZATION N/A 08/05/2015   Procedure: Thrombectomy;  Surgeon: Katha Cabal, MD;  Location: [place] [facility];  Service: Cardiovascular;  Laterality: N/A;  declot of graft     Family History: Family History  Problem Relation Age of Onset  . Diabetes Mother   . Heart attack Mother   . Stroke Mother   . High Cholesterol Mother   . Diabetes Father      Social History: Social History   Socioeconomic History  . Marital status: Single    Spouse name: Not on file  . Number of children: Not on file  . Years of education: Not on file  . Highest education level: Not on file  Occupational History  . Not on file  Social Needs  . Financial resource strain: Not on file  . Food insecurity:    Worry: Not on file    Inability: Not on file  . Transportation needs:    Medical: Not on file    Non-medical: Not on file  Tobacco Use  . Smoking status: Light Tobacco Smoker    Packs/day: 0.25    Years: 10.00    Pack years: 2.50    Types: Cigarettes  . Smokeless tobacco: Never Used  Substance and Sexual Activity  . Alcohol use: No  . Drug use: No  . Sexual activity: Yes    Birth control/protection: IUD  Lifestyle  . Physical activity:    Days per week: Not on file    Minutes per session: Not on file  . Stress: Not on file  Relationships  . Social connections:    Talks on phone: Not on file    Gets together: Not on  file    Attends religious service: Not on file    Active member of club or organization: Not on file    Attends meetings of clubs or organizations: Not on file    Relationship status: Not on file  . Intimate partner violence:    Fear of current or ex partner: Not  on file    Emotionally abused: Not on file    Physically abused: Not on file    Forced sexual activity: Not on file  Other Topics Concern  . Not on file  Social History Narrative  . Not on file     Review of Systems: Review of Systems  Constitutional: Negative.   HENT: Negative.   Eyes: Negative.   Respiratory: Negative.   Cardiovascular: Negative.   Gastrointestinal: Negative.   Genitourinary: Negative.   Musculoskeletal: Negative.   Skin: Negative.   Neurological: Negative.   Endo/Heme/Allergies: Negative.   Psychiatric/Behavioral: Negative.     Vital Signs: Blood pressure 108/68, pulse (!) 116, temperature 98.4 F (36.9 C), temperature source Oral, resp. rate (!) 21, height 5\' 4"  (1.626 m), weight 110.4 kg (243 lb 6.2 oz), SpO2 100 %.  Weight trends: Filed Weights   05/01/17 1323 05/01/17 1750  Weight: 98.4 kg (217 lb) 110.4 kg (243 lb 6.2 oz)    Physical Exam: General: NAD,   Head: Normocephalic, atraumatic. Moist oral mucosal membranes  Eyes: Anicteric, PERRL  Neck: Supple, trachea midline  Lungs:  Clear to auscultation  Heart: Regular rate and rhythm  Abdomen:  Soft, nontender,   Extremities: trace peripheral edema.  Neurologic: Nonfocal, moving all four extremities  Skin: No lesions  Access: RIJ permcath     Lab results: Basic Metabolic Panel: Recent Labs  Lab 05/01/17 1403  K 7.5*    Liver Function Tests: No results for input(s): AST, ALT, ALKPHOS, BILITOT, PROT, ALBUMIN in the last 168 hours. No results for input(s): LIPASE, AMYLASE in the last 168 hours. No results for input(s): AMMONIA in the last 168 hours.  CBC: No results for input(s): WBC, NEUTROABS, HGB, HCT, MCV, PLT in the last 168 hours.  Cardiac Enzymes: No results for input(s): CKTOTAL, CKMB, CKMBINDEX, TROPONINI in the last 168 hours.  BNP: Invalid input(s): POCBNP  CBG: Recent Labs  Lab 05/01/17 1332 05/01/17 1701  GLUCAP 69 92    Microbiology: Results for  orders placed or performed during the hospital encounter of 05/01/17  MRSA PCR Screening     Status: None   Collection Time: 05/01/17  5:11 PM  Result Value Ref Range Status   MRSA by PCR NEGATIVE NEGATIVE Final    Comment:        The GeneXpert MRSA Assay (FDA approved for NASAL specimens only), is one component of a comprehensive MRSA colonization surveillance program. It is not intended to diagnose MRSA infection nor to guide or monitor treatment for MRSA infections. Performed at [facility], [address] [place], [place] [address]     Coagulation Studies: No results for input(s): LABPROT, INR in the last 72 hours.  Urinalysis: No results for input(s): COLORURINE, LABSPEC, PHURINE, GLUCOSEU, HGBUR, BILIRUBINUR, KETONESUR, PROTEINUR, UROBILINOGEN, NITRITE, LEUKOCYTESUR in the last 72 hours.  Invalid input(s): APPERANCEUR    Imaging:  No results found.   Assessment & Plan: Ms. [name] is a 32 y.o. black female with end stage renal disease on hemodialysis, hypertension, status post gastric bypass, diabetes mellitus type II, chronic pain syndrome, depression, history of osteomyelitis, who was admitted to [facility]  on 05/01/2017 for Hyperkalemia [E87.5]  1. End Stage Renal Disease on hemodialysis: with hyperkalemia Emergent hemodialysis tonight. Orders prepared.   2. Hypertension: blood pressure at goal.   3. Anemia of chronic kidney disease: hemoglobin 12.5. Mircera as outpatient.   4. Secondary Hyperparathyroidism:  - cinacalcet - sevelamer with meals.      LOS: 0 Melesa Lecy 4/1/20197:41 PM

## 2017-05-01 NOTE — Progress Notes (Signed)
HD tx end   05/01/17 2102  Vital Signs  Pulse Rate (!) 103  Pulse Rate Source Monitor  Resp 20  BP 122/90  BP Location Right Arm  BP Method Automatic  Patient Position (if appropriate) Lying  Oxygen Therapy  SpO2 100 %  O2 Device Room Air  During Hemodialysis Assessment  Dialysis Fluid Bolus Normal Saline  Bolus Amount (mL) 250 mL  Intra-Hemodialysis Comments Tx completed

## 2017-05-01 NOTE — OR Nursing (Signed)
Pt transferred to 205, procedure rescheduled until tomorrow due to elevated potassium. IV fluid discontinued. Wallace Cullens RN recieved report from [employer].

## 2017-05-01 NOTE — Progress Notes (Signed)
HD tx start   05/01/17 1755  Vital Signs  Pulse Rate 92  Pulse Rate Source Monitor  Resp 18  BP 132/75  BP Location Right Arm  BP Method Automatic  Patient Position (if appropriate) Lying  Oxygen Therapy  SpO2 100 %  O2 Device Room Air  During Hemodialysis Assessment  Blood Flow Rate (mL/min) 400 mL/min  Arterial Pressure (mmHg) -170 mmHg  Venous Pressure (mmHg) 150 mmHg  Transmembrane Pressure (mmHg) 50 mmHg  Ultrafiltration Rate (mL/min) 830 mL/min  Dialysate Flow Rate (mL/min) 600 ml/min  Conductivity: Machine  14.3  HD Safety Checks Performed Yes  Dialysis Fluid Bolus Normal Saline  Bolus Amount (mL) 250 mL  Intra-Hemodialysis Comments Tx initiated

## 2017-05-01 NOTE — Progress Notes (Signed)
Post HD assessment. Pt tolerated tx well without c/o or complications. Net UF 2020, goal met.    05/01/17 2110  Vital Signs  Temp 99.1 F (37.3 C)  Temp Source Oral  Pulse Rate (!) 104  Pulse Rate Source Monitor  Resp 20  BP 116/81  BP Location Right Arm  BP Method Automatic  Patient Position (if appropriate) Lying  Oxygen Therapy  SpO2 100 %  O2 Device Room Air  Dialysis Weight  Weight 108.4 kg (238 lb 15.7 oz)  Type of Weight Post-Dialysis  Post-Hemodialysis Assessment  Rinseback Volume (mL) 250 mL  KECN 67 V  Dialyzer Clearance Lightly streaked  Duration of HD Treatment -hour(s) 3 hour(s)  Hemodialysis Intake (mL) 500 mL  UF Total -Machine (mL) 2520 mL  Net UF (mL) 2020 mL  Tolerated HD Treatment Yes  Education / Care Plan  Dialysis Education Provided Yes  Documented Education in Care Plan Yes

## 2017-05-02 LAB — BASIC METABOLIC PANEL
ANION GAP: 15 (ref 5–15)
BUN: 35 mg/dL — ABNORMAL HIGH (ref 6–20)
CO2: 29 mmol/L (ref 22–32)
Calcium: 7.6 mg/dL — ABNORMAL LOW (ref 8.9–10.3)
Chloride: 97 mmol/L — ABNORMAL LOW (ref 101–111)
Creatinine, Ser: 7.85 mg/dL — ABNORMAL HIGH (ref 0.44–1.00)
GFR calc Af Amer: 7 mL/min — ABNORMAL LOW (ref >60)
GFR calc non Af Amer: 6 mL/min — ABNORMAL LOW (ref >60)
GLUCOSE: 147 mg/dL — AB (ref 65–99)
POTASSIUM: 5.4 mmol/L — AB (ref 3.5–5.1)
Sodium: 141 mmol/L (ref 135–145)

## 2017-05-02 LAB — CBC
HEMATOCRIT: 35.5 % (ref 35.0–47.0)
HEMOGLOBIN: 11.7 g/dL — AB (ref 12.0–16.0)
MCH: 29.1 pg (ref 26.0–34.0)
MCHC: 33 g/dL (ref 32.0–36.0)
MCV: 88.1 fL (ref 80.0–100.0)
Platelets: 179 10*3/uL (ref 150–440)
RBC: 4.03 MIL/uL (ref 3.80–5.20)
RDW: 17.8 % — ABNORMAL HIGH (ref 11.5–14.5)
WBC: 7.1 10*3/uL (ref 3.6–11.0)

## 2017-05-02 LAB — GLUCOSE, CAPILLARY
Glucose-Capillary: 94 mg/dL (ref 65–99)
Glucose-Capillary: 129 mg/dL — ABNORMAL HIGH (ref 65–99)

## 2017-05-02 NOTE — Progress Notes (Signed)
Pre hd 

## 2017-05-02 NOTE — Progress Notes (Signed)
Spoke to Dr Duane Boston at [phone] concerning patient's BP. Patient is not symptomatic- no dizziness, no lethargy, no chest pain, no lightheadedness. Pt has been A&O, conversing with staff.Dr Duane Boston recommended having pt drink some water. Will continue to monitor

## 2017-05-02 NOTE — Discharge Instructions (Signed)
Doses as before Tuesday Thursday Saturday

## 2017-05-02 NOTE — Progress Notes (Signed)
Post hd assessment unchanged  

## 2017-05-02 NOTE — Progress Notes (Signed)
HD stopped due to clotted system with less than 1 hour remaining. No heparin treatment. Total UF =1L. Patient tolerated treatment well. Report called to primary RN. BP currently stable.

## 2017-05-02 NOTE — Discharge Summary (Signed)
[facility] [place] NAME: [name]    MR#:  [id number]  DATE OF BIRTH:  [birth year]  DATE OF ADMISSION:  05/01/2017 ADMITTING PHYSICIAN: Vaughan Basta, MD  DATE OF DISCHARGE: 05/02/2017  PRIMARY CARE PHYSICIAN: [facility]    ADMISSION DIAGNOSIS:  Hyperkalemia [E87.5]  DISCHARGE DIAGNOSIS:  Acute hyperkalemia--improved Malfunctioning HD fistula--- clotting scheduled for 05/04/2017 by Dr. dew  SECONDARY DIAGNOSIS:   Past Medical History:  Diagnosis Date  . Asthma   . Diabetes mellitus   . Dysrhythmia   . 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 Hypertension   . Irregular heart beat   . Morbid obesity ([place])   . Renal insufficiency     HOSPITAL COURSE:    [name]  is a 32 y.o. female with a known history of DM, ESRD on HD, Htn- Her HD days are TTS. Last HD was Thursday- when they noted her Fistula is clotted, so did her HD from permacath. ( She also have permacath). She was out of town, so scheduled her De-clotting procedure today. She did not have HD on Saturday. Noted to have High potassium today.  *Acute hyperkalemia Due to missed HD  -Urgent HD yesterday and today.  Potassium down to 5.4. -To discharge after dialysis today.   -Sinus rhythm on telemetry  * ESRD on HD   malfunction of Dialysis access   Vascular consult she had a.  Patient is scheduled to get declotting procedure on Thursday, April 4  * Hyperlipidemia   Cont pravastatin  * DM   Keep on sliding scale coverage.  * Active smoking    Counseled to quit for 4 min, Offered nicotine patch.  Overall hemodynamically stable.  Patient will discharge after dialysis today.   CONSULTS OBTAINED:  Treatment Team:  Lavonia Dana, MD  DRUG ALLERGIES:   Allergies  Allergen Reactions  . Marcillin [Ampicillin] Rash and Other (See Comments)    Breathing and heart palp  Hot and sweating Has patient had a PCN  reaction causing immediate rash, facial/tongue/throat swelling, SOB or lightheadedness with hypotension: Yes Has patient had a PCN reaction causing severe rash involving mucus membranes or skin necrosis: No Has patient had a PCN reaction that required hospitalization Unknown Has patient had a PCN reaction occurring within the last 10 years: Yes If all of the above answers are "NO", then may proceed with Cephalosporin use.   . Latex Swelling and Rash  . Vancomycin Itching, Nausea Only and Rash    Thick pieces of skin pulls off.    DISCHARGE MEDICATIONS:   Allergies as of 05/02/2017      Reactions   Marcillin [ampicillin] Rash, Other (See Comments)   Breathing and heart palp  Hot and sweating Has patient had a PCN reaction causing immediate rash, facial/tongue/throat swelling, SOB or lightheadedness with hypotension: Yes Has patient had a PCN reaction causing severe rash involving mucus membranes or skin necrosis: No Has patient had a PCN reaction that required hospitalization Unknown Has patient had a PCN reaction occurring within the last 10 years: Yes If all of the above answers are "NO", then may proceed with Cephalosporin use.   Latex Swelling, Rash   Vancomycin Itching, Nausea Only, Rash   Thick pieces of skin pulls off.      Medication List    STOP taking these medications   docusate sodium 100 MG capsule Commonly known as:  COLACE     TAKE  these medications   albuterol 108 (90 Base) MCG/ACT inhaler Commonly known as:  PROVENTIL HFA;VENTOLIN HFA Inhale 2 puffs into the lungs 2 (two) times daily as needed for wheezing or shortness of breath.   aspirin 81 MG tablet Take 81 mg by mouth daily.   cinacalcet 30 MG tablet Commonly known as:  SENSIPAR Take 30 mg by mouth See admin instructions. Given at dialysis Tue Thu Sat   clindamycin 150 MG capsule Commonly known as:  CLEOCIN Take 3 capsules (450 mg total) by mouth 3 (three) times daily. What changed:    how much to  take  when to take this  additional instructions   darbepoetin 200 MCG/0.4ML Soln injection Commonly known as:  ARANESP Inject 200 mcg into the skin every 7 (seven) days. Reported on 07/14/2015   fentaNYL 12 MCG/HR Commonly known as:  DURAGESIC - dosed mcg/hr Place 12.5 mcg onto the skin every 3 (three) days.   insulin glargine 100 UNIT/ML injection Commonly known as:  LANTUS Inject 6 Units into the skin daily as needed (high blood sugar). If blood sugar is over 140   omeprazole 20 MG capsule Commonly known as:  PRILOSEC Take 20 mg by mouth daily.   OVER THE COUNTER MEDICATION Take 5 mLs by mouth daily. nutra burst multivitamin liquid   oxyCODONE-acetaminophen 5-325 MG tablet Commonly known as:  ROXICET Take 1-2 tablets by mouth every 6 (six) hours as needed. What changed:    when to take this  reasons to take this   pravastatin 40 MG tablet Commonly known as:  PRAVACHOL Take 40 mg by mouth at bedtime.   promethazine 12.5 MG tablet Commonly known as:  PHENERGAN Take 12.5 mg by mouth every 6 (six) hours as needed for nausea or vomiting.   RENVELA 0.8 g Pack packet Generic drug:  sevelamer carbonate Take 1 packet by mouth 3 (three) times daily with meals.       If you experience worsening of your admission symptoms, develop shortness of breath, life threatening emergency, suicidal or homicidal thoughts you must seek medical attention immediately by calling 911 or calling your MD immediately  if symptoms less severe.  You Must read complete instructions/literature along with all the possible adverse reactions/side effects for all the Medicines you take and that have been prescribed to you. Take any new Medicines after you have completely understood and accept all the possible adverse reactions/side effects.   Please note  You were cared for by a hospitalist during your hospital stay. If you have any questions about your discharge medications or the care you received  while you were in the hospital after you are discharged, you can call the unit and asked to speak with the hospitalist on call if the hospitalist that took care of you is not available. Once you are discharged, your primary care physician will handle any further medical issues. Please note that NO REFILLS for any discharge medications will be authorized once you are discharged, as it is imperative that you return to your primary care physician (or establish a relationship with a primary care physician if you do not have one) for your aftercare needs so that they can reassess your need for medications and monitor your lab values. Today   SUBJECTIVE   No new complaints. Patient seen at dialysis  VITAL SIGNS:  Blood pressure 100/71, pulse 98, temperature 99 F (37.2 C), temperature source Oral, resp. rate (!) 21, height 5\' 4"  (1.626 m), weight 108.4 kg (238 lb  15.7 oz), SpO2 100 %.  I/O:    Intake/Output Summary (Last 24 hours) at 05/02/2017 1240 Last data filed at 05/02/2017 6203 Gross per 24 hour  Intake -  Output 2020 ml  Net -2020 ml    PHYSICAL EXAMINATION:  GENERAL:  32 y.o.-year-old patient lying in the bed with no acute distress.  EYES: Pupils equal, round, reactive to light and accommodation. No scleral icterus. Extraocular muscles intact.  HEENT: Head atraumatic, normocephalic. Oropharynx and nasopharynx clear.  NECK:  Supple, no jugular venous distention. No thyroid enlargement, no tenderness.  LUNGS: Normal breath sounds bilaterally, no wheezing, rales,rhonchi or crepitation. No use of accessory muscles of respiration.  CARDIOVASCULAR: S1, S2 normal. No murmurs, rubs, or gallops.  ABDOMEN: Soft, non-tender, non-distended. Bowel sounds present. No organomegaly or mass.  EXTREMITIES: No pedal edema, cyanosis, or clubbing.  NEUROLOGIC: Cranial nerves II through XII are intact. Muscle strength 5/5 in all extremities. Sensation intact. Gait not checked.  PSYCHIATRIC: The patient is  alert and oriented x 3.  SKIN: No obvious rash, lesion, or ulcer.   DATA REVIEW:   CBC  Recent Labs  Lab 05/02/17 0347  WBC 7.1  HGB 11.7*  HCT 35.5  PLT 179    Chemistries  Recent Labs  Lab 05/02/17 0347  NA 141  K 5.4*  CL 97*  CO2 29  GLUCOSE 147*  BUN 35*  CREATININE 7.85*  CALCIUM 7.6*    Microbiology Results   Recent Results (from the past 240 hour(s))  MRSA PCR Screening     Status: None   Collection Time: 05/01/17  5:11 PM  Result Value Ref Range Status   MRSA by PCR NEGATIVE NEGATIVE Final    Comment:        The GeneXpert MRSA Assay (FDA approved for NASAL specimens only), is one component of a comprehensive MRSA colonization surveillance program. It is not intended to diagnose MRSA infection nor to guide or monitor treatment for MRSA infections. Performed at [facility], [address] [place],  [address]     RADIOLOGY:  No results found.   Management plans discussed with the patient, family and they are in agreement.  CODE STATUS:     Code Status Orders  (From admission, onward)        Start     Ordered   05/01/17 1651  Full code  Continuous     05/01/17 1650    Code Status History    Date Active Date Inactive Code Status Order ID Comments User Context   01/12/2015 0918 01/12/2015 1306 Full Code [id number]  Algernon Huxley, MD Inpatient      TOTAL TIME TAKING CARE OF THIS PATIENT: *40 minutes.    Fritzi Mandes M.D on 05/02/2017 at 12:40 PM  Between 7am to 6pm - Pager - [phone] After 6pm go to [email or web address] - Proofreader  [facility]  Office  [phone]  CC: Primary care physician; [facility] [place]

## 2017-05-02 NOTE — Care Management (Signed)
Amanda Morris dialysis liaison notified of discharge  

## 2017-05-02 NOTE — Progress Notes (Signed)
[facility]  ROUNDING NOTE   Subjective:   Seen and examined on hemodialysis today. K 5.4 Emergent hemodialysis treatment last night due to hyperkalemia.     HEMODIALYSIS FLOWSHEET:  Blood Flow Rate (mL/min): 400 mL/min Arterial Pressure (mmHg): -150 mmHg Venous Pressure (mmHg): 150 mmHg Transmembrane Pressure (mmHg): 50 mmHg Ultrafiltration Rate (mL/min): 990 mL/min Dialysate Flow Rate (mL/min): 600 ml/min Conductivity: Machine : 14 Conductivity: Machine : 14 Dialysis Fluid Bolus: Normal Saline Bolus Amount (mL): 250 mL Dialysate Change: 2K    Objective:  Vital signs in last 24 hours:  Temp:  [97.8 F (36.6 C)-99.6 F (37.6 C)] 97.9 F (36.6 C) (04/02 1245) Pulse Rate:  [85-116] 95 (04/02 1300) Resp:  [13-24] 15 (04/02 1300) BP: (73-135)/(44-100) 97/59 (04/02 1300) SpO2:  [97 %-100 %] 99 % (04/02 1245) Weight:  [107.6 kg (237 lb 3.4 oz)-110.4 kg (243 lb 6.2 oz)] 107.6 kg (237 lb 3.4 oz) (04/02 1245)  Weight change:  Filed Weights   05/01/17 2110 05/02/17 0950 05/02/17 1245  Weight: 108.4 kg (238 lb 15.7 oz) 109.6 kg (241 lb 10 oz) 107.6 kg (237 lb 3.4 oz)    Intake/Output: I/O last 3 completed shifts: In: -  Out: 2020 [Other:2020]   Intake/Output this shift:  Total I/O In: -  Out: 1085 [Other:1085]  Physical Exam: General: NAD,   Head: Normocephalic, atraumatic. Moist oral mucosal membranes  Eyes: Anicteric, PERRL  Neck: Supple, trachea midline  Lungs:  Clear to auscultation  Heart: Regular rate and rhythm  Abdomen:  Soft, nontender,   Extremities: no peripheral edema.  Neurologic: Nonfocal, moving all four extremities  Skin: No lesions  Access: RIJ permcath, clotted AVF left UE    Basic Metabolic Panel: Recent Labs  Lab 05/01/17 1403 05/01/17 1853 05/02/17 0347  NA  --  139 141  K 7.5* 7.0* 5.4*  CL  --  99* 97*  CO2  --  22 29  GLUCOSE  --  130* 147*  BUN  --  86* 35*  CREATININE  --  13.53*  8.52* 7.85*  CALCIUM  --   7.2* 7.6*  PHOS  --  13.1*  --     Liver Function Tests: Recent Labs  Lab 05/01/17 1853  ALBUMIN 3.6   No results for input(s): LIPASE, AMYLASE in the last 168 hours. No results for input(s): AMMONIA in the last 168 hours.  CBC: Recent Labs  Lab 05/01/17 1853 05/02/17 0347  WBC 7.2 7.1  HGB 11.8* 11.7*  HCT 35.4 35.5  MCV 88.3 88.1  PLT 186 179    Cardiac Enzymes: No results for input(s): CKTOTAL, CKMB, CKMBINDEX, TROPONINI in the last 168 hours.  BNP: Invalid input(s): POCBNP  CBG: Recent Labs  Lab 05/01/17 1332 05/01/17 1701 05/01/17 2241 05/02/17 0740  GLUCAP 69 92 140* 94    Microbiology: Results for orders placed or performed during the hospital encounter of 05/01/17  MRSA PCR Screening     Status: None   Collection Time: 05/01/17  5:11 PM  Result Value Ref Range Status   MRSA by PCR NEGATIVE NEGATIVE Final    Comment:        The GeneXpert MRSA Assay (FDA approved for NASAL specimens only), is one component of a comprehensive MRSA colonization surveillance program. It is not intended to diagnose MRSA infection nor to guide or monitor treatment for MRSA infections. Performed at [facility], [address] [place], [place] [address]     Coagulation Studies: No results for  input(s): LABPROT, INR in the last 72 hours.  Urinalysis: No results for input(s): COLORURINE, LABSPEC, PHURINE, GLUCOSEU, HGBUR, BILIRUBINUR, KETONESUR, PROTEINUR, UROBILINOGEN, NITRITE, LEUKOCYTESUR in the last 72 hours.  Invalid input(s): APPERANCEUR    Imaging: No results found.   Medications:    . aspirin EC  81 mg Oral Daily  . cinacalcet  30 mg Oral Q T,Th,Sa-HD  . [START ON 05/04/2017] fentaNYL  12.5 mcg Transdermal Q72H  . heparin  5,000 Units Subcutaneous Q8H  . insulin aspart  0-9 Units Subcutaneous TID WC  . pantoprazole  40 mg Oral Daily  . pravastatin  40 mg Oral QHS  . sevelamer carbonate  0.8 g Oral TID WC   docusate sodium,  HYDROmorphone (DILAUDID) injection, menthol-cetylpyridinium, ondansetron (ZOFRAN) IV, oxyCODONE-acetaminophen, promethazine  Assessment/ Plan:  Ms. [name] is a 32 y.o. black female Ms. [name] is a 32 y.o. black female with end stage renal disease on hemodialysis, hypertension, status post gastric bypass, diabetes mellitus type II, chronic pain syndrome, depression, history of osteomyelitis, who was admitted to [facility] on 05/01/2017 for Hyperkalemia [E87.5]  1. End Stage Renal Disease on hemodialysis: with hyperkalemia Emergent hemodialysis yesterday.  Seen and examined on hemodialysis  2. Hypertension: blood pressure at goal.   3. Anemia of chronic kidney disease: hemoglobin 11.7.  Mircera as outpatient.   4. Secondary Hyperparathyroidism:  - cinacalcet - sevelamer with meals.      LOS: 1 Daimion Adamcik 4/2/20191:29 PM

## 2017-05-02 NOTE — Progress Notes (Signed)
Pt discharged per MD order. IV removed. Discharge instructions reviewed with pt. Discussed pts BP with Dr. Posey Pronto, okay for discharge as long as systolic over 80 since pts BP has been running low. Last pressure 89/60. Pt declined wheelchair and walked out upon discharge.

## 2017-05-03 ENCOUNTER — Encounter (INDEPENDENT_AMBULATORY_CARE_PROVIDER_SITE_OTHER): Payer: Self-pay

## 2017-05-03 ENCOUNTER — Other Ambulatory Visit (INDEPENDENT_AMBULATORY_CARE_PROVIDER_SITE_OTHER): Payer: Self-pay | Admitting: Vascular Surgery

## 2017-05-03 ENCOUNTER — Other Ambulatory Visit: Payer: Self-pay | Admitting: Internal Medicine

## 2017-05-03 LAB — HIV ANTIBODY (ROUTINE TESTING W REFLEX): HIV SCREEN 4TH GENERATION: NONREACTIVE

## 2017-05-04 ENCOUNTER — Ambulatory Visit: Admission: RE | Admit: 2017-05-04 | Payer: Medicare Other | Source: Ambulatory Visit | Admitting: Vascular Surgery

## 2017-05-09 MED ORDER — CLINDAMYCIN PHOSPHATE 300 MG/50ML IV SOLN: 300.0000 mg | Freq: Once | INTRAVENOUS | Status: DC

## 2017-05-10 ENCOUNTER — Encounter
Admission: RE | Disposition: A | Discharge status: Home / Self Care | Payer: Self-pay | Source: Ambulatory Visit | Attending: Vascular Surgery

## 2017-05-10 ENCOUNTER — Encounter: Payer: Self-pay | Admitting: *Deleted

## 2017-05-10 ENCOUNTER — Inpatient Hospital Stay: Payer: Medicare Other | Admitting: Anesthesiology

## 2017-05-10 ENCOUNTER — Ambulatory Visit
Admission: RE | Admit: 2017-05-10 | Discharge: 2017-05-10 | Disposition: A | Discharge status: Home / Self Care | Payer: Medicare Other | Source: Ambulatory Visit | Attending: Vascular Surgery | Admitting: Vascular Surgery

## 2017-05-10 DIAGNOSIS — Z794 Long term (current) use of insulin: Secondary | ICD-10-CM | POA: Insufficient documentation

## 2017-05-10 DIAGNOSIS — F17210 Nicotine dependence, cigarettes, uncomplicated: Secondary | ICD-10-CM | POA: Insufficient documentation

## 2017-05-10 DIAGNOSIS — Z992 Dependence on renal dialysis: Secondary | ICD-10-CM | POA: Diagnosis not present

## 2017-05-10 DIAGNOSIS — N186 End stage renal disease: Secondary | ICD-10-CM | POA: Insufficient documentation

## 2017-05-10 DIAGNOSIS — Z833 Family history of diabetes mellitus: Secondary | ICD-10-CM | POA: Insufficient documentation

## 2017-05-10 DIAGNOSIS — Z7982 Long term (current) use of aspirin: Secondary | ICD-10-CM | POA: Diagnosis not present

## 2017-05-10 DIAGNOSIS — Z79891 Long term (current) use of opiate analgesic: Secondary | ICD-10-CM | POA: Diagnosis not present

## 2017-05-10 DIAGNOSIS — Z79899 Other long term (current) drug therapy: Secondary | ICD-10-CM | POA: Diagnosis not present

## 2017-05-10 DIAGNOSIS — E1122 Type 2 diabetes mellitus with diabetic chronic kidney disease: Secondary | ICD-10-CM | POA: Insufficient documentation

## 2017-05-10 DIAGNOSIS — Z823 Family history of stroke: Secondary | ICD-10-CM | POA: Diagnosis not present

## 2017-05-10 DIAGNOSIS — I10 Essential (primary) hypertension: Secondary | ICD-10-CM

## 2017-05-10 DIAGNOSIS — Z6841 Body Mass Index (BMI) 40.0 and over, adult: Secondary | ICD-10-CM | POA: Diagnosis not present

## 2017-05-10 DIAGNOSIS — Y832 Surgical operation with anastomosis, bypass or graft as the cause of abnormal reaction of the patient, or of later complication, without mention of misadventure at the time of the procedure: Secondary | ICD-10-CM | POA: Diagnosis not present

## 2017-05-10 DIAGNOSIS — Z8249 Family history of ischemic heart disease and other diseases of the circulatory system: Secondary | ICD-10-CM | POA: Insufficient documentation

## 2017-05-10 DIAGNOSIS — T82868A Thrombosis of vascular prosthetic devices, implants and grafts, initial encounter: Secondary | ICD-10-CM | POA: Diagnosis not present

## 2017-05-10 DIAGNOSIS — I120 Hypertensive chronic kidney disease with stage 5 chronic kidney disease or end stage renal disease: Secondary | ICD-10-CM | POA: Diagnosis not present

## 2017-05-10 DIAGNOSIS — E119 Type 2 diabetes mellitus without complications: Secondary | ICD-10-CM

## 2017-05-10 HISTORY — PX: PERIPHERAL VASCULAR THROMBECTOMY: CATH118306

## 2017-05-10 LAB — POTASSIUM (ARMC VASCULAR LAB ONLY): Potassium (ARMC vascular lab): 4.6 (ref 3.5–5.1)

## 2017-05-10 LAB — GLUCOSE, CAPILLARY: Glucose-Capillary: 97 mg/dL (ref 65–99)

## 2017-05-10 SURGERY — PERIPHERAL VASCULAR THROMBECTOMY
Anesthesia: General

## 2017-05-10 MED ORDER — ONDANSETRON HCL 4 MG/2ML IJ SOLN: 4.0000 mg | Freq: Once | INTRAMUSCULAR | Status: DC | PRN

## 2017-05-10 MED ORDER — MIDAZOLAM HCL 2 MG/2ML IJ SOLN
INTRAMUSCULAR | Status: DC | PRN
  Administered 2017-05-10: 2 mg via INTRAVENOUS

## 2017-05-10 MED ORDER — ALTEPLASE 2 MG IJ SOLR
INTRAMUSCULAR | Status: AC
  Filled 2017-05-10: qty 4

## 2017-05-10 MED ORDER — FENTANYL CITRATE (PF) 100 MCG/2ML IJ SOLN
INTRAMUSCULAR | Status: AC
  Filled 2017-05-10: qty 2

## 2017-05-10 MED ORDER — ONDANSETRON HCL 4 MG/2ML IJ SOLN
4.0000 mg | Freq: Four times a day (QID) | INTRAMUSCULAR | Status: DC | PRN
  Administered 2017-05-10: 4 mg via INTRAVENOUS

## 2017-05-10 MED ORDER — DEXAMETHASONE SODIUM PHOSPHATE 10 MG/ML IJ SOLN
INTRAMUSCULAR | Status: DC | PRN
  Administered 2017-05-10: 10 mg via INTRAVENOUS

## 2017-05-10 MED ORDER — PHENYLEPHRINE HCL 10 MG/ML IJ SOLN
INTRAMUSCULAR | Status: DC | PRN
  Administered 2017-05-10: 40 ug/min via INTRAVENOUS

## 2017-05-10 MED ORDER — FENTANYL CITRATE (PF) 100 MCG/2ML IJ SOLN
INTRAMUSCULAR | Status: DC | PRN
  Administered 2017-05-10 (×2): 50 ug via INTRAVENOUS

## 2017-05-10 MED ORDER — HYDROMORPHONE HCL 1 MG/ML IJ SOLN: 1.0000 mg | Freq: Once | INTRAMUSCULAR | Status: DC | PRN

## 2017-05-10 MED ORDER — PROPOFOL 10 MG/ML IV BOLUS
INTRAVENOUS | Status: DC | PRN
  Administered 2017-05-10: 200 mg via INTRAVENOUS

## 2017-05-10 MED ORDER — SODIUM CHLORIDE 0.9 % IV SOLN
INTRAVENOUS | Status: DC
  Administered 2017-05-10 (×2): via INTRAVENOUS

## 2017-05-10 MED ORDER — CLINDAMYCIN PHOSPHATE 300 MG/50ML IV SOLN
INTRAVENOUS | Status: AC
  Filled 2017-05-10: qty 50

## 2017-05-10 MED ORDER — FAMOTIDINE 20 MG PO TABS: 40.0000 mg | ORAL_TABLET | ORAL | Status: DC | PRN

## 2017-05-10 MED ORDER — MIDAZOLAM HCL 2 MG/2ML IJ SOLN
INTRAMUSCULAR | Status: AC
  Filled 2017-05-10: qty 2

## 2017-05-10 MED ORDER — METHYLPREDNISOLONE SODIUM SUCC 125 MG IJ SOLR: 125.0000 mg | INTRAMUSCULAR | Status: DC | PRN

## 2017-05-10 MED ORDER — LIDOCAINE HCL (CARDIAC) 20 MG/ML IV SOLN
INTRAVENOUS | Status: DC | PRN
  Administered 2017-05-10: 100 mg via INTRAVENOUS

## 2017-05-10 MED ORDER — PROPOFOL 10 MG/ML IV BOLUS
INTRAVENOUS | Status: AC
  Filled 2017-05-10: qty 20

## 2017-05-10 MED ORDER — FENTANYL CITRATE (PF) 100 MCG/2ML IJ SOLN: 25.0000 ug | INTRAMUSCULAR | Status: DC | PRN

## 2017-05-10 MED ORDER — PHENYLEPHRINE HCL 10 MG/ML IJ SOLN
INTRAMUSCULAR | Status: DC | PRN
  Administered 2017-05-10: 100 ug via INTRAVENOUS

## 2017-05-10 MED ORDER — HEPARIN (PORCINE) IN NACL 2-0.9 UNIT/ML-% IJ SOLN
INTRAMUSCULAR | Status: AC
  Filled 2017-05-10: qty 1000

## 2017-05-10 MED ORDER — IOPAMIDOL (ISOVUE-300) INJECTION 61%
INTRAVENOUS | Status: DC | PRN
  Administered 2017-05-10: 10 mL via INTRAVENOUS

## 2017-05-10 SURGICAL SUPPLY — 8 items
CANNULA 5F STIFF (CANNULA) ×3 IMPLANT
DRAPE BRACHIAL (DRAPES) ×3 IMPLANT
PACK ANGIOGRAPHY (CUSTOM PROCEDURE TRAY) ×3 IMPLANT
SET INTRO CAPELLA COAXIAL (SET/KITS/TRAYS/PACK) ×3 IMPLANT
SHEATH BRITE TIP 6FRX5.5 (SHEATH) ×6 IMPLANT
SUT MNCRL AB 4-0 PS2 18 (SUTURE) ×6 IMPLANT
TOWEL OR 17X26 4PK STRL BLUE (TOWEL DISPOSABLE) ×3 IMPLANT
WIRE MAGIC TOR.035 180C (WIRE) ×6 IMPLANT

## 2017-05-10 NOTE — Op Note (Signed)
[facility]    OPERATIVE NOTE   PROCEDURE: 1.  Left upper arteriovenous graft cannulation under ultrasound guidance 2.  Left arm shuntogram   PRE-OPERATIVE DIAGNOSIS: 1. ESRD 2.  Thrombosed left upper arm arteriovenous graft jump graft to a previously failed fistula  POST-OPERATIVE DIAGNOSIS: same as above   SURGEON: Leotis Pain, MD  ANESTHESIA: local with MCS  ESTIMATED BLOOD LOSS: 3 cc  FINDING(S): 1. Thrombosed jump graft with atretic outflow vein and small arterial inflow consistent with a nonsalvageable access.  SPECIMEN(S):  None  CONTRAST: 10 cc  FLUORO TIME: 0.5 minutes  ANESTHESIA: General  INDICATIONS: [name] is a 32 y.o. female who presents with a thrombosed left upper arm jump graft.  At another institution, she had a jump graft performed to a failed left upper arm fistula.  This never really worked and has now been occluded for 2 weeks.  We will bring her in to see if there is any chance of salvage of this access area the patient is aware the risks include but are not limited to: bleeding, infection, thrombosis of the cannulated access, and possible anaphylactic reaction to the contrast.  The patient is aware of the risks of the procedure and elects to proceed forward.  DESCRIPTION: After full informed written consent was obtained, the patient was brought back to the angiography suite and placed supine upon the angiography table.  The patient was connected to monitoring equipment. Moderate conscious sedation was administered during a face to face encounter throughout the procedure with my supervision of the RN administering medicines and monitoring the patient's vital signs, pulse oximetry, telemetry and mental status throughout from the start of the procedure until the patient was taken to the recovery room The left arm was prepped and draped in the standard fashion for a percutaneous access intervention.  Under ultrasound guidance,  the left upper arm jump arteriovenous graft was cannulated with a micropuncture needle under direct ultrasound guidance and a permanent image was performed.  The microwire was advanced into the graft and the needle was exchanged for the a microsheath.  Imaging was then performed through the micro sheath which showed a thrombosed left upper arm jump graft with a small arterial inflow artery and an atretic outflow vein from her failed previous fistula.  This did not appear to be a salvageable situation with any expectation of patency of the access and so I elected to abandon the procedure.  She will need a new surgical access placed.  The sheath was removed.  A sterile bandage was applied to the puncture site.  COMPLICATIONS: None  CONDITION: Stable   Leotis Pain  05/10/2017 8:12 AM    This note was created with Dragon Medical transcription system. Any errors in dictation are purely unintentional.

## 2017-05-10 NOTE — H&P (Signed)
[facility] Admission History & Physical  MRN : [id number]  [name] is a 32 y.o. ([birth year]) female who presents with chief complaint of No chief complaint on file. [name] Kitchen  History of Present Illness: I am asked to evaluate the patient by the dialysis center. The patient was sent here because they were unable to achieve adequate dialysis with her left arm access.  She was here to have this treated last week but her potassium was too high and she elected to go home and return today rather than sooner for her thrombectomy.  The access has been clotted now for well over a week.  It was placed at a different facility and has only been used once or twice.  She still has her catheter in place  Patient denies pain or tenderness overlying the access.  There is no pain with dialysis.  The patient denies hand pain or finger pain consistent with steal syndrome.   There have not any past interventions or declots of this access but it has only been used twice.  The patient is not chronically hypotensive on dialysis.  Current Facility-Administered Medications  Medication Dose Route Frequency Provider Last Rate Last Dose  . 0.9 %  sodium chloride infusion   Intravenous Continuous Stegmayer, Kimberly A, PA-C 10 mL/hr at 05/10/17 [phone]    . clindamycin (CLEOCIN) 300 MG/50ML IVPB           . clindamycin (CLEOCIN) IVPB 300 mg  300 mg Intravenous Once Stegmayer, Kimberly A, PA-C      . famotidine (PEPCID) tablet 40 mg  40 mg Oral PRN Stegmayer, Janalyn Harder, PA-C      . HYDROmorphone (DILAUDID) injection 1 mg  1 mg Intravenous Once PRN Stegmayer, Kimberly A, PA-C      . methylPREDNISolone sodium succinate (SOLU-MEDROL) 125 mg/2 mL injection 125 mg  125 mg Intravenous PRN Stegmayer, Kimberly A, PA-C      . ondansetron (ZOFRAN) injection 4 mg  4 mg Intravenous Q6H PRN Stegmayer, Janalyn Harder, PA-C        Past Medical History:  Diagnosis Date  . Asthma   . Diabetes mellitus   .  Dysrhythmia   . 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 Hypertension   . Irregular heart beat   . Morbid obesity ([place])   . Renal insufficiency     Past Surgical History:  Procedure Laterality Date  . AV FISTULA PLACEMENT  08/26/10   Left Radiocephalic AVF  . AV FISTULA PLACEMENT  08/26/2015   Procedure: ARTERIOVENOUS (AV) FISTULA CREATION;  Surgeon: Algernon Huxley, MD;  Location: [facility];  Service: Vascular;;  . BACK SURGERY    . DIALYSIS/PERMA CATHETER REMOVAL N/A 03/07/2016   Procedure: Dialysis/Perma Catheter Removal;  Surgeon: Algernon Huxley, MD;  Location: [facility];  Service: Cardiovascular;  Laterality: N/A;  . EYE SURGERY     laser photocoagulation  . EYE SURGERY Right 2008   removal of blood clot  . INTRAUTERINE DEVICE (IUD) INSERTION    . PERIPHERAL VASCULAR CATHETERIZATION N/A 07/07/2014   Procedure: A/V Shuntogram/Fistulagram;  Surgeon: Algernon Huxley, MD;  Location: [facility];  Service: Cardiovascular;  Laterality: N/A;  . PERIPHERAL VASCULAR CATHETERIZATION N/A 07/07/2014   Procedure: A/V Shunt Intervention;  Surgeon: Algernon Huxley, MD;  Location: [facility];  Service: Cardiovascular;  Laterality: N/A;  . PERIPHERAL VASCULAR CATHETERIZATION Right 01/12/2015   Procedure: A/V Shuntogram/Fistulagram;  Surgeon: Corene Cornea  Bunnie Domino, MD;  Location: [facility];  Service: Cardiovascular;  Laterality: Right;  . PERIPHERAL VASCULAR CATHETERIZATION N/A 01/12/2015   Procedure: A/V Shunt Intervention;  Surgeon: Algernon Huxley, MD;  Location: [facility];  Service: Cardiovascular;  Laterality: N/A;  . PERIPHERAL VASCULAR CATHETERIZATION Right 06/08/2015   Procedure: A/V Shuntogram/Fistulagram;  Surgeon: Algernon Huxley, MD;  Location: [facility];  Service: Cardiovascular;  Laterality: Right;  . PERIPHERAL VASCULAR CATHETERIZATION N/A 06/08/2015   Procedure: A/V Shunt Intervention;  Surgeon: Algernon Huxley, MD;  Location: [facility];  Service:  Cardiovascular;  Laterality: N/A;  . PERIPHERAL VASCULAR CATHETERIZATION Right 07/14/2015   Procedure: Thrombectomy;  Surgeon: Katha Cabal, MD;  Location: [facility];  Service: Cardiovascular;  Laterality: Right;  . PERIPHERAL VASCULAR CATHETERIZATION N/A 07/14/2015   Procedure: A/V Shuntogram/Fistulagram;  Surgeon: Katha Cabal, MD;  Location: [facility];  Service: Cardiovascular;  Laterality: N/A;  . PERIPHERAL VASCULAR CATHETERIZATION N/A 07/17/2015   Procedure: Thrombectomy;  Surgeon: Katha Cabal, MD;  Location: [facility];  Service: Cardiovascular;  Laterality: N/A;  . PERIPHERAL VASCULAR CATHETERIZATION N/A 07/17/2015   Procedure: Dialysis/Perma Catheter Insertion;  Surgeon: Katha Cabal, MD;  Location: [facility];  Service: Cardiovascular;  Laterality: N/A;  . PERIPHERAL VASCULAR CATHETERIZATION N/A 08/05/2015   Procedure: Thrombectomy;  Surgeon: Katha Cabal, MD;  Location: [facility];  Service: Cardiovascular;  Laterality: N/A;  declot of graft    Social History Social History   Tobacco Use  . Smoking status: Light Tobacco Smoker    Packs/day: 0.25    Years: 10.00    Pack years: 2.50    Types: Cigarettes  . Smokeless tobacco: Never Used  Substance Use Topics  . Alcohol use: No  . Drug use: No    Family History Family History  Problem Relation Age of Onset  . Diabetes Mother   . Heart attack Mother   . Stroke Mother   . High Cholesterol Mother   . Diabetes Father     No family history of bleeding or clotting disorders, autoimmune disease or porphyria  Allergies  Allergen Reactions  . Marcillin [Ampicillin] Rash and Other (See Comments)    Breathing and heart palp  Hot and sweating Has patient had a PCN reaction causing immediate rash, facial/tongue/throat swelling, SOB or lightheadedness with hypotension: Yes Has patient had a PCN reaction causing severe rash involving mucus membranes or skin  necrosis: No Has patient had a PCN reaction that required hospitalization Unknown Has patient had a PCN reaction occurring within the last 10 years: Yes If all of the above answers are "NO", then may proceed with Cephalosporin use.   . Latex Swelling and Rash  . Vancomycin Itching, Nausea Only and Rash    Thick pieces of skin pulls off.     REVIEW OF SYSTEMS (Negative unless checked)  Constitutional: [] Weight loss  [] Fever  [] Chills Cardiac: [] Chest pain   [] Chest pressure   [] Palpitations   [] Shortness of breath when laying flat   [] Shortness of breath at rest   [x] Shortness of breath with exertion. Vascular:  [] Pain in legs with walking   [] Pain in legs at rest   [] Pain in legs when laying flat   [] Claudication   [] Pain in feet when walking  [] Pain in feet at rest  [] Pain in feet when laying flat   [] History of DVT   [] Phlebitis   [] Swelling  in legs   [] Varicose veins   [] Non-healing ulcers Pulmonary:   [] Uses home oxygen   [] Productive cough   [] Hemoptysis   [] Wheeze  [] COPD   [] Asthma Neurologic:  [] Dizziness  [] Blackouts   [] Seizures   [] History of stroke   [] History of TIA  [] Aphasia   [] Temporary blindness   [] Dysphagia   [] Weakness or numbness in arms   [] Weakness or numbness in legs Musculoskeletal:  [] Arthritis   [] Joint swelling   [] Joint pain   [] Low back pain Hematologic:  [] Easy bruising  [] Easy bleeding   [] Hypercoagulable state   [] Anemic  [] Hepatitis Gastrointestinal:  [] Blood in stool   [] Vomiting blood  [] Gastroesophageal reflux/heartburn   [] Difficulty swallowing. Genitourinary:  [x] Chronic kidney disease   [] Difficult urination  [] Frequent urination  [] Burning with urination   [] Blood in urine Skin:  [] Rashes   [] Ulcers   [] Wounds Psychological:  [] History of anxiety   []  History of major depression.  Physical Examination  Vitals:   05/10/17 0633  BP: 113/74  Pulse: 87  Resp: 18  Temp: 98.2 F (36.8 C)  SpO2: 99%  Weight: 107.5 kg (237 lb)  Height: 5\' 4"   (1.626 m)   Body mass index is 40.68 kg/m. Gen: WD/WN, NAD Head: [place]/AT, No temporalis wasting.  Ear/Nose/Throat: Hearing grossly intact, nares w/o erythema or drainage, oropharynx w/o Erythema/Exudate,  Eyes: Conjunctiva clear, sclera non-icteric Neck: Trachea midline.  No JVD.  Pulmonary:  Good air movement, respirations not labored, no use of accessory muscles.  Cardiac: RRR, normal S1, S2. Vascular: no thrill in left arm access Vessel Right Left                   Musculoskeletal: M/S 5/5 throughout.  Extremities without ischemic changes.  No deformity or atrophy.  Neurologic: Sensation grossly intact in extremities.  Symmetrical.  Speech is fluent. Motor exam as listed above. Psychiatric: Judgment intact, Mood & affect appropriate for pt's clinical situation. Dermatologic: No rashes or ulcers noted.  No cellulitis or open wounds.    CBC Lab Results  Component Value Date   WBC 7.1 05/02/2017   HGB 11.7 (L) 05/02/2017   HCT 35.5 05/02/2017   MCV 88.1 05/02/2017   PLT 179 05/02/2017    BMET    Component Value Date/Time   NA 141 05/02/2017 0347   NA 132 (L) 02/03/2014 1007   K 5.4 (H) 05/02/2017 0347   K 4.7 02/03/2014 1007   CL 97 (L) 05/02/2017 0347   CL 93 (L) 02/03/2014 1007   CO2 29 05/02/2017 0347   CO2 25 02/03/2014 1007   GLUCOSE 147 (H) 05/02/2017 0347   GLUCOSE 258 (H) 02/03/2014 1007   BUN 35 (H) 05/02/2017 0347   BUN 58 (H) 02/03/2014 1007   CREATININE 7.85 (H) 05/02/2017 0347   CREATININE 11.14 (H) 02/03/2014 1007   CALCIUM 7.6 (L) 05/02/2017 0347   CALCIUM 8.4 (L) 02/03/2014 1007   CALCIUM 8.1 (L) 08/23/2010 1000   GFRNONAA 6 (L) 05/02/2017 0347   GFRNONAA 4 (L) 02/03/2014 1007   GFRNONAA 5 (L) 07/19/2013 0416   GFRAA 7 (L) 05/02/2017 0347   GFRAA 5 (L) 02/03/2014 1007   GFRAA 6 (L) 07/19/2013 0416   Estimated Creatinine Clearance: 12.1 mL/min (A) (by C-G formula based on SCr of 7.85 mg/dL (H)).  COAG Lab Results  Component Value Date    INR 1.11 08/19/2015   INR 1.1 04/08/2013   INR 0.9 03/23/2011    Radiology No results found.  Assessment/Plan  1.  Complication dialysis device with thrombosis AV access:  Patient's left arm dialysis access is malfunctioning. The patient will undergo angiography and correction of any problems using interventional techniques with the hope of restoring function to the access.  The risks and benefits were described to the patient.  All questions were answered.  The patient agrees to proceed with angiography and intervention. Potassium will be drawn to ensure that it is an appropriate level prior to performing intervention. 2.  End-stage renal disease requiring hemodialysis:  Patient will continue dialysis therapy without further interruption. If a successful intervention is not achieved then a tunneled catheter is already in place. Dialysis has already been arranged. 3.  Hypertension:  Patient will continue medical management; nephrology is following no changes in oral medications. 4. Diabetes mellitus:  Glucose will be monitored and oral medications been held this morning once the patient has undergone the patient's procedure po intake will be reinitiated and again Accu-Cheks will be used to assess the blood glucose level and treat as needed. The patient will be restarted on the patient's usual hypoglycemic regime     Leotis Pain, MD  05/10/2017 7:39 AM

## 2017-05-10 NOTE — Anesthesia Postprocedure Evaluation (Signed)
Anesthesia Post Note  Patient: [name]  Procedure(s) Performed: PERIPHERAL VASCULAR THROMBECTOMY (N/A )  Patient location during evaluation: PACU Anesthesia Type: General Level of consciousness: awake and alert Pain management: pain level controlled Vital Signs Assessment: post-procedure vital signs reviewed and stable Respiratory status: spontaneous breathing and respiratory function stable Cardiovascular status: stable Anesthetic complications: no     Last Vitals:  Vitals:   05/10/17 0821 05/10/17 0836  BP: 103/67 116/83  Pulse: 80 87  Resp: 14 15  Temp: (!) 36.1 C   SpO2: 100% 100%    Last Pain:  Vitals:   05/10/17 0836  PainSc: 0-No pain                 Alyssa Mancera K

## 2017-05-10 NOTE — Anesthesia Preprocedure Evaluation (Signed)
Anesthesia Evaluation  Patient identified by MRN, date of birth, ID band Patient awake    Reviewed: Allergy & Precautions, NPO status , Patient's Chart, lab work & pertinent test results  Airway Mallampati: II  TM Distance: >3 FB Neck ROM: Full    Dental  (+) Chipped   Pulmonary asthma , Current Smoker,    Pulmonary exam normal        Cardiovascular hypertension, Pt. on medications and Pt. on home beta blockers Normal cardiovascular exam+ dysrhythmias      Neuro/Psych negative neurological ROS  negative psych ROS   GI/Hepatic negative GI ROS, Neg liver ROS,   Endo/Other  diabetes, Well Controlled, Type 2, Oral Hypoglycemic Agents  Renal/GU ESRFRenal disease     Musculoskeletal negative musculoskeletal ROS (+)   Abdominal Normal abdominal exam  (+)   Peds negative pediatric ROS (+)  Hematology  (+) anemia ,   Anesthesia Other Findings   Reproductive/Obstetrics                             Anesthesia Physical  Anesthesia Plan  ASA: III  Anesthesia Plan: General   Post-op Pain Management:    Induction: Intravenous  PONV Risk Score and Plan: 1 and Ondansetron, Dexamethasone and Midazolam  Airway Management Planned: LMA  Additional Equipment:   Intra-op Plan:   Post-operative Plan: Extubation in OR  Informed Consent: I have reviewed the patients History and Physical, chart, labs and discussed the procedure including the risks, benefits and alternatives for the proposed anesthesia with the patient or authorized representative who has indicated his/her understanding and acceptance.   Dental advisory given  Plan Discussed with: CRNA and Surgeon  Anesthesia Plan Comments:         Anesthesia Quick Evaluation

## 2017-05-10 NOTE — Anesthesia Post-op Follow-up Note (Signed)
Anesthesia QCDR form completed.        

## 2017-05-10 NOTE — Anesthesia Procedure Notes (Signed)
Procedure Name: LMA Insertion Date/Time: 05/10/2017 8:01 AM Performed by: Nelda Marseille, CRNA Pre-anesthesia Checklist: Patient identified, Patient being monitored, Timeout performed, Emergency Drugs available and Suction available Patient Re-evaluated:Patient Re-evaluated prior to induction Oxygen Delivery Method: Circle system utilized Preoxygenation: Pre-oxygenation with 100% oxygen Induction Type: IV induction Ventilation: Mask ventilation without difficulty LMA: LMA inserted LMA Size: 4.0 Tube type: Oral Number of attempts: 1 Placement Confirmation: positive ETCO2 and breath sounds checked- equal and bilateral Tube secured with: Tape Dental Injury: Teeth and Oropharynx as per pre-operative assessment

## 2017-05-10 NOTE — Transfer of Care (Signed)
Immediate Anesthesia Transfer of Care Note  Patient: [name]  Procedure(s) Performed: PERIPHERAL VASCULAR THROMBECTOMY (N/A )  Patient Location: PACU  Anesthesia Type:General  Level of Consciousness: awake, alert  and oriented  Airway & Oxygen Therapy: Patient Spontanous Breathing and Patient connected to nasal cannula oxygen  Post-op Assessment: Report given to RN and Post -op Vital signs reviewed and stable  Post vital signs: Reviewed and stable  Last Vitals:  Vitals Value Taken Time  BP 103/67 05/10/2017  8:21 AM  Temp 36.1 C 05/10/2017  8:21 AM  Pulse 80 05/10/2017  8:21 AM  Resp 14 05/10/2017  8:21 AM  SpO2 100 % 05/10/2017  8:21 AM    Last Pain:  Vitals:   05/10/17 0633  PainSc: 5          Complications: No apparent anesthesia complications

## 2017-05-19 ENCOUNTER — Inpatient Hospital Stay: Admission: RE | Admit: 2017-05-19 | Payer: Medicare Other | Source: Ambulatory Visit

## 2017-05-23 ENCOUNTER — Encounter
Admission: RE | Admit: 2017-05-23 | Discharge: 2017-05-23 | Disposition: A | Discharge status: Home / Self Care | Payer: Medicare Other | Source: Ambulatory Visit | Attending: Orthopedic Surgery | Admitting: Orthopedic Surgery

## 2017-05-23 ENCOUNTER — Other Ambulatory Visit: Payer: Self-pay

## 2017-05-23 DIAGNOSIS — K219 Gastro-esophageal reflux disease without esophagitis: Secondary | ICD-10-CM | POA: Diagnosis not present

## 2017-05-23 DIAGNOSIS — Z992 Dependence on renal dialysis: Secondary | ICD-10-CM | POA: Diagnosis not present

## 2017-05-23 DIAGNOSIS — J45909 Unspecified asthma, uncomplicated: Secondary | ICD-10-CM | POA: Diagnosis not present

## 2017-05-23 DIAGNOSIS — Z538 Procedure and treatment not carried out for other reasons: Secondary | ICD-10-CM | POA: Diagnosis not present

## 2017-05-23 DIAGNOSIS — Z9884 Bariatric surgery status: Secondary | ICD-10-CM | POA: Diagnosis not present

## 2017-05-23 DIAGNOSIS — Z880 Allergy status to penicillin: Secondary | ICD-10-CM | POA: Diagnosis not present

## 2017-05-23 DIAGNOSIS — G5602 Carpal tunnel syndrome, left upper limb: Secondary | ICD-10-CM | POA: Diagnosis not present

## 2017-05-23 DIAGNOSIS — I129 Hypertensive chronic kidney disease with stage 1 through stage 4 chronic kidney disease, or unspecified chronic kidney disease: Secondary | ICD-10-CM | POA: Diagnosis not present

## 2017-05-23 DIAGNOSIS — Z79899 Other long term (current) drug therapy: Secondary | ICD-10-CM | POA: Diagnosis not present

## 2017-05-23 DIAGNOSIS — Z89411 Acquired absence of right great toe: Secondary | ICD-10-CM | POA: Diagnosis not present

## 2017-05-23 DIAGNOSIS — E1122 Type 2 diabetes mellitus with diabetic chronic kidney disease: Secondary | ICD-10-CM | POA: Diagnosis not present

## 2017-05-23 DIAGNOSIS — Z91040 Latex allergy status: Secondary | ICD-10-CM | POA: Diagnosis not present

## 2017-05-23 DIAGNOSIS — N183 Chronic kidney disease, stage 3 (moderate): Secondary | ICD-10-CM | POA: Diagnosis not present

## 2017-05-23 DIAGNOSIS — Z89412 Acquired absence of left great toe: Secondary | ICD-10-CM | POA: Diagnosis not present

## 2017-05-23 DIAGNOSIS — Z6841 Body Mass Index (BMI) 40.0 and over, adult: Secondary | ICD-10-CM | POA: Diagnosis not present

## 2017-05-23 DIAGNOSIS — E1140 Type 2 diabetes mellitus with diabetic neuropathy, unspecified: Secondary | ICD-10-CM | POA: Diagnosis not present

## 2017-05-23 DIAGNOSIS — Z794 Long term (current) use of insulin: Secondary | ICD-10-CM | POA: Diagnosis not present

## 2017-05-23 HISTORY — DX: Gastro-esophageal reflux disease without esophagitis: K21.9

## 2017-05-23 HISTORY — DX: Unspecified convulsions: R56.9

## 2017-05-23 HISTORY — DX: Sleep apnea, unspecified: G47.30

## 2017-05-23 HISTORY — DX: Anemia, unspecified: D64.9

## 2017-05-23 LAB — SURGICAL PCR SCREEN
MRSA, PCR: NEGATIVE
STAPHYLOCOCCUS AUREUS: NEGATIVE

## 2017-05-23 NOTE — Patient Instructions (Signed)
Your procedure is scheduled on: Friday 05/26/17 Report to [facility]. To find out your arrival time please call [phone] between 1PM - 3PM on Thursday 05/25/17.  Remember: Instructions that are not followed completely may result in serious medical risk, up to and including death, or upon the discretion of your surgeon and anesthesiologist your surgery may need to be rescheduled.     _X__ 1. Do not eat food after midnight the night before your procedure.                 No gum chewing or hard candies. You may drink clear liquids up to 2 hours                 before you are scheduled to arrive for your surgery- DO not drink clear                 liquids within 2 hours of the start of your surgery.                 Clear Liquids include:  water, apple juice without pulp, clear carbohydrate                 drink such as Clearfast or Gatorade, Black Coffee or Tea (Do not add                 anything to coffee or tea).  __X__2.  On the morning of surgery brush your teeth with toothpaste and water, you                 may rinse your mouth with mouthwash if you wish.  Do not swallow any              toothpaste of mouthwash.     _X__ 3.  No Alcohol for 24 hours before or after surgery.   _X__ 4.  Do Not Smoke or use e-cigarettes For 24 Hours Prior to Your Surgery.                 Do not use any chewable tobacco products for at least 6 hours prior to                 surgery.  ____  5.  Bring all medications with you on the day of surgery if instructed.   __X__  6.  Notify your doctor if there is any change in your medical condition      (cold, fever, infections).     Do not wear jewelry, make-up, hairpins, clips or nail polish. Do not wear lotions, powders, or perfumes.  Do not shave 48 hours prior to surgery. Men may shave face and neck. Do not bring valuables to the hospital.    [facility] is not responsible for any belongings or  valuables.  Contacts, dentures/partials or body piercings may not be worn into surgery. Bring a case for your contacts, glasses or hearing aids, a denture cup will be supplied. Leave your suitcase in the car. After surgery it may be brought to your room. For patients admitted to the hospital, discharge time is determined by your treatment team.   Patients discharged the day of surgery will not be allowed to drive home.   Please read over the following fact sheets that you were given:   MRSA Information  __X__ Take these medicines the morning of surgery with A SIP OF WATER:  1. omeprazole  2. Oxycodone only if needed  3.   4.  5.  6.  ____ Fleet Enema (as directed)   __X__ Use CHG Soap/SAGE wipes as directed  __X__ Use inhalers on the day of surgery  ____ Stop metformin/Janumet/Farxiga 2 days prior to surgery    __X__ Take 1/2 of usual insulin dose the night before surgery. No insulin the morning          of surgery.   ____ Stop Blood Thinners Coumadin/Plavix/Xarelto/Pleta/Pradaxa/Eliquis/Effient/Aspirin  on   Or contact your Surgeon, Cardiologist or Medical Doctor regarding  ability to stop your blood thinners  __X__ Stop Anti-inflammatories 7 days before surgery such as Advil, Ibuprofen, Motrin,  BC or Goodies Powder, Naprosyn, Naproxen, Aleve, Aspirin    __X__ Stop all herbal supplements, fish oil or vitamin E until after surgery.    ____ Bring C-Pap to the hospital.

## 2017-05-25 MED ORDER — CLINDAMYCIN PHOSPHATE 300 MG/50ML IV SOLN: 300.0000 mg | Freq: Once | INTRAVENOUS | Status: DC

## 2017-05-26 ENCOUNTER — Ambulatory Visit: Payer: Medicare Other | Admitting: Anesthesiology

## 2017-05-26 ENCOUNTER — Other Ambulatory Visit: Payer: Self-pay

## 2017-05-26 ENCOUNTER — Ambulatory Visit
Admission: RE | Admit: 2017-05-26 | Discharge: 2017-05-26 | Disposition: A | Discharge status: Home / Self Care | Payer: Medicare Other | Source: Ambulatory Visit | Attending: Orthopedic Surgery | Admitting: Orthopedic Surgery

## 2017-05-26 ENCOUNTER — Encounter
Admission: RE | Disposition: A | Discharge status: Home / Self Care | Payer: Self-pay | Source: Ambulatory Visit | Attending: Orthopedic Surgery

## 2017-05-26 DIAGNOSIS — Z992 Dependence on renal dialysis: Secondary | ICD-10-CM | POA: Insufficient documentation

## 2017-05-26 DIAGNOSIS — E1140 Type 2 diabetes mellitus with diabetic neuropathy, unspecified: Secondary | ICD-10-CM | POA: Insufficient documentation

## 2017-05-26 DIAGNOSIS — I129 Hypertensive chronic kidney disease with stage 1 through stage 4 chronic kidney disease, or unspecified chronic kidney disease: Secondary | ICD-10-CM | POA: Insufficient documentation

## 2017-05-26 DIAGNOSIS — Z79899 Other long term (current) drug therapy: Secondary | ICD-10-CM | POA: Insufficient documentation

## 2017-05-26 DIAGNOSIS — Z6841 Body Mass Index (BMI) 40.0 and over, adult: Secondary | ICD-10-CM | POA: Insufficient documentation

## 2017-05-26 DIAGNOSIS — Z880 Allergy status to penicillin: Secondary | ICD-10-CM | POA: Insufficient documentation

## 2017-05-26 DIAGNOSIS — N183 Chronic kidney disease, stage 3 (moderate): Secondary | ICD-10-CM | POA: Insufficient documentation

## 2017-05-26 DIAGNOSIS — E1122 Type 2 diabetes mellitus with diabetic chronic kidney disease: Secondary | ICD-10-CM | POA: Insufficient documentation

## 2017-05-26 DIAGNOSIS — Z89411 Acquired absence of right great toe: Secondary | ICD-10-CM | POA: Insufficient documentation

## 2017-05-26 DIAGNOSIS — Z794 Long term (current) use of insulin: Secondary | ICD-10-CM | POA: Insufficient documentation

## 2017-05-26 DIAGNOSIS — Z89412 Acquired absence of left great toe: Secondary | ICD-10-CM | POA: Insufficient documentation

## 2017-05-26 DIAGNOSIS — G5602 Carpal tunnel syndrome, left upper limb: Secondary | ICD-10-CM | POA: Diagnosis not present

## 2017-05-26 DIAGNOSIS — K219 Gastro-esophageal reflux disease without esophagitis: Secondary | ICD-10-CM | POA: Insufficient documentation

## 2017-05-26 DIAGNOSIS — Z91040 Latex allergy status: Secondary | ICD-10-CM | POA: Insufficient documentation

## 2017-05-26 DIAGNOSIS — Z538 Procedure and treatment not carried out for other reasons: Secondary | ICD-10-CM | POA: Insufficient documentation

## 2017-05-26 DIAGNOSIS — Z9884 Bariatric surgery status: Secondary | ICD-10-CM | POA: Insufficient documentation

## 2017-05-26 DIAGNOSIS — J45909 Unspecified asthma, uncomplicated: Secondary | ICD-10-CM | POA: Insufficient documentation

## 2017-05-26 HISTORY — PX: CARPAL TUNNEL RELEASE: SHX101

## 2017-05-26 LAB — POCT I-STAT 4, (NA,K, GLUC, HGB,HCT)
Glucose, Bld: 99 mg/dL (ref 65–99)
HEMATOCRIT: 38 % (ref 36.0–46.0)
HEMOGLOBIN: 12.9 g/dL (ref 12.0–15.0)
POTASSIUM: 5.5 mmol/L — AB (ref 3.5–5.1)
SODIUM: 140 mmol/L (ref 135–145)

## 2017-05-26 LAB — GLUCOSE, CAPILLARY: Glucose-Capillary: 92 mg/dL (ref 65–99)

## 2017-05-26 LAB — HCG, QUANTITATIVE, PREGNANCY: HCG, BETA CHAIN, QUANT, S: 6 m[IU]/mL — AB (ref <5)

## 2017-05-26 SURGERY — CARPAL TUNNEL RELEASE
Anesthesia: General | Laterality: Left | Wound class: "Clean "

## 2017-05-26 MED ORDER — PROPOFOL 10 MG/ML IV BOLUS
INTRAVENOUS | Status: AC
  Filled 2017-05-26: qty 20

## 2017-05-26 MED ORDER — BUPIVACAINE HCL (PF) 0.5 % IJ SOLN
INTRAMUSCULAR | Status: AC
  Filled 2017-05-26: qty 30

## 2017-05-26 MED ORDER — SODIUM CHLORIDE 0.9 % IV SOLN: INTRAVENOUS | Status: DC

## 2017-05-26 MED ORDER — FENTANYL CITRATE (PF) 100 MCG/2ML IJ SOLN
INTRAMUSCULAR | Status: AC
  Filled 2017-05-26: qty 2

## 2017-05-26 MED ORDER — SUCCINYLCHOLINE CHLORIDE 20 MG/ML IJ SOLN
INTRAMUSCULAR | Status: AC
  Filled 2017-05-26: qty 1

## 2017-05-26 MED ORDER — MIDAZOLAM HCL 2 MG/2ML IJ SOLN
INTRAMUSCULAR | Status: AC
  Filled 2017-05-26: qty 2

## 2017-05-26 MED ORDER — CLINDAMYCIN PHOSPHATE 300 MG/50ML IV SOLN
INTRAVENOUS | Status: AC
  Filled 2017-05-26: qty 50

## 2017-05-26 MED ORDER — LIDOCAINE HCL (PF) 2 % IJ SOLN
INTRAMUSCULAR | Status: AC
  Filled 2017-05-26: qty 10

## 2017-05-26 SURGICAL SUPPLY — 23 items
BANDAGE ACE 3X5.8 VEL STRL LF (GAUZE/BANDAGES/DRESSINGS) ×3 IMPLANT
CANISTER SUCT 1200ML W/VALVE (MISCELLANEOUS) ×3 IMPLANT
CHLORAPREP W/TINT 26ML (MISCELLANEOUS) ×3 IMPLANT
CUFF TOURN 18 STER (MISCELLANEOUS) IMPLANT
ELECT CAUTERY NDL 2.0 MIC (NEEDLE) IMPLANT
ELECT CAUTERY NEEDLE 2.0 MIC (NEEDLE) IMPLANT
GAUZE PETRO XEROFOAM 1X8 (MISCELLANEOUS) ×3 IMPLANT
GAUZE SPONGE 4X4 12PLY STRL (GAUZE/BANDAGES/DRESSINGS) ×3 IMPLANT
GLOVE SURG SYN 9.0  PF PI (GLOVE) ×2
GLOVE SURG SYN 9.0 PF PI (GLOVE) ×1 IMPLANT
GOWN SRG 2XL LVL 4 RGLN SLV (GOWNS) ×1 IMPLANT
GOWN STRL NON-REIN 2XL LVL4 (GOWNS) ×2
GOWN STRL REUS W/ TWL LRG LVL3 (GOWN DISPOSABLE) ×1 IMPLANT
GOWN STRL REUS W/TWL LRG LVL3 (GOWN DISPOSABLE) ×2
KIT TURNOVER KIT A (KITS) ×3 IMPLANT
NS IRRIG 500ML POUR BTL (IV SOLUTION) ×3 IMPLANT
PACK EXTREMITY ARMC (MISCELLANEOUS) ×1 IMPLANT
PAD CAST CTTN 4X4 STRL (SOFTGOODS) ×1 IMPLANT
PADDING CAST COTTON 4X4 STRL (SOFTGOODS) ×2
SCALPEL PROTECTED #15 DISP (BLADE) ×6 IMPLANT
SUT ETHILON 4-0 (SUTURE)
SUT ETHILON 4-0 FS2 18XMFL BLK (SUTURE)
SUTURE ETHLN 4-0 FS2 18XMF BLK (SUTURE) ×1 IMPLANT

## 2017-05-26 NOTE — Anesthesia Preprocedure Evaluation (Addendum)
Anesthesia Evaluation  Patient identified by MRN, date of birth, ID band Patient awake    Reviewed: Allergy & Precautions, H&P , NPO status , Patient's Chart, lab work & pertinent test results  History of Anesthesia Complications Negative for: history of anesthetic complications  Airway Mallampati: II  TM Distance: >3 FB Neck ROM: full    Dental  (+) Chipped, Poor Dentition   Pulmonary asthma , sleep apnea , Current Smoker,           Cardiovascular Exercise Tolerance: Good hypertension, (-) angina(-) Past MI and (-) DOE + dysrhythmias      Neuro/Psych Seizures -,  negative psych ROS   GI/Hepatic Neg liver ROS, GERD  Medicated and Controlled,  Endo/Other  diabetes, Type 2  Renal/GU DialysisRenal disease     Musculoskeletal   Abdominal   Peds  Hematology negative hematology ROS (+)   Anesthesia Other Findings Past Medical History: No date: Anemia No date: Asthma No date: Diabetes mellitus No date: Dysrhythmia Jun 17 2015: Gastric bypass status for obesity No date: GERD (gastroesophageal reflux disease) No date: Hypertension No date: Irregular heart beat No date: Morbid obesity ([place]) No date: Renal insufficiency No date: Seizures ([place]) No date: Sleep apnea  Past Surgical History: 08/26/10: AV FISTULA PLACEMENT     Comment:  Left Radiocephalic AVF 4/40/3474: AV FISTULA PLACEMENT     Comment:  Procedure: ARTERIOVENOUS (AV) FISTULA CREATION;                Surgeon: Algernon Huxley, MD;  Location: [facility] ORS;  Service:               Vascular;; No date: BACK SURGERY 03/07/2016: DIALYSIS/PERMA CATHETER REMOVAL; N/A     Comment:  Procedure: Dialysis/Perma Catheter Removal;  Surgeon:               Algernon Huxley, MD;  Location: [place] [facility];                Service: Cardiovascular;  Laterality: N/A; No date: EYE SURGERY     Comment:  laser photocoagulation 2008: EYE SURGERY; Right     Comment:  removal of blood  clot No date: INTRAUTERINE DEVICE (IUD) INSERTION 07/07/2014: PERIPHERAL VASCULAR CATHETERIZATION; N/A     Comment:  Procedure: A/V Shuntogram/Fistulagram;  Surgeon: Algernon Huxley, MD;  Location: [place] [facility];  Service:               Cardiovascular;  Laterality: N/A; 07/07/2014: PERIPHERAL VASCULAR CATHETERIZATION; N/A     Comment:  Procedure: A/V Shunt Intervention;  Surgeon: Algernon Huxley, MD;  Location: [place] [facility];  Service:               Cardiovascular;  Laterality: N/A; 01/12/2015: PERIPHERAL VASCULAR CATHETERIZATION; Right     Comment:  Procedure: A/V Shuntogram/Fistulagram;  Surgeon: Algernon Huxley, MD;  Location: [place] [facility];  Service:               Cardiovascular;  Laterality: Right; 01/12/2015: PERIPHERAL VASCULAR CATHETERIZATION; N/A     Comment:  Procedure: A/V Shunt Intervention;  Surgeon: Algernon Huxley, MD;  Location: [place] [facility]  [facility];  Service:               Cardiovascular;  Laterality: N/A; 06/08/2015: PERIPHERAL VASCULAR CATHETERIZATION; Right     Comment:  Procedure: A/V Shuntogram/Fistulagram;  Surgeon: Algernon Huxley, MD;  Location: [place] [facility];  Service:               Cardiovascular;  Laterality: Right; 06/08/2015: PERIPHERAL VASCULAR CATHETERIZATION; N/A     Comment:  Procedure: A/V Shunt Intervention;  Surgeon: Algernon Huxley, MD;  Location: [place] [facility];  Service:               Cardiovascular;  Laterality: N/A; 07/14/2015: PERIPHERAL VASCULAR CATHETERIZATION; Right     Comment:  Procedure: Thrombectomy;  Surgeon: Katha Cabal,               MD;  Location: [place] [facility];  Service:               Cardiovascular;  Laterality: Right; 07/14/2015: PERIPHERAL VASCULAR CATHETERIZATION; N/A     Comment:  Procedure: A/V Shuntogram/Fistulagram;  Surgeon: Katha Cabal, MD;  Location: [place] [facility];  Service:               Cardiovascular;  Laterality: N/A; 07/17/2015: PERIPHERAL VASCULAR CATHETERIZATION; N/A     Comment:  Procedure: Thrombectomy;  Surgeon: Katha Cabal,               MD;  Location: [place] [facility];  Service:               Cardiovascular;  Laterality: N/A; 07/17/2015: PERIPHERAL VASCULAR CATHETERIZATION; N/A     Comment:  Procedure: Dialysis/Perma Catheter Insertion;  Surgeon:               Katha Cabal, MD;  Location: [place] [facility];                Service: Cardiovascular;  Laterality: N/A; 08/05/2015: PERIPHERAL VASCULAR CATHETERIZATION; N/A     Comment:  Procedure: Thrombectomy;  Surgeon: Katha Cabal,               MD;  Location: [place] [facility];  Service:               Cardiovascular;  Laterality: N/A;  declot of graft 05/10/2017: PERIPHERAL VASCULAR THROMBECTOMY; N/A     Comment:  Procedure: PERIPHERAL VASCULAR THROMBECTOMY;  Surgeon:               Algernon Huxley, MD;  Location: [place] [facility];                Service: Cardiovascular;  Laterality: N/A; No date: TOE AMPUTATION     Comment:  both big toes removed  BMI    Body Mass Index:  40.68 kg/m      Reproductive/Obstetrics negative OB ROS                             Anesthesia Physical Anesthesia Plan  ASA: III  Anesthesia Plan: General LMA   Post-op Pain Management:    Induction: Intravenous  PONV Risk Score and Plan: Ondansetron, Dexamethasone and Midazolam  Airway Management  Planned: LMA  Additional Equipment:   Intra-op Plan:   Post-operative Plan: Extubation in OR  Informed Consent: I have reviewed the patients History and Physical, chart, labs and discussed the procedure including the risks, benefits and alternatives for the proposed anesthesia with the patient or authorized representative who has indicated his/her understanding and acceptance.   Dental Advisory Given  Plan Discussed with: Anesthesiologist, CRNA and Surgeon  Anesthesia Plan  Comments: (Patient sexually active, with IUD and serum HCG is low but not negative.  Periods stopped with IUD placement.  Patient on dialysis and makes little urine.  Case discussed with OBGYN Dr. Glennon Mac who said that confirmatory test would be to recheck in a couple of days.  Patient given option to proceed with possible risks to fetus but she elected to reschedule.  Patient to follow up with PCP.   Patient consented for risks of anesthesia including but not limited to:  - adverse reactions to medications - damage to teeth, lips or other oral mucosa - sore throat or hoarseness - Damage to heart, brain, lungs or loss of life  Patient voiced understanding.)      Anesthesia Quick Evaluation

## 2017-05-26 NOTE — H&P (Signed)
Reviewed paper H+P, will be scanned into chart. No changes noted.  

## 2017-05-26 NOTE — OR Nursing (Signed)
Procedure cancelled by anesthesia related to elevated HCG levels.  Patient to follow up with health care provider and call Dr Rudene Christians office for reschedule

## 2017-05-26 NOTE — Anesthesia Post-op Follow-up Note (Signed)
Anesthesia QCDR form completed.        

## 2017-05-29 ENCOUNTER — Encounter: Payer: Self-pay | Admitting: Orthopedic Surgery

## 2017-05-31 ENCOUNTER — Other Ambulatory Visit (INDEPENDENT_AMBULATORY_CARE_PROVIDER_SITE_OTHER): Payer: Self-pay | Admitting: Vascular Surgery

## 2017-05-31 ENCOUNTER — Ambulatory Visit (INDEPENDENT_AMBULATORY_CARE_PROVIDER_SITE_OTHER): Payer: Medicare Other

## 2017-05-31 ENCOUNTER — Ambulatory Visit (INDEPENDENT_AMBULATORY_CARE_PROVIDER_SITE_OTHER): Payer: Medicare Other | Admitting: Vascular Surgery

## 2017-05-31 ENCOUNTER — Encounter (INDEPENDENT_AMBULATORY_CARE_PROVIDER_SITE_OTHER): Payer: Self-pay | Admitting: Vascular Surgery

## 2017-05-31 VITALS — BP 125/78 | HR 87 | Resp 17 | Ht 64.0 in | Wt 230.0 lb

## 2017-05-31 DIAGNOSIS — N186 End stage renal disease: Secondary | ICD-10-CM

## 2017-05-31 DIAGNOSIS — T829XXS Unspecified complication of cardiac and vascular prosthetic device, implant and graft, sequela: Secondary | ICD-10-CM

## 2017-05-31 NOTE — Progress Notes (Signed)
Subjective:    Patient ID: [name], female    DOB: 1985-03-20, 32 y.o.   MRN: [id number] Chief Complaint  Patient presents with  . Follow-up    [facility] 3week vein mapp   Patient presents to review vascular studies.  The patient is currently being maintained by a PermCath without issue as the left upper extremity graft placed at [facility] is nonfunctional / thrombosed.  The patient underwent a left upper extremity shuntogram on May 10, 2017 at [facility] as an attempt to restore function to her graft however this was not successful.  The patient presents today and underwent a bilateral upper extremity vein mapping in preparation of creating a new access.  The patient's right cephalic vein is occluded/used for previous access.  The left cephalic vein is occluded/used for previous access.  The left basilic vein is too small to create a brachial basilic transposition.  Right basilic veins are adequate to create a brachial basilic transposition.  Possible left ulnar artery occlusion with the left brachial and left radial arteries appearing patent.  The patient denies any uremic symptoms.  The patient denies any fever, nausea or vomiting.  Review of Systems  Constitutional: Negative.   HENT: Negative.   Eyes: Negative.   Respiratory: Negative.   Cardiovascular: Negative.   Gastrointestinal: Negative.   Endocrine: Negative.   Genitourinary:       ESRD  Musculoskeletal: Negative.   Skin: Negative.   Allergic/Immunologic: Negative.   Neurological: Negative.   Hematological: Negative.   Psychiatric/Behavioral: Negative.       Objective:   Physical Exam  Constitutional: She is oriented to person, place, and time. She appears well-developed and well-nourished. No distress.  HENT:  Head: Normocephalic and atraumatic.  Right Ear: External ear normal.  Left Ear: External ear normal.  Eyes: Pupils are equal, round, and reactive to light. Conjunctivae and EOM are normal.  Neck: Normal range of  motion.  Cardiovascular: Normal rate, regular rhythm, normal heart sounds and intact distal pulses.  Pulses:      Radial pulses are 2+ on the right side, and 2+ on the left side.  Left upper extremity graft: No bruit or thrill noted.  Skin is intact.  Pulmonary/Chest: Effort normal and breath sounds normal.  Musculoskeletal: Normal range of motion. She exhibits no edema.  Neurological: She is alert and oriented to person, place, and time.  Skin: She is not diaphoretic.  Psychiatric: She has a normal mood and affect. Her behavior is normal. Judgment and thought content normal.  Vitals reviewed.  BP 125/78 (BP Location: Right Arm)   Pulse 87   Resp 17   Ht 5\' 4"  (1.626 m)   Wt 230 lb (104.3 kg)   LMP  (LMP Unknown)   BMI 39.48 kg/m   Past Medical History:  Diagnosis Date  . Anemia   . Asthma   . Diabetes mellitus   . Dysrhythmia   . 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 GERD (gastroesophageal reflux disease)   . Hypertension   . Irregular heart beat   . Morbid obesity ([place])   . Renal insufficiency   . Seizures ([place])   . Sleep apnea    Social History   Socioeconomic History  . Marital status: Single    Spouse name: Not on file  . Number of children: Not on file  . Years of education: Not on file  . Highest education level: Not on file  Occupational History  .  Not on file  Social Needs  . Financial resource strain: Not on file  . Food insecurity:    Worry: Not on file    Inability: Not on file  . Transportation needs:    Medical: Not on file    Non-medical: Not on file  Tobacco Use  . Smoking status: Light Tobacco Smoker    Packs/day: 0.25    Years: 10.00    Pack years: 2.50    Types: Cigarettes  . Smokeless tobacco: Never Used  Substance and Sexual Activity  . Alcohol use: No  . Drug use: No  . Sexual activity: Yes    Birth control/protection: IUD  Lifestyle  . Physical activity:    Days per week: Not on file    Minutes per session: Not on  file  . Stress: Not on file  Relationships  . Social connections:    Talks on phone: Not on file    Gets together: Not on file    Attends religious service: Not on file    Active member of club or organization: Not on file    Attends meetings of clubs or organizations: Not on file    Relationship status: Not on file  . Intimate partner violence:    Fear of current or ex partner: Not on file    Emotionally abused: Not on file    Physically abused: Not on file    Forced sexual activity: Not on file  Other Topics Concern  . Not on file  Social History Narrative  . Not on file   Past Surgical History:  Procedure Laterality Date  . AV FISTULA PLACEMENT  08/26/10   Left Radiocephalic AVF  . AV FISTULA PLACEMENT  08/26/2015   Procedure: ARTERIOVENOUS (AV) FISTULA CREATION;  Surgeon: Algernon Huxley, MD;  Location: [facility] OR[facility];  Service: Vascular;;  . BACK SURGERY    . CARPAL TUNNEL RELEASE Left 05/26/2017   Procedure: CARPAL TUNNEL RELEASE;  Surgeon: Hessie Knows, MD;  Location: [facility] ORS;  Service: Orthopedics;  Laterality: Left;  . DIALYSIS/PERMA CATHETER REMOVAL N/A 03/07/2016   Procedure: Dialysis/Perma Catheter Removal;  Surgeon: Algernon Huxley, MD;  Location: [place] [facility];  Service: Cardiovascular;  Laterality: N/A;  . EYE SURGERY     laser photocoagulation  . EYE SURGERY Right 2008   removal of blood clot  . INTRAUTERINE DEVICE (IUD) INSERTION    . PERIPHERAL VASCULAR CATHETERIZATION N/A 07/07/2014   Procedure: A/V Shuntogram/Fistulagram;  Surgeon: Algernon Huxley, MD;  Location: [place] [facility];  Service: Cardiovascular;  Laterality: N/A;  . PERIPHERAL VASCULAR CATHETERIZATION N/A 07/07/2014   Procedure: A/V Shunt Intervention;  Surgeon: Algernon Huxley, MD;  Location: [place] [facility];  Service: Cardiovascular;  Laterality: N/A;  . PERIPHERAL VASCULAR CATHETERIZATION Right 01/12/2015   Procedure: A/V Shuntogram/Fistulagram;  Surgeon: Algernon Huxley, MD;  Location: [place] [facility];   Service: Cardiovascular;  Laterality: Right;  . PERIPHERAL VASCULAR CATHETERIZATION N/A 01/12/2015   Procedure: A/V Shunt Intervention;  Surgeon: Algernon Huxley, MD;  Location: [place] [facility];  Service: Cardiovascular;  Laterality: N/A;  . PERIPHERAL VASCULAR CATHETERIZATION Right 06/08/2015   Procedure: A/V Shuntogram/Fistulagram;  Surgeon: Algernon Huxley, MD;  Location: [place] [facility];  Service: Cardiovascular;  Laterality: Right;  . PERIPHERAL VASCULAR CATHETERIZATION N/A 06/08/2015   Procedure: A/V Shunt Intervention;  Surgeon: Algernon Huxley, MD;  Location: [place] [facility];  Service: Cardiovascular;  Laterality: N/A;  . PERIPHERAL VASCULAR CATHETERIZATION  Right 07/14/2015   Procedure: Thrombectomy;  Surgeon: Katha Cabal, MD;  Location: [place] [facility];  Service: Cardiovascular;  Laterality: Right;  . PERIPHERAL VASCULAR CATHETERIZATION N/A 07/14/2015   Procedure: A/V Shuntogram/Fistulagram;  Surgeon: Katha Cabal, MD;  Location: [place] [facility];  Service: Cardiovascular;  Laterality: N/A;  . PERIPHERAL VASCULAR CATHETERIZATION N/A 07/17/2015   Procedure: Thrombectomy;  Surgeon: Katha Cabal, MD;  Location: [place] [facility];  Service: Cardiovascular;  Laterality: N/A;  . PERIPHERAL VASCULAR CATHETERIZATION N/A 07/17/2015   Procedure: Dialysis/Perma Catheter Insertion;  Surgeon: Katha Cabal, MD;  Location: [place] [facility];  Service: Cardiovascular;  Laterality: N/A;  . PERIPHERAL VASCULAR CATHETERIZATION N/A 08/05/2015   Procedure: Thrombectomy;  Surgeon: Katha Cabal, MD;  Location: [place] [facility];  Service: Cardiovascular;  Laterality: N/A;  declot of graft  . PERIPHERAL VASCULAR THROMBECTOMY N/A 05/10/2017   Procedure: PERIPHERAL VASCULAR THROMBECTOMY;  Surgeon: Algernon Huxley, MD;  Location: [place] [facility];  Service: Cardiovascular;  Laterality: N/A;  . TOE AMPUTATION     both big toes removed   Family History  Problem Relation Age of  Onset  . Diabetes Mother   . Heart attack Mother   . Stroke Mother   . High Cholesterol Mother   . Diabetes Father    Allergies  Allergen Reactions  . Marcillin [Ampicillin] Rash and Other (See Comments)    Breathing and heart palp  Hot and sweating Has patient had a PCN reaction causing immediate rash, facial/tongue/throat swelling, SOB or lightheadedness with hypotension: Yes Has patient had a PCN reaction causing severe rash involving mucus membranes or skin necrosis: No Has patient had a PCN reaction that required hospitalization Unknown Has patient had a PCN reaction occurring within the last 10 years: Yes If all of the above answers are "NO", then may proceed with Cephalosporin use.   [name] Kitchen Penicillins Other (See Comments)    Heart races  . Latex Swelling and Rash  . Vancomycin Itching, Nausea Only and Rash    Thick pieces of skin pulls off.      Assessment & Plan:  Patient presents to review vascular studies.  The patient is currently being maintained by a PermCath without issue as the left upper extremity graft placed at [facility] is nonfunctional / thrombosed.  The patient underwent a left upper extremity shuntogram on May 10, 2017 at [facility] as an attempt to restore function to her graft however this was not successful.  The patient presents today and underwent a bilateral upper extremity vein mapping in preparation of creating a new access.  The patient's right cephalic vein is occluded/used for previous access.  The left cephalic vein is occluded/used for previous access.  The left basilic vein is too small to create a brachial basilic transposition.  Right basilic veins are adequate to create a brachial basilic transposition.  Possible left ulnar artery occlusion with the left brachial and left radial arteries appearing patent.  The patient denies any uremic symptoms.  The patient denies any fever, nausea or vomiting.  1. End stage renal disease ([place]) - Stable The recent graft that was  created at [facility] is now thrombosed. The patient is currently being maintained by a PermCath.  We had a long discussion about a PermCath not being a long-term option as prolonged use will increase the patient's mortality. Recommend creation of a right brachiobasilic transposition.  The patient understands that this will be a 2 part procedure.  And  will take approximately 2 months to mature Procedure, risks and benefits explained to the patient.  All questions answered.  The patient wishes to proceed.  2. Complication from renal dialysis device, sequela - Stable As above  Current Outpatient Medications on File Prior to Visit  Medication Sig Dispense Refill  . aspirin 81 MG tablet Take 81 mg by mouth daily.      . cinacalcet (SENSIPAR) 30 MG tablet Take 30 mg by mouth See admin instructions. Given at dialysis Tue Thu Sat    . darbepoetin (ARANESP, ALB FREE, SURECLICK) 295 JOA/4.1YS SOLN Inject 200 mcg into the skin every 7 (seven) days. Reported on 07/14/2015    . fentaNYL (DURAGESIC - DOSED MCG/HR) 12 MCG/HR Place 12.5 mcg onto the skin every 3 (three) days.    [name] Kitchen FOSRENOL 1000 MG PACK Take 1 packet by mouth 3 (three) times daily. Mix 1 packet with applesauce or similar food. Eat Immediately 3 times daily with meals  6  . insulin glargine (LANTUS) 100 UNIT/ML injection Inject 6 Units into the skin daily as needed (high blood sugar). If blood sugar is over 140    . omeprazole (PRILOSEC) 20 MG capsule Take 20 mg by mouth daily.    [name] Kitchen OVER THE COUNTER MEDICATION Take 5 mLs by mouth daily. nutra burst multivitamin liquid    . oxyCODONE-acetaminophen (ROXICET) 5-325 MG tablet Take 1-2 tablets by mouth every 6 (six) hours as needed. (Patient taking differently: Take 1-2 tablets by mouth 2 (two) times daily as needed for moderate pain. ) 10 tablet 0  . pravastatin (PRAVACHOL) 40 MG tablet Take 40 mg by mouth at bedtime.     . promethazine (PHENERGAN) 12.5 MG tablet Take 12.5 mg by mouth every 6 (six) hours as  needed for nausea or vomiting.    [name] Kitchen albuterol (PROVENTIL HFA;VENTOLIN HFA) 108 (90 Base) MCG/ACT inhaler Inhale 2 puffs into the lungs 2 (two) times daily as needed for wheezing or shortness of breath.      No current facility-administered medications on file prior to visit.    There are no Patient Instructions on file for this visit. No follow-ups on file.  Remijio Holleran A Alfons Sulkowski, PA-C

## 2017-06-01 ENCOUNTER — Emergency Department
Admission: EM | Admit: 2017-06-01 | Discharge: 2017-06-01 | Disposition: A | Discharge status: Home / Self Care | Payer: Medicare Other | Attending: Emergency Medicine | Admitting: Emergency Medicine

## 2017-06-01 ENCOUNTER — Other Ambulatory Visit: Payer: Self-pay

## 2017-06-01 ENCOUNTER — Emergency Department: Payer: Medicare Other

## 2017-06-01 ENCOUNTER — Encounter: Payer: Self-pay | Admitting: Emergency Medicine

## 2017-06-01 DIAGNOSIS — Z992 Dependence on renal dialysis: Secondary | ICD-10-CM | POA: Diagnosis not present

## 2017-06-01 DIAGNOSIS — Z91040 Latex allergy status: Secondary | ICD-10-CM | POA: Diagnosis not present

## 2017-06-01 DIAGNOSIS — R42 Dizziness and giddiness: Secondary | ICD-10-CM

## 2017-06-01 DIAGNOSIS — R079 Chest pain, unspecified: Secondary | ICD-10-CM | POA: Insufficient documentation

## 2017-06-01 DIAGNOSIS — F17210 Nicotine dependence, cigarettes, uncomplicated: Secondary | ICD-10-CM | POA: Insufficient documentation

## 2017-06-01 DIAGNOSIS — E1122 Type 2 diabetes mellitus with diabetic chronic kidney disease: Secondary | ICD-10-CM | POA: Insufficient documentation

## 2017-06-01 DIAGNOSIS — N186 End stage renal disease: Secondary | ICD-10-CM | POA: Insufficient documentation

## 2017-06-01 DIAGNOSIS — I120 Hypertensive chronic kidney disease with stage 5 chronic kidney disease or end stage renal disease: Secondary | ICD-10-CM | POA: Diagnosis not present

## 2017-06-01 DIAGNOSIS — R0602 Shortness of breath: Secondary | ICD-10-CM | POA: Diagnosis not present

## 2017-06-01 DIAGNOSIS — I959 Hypotension, unspecified: Secondary | ICD-10-CM | POA: Insufficient documentation

## 2017-06-01 DIAGNOSIS — J45909 Unspecified asthma, uncomplicated: Secondary | ICD-10-CM | POA: Insufficient documentation

## 2017-06-01 LAB — BASIC METABOLIC PANEL
Anion gap: 12 (ref 5–15)
BUN: 21 mg/dL — AB (ref 6–20)
CHLORIDE: 95 mmol/L — AB (ref 101–111)
CO2: 29 mmol/L (ref 22–32)
Calcium: 8.2 mg/dL — ABNORMAL LOW (ref 8.9–10.3)
Creatinine, Ser: 4.66 mg/dL — ABNORMAL HIGH (ref 0.44–1.00)
GFR calc Af Amer: 13 mL/min — ABNORMAL LOW (ref >60)
GFR calc non Af Amer: 11 mL/min — ABNORMAL LOW (ref >60)
Glucose, Bld: 107 mg/dL — ABNORMAL HIGH (ref 65–99)
Potassium: 3.6 mmol/L (ref 3.5–5.1)
Sodium: 136 mmol/L (ref 135–145)

## 2017-06-01 LAB — CBC
HCT: 37.9 % (ref 35.0–47.0)
Hemoglobin: 12.8 g/dL (ref 12.0–16.0)
MCH: 29.6 pg (ref 26.0–34.0)
MCHC: 33.8 g/dL (ref 32.0–36.0)
MCV: 87.5 fL (ref 80.0–100.0)
Platelets: 194 10*3/uL (ref 150–440)
RBC: 4.33 MIL/uL (ref 3.80–5.20)
RDW: 18.1 % — ABNORMAL HIGH (ref 11.5–14.5)
WBC: 6.6 10*3/uL (ref 3.6–11.0)

## 2017-06-01 LAB — TROPONIN I: Troponin I: <0.03 ng/mL (ref <0.03)

## 2017-06-01 NOTE — ED Provider Notes (Signed)
[facility] Emergency Department Provider Note       Time seen: ----------------------------------------- 10:53 AM on 06/01/2017 -----------------------------------------   I have reviewed the triage vital signs and the nursing notes.  HISTORY   Chief Complaint Chest Pain    HPI [name] is a 33 y.o. female with a history of anemia, asthma, diabetes, GERD, renal insufficiency on dialysis, seizures and sleep apnea who presents to the ED for shortness of breath and chest pain.  According to [facility] kidney dialysis center her blood pressure dropped into the 60s.  Patient states her blood pressure typically drops at the end of dialysis but she did not normally feel this bad.  She currently denies complaints.  Past Medical History:  Diagnosis Date  . Anemia   . Asthma   . Diabetes mellitus   . Dysrhythmia   . 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 GERD (gastroesophageal reflux disease)   . Hypertension   . Irregular heart beat   . Morbid obesity ([place])   . Renal insufficiency   . Seizures ([place])   . Sleep apnea     Patient Active Problem List   Diagnosis Date Noted  . Hyperkalemia 05/01/2017  . Essential hypertension, benign 02/05/2016  . Diabetes ([place]) 02/05/2016  . Complication from renal dialysis device 07/07/2014  . End stage renal disease ([place]) 12/29/2010    Past Surgical History:  Procedure Laterality Date  . AV FISTULA PLACEMENT  08/26/10   Left Radiocephalic AVF  . AV FISTULA PLACEMENT  08/26/2015   Procedure: ARTERIOVENOUS (AV) FISTULA CREATION;  Surgeon: Algernon Huxley, MD;  Location: [facility] ORS;  Service: Vascular;;  . BACK SURGERY    . CARPAL TUNNEL RELEASE Left 05/26/2017   Procedure: CARPAL TUNNEL RELEASE;  Surgeon: Hessie Knows, MD;  Location: [facility] ORS;  Service: Orthopedics;  Laterality: Left;  . DIALYSIS/PERMA CATHETER REMOVAL N/A 03/07/2016   Procedure: Dialysis/Perma Catheter Removal;  Surgeon: Algernon Huxley, MD;   Location: [place] [facility];  Service: Cardiovascular;  Laterality: N/A;  . EYE SURGERY     laser photocoagulation  . EYE SURGERY Right 2008   removal of blood clot  . INTRAUTERINE DEVICE (IUD) INSERTION    . PERIPHERAL VASCULAR CATHETERIZATION N/A 07/07/2014   Procedure: A/V Shuntogram/Fistulagram;  Surgeon: Algernon Huxley, MD;  Location: [place] [facility];  Service: Cardiovascular;  Laterality: N/A;  . PERIPHERAL VASCULAR CATHETERIZATION N/A 07/07/2014   Procedure: A/V Shunt Intervention;  Surgeon: Algernon Huxley, MD;  Location: [place] [facility];  Service: Cardiovascular;  Laterality: N/A;  . PERIPHERAL VASCULAR CATHETERIZATION Right 01/12/2015   Procedure: A/V Shuntogram/Fistulagram;  Surgeon: Algernon Huxley, MD;  Location: [place] [facility];  Service: Cardiovascular;  Laterality: Right;  . PERIPHERAL VASCULAR CATHETERIZATION N/A 01/12/2015   Procedure: A/V Shunt Intervention;  Surgeon: Algernon Huxley, MD;  Location: [place] [facility];  Service: Cardiovascular;  Laterality: N/A;  . PERIPHERAL VASCULAR CATHETERIZATION Right 06/08/2015   Procedure: A/V Shuntogram/Fistulagram;  Surgeon: Algernon Huxley, MD;  Location: [place] [facility];  Service: Cardiovascular;  Laterality: Right;  . PERIPHERAL VASCULAR CATHETERIZATION N/A 06/08/2015   Procedure: A/V Shunt Intervention;  Surgeon: Algernon Huxley, MD;  Location: [place] [facility];  Service: Cardiovascular;  Laterality: N/A;  . PERIPHERAL VASCULAR CATHETERIZATION Right 07/14/2015   Procedure: Thrombectomy;  Surgeon: Katha Cabal, MD;  Location: [place] [facility];  Service: Cardiovascular;  Laterality: Right;  . PERIPHERAL VASCULAR CATHETERIZATION  N/A 07/14/2015   Procedure: A/V Shuntogram/Fistulagram;  Surgeon: Katha Cabal, MD;  Location: [place] [facility];  Service: Cardiovascular;  Laterality: N/A;  . PERIPHERAL VASCULAR CATHETERIZATION N/A 07/17/2015   Procedure: Thrombectomy;  Surgeon: Katha Cabal, MD;  Location: [place]  CV LAB;  Service: Cardiovascular;  Laterality: N/A;  . PERIPHERAL VASCULAR CATHETERIZATION N/A 07/17/2015   Procedure: Dialysis/Perma Catheter Insertion;  Surgeon: Katha Cabal, MD;  Location: [place] [facility] LAB;  Service: Cardiovascular;  Laterality: N/A;  . PERIPHERAL VASCULAR CATHETERIZATION N/A 08/05/2015   Procedure: Thrombectomy;  Surgeon: Katha Cabal, MD;  Location: [place] [facility];  Service: Cardiovascular;  Laterality: N/A;  declot of graft  . PERIPHERAL VASCULAR THROMBECTOMY N/A 05/10/2017   Procedure: PERIPHERAL VASCULAR THROMBECTOMY;  Surgeon: Algernon Huxley, MD;  Location: [place] [facility];  Service: Cardiovascular;  Laterality: N/A;  . TOE AMPUTATION     both big toes removed    Allergies Marcillin [ampicillin]; Penicillins; Latex; and Vancomycin  Social History Social History   Tobacco Use  . Smoking status: Light Tobacco Smoker    Packs/day: 0.25    Years: 10.00    Pack years: 2.50    Types: Cigarettes  . Smokeless tobacco: Never Used  Substance Use Topics  . Alcohol use: No  . Drug use: No   Review of Systems Constitutional: Negative for fever. Cardiovascular: Positive for chest pain Respiratory: Positive for difficulty breathing Gastrointestinal: Negative for abdominal pain, vomiting and diarrhea. Musculoskeletal: Negative for back pain. Skin: Negative for rash. Neurological: Negative for headaches, focal weakness or numbness.  All systems negative/normal/unremarkable except as stated in the HPI  ____________________________________________   PHYSICAL EXAM:  VITAL SIGNS: ED Triage Vitals  Enc Vitals Group     BP 06/01/17 1043 95/68     Pulse Rate 06/01/17 1041 96     Resp 06/01/17 1041 16     Temp --      Temp src --      SpO2 06/01/17 1041 (!) 89 %     Weight 06/01/17 1041 225 lb (102.1 kg)     Height 06/01/17 1041 5\' 4"  (1.626 m)     Head Circumference --      Peak Flow --      Pain Score 06/01/17 1041 0     Pain Loc --       Pain Edu? --      Excl. in [place]? --    Constitutional: Alert and oriented. Well appearing and in no distress. Eyes: Conjunctivae are normal. Normal extraocular movements. ENT   Head: Normocephalic and atraumatic.   Nose: No congestion/rhinnorhea.   Mouth/Throat: Mucous membranes are moist.   Neck: No stridor. Cardiovascular: Normal rate, regular rhythm. No murmurs, rubs, or gallops. Respiratory: Normal respiratory effort without tachypnea nor retractions. Breath sounds are clear and equal bilaterally. No wheezes/rales/rhonchi. Gastrointestinal: Soft and nontender. Normal bowel sounds Musculoskeletal: Previous right great toe amputation, there is a healing ulceration on the plantar surface of the right foot Neurologic:  Normal speech and language. No gross focal neurologic deficits are appreciated.  Skin:  Skin is warm, dry and intact. No rash noted. Psychiatric: Mood and affect are normal. Speech and behavior are normal.  ____________________________________________  EKG: Interpreted by me.  Sinus rhythm the rate of 95 bpm, normal PR interval, normal QRS, normal QT.  ____________________________________________  ED COURSE:  As part of my medical decision making, I reviewed the following data within the [place]  History obtained from family if available, nursing notes, old chart and ekg, as well as notes from prior ED visits. Patient presented for shortness of breath and chest pain which has resolved, we will assess with labs and imaging as indicated at this time.   Procedures ____________________________________________   LABS (pertinent positives/negatives)  Labs Reviewed  BASIC METABOLIC PANEL - Abnormal; Notable for the following components:      Result Value   Chloride 95 (*)    Glucose, Bld 107 (*)    BUN 21 (*)    Creatinine, Ser 4.66 (*)    Calcium 8.2 (*)    GFR calc non Af Amer 11 (*)    GFR calc Af Amer 13 (*)    All other components  within normal limits  CBC - Abnormal; Notable for the following components:   RDW 18.1 (*)    All other components within normal limits  TROPONIN I  POC URINE PREG, ED    RADIOLOGY  Chest x-ray  IMPRESSION: No active cardiopulmonary disease. ____________________________________________  DIFFERENTIAL DIAGNOSIS   Vasovagal event, dehydration, electrolyte abnormality, arrhythmia, occult infection  FINAL ASSESSMENT AND PLAN  Chest pain, shortness of breath   Plan: The patient had presented for temporary chest pain, shortness of breath and hypotension. Patient's labs are reassuring here. Patient's imaging was also negative.  This appears to be a transient vasovagal type of event of uncertain etiology.  Patient states she has had issues with a certain medication during dialysis in the past.  She is cleared for outpatient follow-up at this time.   Laurence Aly, MD   Note: This note was generated in part or whole with voice recognition software. Voice recognition is usually quite accurate but there are transcription errors that can and very often do occur. I apologize for any typographical errors that were not detected and corrected.     Earleen Newport, MD 06/01/17 1346

## 2017-06-01 NOTE — ED Notes (Signed)
Pt given snack of graham crackers, apple sauce, saltines, and peanut butter. Pt is visualized in NAD. VSS. Pt currently on RA at this time, 100% O2.

## 2017-06-01 NOTE — ED Notes (Signed)
Pt c/o sudden onset CP and SHOB after dialysis. Pt states felt like her heart was racing during dialysis, initially hypotensive at dialysis. Pt states at this time she feels better. NSR on the monitor, A&O x4, respirations even and unlabored, skin warm, dry and intact.

## 2017-06-01 NOTE — ED Notes (Addendum)
Pt sitting up on side of bed, awaiting her mother's arrival to D/C patient. Pt is alert and oriented at this time. Will continue to monitor for further patient needs.

## 2017-06-01 NOTE — ED Triage Notes (Signed)
PT to ED via EMS from [facility]. PT completed dialysis and became SOB and had CP. Per facility pt BP 63/27, EMS states BP was 97/63. PT appears fatigue. A&OX4,

## 2017-06-02 ENCOUNTER — Encounter (INDEPENDENT_AMBULATORY_CARE_PROVIDER_SITE_OTHER): Payer: Self-pay

## 2017-06-06 NOTE — Pre-Procedure Instructions (Signed)
Met B results sent to Dr. Menz and Anesthesia for review. 

## 2017-06-08 MED ORDER — CLINDAMYCIN PHOSPHATE 300 MG/50ML IV SOLN
300.0000 mg | Freq: Once | INTRAVENOUS | Status: AC
  Administered 2017-06-09: 300 mg via INTRAVENOUS

## 2017-06-09 ENCOUNTER — Encounter: Payer: Self-pay | Admitting: *Deleted

## 2017-06-09 ENCOUNTER — Encounter
Admission: RE | Disposition: A | Discharge status: Home / Self Care | Payer: Self-pay | Source: Ambulatory Visit | Attending: Orthopedic Surgery

## 2017-06-09 ENCOUNTER — Ambulatory Visit
Admission: RE | Admit: 2017-06-09 | Discharge: 2017-06-09 | Disposition: A | Discharge status: Home / Self Care | Payer: Medicare Other | Source: Ambulatory Visit | Attending: Orthopedic Surgery | Admitting: Orthopedic Surgery

## 2017-06-09 ENCOUNTER — Ambulatory Visit: Payer: Medicare Other | Admitting: Registered Nurse

## 2017-06-09 ENCOUNTER — Other Ambulatory Visit: Payer: Self-pay

## 2017-06-09 DIAGNOSIS — N183 Chronic kidney disease, stage 3 (moderate): Secondary | ICD-10-CM | POA: Diagnosis not present

## 2017-06-09 DIAGNOSIS — Z79899 Other long term (current) drug therapy: Secondary | ICD-10-CM | POA: Diagnosis not present

## 2017-06-09 DIAGNOSIS — Z6839 Body mass index (BMI) 39.0-39.9, adult: Secondary | ICD-10-CM | POA: Insufficient documentation

## 2017-06-09 DIAGNOSIS — G5602 Carpal tunnel syndrome, left upper limb: Secondary | ICD-10-CM | POA: Insufficient documentation

## 2017-06-09 DIAGNOSIS — E1122 Type 2 diabetes mellitus with diabetic chronic kidney disease: Secondary | ICD-10-CM | POA: Insufficient documentation

## 2017-06-09 DIAGNOSIS — G4730 Sleep apnea, unspecified: Secondary | ICD-10-CM | POA: Diagnosis not present

## 2017-06-09 DIAGNOSIS — F17200 Nicotine dependence, unspecified, uncomplicated: Secondary | ICD-10-CM | POA: Insufficient documentation

## 2017-06-09 DIAGNOSIS — E785 Hyperlipidemia, unspecified: Secondary | ICD-10-CM | POA: Insufficient documentation

## 2017-06-09 DIAGNOSIS — I129 Hypertensive chronic kidney disease with stage 1 through stage 4 chronic kidney disease, or unspecified chronic kidney disease: Secondary | ICD-10-CM | POA: Insufficient documentation

## 2017-06-09 DIAGNOSIS — Z794 Long term (current) use of insulin: Secondary | ICD-10-CM | POA: Diagnosis not present

## 2017-06-09 HISTORY — PX: CARPAL TUNNEL RELEASE: SHX101

## 2017-06-09 LAB — HCG, QUANTITATIVE, PREGNANCY: HCG, BETA CHAIN, QUANT, S: 5 m[IU]/mL — AB (ref <5)

## 2017-06-09 LAB — POCT I-STAT 4, (NA,K, GLUC, HGB,HCT)
GLUCOSE: 101 mg/dL — AB (ref 65–99)
HEMATOCRIT: 41 % (ref 36.0–46.0)
Hemoglobin: 13.9 g/dL (ref 12.0–15.0)
POTASSIUM: 4.8 mmol/L (ref 3.5–5.1)
SODIUM: 138 mmol/L (ref 135–145)

## 2017-06-09 LAB — GLUCOSE, CAPILLARY: GLUCOSE-CAPILLARY: 92 mg/dL (ref 65–99)

## 2017-06-09 SURGERY — CARPAL TUNNEL RELEASE
Anesthesia: General | Laterality: Left

## 2017-06-09 MED ORDER — PHENYLEPHRINE HCL 10 MG/ML IJ SOLN
INTRAMUSCULAR | Status: DC | PRN
  Administered 2017-06-09 (×3): 100 ug via INTRAVENOUS

## 2017-06-09 MED ORDER — EPHEDRINE SULFATE 50 MG/ML IJ SOLN
INTRAMUSCULAR | Status: DC | PRN
  Administered 2017-06-09: 5 mg via INTRAVENOUS

## 2017-06-09 MED ORDER — SODIUM CHLORIDE 0.9 % IV SOLN
INTRAVENOUS | Status: DC
  Administered 2017-06-09: 11:00:00 via INTRAVENOUS

## 2017-06-09 MED ORDER — OXYCODONE HCL 5 MG PO TABS: 5.0000 mg | ORAL_TABLET | Freq: Once | ORAL | Status: DC | PRN

## 2017-06-09 MED ORDER — DEXMEDETOMIDINE HCL IN NACL 80 MCG/20ML IV SOLN
INTRAVENOUS | Status: AC
  Filled 2017-06-09: qty 20

## 2017-06-09 MED ORDER — FAMOTIDINE 20 MG PO TABS
20.0000 mg | ORAL_TABLET | Freq: Once | ORAL | Status: AC
  Administered 2017-06-09: 20 mg via ORAL

## 2017-06-09 MED ORDER — BUPIVACAINE HCL 0.5 % IJ SOLN
INTRAMUSCULAR | Status: DC | PRN
  Administered 2017-06-09: 10 mL

## 2017-06-09 MED ORDER — ALBUTEROL SULFATE HFA 108 (90 BASE) MCG/ACT IN AERS
INHALATION_SPRAY | RESPIRATORY_TRACT | Status: DC | PRN
  Administered 2017-06-09: 4 via RESPIRATORY_TRACT

## 2017-06-09 MED ORDER — ONDANSETRON HCL 4 MG PO TABS: 4.0000 mg | ORAL_TABLET | Freq: Four times a day (QID) | ORAL | Status: DC | PRN

## 2017-06-09 MED ORDER — PROPOFOL 10 MG/ML IV BOLUS
INTRAVENOUS | Status: DC | PRN
  Administered 2017-06-09: 200 mg via INTRAVENOUS

## 2017-06-09 MED ORDER — FENTANYL CITRATE (PF) 100 MCG/2ML IJ SOLN
INTRAMUSCULAR | Status: AC
  Filled 2017-06-09: qty 2

## 2017-06-09 MED ORDER — FENTANYL CITRATE (PF) 100 MCG/2ML IJ SOLN: 25.0000 ug | INTRAMUSCULAR | Status: DC | PRN

## 2017-06-09 MED ORDER — ONDANSETRON HCL 4 MG/2ML IJ SOLN
INTRAMUSCULAR | Status: AC
  Filled 2017-06-09: qty 2

## 2017-06-09 MED ORDER — ONDANSETRON HCL 4 MG/2ML IJ SOLN
INTRAMUSCULAR | Status: DC | PRN
  Administered 2017-06-09: 4 mg via INTRAVENOUS

## 2017-06-09 MED ORDER — METOCLOPRAMIDE HCL 10 MG PO TABS: 5.0000 mg | ORAL_TABLET | Freq: Three times a day (TID) | ORAL | Status: DC | PRN

## 2017-06-09 MED ORDER — ONDANSETRON HCL 4 MG/2ML IJ SOLN: 4.0000 mg | Freq: Four times a day (QID) | INTRAMUSCULAR | Status: DC | PRN

## 2017-06-09 MED ORDER — DEXMEDETOMIDINE HCL 200 MCG/2ML IV SOLN
INTRAVENOUS | Status: DC | PRN
  Administered 2017-06-09: 8 ug via INTRAVENOUS

## 2017-06-09 MED ORDER — MIDAZOLAM HCL 2 MG/2ML IJ SOLN
INTRAMUSCULAR | Status: AC
  Filled 2017-06-09: qty 2

## 2017-06-09 MED ORDER — OXYCODONE HCL 5 MG/5ML PO SOLN: 5.0000 mg | Freq: Once | ORAL | Status: DC | PRN

## 2017-06-09 MED ORDER — BUPIVACAINE HCL (PF) 0.5 % IJ SOLN
INTRAMUSCULAR | Status: AC
  Filled 2017-06-09: qty 30

## 2017-06-09 MED ORDER — FAMOTIDINE 20 MG PO TABS
ORAL_TABLET | ORAL | Status: AC
  Administered 2017-06-09: 20 mg via ORAL
  Filled 2017-06-09: qty 1

## 2017-06-09 MED ORDER — FENTANYL CITRATE (PF) 100 MCG/2ML IJ SOLN
INTRAMUSCULAR | Status: DC | PRN
  Administered 2017-06-09 (×2): 50 ug via INTRAVENOUS

## 2017-06-09 MED ORDER — CLINDAMYCIN PHOSPHATE 300 MG/50ML IV SOLN
INTRAVENOUS | Status: AC
  Filled 2017-06-09: qty 50

## 2017-06-09 MED ORDER — SODIUM CHLORIDE 0.9 % IV SOLN: INTRAVENOUS | Status: DC

## 2017-06-09 MED ORDER — HYDROCODONE-ACETAMINOPHEN 5-325 MG PO TABS: 1.0000 | ORAL_TABLET | ORAL | Status: DC | PRN

## 2017-06-09 MED ORDER — LIDOCAINE HCL (CARDIAC) PF 100 MG/5ML IV SOSY
PREFILLED_SYRINGE | INTRAVENOUS | Status: DC | PRN
  Administered 2017-06-09: 60 mg via INTRAVENOUS

## 2017-06-09 MED ORDER — METOCLOPRAMIDE HCL 5 MG/ML IJ SOLN: 5.0000 mg | Freq: Three times a day (TID) | INTRAMUSCULAR | Status: DC | PRN

## 2017-06-09 MED ORDER — PROMETHAZINE HCL 25 MG/ML IJ SOLN: 6.2500 mg | INTRAMUSCULAR | Status: DC | PRN

## 2017-06-09 SURGICAL SUPPLY — 23 items
BANDAGE ACE 3X5.8 VEL STRL LF (GAUZE/BANDAGES/DRESSINGS) ×3 IMPLANT
CANISTER SUCT 1200ML W/VALVE (MISCELLANEOUS) ×3 IMPLANT
CHLORAPREP W/TINT 26ML (MISCELLANEOUS) ×3 IMPLANT
CUFF TOURN 18 STER (MISCELLANEOUS) IMPLANT
ELECT CAUTERY NDL 2.0 MIC (NEEDLE) IMPLANT
ELECT CAUTERY NEEDLE 2.0 MIC (NEEDLE) IMPLANT
GAUZE PETRO XEROFOAM 1X8 (MISCELLANEOUS) ×3 IMPLANT
GAUZE SPONGE 4X4 12PLY STRL (GAUZE/BANDAGES/DRESSINGS) ×3 IMPLANT
GLOVE SURG SYN 9.0  PF PI (GLOVE) ×2
GLOVE SURG SYN 9.0 PF PI (GLOVE) ×1 IMPLANT
GOWN SRG 2XL LVL 4 RGLN SLV (GOWNS) ×1 IMPLANT
GOWN STRL NON-REIN 2XL LVL4 (GOWNS) ×2
GOWN STRL REUS W/ TWL LRG LVL3 (GOWN DISPOSABLE) ×1 IMPLANT
GOWN STRL REUS W/TWL LRG LVL3 (GOWN DISPOSABLE) ×2
KIT TURNOVER KIT A (KITS) ×3 IMPLANT
NS IRRIG 500ML POUR BTL (IV SOLUTION) ×3 IMPLANT
PACK EXTREMITY ARMC (MISCELLANEOUS) ×3 IMPLANT
PAD CAST CTTN 4X4 STRL (SOFTGOODS) ×1 IMPLANT
PADDING CAST COTTON 4X4 STRL (SOFTGOODS) ×2
SCALPEL PROTECTED #15 DISP (BLADE) ×6 IMPLANT
SUT ETHILON 4-0 (SUTURE) ×2
SUT ETHILON 4-0 FS2 18XMFL BLK (SUTURE) ×1
SUTURE ETHLN 4-0 FS2 18XMF BLK (SUTURE) ×1 IMPLANT

## 2017-06-09 NOTE — Discharge Instructions (Signed)
Loosen Ace wrap prior to discharge and the fingers well over the weekend.  Work on finger motion is much as possible.  Keep arm elevated as much as possible.

## 2017-06-09 NOTE — Anesthesia Post-op Follow-up Note (Signed)
Anesthesia QCDR form completed.        

## 2017-06-09 NOTE — Op Note (Signed)
06/09/2017  12:51 PM  PATIENT:  [name]  32 y.o. female  PRE-OPERATIVE DIAGNOSIS:  LEFT CARPAL TUNNEL SYNDROME  POST-OPERATIVE DIAGNOSIS:  LEFT CARPAL TUNNEL SYNDROME  PROCEDURE:  Procedure(s): CARPAL TUNNEL RELEASE (Left)  SURGEON: Laurene Footman, MD  ASSISTANTS: None  ANESTHESIA:   general  EBL:  No intake/output data recorded.  BLOOD ADMINISTERED:none  DRAINS: none   LOCAL MEDICATIONS USED:  MARCAINE     SPECIMEN:  No Specimen  DISPOSITION OF SPECIMEN:  N/A  COUNTS:  YES  TOURNIQUET:  * Missing tourniquet times found for documented tourniquets in log: [id number] *  IMPLANTS: None  DICTATION: .Dragon Dictation patient was brought to the operating room and after adequate anesthesia was obtained the left arm was prepped draped in sterile fashion with a tourniquet applied just below the elbow.  After patient identification and timeout procedures were completed tourniquet was raised.  Approximately 2 cm incision was made in line 3 metacarpal and the skin and subcutaneous tissue were divided to expose the transverse carpal ligament.  There was some thenar musculature overlying this which was elevated towards the thenar side.  The ligament was then opened through a small incision and eventually hemostat placed deep to protect the underlying structures.  Release was carried down distally until fat was noted around the nerve and proximally proximal to the wrist flexion crease proximally 1 cm there is an area of hourglass constriction with blanching of the nerve after release of this there is good vascular blush to the nerve and appeared all compression had been relieved.  The carpal tunnel was noted to have no masses or other abnormality.  The subcutaneous tissue was then infiltrated with 10 cc of half percent Sensorcaine and the wound then closed with interrupted 4-0 nylon skin sutures Xeroform 4 x 4's web roll and Ace wrap applied  PLAN OF CARE: Discharge to home after  PACU  PATIENT DISPOSITION:  PACU - hemodynamically stable.

## 2017-06-09 NOTE — Anesthesia Preprocedure Evaluation (Addendum)
Anesthesia Evaluation  Patient identified by MRN, date of birth, ID band Patient awake    Reviewed: Allergy & Precautions, H&P , NPO status , Patient's Chart, lab work & pertinent test results  History of Anesthesia Complications Negative for: history of anesthetic complications  Airway Mallampati: II  TM Distance: >3 FB Neck ROM: full    Dental  (+) Chipped, Poor Dentition, Dental Advidsory Given   Pulmonary neg shortness of breath, asthma , sleep apnea , neg COPD, neg recent URI, Current Smoker,           Cardiovascular Exercise Tolerance: Good hypertension, (-) angina(-) Past MI and (-) DOE + dysrhythmias + Valvular Problems/Murmurs      Neuro/Psych Seizures -,  negative psych ROS   GI/Hepatic Neg liver ROS, GERD  Medicated and Controlled,  Endo/Other  diabetes, Type 2Morbid obesity  Renal/GU Dialysis and ESRFRenal disease     Musculoskeletal   Abdominal   Peds  Hematology negative hematology ROS (+)   Anesthesia Other Findings Past Medical History: No date: Anemia No date: Asthma No date: Diabetes mellitus No date: Dysrhythmia Jun 17 2015: Gastric bypass status for obesity No date: GERD (gastroesophageal reflux disease) No date: Hypertension No date: Irregular heart beat No date: Morbid obesity ([place]) No date: Renal insufficiency No date: Seizures ([place]) No date: Sleep apnea  Past Surgical History: 08/26/10: AV FISTULA PLACEMENT     Comment:  Left Radiocephalic AVF 0/27/2536: AV FISTULA PLACEMENT     Comment:  Procedure: ARTERIOVENOUS (AV) FISTULA CREATION;                Surgeon: Algernon Huxley, MD;  Location: [facility] ORS;  Service:               Vascular;; No date: BACK SURGERY 03/07/2016: DIALYSIS/PERMA CATHETER REMOVAL; N/A     Comment:  Procedure: Dialysis/Perma Catheter Removal;  Surgeon:               Algernon Huxley, MD;  Location: [place] [facility];                Service: Cardiovascular;  Laterality:  N/A; No date: EYE SURGERY     Comment:  laser photocoagulation 2008: EYE SURGERY; Right     Comment:  removal of blood clot No date: INTRAUTERINE DEVICE (IUD) INSERTION 07/07/2014: PERIPHERAL VASCULAR CATHETERIZATION; N/A     Comment:  Procedure: A/V Shuntogram/Fistulagram;  Surgeon: Algernon Huxley, MD;  Location: [place] [facility];  Service:               Cardiovascular;  Laterality: N/A; 07/07/2014: PERIPHERAL VASCULAR CATHETERIZATION; N/A     Comment:  Procedure: A/V Shunt Intervention;  Surgeon: Algernon Huxley, MD;  Location: [place] [facility];  Service:               Cardiovascular;  Laterality: N/A; 01/12/2015: PERIPHERAL VASCULAR CATHETERIZATION; Right     Comment:  Procedure: A/V Shuntogram/Fistulagram;  Surgeon: Algernon Huxley, MD;  Location: [place] [facility];  Service:               Cardiovascular;  Laterality: Right; 01/12/2015: PERIPHERAL VASCULAR CATHETERIZATION; N/A     Comment:  Procedure: A/V Shunt Intervention;  Surgeon: Erskine Squibb  Lucky Cowboy, MD;  Location: [place] [facility];  Service:               Cardiovascular;  Laterality: N/A; 06/08/2015: PERIPHERAL VASCULAR CATHETERIZATION; Right     Comment:  Procedure: A/V Shuntogram/Fistulagram;  Surgeon: Algernon Huxley, MD;  Location: [place] [facility];  Service:               Cardiovascular;  Laterality: Right; 06/08/2015: PERIPHERAL VASCULAR CATHETERIZATION; N/A     Comment:  Procedure: A/V Shunt Intervention;  Surgeon: Algernon Huxley, MD;  Location: [place] [facility];  Service:               Cardiovascular;  Laterality: N/A; 07/14/2015: PERIPHERAL VASCULAR CATHETERIZATION; Right     Comment:  Procedure: Thrombectomy;  Surgeon: Katha Cabal,               MD;  Location: [place] [facility];  Service:               Cardiovascular;  Laterality: Right; 07/14/2015: PERIPHERAL VASCULAR CATHETERIZATION; N/A     Comment:  Procedure: A/V  Shuntogram/Fistulagram;  Surgeon: Katha Cabal, MD;  Location: [place] [facility];  Service:              Cardiovascular;  Laterality: N/A; 07/17/2015: PERIPHERAL VASCULAR CATHETERIZATION; N/A     Comment:  Procedure: Thrombectomy;  Surgeon: Katha Cabal,               MD;  Location: [place] [facility];  Service:               Cardiovascular;  Laterality: N/A; 07/17/2015: PERIPHERAL VASCULAR CATHETERIZATION; N/A     Comment:  Procedure: Dialysis/Perma Catheter Insertion;  Surgeon:               Katha Cabal, MD;  Location: [place] [facility];                Service: Cardiovascular;  Laterality: N/A; 08/05/2015: PERIPHERAL VASCULAR CATHETERIZATION; N/A     Comment:  Procedure: Thrombectomy;  Surgeon: Katha Cabal,               MD;  Location: [place] [facility];  Service:               Cardiovascular;  Laterality: N/A;  declot of graft 05/10/2017: PERIPHERAL VASCULAR THROMBECTOMY; N/A     Comment:  Procedure: PERIPHERAL VASCULAR THROMBECTOMY;  Surgeon:               Algernon Huxley, MD;  Location: [place] [facility];                Service: Cardiovascular;  Laterality: N/A; No date: TOE AMPUTATION     Comment:  both big toes removed  BMI    Body Mass Index:  40.68 kg/m      Reproductive/Obstetrics negative OB ROS                            Anesthesia Physical  Anesthesia Plan  ASA: IV  Anesthesia Plan: General   Post-op Pain Management:    Induction: Intravenous  PONV Risk Score and Plan: Ondansetron,  Dexamethasone and Midazolam  Airway Management Planned: LMA  Additional Equipment:   Intra-op Plan:   Post-operative Plan: Extubation in OR  Informed Consent: I have reviewed the patients History and Physical, chart, labs and discussed the procedure including the risks, benefits and alternatives for the proposed anesthesia with the patient or authorized representative who has indicated his/her understanding and  acceptance.   Dental Advisory Given  Plan Discussed with: Anesthesiologist, CRNA and Surgeon  Anesthesia Plan Comments: (Patient sexually active, with IUD and serum HCG is low but not negative.  Periods stopped with IUD placement.  Patient on dialysis and makes little urine.  Case discussed with OBGYN Dr. Glennon Mac who said that confirmatory test would be to recheck in a couple of days.  Patient given option to proceed with possible risks to fetus but she elected to reschedule.  Patient to follow up with PCP.   Patient consented for risks of anesthesia including but not limited to:  - adverse reactions to medications - damage to teeth, lips or other oral mucosa - sore throat or hoarseness - Damage to heart, brain, lungs or loss of life  Patient voiced understanding.)       Anesthesia Quick Evaluation

## 2017-06-09 NOTE — Anesthesia Procedure Notes (Signed)
Procedure Name: LMA Insertion Date/Time: 06/09/2017 12:29 PM Performed by: Hedda Slade, CRNA Pre-anesthesia Checklist: Patient identified, Emergency Drugs available, Suction available, Patient being monitored and Timeout performed Patient Re-evaluated:Patient Re-evaluated prior to induction Oxygen Delivery Method: Circle system utilized Preoxygenation: Pre-oxygenation with 100% oxygen Induction Type: IV induction Ventilation: Mask ventilation without difficulty LMA: LMA inserted LMA Size: 4.0 Number of attempts: 1 Airway Equipment and Method: Patient positioned with wedge pillow Dental Injury: Teeth and Oropharynx as per pre-operative assessment

## 2017-06-09 NOTE — Transfer of Care (Signed)
Immediate Anesthesia Transfer of Care Note  Patient: [name]  Procedure(s) Performed: CARPAL TUNNEL RELEASE (Left )  Patient Location: PACU  Anesthesia Type:General  Level of Consciousness: awake, alert  and oriented  Airway & Oxygen Therapy: Patient Spontanous Breathing and Patient connected to face mask oxygen  Post-op Assessment: Report given to RN and Post -op Vital signs reviewed and stable  Post vital signs: Reviewed and stable  Last Vitals:  Vitals Value Taken Time  BP    Temp    Pulse 92 06/09/2017  1:01 PM  Resp 17 06/09/2017  1:01 PM  SpO2 100 % 06/09/2017  1:01 PM  Vitals shown include unvalidated device data.  Last Pain:  Vitals:   06/09/17 1107  TempSrc: Temporal  PainSc: 2          Complications: No apparent anesthesia complications

## 2017-06-09 NOTE — H&P (Signed)
Reviewed paper H+P, will be scanned into chart. No changes noted.  

## 2017-06-10 ENCOUNTER — Encounter: Payer: Self-pay | Admitting: Orthopedic Surgery

## 2017-06-11 NOTE — Anesthesia Postprocedure Evaluation (Signed)
Anesthesia Post Note  Patient: [name]  Procedure(s) Performed: CARPAL TUNNEL RELEASE (Left )  Patient location during evaluation: PACU Anesthesia Type: General Level of consciousness: awake and alert Pain management: pain level controlled Vital Signs Assessment: post-procedure vital signs reviewed and stable Respiratory status: spontaneous breathing, nonlabored ventilation, respiratory function stable and patient connected to nasal cannula oxygen Cardiovascular status: blood pressure returned to baseline and stable Postop Assessment: no apparent nausea or vomiting Anesthetic complications: no     Last Vitals:  Vitals:   06/09/17 1347 06/09/17 1359  BP: (!) 107/58 136/70  Pulse: 96 92  Resp: (!) 22 16  Temp: 36.6 C (!) 36.3 C  SpO2: 98% 100%    Last Pain:  Vitals:   06/09/17 1359  TempSrc: Temporal  PainSc: 0-No pain                 Martha Clan

## 2017-06-12 ENCOUNTER — Other Ambulatory Visit (INDEPENDENT_AMBULATORY_CARE_PROVIDER_SITE_OTHER): Payer: Self-pay | Admitting: Vascular Surgery

## 2017-06-19 ENCOUNTER — Telehealth (INDEPENDENT_AMBULATORY_CARE_PROVIDER_SITE_OTHER): Payer: Self-pay

## 2017-06-19 NOTE — Telephone Encounter (Signed)
No the patient is a dialysis patient and needs a BMP before surgery as the status of her potassium needs to be known.  CBC comes into play and regarding platelet count because if they are below 60 surgery is contraindicated.  There is no need to repeat the EKG.

## 2017-06-19 NOTE — Telephone Encounter (Signed)
[name] called to see if the patient will need to have her CBC, BMP and the EKG done again, as they were done back at the first of this month and the EKG was done on 06-12-17?  She also wants to know if she can just do the type and screen, and PT, and TPT on the day of her scheduled surgery?

## 2017-06-19 NOTE — Telephone Encounter (Signed)
Called back and spoke to [place], and she was gong to relay the message to [place].

## 2017-06-23 ENCOUNTER — Inpatient Hospital Stay
Admission: RE | Admit: 2017-06-23 | Discharge: 2017-06-23 | Disposition: A | Discharge status: Home / Self Care | Payer: Medicare Other | Source: Ambulatory Visit

## 2017-06-23 ENCOUNTER — Other Ambulatory Visit: Payer: Medicare Other

## 2017-06-23 NOTE — Pre-Procedure Instructions (Signed)
Called patient for preop interview and left a message on voicemail. Asked patient to return call today if possible.

## 2017-06-27 MED ORDER — CLINDAMYCIN PHOSPHATE 300 MG/50ML IV SOLN: 300.0000 mg | Freq: Once | INTRAVENOUS | Status: DC

## 2017-06-27 NOTE — Patient Instructions (Signed)
Your procedure is scheduled on: 06/28/17 Report to Day Surgery. [facility]LO[facility] To find out your arrival time please call [phone] between 1PM - 3PM on 06/27/17  Remember: Instructions that are not followed completely may result in serious medical risk, up to and including death, or upon the discretion of your surgeon and anesthesiologist your surgery may need to be rescheduled.     _X__ 1. Do not eat food after midnight the night before your procedure.                 No gum chewing or hard candies. You may drink clear liquids up to 2 hours                 before you are scheduled to arrive for your surgery- DO not drink clear                 liquids within 2 hours of the start of your surgery.                 Clear Liquids include:  water, apple juice without pulp, clear carbohydrate                 drink such as Clearfast of Gartorade, Black Coffee or Tea (Do not add                 anything to coffee or tea).  __X__2.  On the morning of surgery brush your teeth with toothpaste and water, you                 may rinse your mouth with mouthwash if you wish.  Do not swallow any              toothpaste of mouthwash.     _X__ 3.  No Alcohol for 24 hours before or after surgery.   _X__ 4.  Do Not Smoke or use e-cigarettes For 24 Hours Prior to Your Surgery.                 Do not use any chewable tobacco products for at least 6 hours prior to                 surgery.  ____  5.  Bring all medications with you on the day of surgery if instructed.   __X__  6.  Notify your doctor if there is any change in your medical condition      (cold, fever, infections).     Do not wear jewelry, make-up, hairpins, clips or nail polish. Do not wear lotions, powders, or perfumes. You may wear deodorant. Do not shave 48 hours prior to surgery. Men may shave face and neck. Do not bring valuables to the hospital.    [facility] is not responsible for any belongings or  valuables.  Contacts, dentures or bridgework may not be worn into surgery. Leave your suitcase in the car. After surgery it may be brought to your room. For patients admitted to the hospital, discharge time is determined by your treatment team.   Patients discharged the day of surgery will not be allowed to drive home.   _X__ Take these medicines the morning of surgery with A SIP OF WATER:    1.MIDODRINE  2. OMEPRAZOLE AT BEDTIME 06/27/17 AND AM OF SURGERY  3.   4.  5.  6.  ____ Fleet Enema (as directed)   ____ Use CHG Soap as directed  __X__  Use inhalers on the day of surgery    AND BRING AM OF SURGERY  ____ Stop metformin 2 days prior to surgery    _X___ Take 1/2 of usual insulin dose the night before surgery. No insulin the morning          of surgery.  ONLY TAKES INSULIN IF NEEDED NOT TAKEN DAILY  ____ Stop Coumadin/Plavix/aspirin on  ____ Stop Anti-inflammatories on    ____ Stop supplements until after surgery.    ____ Bring C-Pap to the hospital.

## 2017-06-28 ENCOUNTER — Ambulatory Visit
Admission: RE | Admit: 2017-06-28 | Discharge: 2017-06-28 | Disposition: A | Discharge status: Home / Self Care | Payer: Medicare Other | Source: Ambulatory Visit | Attending: Vascular Surgery | Admitting: Vascular Surgery

## 2017-06-28 ENCOUNTER — Encounter
Admission: RE | Disposition: A | Discharge status: Home / Self Care | Payer: Self-pay | Source: Ambulatory Visit | Attending: Vascular Surgery

## 2017-06-28 ENCOUNTER — Ambulatory Visit: Payer: Medicare Other | Admitting: Anesthesiology

## 2017-06-28 DIAGNOSIS — N186 End stage renal disease: Secondary | ICD-10-CM | POA: Insufficient documentation

## 2017-06-28 DIAGNOSIS — F17210 Nicotine dependence, cigarettes, uncomplicated: Secondary | ICD-10-CM | POA: Diagnosis not present

## 2017-06-28 DIAGNOSIS — Z833 Family history of diabetes mellitus: Secondary | ICD-10-CM | POA: Diagnosis not present

## 2017-06-28 DIAGNOSIS — Z89412 Acquired absence of left great toe: Secondary | ICD-10-CM | POA: Diagnosis not present

## 2017-06-28 DIAGNOSIS — Z880 Allergy status to penicillin: Secondary | ICD-10-CM | POA: Insufficient documentation

## 2017-06-28 DIAGNOSIS — Z794 Long term (current) use of insulin: Secondary | ICD-10-CM | POA: Insufficient documentation

## 2017-06-28 DIAGNOSIS — Z9889 Other specified postprocedural states: Secondary | ICD-10-CM | POA: Insufficient documentation

## 2017-06-28 DIAGNOSIS — Z91040 Latex allergy status: Secondary | ICD-10-CM | POA: Diagnosis not present

## 2017-06-28 DIAGNOSIS — Z823 Family history of stroke: Secondary | ICD-10-CM | POA: Insufficient documentation

## 2017-06-28 DIAGNOSIS — Z89411 Acquired absence of right great toe: Secondary | ICD-10-CM | POA: Diagnosis not present

## 2017-06-28 DIAGNOSIS — Z79899 Other long term (current) drug therapy: Secondary | ICD-10-CM | POA: Diagnosis not present

## 2017-06-28 DIAGNOSIS — E1122 Type 2 diabetes mellitus with diabetic chronic kidney disease: Secondary | ICD-10-CM | POA: Diagnosis not present

## 2017-06-28 DIAGNOSIS — Z992 Dependence on renal dialysis: Secondary | ICD-10-CM | POA: Insufficient documentation

## 2017-06-28 DIAGNOSIS — Z9884 Bariatric surgery status: Secondary | ICD-10-CM | POA: Insufficient documentation

## 2017-06-28 DIAGNOSIS — G4730 Sleep apnea, unspecified: Secondary | ICD-10-CM | POA: Diagnosis not present

## 2017-06-28 DIAGNOSIS — I499 Cardiac arrhythmia, unspecified: Secondary | ICD-10-CM | POA: Insufficient documentation

## 2017-06-28 DIAGNOSIS — I120 Hypertensive chronic kidney disease with stage 5 chronic kidney disease or end stage renal disease: Secondary | ICD-10-CM | POA: Diagnosis not present

## 2017-06-28 DIAGNOSIS — K219 Gastro-esophageal reflux disease without esophagitis: Secondary | ICD-10-CM | POA: Insufficient documentation

## 2017-06-28 DIAGNOSIS — Z7982 Long term (current) use of aspirin: Secondary | ICD-10-CM | POA: Insufficient documentation

## 2017-06-28 DIAGNOSIS — Z6839 Body mass index (BMI) 39.0-39.9, adult: Secondary | ICD-10-CM | POA: Diagnosis not present

## 2017-06-28 DIAGNOSIS — Z8249 Family history of ischemic heart disease and other diseases of the circulatory system: Secondary | ICD-10-CM | POA: Diagnosis not present

## 2017-06-28 DIAGNOSIS — Z881 Allergy status to other antibiotic agents status: Secondary | ICD-10-CM | POA: Diagnosis not present

## 2017-06-28 DIAGNOSIS — R569 Unspecified convulsions: Secondary | ICD-10-CM | POA: Diagnosis not present

## 2017-06-28 HISTORY — PX: AV FISTULA PLACEMENT: SHX1204

## 2017-06-28 LAB — CBC WITH DIFFERENTIAL/PLATELET
BASOS ABS: 0.1 10*3/uL (ref 0–0.1)
BASOS PCT: 1 %
EOS ABS: 0.7 10*3/uL (ref 0–0.7)
Eosinophils Relative: 10 %
HEMATOCRIT: 36 % (ref 35.0–47.0)
Hemoglobin: 12 g/dL (ref 12.0–16.0)
Lymphocytes Relative: 26 %
Lymphs Abs: 1.9 10*3/uL (ref 1.0–3.6)
MCH: 29.9 pg (ref 26.0–34.0)
MCHC: 33.4 g/dL (ref 32.0–36.0)
MCV: 89.4 fL (ref 80.0–100.0)
MONO ABS: 0.7 10*3/uL (ref 0.2–0.9)
Monocytes Relative: 10 %
NEUTROS ABS: 3.8 10*3/uL (ref 1.4–6.5)
Neutrophils Relative %: 53 %
PLATELETS: 190 10*3/uL (ref 150–440)
RBC: 4.03 MIL/uL (ref 3.80–5.20)
RDW: 17.7 % — AB (ref 11.5–14.5)
WBC: 7.2 10*3/uL (ref 3.6–11.0)

## 2017-06-28 LAB — BASIC METABOLIC PANEL
ANION GAP: 14 (ref 5–15)
BUN: 39 mg/dL — ABNORMAL HIGH (ref 6–20)
CALCIUM: 8 mg/dL — AB (ref 8.9–10.3)
CO2: 27 mmol/L (ref 22–32)
CREATININE: 8.41 mg/dL — AB (ref 0.44–1.00)
Chloride: 99 mmol/L — ABNORMAL LOW (ref 101–111)
GFR, EST AFRICAN AMERICAN: 6 mL/min — AB (ref >60)
GFR, EST NON AFRICAN AMERICAN: 6 mL/min — AB (ref >60)
GLUCOSE: 109 mg/dL — AB (ref 65–99)
Potassium: 4.8 mmol/L (ref 3.5–5.1)
Sodium: 140 mmol/L (ref 135–145)

## 2017-06-28 LAB — PROTIME-INR
INR: 1.07
PROTHROMBIN TIME: 13.8 s (ref 11.4–15.2)

## 2017-06-28 LAB — HCG, QUANTITATIVE, PREGNANCY: hCG, Beta Chain, Quant, S: 5 m[IU]/mL — ABNORMAL HIGH (ref <5)

## 2017-06-28 LAB — TYPE AND SCREEN
ABO/RH(D): O POS
Antibody Screen: NEGATIVE

## 2017-06-28 LAB — GLUCOSE, CAPILLARY
Glucose-Capillary: 103 mg/dL — ABNORMAL HIGH (ref 65–99)
Glucose-Capillary: 102 mg/dL — ABNORMAL HIGH (ref 65–99)

## 2017-06-28 LAB — APTT: aPTT: 41 seconds — ABNORMAL HIGH (ref 24–36)

## 2017-06-28 SURGERY — ARTERIOVENOUS (AV) FISTULA CREATION
Anesthesia: General | Laterality: Right

## 2017-06-28 MED ORDER — HYDROCODONE-ACETAMINOPHEN 7.5-325 MG PO TABS
1.0000 | ORAL_TABLET | Freq: Once | ORAL | Status: AC | PRN
  Administered 2017-06-28: 1 via ORAL
  Filled 2017-06-28: qty 1

## 2017-06-28 MED ORDER — PROPOFOL 500 MG/50ML IV EMUL
INTRAVENOUS | Status: AC
  Filled 2017-06-28: qty 50

## 2017-06-28 MED ORDER — FENTANYL CITRATE (PF) 100 MCG/2ML IJ SOLN
INTRAMUSCULAR | Status: AC
  Filled 2017-06-28: qty 2

## 2017-06-28 MED ORDER — BUPIVACAINE HCL (PF) 0.5 % IJ SOLN
INTRAMUSCULAR | Status: AC
  Filled 2017-06-28: qty 10

## 2017-06-28 MED ORDER — PROMETHAZINE HCL 25 MG/ML IJ SOLN: 6.2500 mg | INTRAMUSCULAR | Status: DC | PRN

## 2017-06-28 MED ORDER — PAPAVERINE HCL 30 MG/ML IJ SOLN
INTRAMUSCULAR | Status: AC
Start: 2017-06-28 — End: ?
  Filled 2017-06-28: qty 2

## 2017-06-28 MED ORDER — BUPIVACAINE HCL (PF) 0.5 % IJ SOLN
INTRAMUSCULAR | Status: DC | PRN
  Administered 2017-06-28: 20 mL via PERINEURAL

## 2017-06-28 MED ORDER — CLINDAMYCIN PHOSPHATE 300 MG/50ML IV SOLN
INTRAVENOUS | Status: AC
  Filled 2017-06-28: qty 50

## 2017-06-28 MED ORDER — CHLORHEXIDINE GLUCONATE CLOTH 2 % EX PADS: 6.0000 | MEDICATED_PAD | Freq: Once | CUTANEOUS | Status: DC

## 2017-06-28 MED ORDER — SODIUM CHLORIDE FLUSH 0.9 % IV SOLN
INTRAVENOUS | Status: AC
  Filled 2017-06-28: qty 10

## 2017-06-28 MED ORDER — HEPARIN SODIUM (PORCINE) 5000 UNIT/ML IJ SOLN
INTRAMUSCULAR | Status: AC
  Filled 2017-06-28: qty 1

## 2017-06-28 MED ORDER — HEPARIN SODIUM (PORCINE) 1000 UNIT/ML IJ SOLN
2000.0000 [IU] | Freq: Once | INTRAMUSCULAR | Status: DC
  Filled 2017-06-28: qty 2

## 2017-06-28 MED ORDER — EPINEPHRINE PF 1 MG/ML IJ SOLN
INTRAMUSCULAR | Status: AC
  Filled 2017-06-28: qty 1

## 2017-06-28 MED ORDER — LIDOCAINE HCL (PF) 1 % IJ SOLN
INTRAMUSCULAR | Status: DC | PRN
  Administered 2017-06-28: 5 mL

## 2017-06-28 MED ORDER — FENTANYL CITRATE (PF) 100 MCG/2ML IJ SOLN
INTRAMUSCULAR | Status: AC
  Administered 2017-06-28: 25 ug via INTRAVENOUS
  Filled 2017-06-28: qty 2

## 2017-06-28 MED ORDER — MIDAZOLAM HCL 2 MG/2ML IJ SOLN
INTRAMUSCULAR | Status: AC
  Filled 2017-06-28: qty 2

## 2017-06-28 MED ORDER — PROPOFOL 10 MG/ML IV BOLUS
INTRAVENOUS | Status: DC | PRN
  Administered 2017-06-28: 50 mg via INTRAVENOUS

## 2017-06-28 MED ORDER — PHENYLEPHRINE HCL 10 MG/ML IJ SOLN
INTRAMUSCULAR | Status: DC | PRN
  Administered 2017-06-28: 50 ug/min via INTRAVENOUS

## 2017-06-28 MED ORDER — FENTANYL CITRATE (PF) 100 MCG/2ML IJ SOLN
25.0000 ug | INTRAMUSCULAR | Status: DC | PRN
  Administered 2017-06-28 (×2): 25 ug via INTRAVENOUS

## 2017-06-28 MED ORDER — HEPARIN SODIUM (PORCINE) 1000 UNIT/ML IJ SOLN
INTRAMUSCULAR | Status: DC | PRN
  Administered 2017-06-28: 3000 [IU] via INTRAVENOUS

## 2017-06-28 MED ORDER — ACETAMINOPHEN 325 MG PO TABS: 325.0000 mg | ORAL_TABLET | ORAL | Status: DC | PRN

## 2017-06-28 MED ORDER — DEXAMETHASONE SODIUM PHOSPHATE 4 MG/ML IJ SOLN
INTRAMUSCULAR | Status: DC | PRN
  Administered 2017-06-28: 10 mg via INTRAVENOUS

## 2017-06-28 MED ORDER — ONDANSETRON HCL 4 MG/2ML IJ SOLN
INTRAMUSCULAR | Status: DC | PRN
  Administered 2017-06-28: 4 mg via INTRAVENOUS

## 2017-06-28 MED ORDER — SODIUM CHLORIDE 0.9 % IV SOLN
INTRAVENOUS | Status: DC
  Administered 2017-06-28 (×2): via INTRAVENOUS

## 2017-06-28 MED ORDER — PROPOFOL 10 MG/ML IV BOLUS
INTRAVENOUS | Status: AC
  Filled 2017-06-28: qty 20

## 2017-06-28 MED ORDER — FENTANYL CITRATE (PF) 100 MCG/2ML IJ SOLN
INTRAMUSCULAR | Status: AC
  Administered 2017-06-28: 100 ug
  Filled 2017-06-28: qty 2

## 2017-06-28 MED ORDER — MIDAZOLAM HCL 2 MG/2ML IJ SOLN
INTRAMUSCULAR | Status: AC
  Administered 2017-06-28: 2 mg
  Filled 2017-06-28: qty 2

## 2017-06-28 MED ORDER — HEPARIN SODIUM (PORCINE) 1000 UNIT/ML IJ SOLN: INTRAMUSCULAR | Status: DC | PRN

## 2017-06-28 MED ORDER — OXYCODONE-ACETAMINOPHEN 5-325 MG PO TABS: 1.0000 | ORAL_TABLET | ORAL | 0 refills | Status: DC | PRN

## 2017-06-28 MED ORDER — CHLORHEXIDINE GLUCONATE CLOTH 2 % EX PADS
6.0000 | MEDICATED_PAD | Freq: Once | CUTANEOUS | Status: AC
  Administered 2017-06-28: 6 via TOPICAL

## 2017-06-28 MED ORDER — ACETAMINOPHEN 160 MG/5ML PO SOLN
325.0000 mg | ORAL | Status: DC | PRN
  Filled 2017-06-28: qty 20.3

## 2017-06-28 MED ORDER — MIDAZOLAM HCL 2 MG/2ML IJ SOLN
INTRAMUSCULAR | Status: DC | PRN
  Administered 2017-06-28: 2 mg via INTRAVENOUS

## 2017-06-28 MED ORDER — HYDROCODONE-ACETAMINOPHEN 7.5-325 MG PO TABS
ORAL_TABLET | ORAL | Status: AC
  Administered 2017-06-28: 1 via ORAL
  Filled 2017-06-28: qty 1

## 2017-06-28 MED ORDER — EVICEL 2 ML EX KIT
PACK | CUTANEOUS | Status: AC
  Filled 2017-06-28: qty 1

## 2017-06-28 MED ORDER — LIDOCAINE HCL (PF) 1 % IJ SOLN
INTRAMUSCULAR | Status: AC
  Filled 2017-06-28: qty 5

## 2017-06-28 MED ORDER — PROPOFOL 500 MG/50ML IV EMUL
INTRAVENOUS | Status: DC | PRN
  Administered 2017-06-28: 100 ug/kg/min via INTRAVENOUS

## 2017-06-28 SURGICAL SUPPLY — 51 items
BAG DECANTER FOR FLEXI CONT (MISCELLANEOUS) ×3 IMPLANT
BLADE SURG SZ11 CARB STEEL (BLADE) ×3 IMPLANT
BOOT SUTURE AID YELLOW STND (SUTURE) ×3 IMPLANT
BRUSH SCRUB EZ  4% CHG (MISCELLANEOUS) ×2
BRUSH SCRUB EZ 4% CHG (MISCELLANEOUS) ×1 IMPLANT
CANISTER SUCT 1200ML W/VALVE (MISCELLANEOUS) ×3 IMPLANT
CHLORAPREP W/TINT 26ML (MISCELLANEOUS) ×3 IMPLANT
CLIP SPRNG 6MM S-JAW DBL (CLIP) ×3
DERMABOND ADVANCED (GAUZE/BANDAGES/DRESSINGS) ×2
DERMABOND ADVANCED .7 DNX12 (GAUZE/BANDAGES/DRESSINGS) ×1 IMPLANT
ELECT CAUTERY BLADE 6.4 (BLADE) ×3 IMPLANT
ELECT REM PT RETURN 9FT ADLT (ELECTROSURGICAL) ×3
ELECTRODE REM PT RTRN 9FT ADLT (ELECTROSURGICAL) ×1 IMPLANT
GEL ULTRASOUND 20GR AQUASONIC (MISCELLANEOUS) IMPLANT
GLOVE BIO SURGEON STRL SZ7 (GLOVE) ×6 IMPLANT
GLOVE INDICATOR 7.5 STRL GRN (GLOVE) ×3 IMPLANT
GOWN STRL REUS W/ TWL LRG LVL3 (GOWN DISPOSABLE) ×1 IMPLANT
GOWN STRL REUS W/ TWL XL LVL3 (GOWN DISPOSABLE) ×2 IMPLANT
GOWN STRL REUS W/TWL LRG LVL3 (GOWN DISPOSABLE) ×2
GOWN STRL REUS W/TWL XL LVL3 (GOWN DISPOSABLE) ×4
HEMOSTAT SURGICEL 2X3 (HEMOSTASIS) ×6 IMPLANT
IV NS 500ML (IV SOLUTION) ×2
IV NS 500ML BAXH (IV SOLUTION) ×1 IMPLANT
KIT TURNOVER KIT A (KITS) ×3 IMPLANT
LABEL OR SOLS (LABEL) ×3 IMPLANT
LOOP RED MAXI  1X406MM (MISCELLANEOUS) ×2
LOOP VESSEL MAXI 1X406 RED (MISCELLANEOUS) ×1 IMPLANT
LOOP VESSEL MINI 0.8X406 BLUE (MISCELLANEOUS) ×1 IMPLANT
LOOPS BLUE MINI 0.8X406MM (MISCELLANEOUS) ×2
NEEDLE FILTER BLUNT 18X 1/2SAF (NEEDLE) ×2
NEEDLE FILTER BLUNT 18X1 1/2 (NEEDLE) ×1 IMPLANT
NEEDLE HYPO 30X.5 LL (NEEDLE) IMPLANT
NS IRRIG 500ML POUR BTL (IV SOLUTION) ×3 IMPLANT
PACK EXTREMITY ARMC (MISCELLANEOUS) ×3 IMPLANT
PAD PREP 24X41 OB/GYN DISP (PERSONAL CARE ITEMS) ×3 IMPLANT
SOLUTION CELL SAVER (CLIP) ×1 IMPLANT
STOCKINETTE STRL 4IN 9604848 (GAUZE/BANDAGES/DRESSINGS) ×3 IMPLANT
SUT MNCRL AB 4-0 PS2 18 (SUTURE) ×3 IMPLANT
SUT PROLENE 6 0 BV (SUTURE) ×12 IMPLANT
SUT SILK 2 0 (SUTURE) ×2
SUT SILK 2-0 18XBRD TIE 12 (SUTURE) ×1 IMPLANT
SUT SILK 3 0 (SUTURE) ×2
SUT SILK 3-0 18XBRD TIE 12 (SUTURE) ×1 IMPLANT
SUT SILK 4 0 (SUTURE) ×2
SUT SILK 4-0 18XBRD TIE 12 (SUTURE) ×1 IMPLANT
SUT VIC AB 3-0 SH 27 (SUTURE) ×4
SUT VIC AB 3-0 SH 27X BRD (SUTURE) ×2 IMPLANT
SYR 20CC LL (SYRINGE) ×3 IMPLANT
SYR 3ML LL SCALE MARK (SYRINGE) ×3 IMPLANT
SYR TB 1ML 27GX1/2 LL (SYRINGE) IMPLANT
TOWEL OR 17X26 4PK STRL BLUE (TOWEL DISPOSABLE) IMPLANT

## 2017-06-28 NOTE — H&P (Signed)
[facility] History & Physical Update  The patient was interviewed and re-examined.  The patient's previous History and Physical has been reviewed and is unchanged.  There is no change in the plan of care. We plan to proceed with the scheduled procedure.  Leotis Pain, MD  06/28/2017, 12:52 PM

## 2017-06-28 NOTE — Op Note (Signed)
    OPERATIVE NOTE   PROCEDURE: 1. Right brachiobasilic arteriovenous fistula placement  PRE-OPERATIVE DIAGNOSIS: ESRD  POST-OPERATIVE DIAGNOSIS: same as above  SURGEON: Leotis Pain, MD  ASSISTANT(S): none  ANESTHESIA: geenral  ESTIMATED BLOOD LOSS: 10 cc  FINDING(S): 1. none  SPECIMEN(S):  none  INDICATIONS:   [name] is a 32 y.o. female who presents with renal failure and multiple previous access placements.  The patient is scheduled for right side first stage brachiobasilic AVF creation.  The patient is aware the risks include but are not limited to: bleeding, infection, steal syndrome, nerve damage, ischemic monomelic neuropathy, failure to mature, and need for additional procedures.  The patient is aware of the risks of the procedure and elects to proceed forward.  DESCRIPTION: After full informed written consent was obtained from the patient, the patient was brought back to the operating room and placed supine upon the operating table.  Prior to induction, the patient received IV antibiotics.   After obtaining adequate anesthesia, the patient was then prepped and draped in the standard fashion for a right arm access procedure.  I turned my attention first to identifying the patient's basilic vein and brachial artery.   I made an oblique incision at the level of the antecubital fossa and dissected through the subcutaneous tissue and fascia to gain exposure of the brachial artery.  This was noted to be suitable for fistula creation.  This was dissected out proximally and distally and controlled with vessel loops .  I then dissected out the basilic vein.  This was noted to be suitable for fistula creation. The patient was then heparinized.  The vein was marked for orientation and distal segment of the vein was ligated with a  2-0 silk, and the vein was transected.  I then instilled the heparinized saline into the vein and clamped it.  At this point, I reset my exposure of the  brachial artery and placed the artery under tension proximally and distally.  I made an arteriotomy with a #11 blade, and then I extended the arteriotomy with a Potts scissor.  I then irrigated the artery with heparinized saline.  The vein was cut and beveled to appropriate length to match the arteriotomy.  The vein was then sewn to the artery in an end-to-side configuration with a running 6-0 Prolene suture.  Prior to completing this anastomosis, I allowed the vein and artery to backbleed.  There was no evidence of clot from any vessels.  I completed the anastomosis in the usual fashion and then released all vessel loops and clamps.  There was a soft palpable  thrill in the venous outflow, and there was a palpable radial pulse.  At this point, I irrigated out the surgical wound and placed Surgicel.  There was no further active bleeding.  The subcutaneous tissue was reapproximated with a running stitch of 3-0 Vicryl.  The skin was then reapproximated with a running subcuticular stitch of 4-0 Monocryl.  The skin was then cleaned, dried, and reinforced with Dermabond.  The patient tolerated this procedure well and was taken to the recovery room in stable condition.  COMPLICATIONS: None  CONDITION: Stable  Leotis Pain  06/28/2017, 3:55 PM    This note was created with Dragon Medical transcription system. Any errors in dictation are purely unintentional.

## 2017-06-28 NOTE — Anesthesia Procedure Notes (Signed)
Date/Time: 06/28/2017 2:35 PM Performed by: Nelda Marseille, CRNA Pre-anesthesia Checklist: Patient identified, Emergency Drugs available, Suction available, Patient being monitored and Timeout performed Oxygen Delivery Method: Simple face mask

## 2017-06-28 NOTE — Anesthesia Procedure Notes (Signed)
Anesthesia Regional Block: Supraclavicular block   Pre-Anesthetic Checklist: ,, timeout performed, Correct Patient, Correct Site, Correct Laterality, Correct Procedure, Correct Position, risks and benefits discussed, surgical consent, pre-op evaluation,  At surgeon's request and post-op pain management  Laterality: Right  Prep: chloraprep       Needles:   Needle Type: Echogenic Stimulator Needle     Needle Length: 10cm  Needle Gauge: 20   Needle insertion depth: 6 cm   Additional Needles:   Procedures: Doppler guided,,,, ultrasound used (permanent image in chart), intact distal pulses,,,  Narrative:  Start time: 06/28/2017 12:33 PM End time: 06/28/2017 12:45 PM Injection made incrementally with aspirations every 5 mL.  Performed by: Personally  Anesthesiologist: Alphonsus Sias, MD

## 2017-06-28 NOTE — Anesthesia Preprocedure Evaluation (Addendum)
Anesthesia Evaluation  Patient identified by MRN, date of birth, ID band Patient awake    Reviewed: Allergy & Precautions, H&P , NPO status , reviewed documented beta blocker date and time   Airway Mallampati: II  TM Distance: <3 FB Neck ROM: full    Dental  (+) Chipped   Pulmonary asthma , sleep apnea , Current Smoker,    Pulmonary exam normal        Cardiovascular hypertension, Normal cardiovascular exam+ dysrhythmias      Neuro/Psych Seizures -,     GI/Hepatic GERD  Controlled and Medicated,  Endo/Other  diabetes  Renal/GU DialysisRenal disease     Musculoskeletal   Abdominal   Peds  Hematology  (+) anemia ,   Anesthesia Other Findings Past Medical History: No date: Anemia No date: Asthma No date: Diabetes mellitus No date: Dysrhythmia Jun 17 2015: Gastric bypass status for obesity No date: GERD (gastroesophageal reflux disease) No date: Hypertension No date: Irregular heart beat No date: Morbid obesity ([place]) No date: Renal insufficiency No date: Seizures ([place]) No date: Sleep apnea  Past Surgical History: 08/26/10: AV FISTULA PLACEMENT     Comment:  Left Radiocephalic AVF 07/31/1599: AV FISTULA PLACEMENT     Comment:  Procedure: ARTERIOVENOUS (AV) FISTULA CREATION;                Surgeon: Algernon Huxley, MD;  Location: [facility] ORS;  Service:               Vascular;; No date: BACK SURGERY 05/26/2017: CARPAL TUNNEL RELEASE; Left     Comment:  Procedure: CARPAL TUNNEL RELEASE;  Surgeon: Hessie Knows, MD;  Location: [facility];  Service: Orthopedics;               Laterality: Left; 06/09/2017: CARPAL TUNNEL RELEASE; Left     Comment:  Procedure: CARPAL TUNNEL RELEASE;  Surgeon: Hessie Knows, MD;  Location: [facility];  Service: Orthopedics;               Laterality: Left; 03/07/2016: DIALYSIS/PERMA CATHETER REMOVAL; N/A     Comment:  Procedure: Dialysis/Perma Catheter Removal;  Surgeon:                Algernon Huxley, MD;  Location: [place] [facility];                Service: Cardiovascular;  Laterality: N/A; No date: EYE SURGERY     Comment:  laser photocoagulation 2008: EYE SURGERY; Right     Comment:  removal of blood clot No date: INTRAUTERINE DEVICE (IUD) INSERTION 07/07/2014: PERIPHERAL VASCULAR CATHETERIZATION; N/A     Comment:  Procedure: A/V Shuntogram/Fistulagram;  Surgeon: Algernon Huxley, MD;  Location: [place] [facility];  Service:               Cardiovascular;  Laterality: N/A; 07/07/2014: PERIPHERAL VASCULAR CATHETERIZATION; N/A     Comment:  Procedure: A/V Shunt Intervention;  Surgeon: Algernon Huxley, MD;  Location: [place] [facility];  Service:               Cardiovascular;  Laterality: N/A; 01/12/2015: PERIPHERAL VASCULAR CATHETERIZATION; Right     Comment:  Procedure: A/V Shuntogram/Fistulagram;  Surgeon: Algernon Huxley, MD;  Location: [place] [facility];  Service:               Cardiovascular;  Laterality: Right; 01/12/2015: PERIPHERAL VASCULAR CATHETERIZATION; N/A     Comment:  Procedure: A/V Shunt Intervention;  Surgeon: Algernon Huxley, MD;  Location: [place] [facility];  Service:               Cardiovascular;  Laterality: N/A; 06/08/2015: PERIPHERAL VASCULAR CATHETERIZATION; Right     Comment:  Procedure: A/V Shuntogram/Fistulagram;  Surgeon: Algernon Huxley, MD;  Location: [place] [facility];  Service:               Cardiovascular;  Laterality: Right; 06/08/2015: PERIPHERAL VASCULAR CATHETERIZATION; N/A     Comment:  Procedure: A/V Shunt Intervention;  Surgeon: Algernon Huxley, MD;  Location: [place] [facility];  Service:               Cardiovascular;  Laterality: N/A; 07/14/2015: PERIPHERAL VASCULAR CATHETERIZATION; Right     Comment:  Procedure: Thrombectomy;  Surgeon: Katha Cabal,               MD;  Location: [place] [facility];  Service:               Cardiovascular;   Laterality: Right; 07/14/2015: PERIPHERAL VASCULAR CATHETERIZATION; N/A     Comment:  Procedure: A/V Shuntogram/Fistulagram;  Surgeon: Katha Cabal, MD;  Location: [place] [facility];  Service:              Cardiovascular;  Laterality: N/A; 07/17/2015: PERIPHERAL VASCULAR CATHETERIZATION; N/A     Comment:  Procedure: Thrombectomy;  Surgeon: Katha Cabal,               MD;  Location: [place] [facility];  Service:               Cardiovascular;  Laterality: N/A; 07/17/2015: PERIPHERAL VASCULAR CATHETERIZATION; N/A     Comment:  Procedure: Dialysis/Perma Catheter Insertion;  Surgeon:               Katha Cabal, MD;  Location: [place] [facility];                Service: Cardiovascular;  Laterality: N/A; 08/05/2015: PERIPHERAL VASCULAR CATHETERIZATION; N/A     Comment:  Procedure: Thrombectomy;  Surgeon: Katha Cabal,               MD;  Location: [place] [facility];  Service:               Cardiovascular;  Laterality: N/A;  declot of graft 05/10/2017: PERIPHERAL VASCULAR THROMBECTOMY; N/A     Comment:  Procedure: PERIPHERAL VASCULAR THROMBECTOMY;  Surgeon:               Algernon Huxley, MD;  Location: [place] [facility];                Service: Cardiovascular;  Laterality: N/A; No date: TOE AMPUTATION     Comment:  both big toes removed     Reproductive/Obstetrics                            Anesthesia Physical Anesthesia Plan  ASA: IV  Anesthesia Plan: General and Regional   Post-op Pain Management: GA combined w/ Regional for post-op pain   Induction:   PONV Risk Score and Plan: 3 and Treatment may vary due to age or medical condition, Ondansetron, Midazolam and Metaclopromide  Airway Management Planned:   Additional Equipment:   Intra-op Plan:   Post-operative Plan:   Informed Consent: I have reviewed the patients History and Physical, chart, labs and discussed the procedure including the risks, benefits and  alternatives for the proposed anesthesia with the patient or authorized representative who has indicated his/her understanding and acceptance.   Dental Advisory Given  Plan Discussed with: CRNA  Anesthesia Plan Comments:        Anesthesia Quick Evaluation

## 2017-06-28 NOTE — Anesthesia Postprocedure Evaluation (Signed)
Anesthesia Post Note  Patient: [name]  Procedure(s) Performed: ARTERIOVENOUS (AV) FISTULA CREATION ( BRACHIAL BASILIC ) (Right )  Patient location during evaluation: PACU Anesthesia Type: General Level of consciousness: awake and alert Pain management: pain level controlled Vital Signs Assessment: post-procedure vital signs reviewed and stable Respiratory status: spontaneous breathing and respiratory function stable Cardiovascular status: stable Anesthetic complications: no     Last Vitals:  Vitals:   06/28/17 1642 06/28/17 1659  BP: 123/88 132/81  Pulse: 85 83  Resp: 11 14  Temp: 36.6 C 36.5 C  SpO2: 98% 100%    Last Pain:  Vitals:   06/28/17 1659  TempSrc: Oral  PainSc: 0-No pain                 Denine Brotz K

## 2017-06-28 NOTE — Discharge Instructions (Signed)
Peripheral Nerve Block, Upper Extremity (PNBUE) Discharge Instructions  ° °1.  For your surgery you have received a Peripheral Nerve Block. °2. Nerve Blocks affect many types of nerves, including nerves that control movement, pain and normal sensation.  You may experience feelings such as numbness, tingling, heaviness, weakness or the inability to move your arm or the feeling or sensation that your arm has "fallen asleep". °3. A nerve block can last for 2 - 36 hours or more depending on the medication used.  Usually the weakness wears off first.  The tingling and heaviness usually wear off next.  Finally you may start to notice pain.  Keep in mind that this may occur in any order.  once a nerve block starts to wear off it is usually completely gone within 60 minutes. °4. If needed, your surgeon will give you a prescription for pain medication.  It will take about 60 minutes for the oral pain medication to become fully effective.  So, it is recommended that you start taking this medication before the nerve block first begins to wear off, or when you first begin to feel discomfort. °5. Keep in mind that nerve blocks often wear off in the middle of the night.  If you are going to bed and the block has not started to wear off or you have not started to have any discomfort, consider setting an alarm for 2 to 3 hours, so you can assess your block.  If you notice the block is wearing off or you are starting to have discomfort, you can take your pain medication. °6. Take your pain medication only as prescribed.  Pain medication can cause sedation and decrease your breathing if you take more than you need for the level of pain that you have. °7. Nausea is a common side effect of many pain medications.  You may want to eat something before taking your pain medicine to prevent nausea. °8. After a peripheral nerve block, you cannot feel pain, pressure or extremes in temperature in the effected arm.  Because your arm is numb it  is at an increased risk for injury.  To decrease the possibility of injury, please practice the following: ° °a. While you are awake change the position of your arm frequently to prevent too much pressure on any one area for prolonged periods of time. °b.  If you have a cast or tight dressing, check the color or your fingers every couple of hours.  Call your surgeon with the appearance of any discoloration (white or blue). °c. If you are given a sling to wear before you go home, please wear it  at all times until the block has completely worn off.  Do not get up at night without your sling. °d. If you experience any problems or concerns, please contact your surgeon's office. °e. If you experience severe or prolonged shortness of breath go to the nearest emergency department. ° ° ° °AMBULATORY SURGERY  °DISCHARGE INSTRUCTIONS ° ° °1) The drugs that you were given will stay in your system until tomorrow so for the next 24 hours you should not: ° °A) Drive an automobile °B) Make any legal decisions °C) Drink any alcoholic beverage ° ° °2) You may resume regular meals tomorrow.  Today it is better to start with liquids and gradually work up to solid foods. ° °You may eat anything you prefer, but it is better to start with liquids, then soup and crackers, and gradually work up to   solid foods. ° ° °3) Please notify your doctor immediately if you have any unusual bleeding, trouble breathing, redness and pain at the surgery site, drainage, fever, or pain not relieved by medication. ° ° ° °4) Additional Instructions: ° ° ° ° ° ° ° °Please contact your physician with any problems or Same Day Surgery at [phone], Monday through Friday 6 am to 4 pm, or [place] [facility] Main number at [phone]. °

## 2017-06-28 NOTE — Transfer of Care (Signed)
Immediate Anesthesia Transfer of Care Note  Patient: [name]  Procedure(s) Performed: ARTERIOVENOUS (AV) FISTULA CREATION ( BRACHIAL BASILIC ) (Right )  Patient Location: PACU  Anesthesia Type:General  Level of Consciousness: awake and sedated  Airway & Oxygen Therapy: Patient Spontanous Breathing and Patient connected to face mask oxygen  Post-op Assessment: Report given to RN and Post -op Vital signs reviewed and stable  Post vital signs: Reviewed and stable  Last Vitals:  Vitals Value Taken Time  BP 125/82 06/28/2017  3:57 PM  Temp    Pulse 87 06/28/2017  3:58 PM  Resp 21 06/28/2017  3:58 PM  SpO2 96 % 06/28/2017  3:58 PM  Vitals shown include unvalidated device data.  Last Pain:  Vitals:   06/28/17 1255  TempSrc:   PainSc: 0-No pain         Complications: No apparent anesthesia complications

## 2017-06-28 NOTE — Anesthesia Post-op Follow-up Note (Signed)
Anesthesia QCDR form completed.        

## 2017-06-29 ENCOUNTER — Encounter: Payer: Self-pay | Admitting: Vascular Surgery

## 2017-07-06 ENCOUNTER — Ambulatory Visit (INDEPENDENT_AMBULATORY_CARE_PROVIDER_SITE_OTHER): Payer: Medicare Other | Admitting: Vascular Surgery

## 2017-07-06 ENCOUNTER — Ambulatory Visit
Admission: RE | Admit: 2017-07-06 | Discharge: 2017-07-06 | Disposition: A | Discharge status: Home / Self Care | Payer: Medicare Other | Source: Ambulatory Visit | Attending: Vascular Surgery | Admitting: Vascular Surgery

## 2017-07-06 ENCOUNTER — Encounter (INDEPENDENT_AMBULATORY_CARE_PROVIDER_SITE_OTHER): Payer: Self-pay | Admitting: Vascular Surgery

## 2017-07-06 VITALS — BP 106/70 | HR 86 | Resp 16 | Ht 64.0 in | Wt 231.0 lb

## 2017-07-06 DIAGNOSIS — T829XXS Unspecified complication of cardiac and vascular prosthetic device, implant and graft, sequela: Secondary | ICD-10-CM | POA: Insufficient documentation

## 2017-07-06 DIAGNOSIS — Z992 Dependence on renal dialysis: Secondary | ICD-10-CM | POA: Insufficient documentation

## 2017-07-06 DIAGNOSIS — N186 End stage renal disease: Secondary | ICD-10-CM

## 2017-07-06 NOTE — Progress Notes (Signed)
Subjective:    Patient ID: [name], female    DOB: 1985-03-22, 32 y.o.   MRN: [id number] Chief Complaint  Patient presents with  . Follow-up    Arm swollen at the access site   Patient presents sooner than her first postoperative follow-up with a chief complaint of worsening right upper extremity edema.  The patient is status post a right brachiobasilic arteriovenous fistula placement on Jun 28, 2017.  The patient notes that she is experiencing progressively worsening swelling and pain to the right upper extremity.  The patient notes palpating a thrill.  She denies any issues with her incision.  The patient denies any erythema or drainage to the incision or right upper extremity.  The patient is currently being maintained by a PermCath without issue.  The patient denies any ulceration to the right upper extremity/hand.  The patient denies any fever, nausea or vomiting.  Review of Systems  Constitutional: Negative.   HENT: Negative.   Eyes: Negative.   Respiratory: Negative.   Cardiovascular: Negative.   Gastrointestinal: Negative.   Endocrine: Negative.   Genitourinary:       ESRD  Musculoskeletal: Negative.   Skin: Negative.   Allergic/Immunologic: Negative.   Neurological: Negative.   Hematological: Negative.   Psychiatric/Behavioral: Negative.       Objective:   Physical Exam  Constitutional: She is oriented to person, place, and time. She appears well-developed and well-nourished. No distress.  HENT:  Head: Normocephalic and atraumatic.  Right Ear: External ear normal.  Left Ear: External ear normal.  Eyes: Pupils are equal, round, and reactive to light. Conjunctivae and EOM are normal.  Neck: Normal range of motion.  Cardiovascular: Normal rate, regular rhythm, normal heart sounds and intact distal pulses.  Pulses:      Radial pulses are 1+ on the right side, and 2+ on the left side.  Right upper extremity: Dialysis access - bruit and thrill auscultated.   Incision is clean dry and intact.  The upper part of the arm is moderately edematous.  There is no erythema or cellulitis noted.  There is no ulceration or necrotic tissue noted.  There is no ulceration noted to the right hand.  Good capillary refill.  Faint radial pulse noted.  Pulmonary/Chest: Effort normal and breath sounds normal.  Musculoskeletal: Normal range of motion. She exhibits edema (Moderate nonpitting right upper extremity edema noted).  Neurological: She is alert and oriented to person, place, and time.  Skin: Skin is warm. She is not diaphoretic.  Psychiatric: She has a normal mood and affect. Her behavior is normal. Judgment and thought content normal.  Vitals reviewed.  BP 106/70 (BP Location: Left Arm, Patient Position: Sitting)   Pulse 86   Resp 16   Ht 5\' 4"  (1.626 m)   Wt 231 lb (104.8 kg)   BMI 39.65 kg/m   Past Medical History:  Diagnosis Date  . Anemia   . Asthma   . Diabetes mellitus   . Dysrhythmia   . 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 GERD (gastroesophageal reflux disease)   . Hypertension   . Irregular heart beat   . Morbid obesity ([place])   . Renal insufficiency   . Seizures ([place])   . Sleep apnea    Social History   Socioeconomic History  . Marital status: Single    Spouse name: Not on file  . Number of children: Not on file  . Years of education: Not on file  .  Highest education level: Not on file  Occupational History  . Not on file  Social Needs  . Financial resource strain: Not on file  . Food insecurity:    Worry: Not on file    Inability: Not on file  . Transportation needs:    Medical: Not on file    Non-medical: Not on file  Tobacco Use  . Smoking status: Light Tobacco Smoker    Packs/day: 0.25    Years: 10.00    Pack years: 2.50    Types: Cigarettes  . Smokeless tobacco: Never Used  Substance and Sexual Activity  . Alcohol use: No  . Drug use: No  . Sexual activity: Yes    Birth control/protection: IUD    Lifestyle  . Physical activity:    Days per week: Not on file    Minutes per session: Not on file  . Stress: Not on file  Relationships  . Social connections:    Talks on phone: Not on file    Gets together: Not on file    Attends religious service: Not on file    Active member of club or organization: Not on file    Attends meetings of clubs or organizations: Not on file    Relationship status: Not on file  . Intimate partner violence:    Fear of current or ex partner: Not on file    Emotionally abused: Not on file    Physically abused: Not on file    Forced sexual activity: Not on file  Other Topics Concern  . Not on file  Social History Narrative  . Not on file   Past Surgical History:  Procedure Laterality Date  . AV FISTULA PLACEMENT  08/26/10   Left Radiocephalic AVF  . AV FISTULA PLACEMENT  08/26/2015   Procedure: ARTERIOVENOUS (AV) FISTULA CREATION;  Surgeon: Algernon Huxley, MD;  Location: [facility];  Service: Vascular;;  . AV FISTULA PLACEMENT Right 06/28/2017   Procedure: ARTERIOVENOUS (AV) FISTULA CREATION ( BRACHIAL BASILIC );  Surgeon: Algernon Huxley, MD;  Location: [facility];  Service: Vascular;  Laterality: Right;  . BACK SURGERY    . CARPAL TUNNEL RELEASE Left 05/26/2017   Procedure: CARPAL TUNNEL RELEASE;  Surgeon: Hessie Knows, MD;  Location: [facility];  Service: Orthopedics;  Laterality: Left;  . CARPAL TUNNEL RELEASE Left 06/09/2017   Procedure: CARPAL TUNNEL RELEASE;  Surgeon: Hessie Knows, MD;  Location: [facility];  Service: Orthopedics;  Laterality: Left;  . DIALYSIS/PERMA CATHETER REMOVAL N/A 03/07/2016   Procedure: Dialysis/Perma Catheter Removal;  Surgeon: Algernon Huxley, MD;  Location: [place] [facility];  Service: Cardiovascular;  Laterality: N/A;  . EYE SURGERY     laser photocoagulation  . EYE SURGERY Right 2008   removal of blood clot  . INTRAUTERINE DEVICE (IUD) INSERTION    . PERIPHERAL VASCULAR CATHETERIZATION N/A 07/07/2014   Procedure: A/V  Shuntogram/Fistulagram;  Surgeon: Algernon Huxley, MD;  Location: [place] [facility];  Service: Cardiovascular;  Laterality: N/A;  . PERIPHERAL VASCULAR CATHETERIZATION N/A 07/07/2014   Procedure: A/V Shunt Intervention;  Surgeon: Algernon Huxley, MD;  Location: [place] [facility];  Service: Cardiovascular;  Laterality: N/A;  . PERIPHERAL VASCULAR CATHETERIZATION Right 01/12/2015   Procedure: A/V Shuntogram/Fistulagram;  Surgeon: Algernon Huxley, MD;  Location: [place] [facility];  Service: Cardiovascular;  Laterality: Right;  . PERIPHERAL VASCULAR CATHETERIZATION N/A 01/12/2015   Procedure: A/V Shunt Intervention;  Surgeon: Algernon Huxley, MD;  Location: [place]  [facility];  Service: Cardiovascular;  Laterality: N/A;  . PERIPHERAL VASCULAR CATHETERIZATION Right 06/08/2015   Procedure: A/V Shuntogram/Fistulagram;  Surgeon: Algernon Huxley, MD;  Location: [place] [facility];  Service: Cardiovascular;  Laterality: Right;  . PERIPHERAL VASCULAR CATHETERIZATION N/A 06/08/2015   Procedure: A/V Shunt Intervention;  Surgeon: Algernon Huxley, MD;  Location: [place] [facility];  Service: Cardiovascular;  Laterality: N/A;  . PERIPHERAL VASCULAR CATHETERIZATION Right 07/14/2015   Procedure: Thrombectomy;  Surgeon: Katha Cabal, MD;  Location: [place] [facility];  Service: Cardiovascular;  Laterality: Right;  . PERIPHERAL VASCULAR CATHETERIZATION N/A 07/14/2015   Procedure: A/V Shuntogram/Fistulagram;  Surgeon: Katha Cabal, MD;  Location: [place] [facility];  Service: Cardiovascular;  Laterality: N/A;  . PERIPHERAL VASCULAR CATHETERIZATION N/A 07/17/2015   Procedure: Thrombectomy;  Surgeon: Katha Cabal, MD;  Location: [place] [facility];  Service: Cardiovascular;  Laterality: N/A;  . PERIPHERAL VASCULAR CATHETERIZATION N/A 07/17/2015   Procedure: Dialysis/Perma Catheter Insertion;  Surgeon: Katha Cabal, MD;  Location: [place] [facility];  Service: Cardiovascular;  Laterality: N/A;  . PERIPHERAL  VASCULAR CATHETERIZATION N/A 08/05/2015   Procedure: Thrombectomy;  Surgeon: Katha Cabal, MD;  Location: [place] [facility];  Service: Cardiovascular;  Laterality: N/A;  declot of graft  . PERIPHERAL VASCULAR THROMBECTOMY N/A 05/10/2017   Procedure: PERIPHERAL VASCULAR THROMBECTOMY;  Surgeon: Algernon Huxley, MD;  Location: [place] [facility];  Service: Cardiovascular;  Laterality: N/A;  . TOE AMPUTATION     both big toes removed   Family History  Problem Relation Age of Onset  . Diabetes Mother   . Heart attack Mother   . Stroke Mother   . High Cholesterol Mother   . Diabetes Father    Allergies  Allergen Reactions  . Marcillin [Ampicillin] Rash and Other (See Comments)    Breathing and heart palp  Hot and sweating Has patient had a PCN reaction causing immediate rash, facial/tongue/throat swelling, SOB or lightheadedness with hypotension: Yes Has patient had a PCN reaction causing severe rash involving mucus membranes or skin necrosis: No Has patient had a PCN reaction that required hospitalization Unknown Has patient had a PCN reaction occurring within the last 10 years: Yes If all of the above answers are "NO", then may proceed with Cephalosporin use.   [name] Kitchen Penicillins Other (See Comments)    Heart races  . Latex Swelling and Rash  . Vancomycin Itching, Nausea Only and Rash    Thick pieces of skin pulls off.      Assessment & Plan:  Patient presents sooner than her first postoperative follow-up with a chief complaint of worsening right upper extremity edema.  The patient is status post a right brachiobasilic arteriovenous fistula placement on Jun 28, 2017.  The patient notes that she is experiencing progressively worsening swelling and pain to the right upper extremity.  The patient notes palpating a thrill.  She denies any issues with her incision.  The patient denies any erythema or drainage to the incision or right upper extremity.  The patient is currently being maintained  by a PermCath without issue.  The patient denies any ulceration to the right upper extremity/hand.  The patient denies any fever, nausea or vomiting.  1. End stage renal disease ([place]) - Stable Recent is status post creation of a right brachiobasilic AV fistula stage I. The patient is experiencing more swelling post operatively than normal. There is no acute vascular compromise to the right upper extremity  and there is a palpable thrill and audible bruit noted. I will place a stat order to have the patient undergo right upper extremity DVT study to rule out DVT status post surgery at [facility]. The patient is to continue to elevate her right upper extremity Will call the patient with her ultrasound results.  2. Complication from renal dialysis device, sequela - Stable As above.  - US Venous Img Upper Uni Right; Future  Current Outpatient Medications on File Prior to Visit  Medication Sig Dispense Refill  . aspirin 81 MG tablet Take 81 mg by mouth daily.      . B Complex-C-Folic Acid (RENA-VITE RX) 1 MG TABS Take by mouth.    . cinacalcet (SENSIPAR) 30 MG tablet Take 30 mg by mouth See admin instructions. Given at dialysis Tue Thu Sat    . darbepoetin (ARANESP, ALB FREE, SURECLICK) 301 SWF/0.9NA SOLN Inject 200 mcg into the skin every 7 (seven) days. Reported on 07/14/2015    . fentaNYL (DURAGESIC - DOSED MCG/HR) 12 MCG/HR Place 12.5 mcg onto the skin every 3 (three) days.    [name] Kitchen FOSRENOL 1000 MG PACK Take 1 packet by mouth 3 (three) times daily. Mix 1 packet with applesauce or similar food. Eat Immediately 3 times daily with meals  6  . insulin glargine (LANTUS) 100 UNIT/ML injection Inject 6 Units into the skin daily as needed (high blood sugar). If blood sugar is over 140    . midodrine (PROAMATINE) 10 MG tablet Take 10 mg by mouth 2 (two) times daily.    [name] Kitchen omeprazole (PRILOSEC) 20 MG capsule Take 20 mg by mouth daily.    [name] Kitchen OVER THE COUNTER MEDICATION Take 5 mLs by  mouth daily. nutra burst multivitamin liquid    . oxyCODONE-acetaminophen (ROXICET) 5-325 MG tablet Take 1-2 tablets by mouth every 4 (four) hours as needed. 30 tablet 0  . pravastatin (PRAVACHOL) 40 MG tablet Take 40 mg by mouth at bedtime.     . promethazine (PHENERGAN) 12.5 MG tablet Take 12.5 mg by mouth every 6 (six) hours as needed for nausea or vomiting.    . zinc sulfate 220 (50 Zn) MG capsule Take by mouth.    [name] Kitchen albuterol (PROVENTIL HFA;VENTOLIN HFA) 108 (90 Base) MCG/ACT inhaler Inhale 2 puffs into the lungs 2 (two) times daily as needed for wheezing or shortness of breath.      No current facility-administered medications on file prior to visit.    There are no Patient Instructions on file for this visit. No follow-ups on file.  Rosalena Mccorry A Haydin Dunn, PA-C

## 2017-07-07 ENCOUNTER — Telehealth (INDEPENDENT_AMBULATORY_CARE_PROVIDER_SITE_OTHER): Payer: Self-pay | Admitting: Vascular Surgery

## 2017-07-07 NOTE — Telephone Encounter (Signed)
Spoke with patient about her ultrasound results.  Right upper extremity venous duplex was notable for "no evidence of DVT within the right upper extremity, occluded right upper arm dialysis graft/fistula compatible with provided history, with associated adjacent age-indeterminate approximately 5.8 cm mixed echogenic fluid collection. Clinical correlation is advised. Indeterminate at least 9 cm minimally complex fluid collection with the antecubital fossa, nonspecific though potentially representative of a hematoma. Clinical correlation is advised. Incomplete evaluation of reported new brachial-basilic fistula".  Patient was advised to continue to elevate her arm heart level or higher.  Patient should follow-up in approximately 5 to 6 weeks for an HDA and to plan her second stage basilic transposition.

## 2017-08-02 ENCOUNTER — Encounter (INDEPENDENT_AMBULATORY_CARE_PROVIDER_SITE_OTHER): Payer: Self-pay | Admitting: Vascular Surgery

## 2017-08-02 ENCOUNTER — Ambulatory Visit (INDEPENDENT_AMBULATORY_CARE_PROVIDER_SITE_OTHER): Payer: Medicare Other | Admitting: Vascular Surgery

## 2017-08-02 VITALS — BP 117/76 | HR 93 | Resp 16 | Ht 64.0 in | Wt 235.0 lb

## 2017-08-02 DIAGNOSIS — N186 End stage renal disease: Secondary | ICD-10-CM

## 2017-08-02 DIAGNOSIS — T829XXS Unspecified complication of cardiac and vascular prosthetic device, implant and graft, sequela: Secondary | ICD-10-CM

## 2017-08-02 NOTE — Progress Notes (Signed)
Subjective:    Patient ID: [name], female    DOB: 29-Nov-1985, 32 y.o.   MRN: [id number] Chief Complaint  Patient presents with  . Follow-up    [facility] 5wk    Patient presents for her second post operative follow-up.  Patient last seen on July 16, 2017 for her first postoperative follow-up with the patient's chief complaint was right upper extremity swelling and pain.  The patient underwent a right upper extremity DVT study which was negative.  The patient presents today with improvement in the edema to the right upper extremity.  The patient is being maintained by a right IJ PermCath without complication.  The patient's incision is healing well.  Patient denies any issues with her incision.  Patient denies any fever, nausea vomiting.  Review of Systems  Constitutional: Negative.   HENT: Negative.   Eyes: Negative.   Respiratory: Negative.   Cardiovascular: Negative.   Gastrointestinal: Negative.   Endocrine: Negative.   Genitourinary:       ESRD  Musculoskeletal: Negative.   Skin: Negative.   Allergic/Immunologic: Negative.   Neurological: Negative.   Hematological: Negative.   Psychiatric/Behavioral: Negative.       Objective:   Physical Exam  Constitutional: She is oriented to person, place, and time. She appears well-developed and well-nourished. No distress.  HENT:  Head: Normocephalic and atraumatic.  Right Ear: External ear normal.  Left Ear: External ear normal.  Eyes: Pupils are equal, round, and reactive to light. Conjunctivae and EOM are normal.  Neck: Normal range of motion.  Cardiovascular: Normal rate, regular rhythm, normal heart sounds and intact distal pulses.  No murmur heard. Pulses:      Radial pulses are 2+ on the right side, and 2+ on the left side.  Right upper extremity brachial basilic AV fistula: Good the hard to palpate thrill.  Incision is healed.  Edema has significantly improved. Right IJ PermCath is intact clean and dry.  No signs of  infection noted.  Pulmonary/Chest: Effort normal and breath sounds normal. No stridor. No respiratory distress. She has no wheezes.  Abdominal: Soft. Bowel sounds are normal. She exhibits no distension. There is no tenderness. There is no guarding.  Musculoskeletal: Normal range of motion. She exhibits no edema.  Neurological: She is alert and oriented to person, place, and time.  Skin: Skin is warm and dry. She is not diaphoretic. No erythema. No pallor.  Psychiatric: She has a normal mood and affect. Her behavior is normal. Judgment and thought content normal.  Vitals reviewed.  BP 117/76 (BP Location: Left Arm)   Pulse 93   Resp 16   Ht 5\' 4"  (1.626 m)   Wt 235 lb (106.6 kg)   BMI 40.34 kg/m   Past Medical History:  Diagnosis Date  . Anemia   . Asthma   . Diabetes mellitus   . Dysrhythmia   . 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 GERD (gastroesophageal reflux disease)   . Hypertension   . Irregular heart beat   . Morbid obesity ([place])   . Renal insufficiency   . Seizures ([place])   . Sleep apnea    Social History   Socioeconomic History  . Marital status: Single    Spouse name: Not on file  . Number of children: Not on file  . Years of education: Not on file  . Highest education level: Not on file  Occupational History  . Not on file  Social Needs  .  Financial resource strain: Not on file  . Food insecurity:    Worry: Not on file    Inability: Not on file  . Transportation needs:    Medical: Not on file    Non-medical: Not on file  Tobacco Use  . Smoking status: Light Tobacco Smoker    Packs/day: 0.25    Years: 10.00    Pack years: 2.50    Types: Cigarettes  . Smokeless tobacco: Never Used  Substance and Sexual Activity  . Alcohol use: No  . Drug use: No  . Sexual activity: Yes    Birth control/protection: IUD  Lifestyle  . Physical activity:    Days per week: Not on file    Minutes per session: Not on file  . Stress: Not on file    Relationships  . Social connections:    Talks on phone: Not on file    Gets together: Not on file    Attends religious service: Not on file    Active member of club or organization: Not on file    Attends meetings of clubs or organizations: Not on file    Relationship status: Not on file  . Intimate partner violence:    Fear of current or ex partner: Not on file    Emotionally abused: Not on file    Physically abused: Not on file    Forced sexual activity: Not on file  Other Topics Concern  . Not on file  Social History Narrative  . Not on file   Past Surgical History:  Procedure Laterality Date  . AV FISTULA PLACEMENT  08/26/10   Left Radiocephalic AVF  . AV FISTULA PLACEMENT  08/26/2015   Procedure: ARTERIOVENOUS (AV) FISTULA CREATION;  Surgeon: Algernon Huxley, MD;  Location: [facility] ORS;  Service: Vascular;;  . AV FISTULA PLACEMENT Right 06/28/2017   Procedure: ARTERIOVENOUS (AV) FISTULA CREATION ( BRACHIAL BASILIC );  Surgeon: Algernon Huxley, MD;  Location: [facility];  Service: Vascular;  Laterality: Right;  . BACK SURGERY    . CARPAL TUNNEL RELEASE Left 05/26/2017   Procedure: CARPAL TUNNEL RELEASE;  Surgeon: Hessie Knows, MD;  Location: [facility];  Service: Orthopedics;  Laterality: Left;  . CARPAL TUNNEL RELEASE Left 06/09/2017   Procedure: CARPAL TUNNEL RELEASE;  Surgeon: Hessie Knows, MD;  Location: [facility];  Service: Orthopedics;  Laterality: Left;  . DIALYSIS/PERMA CATHETER REMOVAL N/A 03/07/2016   Procedure: Dialysis/Perma Catheter Removal;  Surgeon: Algernon Huxley, MD;  Location: [place] [facility];  Service: Cardiovascular;  Laterality: N/A;  . EYE SURGERY     laser photocoagulation  . EYE SURGERY Right 2008   removal of blood clot  . INTRAUTERINE DEVICE (IUD) INSERTION    . PERIPHERAL VASCULAR CATHETERIZATION N/A 07/07/2014   Procedure: A/V Shuntogram/Fistulagram;  Surgeon: Algernon Huxley, MD;  Location: [place] [facility];  Service: Cardiovascular;  Laterality: N/A;  .  PERIPHERAL VASCULAR CATHETERIZATION N/A 07/07/2014   Procedure: A/V Shunt Intervention;  Surgeon: Algernon Huxley, MD;  Location: [place] [facility];  Service: Cardiovascular;  Laterality: N/A;  . PERIPHERAL VASCULAR CATHETERIZATION Right 01/12/2015   Procedure: A/V Shuntogram/Fistulagram;  Surgeon: Algernon Huxley, MD;  Location: [place] [facility];  Service: Cardiovascular;  Laterality: Right;  . PERIPHERAL VASCULAR CATHETERIZATION N/A 01/12/2015   Procedure: A/V Shunt Intervention;  Surgeon: Algernon Huxley, MD;  Location: [place] [facility];  Service: Cardiovascular;  Laterality: N/A;  . PERIPHERAL VASCULAR CATHETERIZATION Right 06/08/2015   Procedure: A/V  Shuntogram/Fistulagram;  Surgeon: Algernon Huxley, MD;  Location: [place] [facility];  Service: Cardiovascular;  Laterality: Right;  . PERIPHERAL VASCULAR CATHETERIZATION N/A 06/08/2015   Procedure: A/V Shunt Intervention;  Surgeon: Algernon Huxley, MD;  Location: [place] [facility];  Service: Cardiovascular;  Laterality: N/A;  . PERIPHERAL VASCULAR CATHETERIZATION Right 07/14/2015   Procedure: Thrombectomy;  Surgeon: Katha Cabal, MD;  Location: [place] [facility];  Service: Cardiovascular;  Laterality: Right;  . PERIPHERAL VASCULAR CATHETERIZATION N/A 07/14/2015   Procedure: A/V Shuntogram/Fistulagram;  Surgeon: Katha Cabal, MD;  Location: [place] [facility];  Service: Cardiovascular;  Laterality: N/A;  . PERIPHERAL VASCULAR CATHETERIZATION N/A 07/17/2015   Procedure: Thrombectomy;  Surgeon: Katha Cabal, MD;  Location: [place] [facility];  Service: Cardiovascular;  Laterality: N/A;  . PERIPHERAL VASCULAR CATHETERIZATION N/A 07/17/2015   Procedure: Dialysis/Perma Catheter Insertion;  Surgeon: Katha Cabal, MD;  Location: [place] [facility];  Service: Cardiovascular;  Laterality: N/A;  . PERIPHERAL VASCULAR CATHETERIZATION N/A 08/05/2015   Procedure: Thrombectomy;  Surgeon: Katha Cabal, MD;  Location: [place] [facility];   Service: Cardiovascular;  Laterality: N/A;  declot of graft  . PERIPHERAL VASCULAR THROMBECTOMY N/A 05/10/2017   Procedure: PERIPHERAL VASCULAR THROMBECTOMY;  Surgeon: Algernon Huxley, MD;  Location: [place] [facility];  Service: Cardiovascular;  Laterality: N/A;  . TOE AMPUTATION     both big toes removed   Family History  Problem Relation Age of Onset  . Diabetes Mother   . Heart attack Mother   . Stroke Mother   . High Cholesterol Mother   . Diabetes Father    Allergies  Allergen Reactions  . Marcillin [Ampicillin] Rash and Other (See Comments)    Breathing and heart palp  Hot and sweating Has patient had a PCN reaction causing immediate rash, facial/tongue/throat swelling, SOB or lightheadedness with hypotension: Yes Has patient had a PCN reaction causing severe rash involving mucus membranes or skin necrosis: No Has patient had a PCN reaction that required hospitalization Unknown Has patient had a PCN reaction occurring within the last 10 years: Yes If all of the above answers are "NO", then may proceed with Cephalosporin use.   [name] Kitchen Penicillins Other (See Comments)    Heart races  . Latex Swelling and Rash  . Vancomycin Itching, Nausea Only and Rash    Thick pieces of skin pulls off.      Assessment & Plan:  Patient presents for her second post operative follow-up.  Patient last seen on July 16, 2017 for her first postoperative follow-up with the patient's chief complaint was right upper extremity swelling and pain.  The patient underwent a right upper extremity DVT study which was negative.  The patient presents today with improvement in the edema to the right upper extremity.  The patient is being maintained by a right IJ PermCath without complication.  The patient's incision is healing well.  Patient denies any issues with her incision.  Patient denies any fever, nausea vomiting.  1. End stage renal disease ([place]) - Stable Patient is doing well post operatively. Patient was  ruled out for right upper extremity DVT status post stage I of her brachial basilic AV fistula creation The incision has healed. Audible bruit minimally palpable She will need to undergo stage II basilic transposition Procedure, risks and benefits explained to the patient All questions answered Patient wishes to proceed The patient dialyzes Tuesdays Thursdays and Saturdays  2. Complication from renal dialysis  device, sequela - Stable As above  Current Outpatient Medications on File Prior to Visit  Medication Sig Dispense Refill  . aspirin 81 MG tablet Take 81 mg by mouth daily.      . B Complex-C-Folic Acid (RENA-VITE RX) 1 MG TABS Take by mouth.    . cinacalcet (SENSIPAR) 30 MG tablet Take 30 mg by mouth See admin instructions. Given at dialysis Tue Thu Sat    . darbepoetin (ARANESP, ALB FREE, SURECLICK) 762 UQJ/3.3LK SOLN Inject 200 mcg into the skin every 7 (seven) days. Reported on 07/14/2015    . fentaNYL (DURAGESIC - DOSED MCG/HR) 12 MCG/HR Place 12.5 mcg onto the skin every 3 (three) days.    [name] Kitchen FOSRENOL 1000 MG PACK Take 1 packet by mouth 3 (three) times daily. Mix 1 packet with applesauce or similar food. Eat Immediately 3 times daily with meals  6  . insulin glargine (LANTUS) 100 UNIT/ML injection Inject 6 Units into the skin daily as needed (high blood sugar). If blood sugar is over 140    . midodrine (PROAMATINE) 10 MG tablet Take 10 mg by mouth 2 (two) times daily.    [name] Kitchen omeprazole (PRILOSEC) 20 MG capsule Take 20 mg by mouth daily.    [name] Kitchen OVER THE COUNTER MEDICATION Take 5 mLs by mouth daily. nutra burst multivitamin liquid    . oxyCODONE-acetaminophen (ROXICET) 5-325 MG tablet Take 1-2 tablets by mouth every 4 (four) hours as needed. 30 tablet 0  . pravastatin (PRAVACHOL) 40 MG tablet Take 40 mg by mouth at bedtime.     . promethazine (PHENERGAN) 12.5 MG tablet Take 12.5 mg by mouth every 6 (six) hours as needed for nausea or vomiting.    . zinc sulfate 220 (50 Zn) MG capsule  Take by mouth.    [name] Kitchen albuterol (PROVENTIL HFA;VENTOLIN HFA) 108 (90 Base) MCG/ACT inhaler Inhale 2 puffs into the lungs 2 (two) times daily as needed for wheezing or shortness of breath.      No current facility-administered medications on file prior to visit.    There are no Patient Instructions on file for this visit. No follow-ups on file.  Gerrie Castiglia A Honora Searson, PA-C

## 2017-08-07 ENCOUNTER — Other Ambulatory Visit (INDEPENDENT_AMBULATORY_CARE_PROVIDER_SITE_OTHER): Payer: Self-pay | Admitting: Vascular Surgery

## 2017-08-07 ENCOUNTER — Encounter (INDEPENDENT_AMBULATORY_CARE_PROVIDER_SITE_OTHER): Payer: Self-pay

## 2017-08-09 ENCOUNTER — Encounter
Admission: RE | Admit: 2017-08-09 | Discharge: 2017-08-09 | Disposition: A | Discharge status: Home / Self Care | Payer: Medicare Other | Source: Ambulatory Visit | Attending: Vascular Surgery | Admitting: Vascular Surgery

## 2017-08-09 ENCOUNTER — Other Ambulatory Visit: Payer: Self-pay

## 2017-08-09 DIAGNOSIS — Z01812 Encounter for preprocedural laboratory examination: Secondary | ICD-10-CM | POA: Diagnosis not present

## 2017-08-09 DIAGNOSIS — N186 End stage renal disease: Secondary | ICD-10-CM | POA: Diagnosis not present

## 2017-08-09 LAB — SURGICAL PCR SCREEN
MRSA, PCR: NEGATIVE
Staphylococcus aureus: NEGATIVE

## 2017-08-09 NOTE — Pre-Procedure Instructions (Signed)
Unable to draw labs today. Unable to use RUE d/t scheduled surgery. Poor venous access LUE. Reordered pre-op lab draw for DOS.

## 2017-08-09 NOTE — Patient Instructions (Signed)
  Your procedure is scheduled on: Wednesday August 16, 2017 Report to Same Day Surgery 2nd floor medical mall ([place] Entrance-take elevator on left to 2nd floor.  Check in with surgery information desk.) To find out your arrival time please call [phone] between 1PM - 3PM on Tuesday August 15, 2017  Remember: Instructions that are not followed completely may result in serious medical risk, up to and including death, or upon the discretion of your surgeon and anesthesiologist your surgery may need to be rescheduled.    _x___ 1. Do not eat food, including mints, candies, or chewing gum after midnight the night before your procedure. You may drink water up to 2 hours before you are scheduled to arrive at the hospital for your procedure.  Do not drink anything within 2 hours of your scheduled arrival to the hospital.     __x__ 2. No Alcohol for 24 hours before or after surgery.   __x__3. No Smoking or e-cigarettes for 24 prior to surgery.  Do not use any chewable tobacco products for at least 6 hour prior to surgery   ____  4. Bring all medications with you on the day of surgery if instructed.    __x__ 5. Notify your doctor if there is any change in your medical condition     (cold, fever, infections).   __x__6. On the morning of surgery brush your teeth with toothpaste and water.  You may rinse your mouth with mouth wash if you wish.  Do not swallow any toothpaste or mouthwash.   Do not wear jewelry, make-up, hairpins, clips or nail polish.  Do not wear lotions, powders, deodorant, or perfumes.   Do not shave 48 hours prior to surgery.   Do not bring valuables to the hospital.    [facility] is not responsible for any belongings or valuables.               Contacts, dentures or bridgework may not be worn into surgery.  Leave your suitcase in the car. After surgery it may be brought to your room.  For patients admitted to the hospital, discharge time is determined by your treatment  team.  For patients discharged on the day of surgery, you will not be permitted to drive yourself home.  Please read over the following fact sheets that you were given:   [facility] Preparing for Surgery and or MRSA Information   _x___ Take anti-hypertensive listed below, cardiac, seizure, asthma, anti-reflux and psychiatric medicines. These include:  1. Omeprazole/Prilosec   _x___ Use CHG Soap or sage wipes as directed on instruction sheet   _x___ Use inhalers on the day of surgery and bring to hospital day of surgery   _x___ Take 1/2 of usual insulin dose the night before surgery and none on the morning of surgery.   _x___ Follow recommendations from Cardiologist, Pulmonologist or PCP regarding stopping Aspirin, Coumadin, Plavix ,Eliquis, Effient, or Pradaxa, and Pletal.  _x___ Stop Anti-inflammatories such as Advil, Aleve, Ibuprofen, Motrin, Naproxen, Naprosyn, Goodies powders or aspirin products. OK to take Tylenol and Celebrex.   _x___ Stop supplements until after surgery.  But may continue Vitamin D, Vitamin B, and multivitamin.

## 2017-08-15 MED ORDER — MIDAZOLAM HCL 2 MG/2ML IJ SOLN: 2.0000 mg | Freq: Once | INTRAMUSCULAR | Status: DC

## 2017-08-15 MED ORDER — FENTANYL CITRATE (PF) 100 MCG/2ML IJ SOLN: 100.0000 ug | Freq: Once | INTRAMUSCULAR | Status: DC

## 2017-08-16 ENCOUNTER — Other Ambulatory Visit: Payer: Self-pay

## 2017-08-16 ENCOUNTER — Ambulatory Visit
Admission: RE | Admit: 2017-08-16 | Discharge: 2017-08-16 | Disposition: A | Discharge status: Home / Self Care | Payer: Medicare Other | Source: Ambulatory Visit | Attending: Vascular Surgery | Admitting: Vascular Surgery

## 2017-08-16 ENCOUNTER — Encounter
Admission: RE | Disposition: A | Discharge status: Home / Self Care | Payer: Self-pay | Source: Ambulatory Visit | Attending: Vascular Surgery

## 2017-08-16 ENCOUNTER — Ambulatory Visit: Payer: Medicare Other | Admitting: Anesthesiology

## 2017-08-16 DIAGNOSIS — Z992 Dependence on renal dialysis: Secondary | ICD-10-CM | POA: Insufficient documentation

## 2017-08-16 DIAGNOSIS — I120 Hypertensive chronic kidney disease with stage 5 chronic kidney disease or end stage renal disease: Secondary | ICD-10-CM | POA: Diagnosis not present

## 2017-08-16 DIAGNOSIS — D631 Anemia in chronic kidney disease: Secondary | ICD-10-CM | POA: Diagnosis not present

## 2017-08-16 DIAGNOSIS — F17210 Nicotine dependence, cigarettes, uncomplicated: Secondary | ICD-10-CM | POA: Insufficient documentation

## 2017-08-16 DIAGNOSIS — G4730 Sleep apnea, unspecified: Secondary | ICD-10-CM | POA: Insufficient documentation

## 2017-08-16 DIAGNOSIS — Z6841 Body Mass Index (BMI) 40.0 and over, adult: Secondary | ICD-10-CM | POA: Insufficient documentation

## 2017-08-16 DIAGNOSIS — N186 End stage renal disease: Secondary | ICD-10-CM

## 2017-08-16 DIAGNOSIS — K219 Gastro-esophageal reflux disease without esophagitis: Secondary | ICD-10-CM | POA: Insufficient documentation

## 2017-08-16 DIAGNOSIS — J45909 Unspecified asthma, uncomplicated: Secondary | ICD-10-CM | POA: Insufficient documentation

## 2017-08-16 DIAGNOSIS — I129 Hypertensive chronic kidney disease with stage 1 through stage 4 chronic kidney disease, or unspecified chronic kidney disease: Secondary | ICD-10-CM | POA: Diagnosis present

## 2017-08-16 HISTORY — PX: BASCILIC VEIN TRANSPOSITION: SHX5742

## 2017-08-16 LAB — PROTIME-INR
INR: 1.05
PROTHROMBIN TIME: 13.6 s (ref 11.4–15.2)

## 2017-08-16 LAB — BASIC METABOLIC PANEL
ANION GAP: 12 (ref 5–15)
BUN: 38 mg/dL — ABNORMAL HIGH (ref 6–20)
CALCIUM: 8 mg/dL — AB (ref 8.9–10.3)
CO2: 28 mmol/L (ref 22–32)
CREATININE: 8.28 mg/dL — AB (ref 0.44–1.00)
Chloride: 99 mmol/L (ref 98–111)
GFR calc non Af Amer: 6 mL/min — ABNORMAL LOW (ref >60)
GFR, EST AFRICAN AMERICAN: 7 mL/min — AB (ref >60)
Glucose, Bld: 111 mg/dL — ABNORMAL HIGH (ref 70–99)
Potassium: 4.3 mmol/L (ref 3.5–5.1)
Sodium: 139 mmol/L (ref 135–145)

## 2017-08-16 LAB — DIFFERENTIAL
Basophils Absolute: 0.1 10*3/uL (ref 0–0.1)
Basophils Relative: 1 %
Eosinophils Absolute: 0.6 10*3/uL (ref 0–0.7)
Eosinophils Relative: 7 %
LYMPHS PCT: 26 %
Lymphs Abs: 2.1 10*3/uL (ref 1.0–3.6)
Monocytes Absolute: 0.7 10*3/uL (ref 0.2–0.9)
Monocytes Relative: 8 %
NEUTROS ABS: 4.8 10*3/uL (ref 1.4–6.5)
NEUTROS PCT: 58 %

## 2017-08-16 LAB — TYPE AND SCREEN
ABO/RH(D): O POS
ANTIBODY SCREEN: NEGATIVE

## 2017-08-16 LAB — CBC
HCT: 37.7 % (ref 35.0–47.0)
Hemoglobin: 12.3 g/dL (ref 12.0–16.0)
MCH: 29.5 pg (ref 26.0–34.0)
MCHC: 32.7 g/dL (ref 32.0–36.0)
MCV: 90.3 fL (ref 80.0–100.0)
PLATELETS: 202 10*3/uL (ref 150–440)
RBC: 4.18 MIL/uL (ref 3.80–5.20)
RDW: 16.5 % — ABNORMAL HIGH (ref 11.5–14.5)
WBC: 8.3 10*3/uL (ref 3.6–11.0)

## 2017-08-16 LAB — HCG, QUANTITATIVE, PREGNANCY: hCG, Beta Chain, Quant, S: 6 m[IU]/mL — ABNORMAL HIGH (ref <5)

## 2017-08-16 LAB — GLUCOSE, CAPILLARY
Glucose-Capillary: 105 mg/dL — ABNORMAL HIGH (ref 70–99)
Glucose-Capillary: 80 mg/dL (ref 70–99)

## 2017-08-16 LAB — APTT: aPTT: 30 seconds (ref 24–36)

## 2017-08-16 SURGERY — TRANSPOSITION, VEIN, BASILIC
Anesthesia: General | Site: Arm Upper | Laterality: Right | Wound class: Clean

## 2017-08-16 MED ORDER — ALBUMIN HUMAN 5 % IV SOLN
12.5000 g | Freq: Once | INTRAVENOUS | Status: AC
  Administered 2017-08-16: 12.5 g via INTRAVENOUS

## 2017-08-16 MED ORDER — SODIUM CHLORIDE 0.9 % IJ SOLN
INTRAMUSCULAR | Status: AC
  Filled 2017-08-16: qty 10

## 2017-08-16 MED ORDER — CHLORHEXIDINE GLUCONATE CLOTH 2 % EX PADS: 6.0000 | MEDICATED_PAD | Freq: Once | CUTANEOUS | Status: DC

## 2017-08-16 MED ORDER — SODIUM CHLORIDE 0.9 % IV SOLN
10.0000 ug/min | INTRAVENOUS | Status: DC
  Administered 2017-08-16: 10 ug/min via INTRAVENOUS
  Filled 2017-08-16: qty 2

## 2017-08-16 MED ORDER — OXYCODONE-ACETAMINOPHEN 5-325 MG PO TABS
1.0000 | ORAL_TABLET | ORAL | Status: DC | PRN
  Administered 2017-08-16: 2 via ORAL

## 2017-08-16 MED ORDER — FAMOTIDINE 20 MG PO TABS
ORAL_TABLET | ORAL | Status: AC
  Filled 2017-08-16: qty 1

## 2017-08-16 MED ORDER — FENTANYL CITRATE (PF) 250 MCG/5ML IJ SOLN
INTRAMUSCULAR | Status: AC
Start: 2017-08-16 — End: ?
  Filled 2017-08-16: qty 5

## 2017-08-16 MED ORDER — CLINDAMYCIN PHOSPHATE 900 MG/50ML IV SOLN
INTRAVENOUS | Status: AC
  Filled 2017-08-16: qty 50

## 2017-08-16 MED ORDER — FENTANYL CITRATE (PF) 100 MCG/2ML IJ SOLN
INTRAMUSCULAR | Status: DC | PRN
  Administered 2017-08-16: 25 ug via INTRAVENOUS
  Administered 2017-08-16: 100 ug via INTRAVENOUS
  Administered 2017-08-16 (×2): 50 ug via INTRAVENOUS
  Administered 2017-08-16: 25 ug via INTRAVENOUS

## 2017-08-16 MED ORDER — GLYCOPYRROLATE 0.2 MG/ML IJ SOLN
INTRAMUSCULAR | Status: AC
  Filled 2017-08-16: qty 1

## 2017-08-16 MED ORDER — ACETAMINOPHEN 10 MG/ML IV SOLN
INTRAVENOUS | Status: AC
  Filled 2017-08-16: qty 100

## 2017-08-16 MED ORDER — EVICEL 2 ML EX KIT
PACK | CUTANEOUS | Status: AC
  Filled 2017-08-16: qty 1

## 2017-08-16 MED ORDER — SODIUM CHLORIDE 0.9 % IV SOLN
INTRAVENOUS | Status: DC | PRN
  Administered 2017-08-16: 300 mL via INTRAMUSCULAR

## 2017-08-16 MED ORDER — SEVOFLURANE IN SOLN
RESPIRATORY_TRACT | Status: AC
  Filled 2017-08-16: qty 250

## 2017-08-16 MED ORDER — PHENYLEPHRINE HCL 10 MG/ML IJ SOLN
INTRAMUSCULAR | Status: AC
  Filled 2017-08-16: qty 1

## 2017-08-16 MED ORDER — HYDROMORPHONE HCL 1 MG/ML IJ SOLN
0.2000 mg | INTRAMUSCULAR | Status: DC | PRN
Start: 2017-08-16 — End: 2017-08-16
  Administered 2017-08-16 (×3): 0.2 mg via INTRAVENOUS

## 2017-08-16 MED ORDER — LIDOCAINE HCL (CARDIAC) PF 100 MG/5ML IV SOSY
PREFILLED_SYRINGE | INTRAVENOUS | Status: DC | PRN
  Administered 2017-08-16: 100 mg via INTRAVENOUS

## 2017-08-16 MED ORDER — ARTIFICIAL TEARS OPHTHALMIC OINT
TOPICAL_OINTMENT | OPHTHALMIC | Status: AC
  Filled 2017-08-16: qty 3.5

## 2017-08-16 MED ORDER — GLYCOPYRROLATE 0.2 MG/ML IJ SOLN
INTRAMUSCULAR | Status: DC | PRN
  Administered 2017-08-16: 0.2 mg via INTRAVENOUS

## 2017-08-16 MED ORDER — KETOROLAC TROMETHAMINE 30 MG/ML IJ SOLN
INTRAMUSCULAR | Status: AC
  Filled 2017-08-16: qty 1

## 2017-08-16 MED ORDER — ONDANSETRON HCL 4 MG/2ML IJ SOLN
INTRAMUSCULAR | Status: AC
  Filled 2017-08-16: qty 2

## 2017-08-16 MED ORDER — DIPHENHYDRAMINE HCL 50 MG/ML IJ SOLN
INTRAMUSCULAR | Status: AC
  Filled 2017-08-16: qty 1

## 2017-08-16 MED ORDER — BUPIVACAINE HCL (PF) 0.5 % IJ SOLN
INTRAMUSCULAR | Status: AC
  Filled 2017-08-16: qty 30

## 2017-08-16 MED ORDER — ACETAMINOPHEN 10 MG/ML IV SOLN
INTRAVENOUS | Status: DC | PRN
  Administered 2017-08-16: 1000 mg via INTRAVENOUS

## 2017-08-16 MED ORDER — PHENYLEPHRINE HCL 10 MG/ML IJ SOLN
INTRAMUSCULAR | Status: DC | PRN
  Administered 2017-08-16 (×6): 200 ug via INTRAVENOUS

## 2017-08-16 MED ORDER — FENTANYL CITRATE (PF) 100 MCG/2ML IJ SOLN
INTRAMUSCULAR | Status: AC
  Filled 2017-08-16: qty 2

## 2017-08-16 MED ORDER — EVICEL 2 ML EX KIT
PACK | CUTANEOUS | Status: DC | PRN
  Administered 2017-08-16: 2 mL

## 2017-08-16 MED ORDER — HYDROMORPHONE HCL 1 MG/ML IJ SOLN
INTRAMUSCULAR | Status: AC
  Administered 2017-08-16: 0.2 mg via INTRAVENOUS
  Filled 2017-08-16: qty 1

## 2017-08-16 MED ORDER — SODIUM CHLORIDE 0.9 % IV SOLN
INTRAVENOUS | Status: DC | PRN
  Administered 2017-08-16: 25 ug/min via INTRAVENOUS

## 2017-08-16 MED ORDER — DIPHENHYDRAMINE HCL 50 MG/ML IJ SOLN
INTRAMUSCULAR | Status: DC | PRN
  Administered 2017-08-16: 20 mg via INTRAVENOUS

## 2017-08-16 MED ORDER — CLINDAMYCIN PHOSPHATE 900 MG/50ML IV SOLN
900.0000 mg | INTRAVENOUS | Status: AC
  Administered 2017-08-16: 900 mg via INTRAVENOUS

## 2017-08-16 MED ORDER — OXYCODONE HCL 5 MG/5ML PO SOLN: 5.0000 mg | Freq: Once | ORAL | Status: DC | PRN

## 2017-08-16 MED ORDER — LIDOCAINE HCL (PF) 2 % IJ SOLN
INTRAMUSCULAR | Status: AC
  Filled 2017-08-16: qty 10

## 2017-08-16 MED ORDER — HEPARIN SODIUM (PORCINE) 1000 UNIT/ML IJ SOLN
INTRAMUSCULAR | Status: DC | PRN
  Administered 2017-08-16: 3000 [IU] via INTRAVENOUS

## 2017-08-16 MED ORDER — SODIUM CHLORIDE FLUSH 0.9 % IV SOLN
INTRAVENOUS | Status: AC
  Filled 2017-08-16: qty 10

## 2017-08-16 MED ORDER — HEPARIN SODIUM (PORCINE) 1000 UNIT/ML IJ SOLN
3000.0000 [IU] | Freq: Once | INTRAMUSCULAR | Status: AC
  Administered 2017-08-16: 2000 [IU]
  Filled 2017-08-16: qty 3

## 2017-08-16 MED ORDER — SODIUM CHLORIDE 0.9 % IV SOLN
INTRAVENOUS | Status: DC
  Administered 2017-08-16 (×2): via INTRAVENOUS

## 2017-08-16 MED ORDER — OXYCODONE-ACETAMINOPHEN 5-325 MG PO TABS
ORAL_TABLET | ORAL | Status: AC
  Administered 2017-08-16: 2 via ORAL
  Filled 2017-08-16: qty 2

## 2017-08-16 MED ORDER — HEPARIN SODIUM (PORCINE) 1000 UNIT/ML IJ SOLN
INTRAMUSCULAR | Status: AC
  Filled 2017-08-16: qty 1

## 2017-08-16 MED ORDER — KETAMINE HCL 10 MG/ML IJ SOLN
INTRAMUSCULAR | Status: DC | PRN
  Administered 2017-08-16: 20 mg via INTRAVENOUS
  Administered 2017-08-16: 30 mg via INTRAVENOUS

## 2017-08-16 MED ORDER — HYDROMORPHONE HCL 1 MG/ML IJ SOLN
INTRAMUSCULAR | Status: AC
  Filled 2017-08-16: qty 1

## 2017-08-16 MED ORDER — HEPARIN SODIUM (PORCINE) 5000 UNIT/ML IJ SOLN
INTRAMUSCULAR | Status: AC
  Filled 2017-08-16: qty 1

## 2017-08-16 MED ORDER — KETAMINE HCL 50 MG/ML IJ SOLN
INTRAMUSCULAR | Status: AC
  Filled 2017-08-16: qty 10

## 2017-08-16 MED ORDER — OXYCODONE-ACETAMINOPHEN 5-325 MG PO TABS: 1.0000 | ORAL_TABLET | ORAL | 0 refills | Status: AC | PRN

## 2017-08-16 MED ORDER — PROPOFOL 10 MG/ML IV BOLUS
INTRAVENOUS | Status: DC | PRN
  Administered 2017-08-16: 150 mg via INTRAVENOUS

## 2017-08-16 MED ORDER — ALBUMIN HUMAN 5 % IV SOLN
INTRAVENOUS | Status: AC
  Administered 2017-08-16: 12.5 g via INTRAVENOUS
  Filled 2017-08-16: qty 250

## 2017-08-16 MED ORDER — PROPOFOL 10 MG/ML IV BOLUS
INTRAVENOUS | Status: AC
  Filled 2017-08-16: qty 40

## 2017-08-16 MED ORDER — ONDANSETRON HCL 4 MG/2ML IJ SOLN
INTRAMUSCULAR | Status: DC | PRN
  Administered 2017-08-16: 4 mg via INTRAVENOUS

## 2017-08-16 MED ORDER — OXYCODONE HCL 5 MG PO TABS: 5.0000 mg | ORAL_TABLET | Freq: Once | ORAL | Status: DC | PRN

## 2017-08-16 MED ORDER — HYDROMORPHONE HCL 1 MG/ML IJ SOLN
0.2000 mg | INTRAMUSCULAR | Status: DC | PRN
  Administered 2017-08-16: 1 mg via INTRAVENOUS

## 2017-08-16 SURGICAL SUPPLY — 59 items
APPLIER CLIP 11 MED OPEN (CLIP)
APPLIER CLIP 9.375 SM OPEN (CLIP)
BAG DECANTER FOR FLEXI CONT (MISCELLANEOUS) ×3 IMPLANT
BLADE SURG 15 STRL LF DISP TIS (BLADE) ×1 IMPLANT
BLADE SURG 15 STRL SS (BLADE) ×2
BLADE SURG SZ11 CARB STEEL (BLADE) ×3 IMPLANT
BOOT SUTURE AID YELLOW STND (SUTURE) ×3 IMPLANT
BRUSH SCRUB EZ  4% CHG (MISCELLANEOUS) ×2
BRUSH SCRUB EZ 4% CHG (MISCELLANEOUS) ×1 IMPLANT
CANISTER SUCT 1200ML W/VALVE (MISCELLANEOUS) ×3 IMPLANT
CHLORAPREP W/TINT 26ML (MISCELLANEOUS) ×3 IMPLANT
CLIP APPLIE 11 MED OPEN (CLIP) IMPLANT
CLIP APPLIE 9.375 SM OPEN (CLIP) IMPLANT
DECANTER SPIKE VIAL GLASS SM (MISCELLANEOUS) ×3 IMPLANT
DERMABOND ADVANCED (GAUZE/BANDAGES/DRESSINGS) ×4
DERMABOND ADVANCED .7 DNX12 (GAUZE/BANDAGES/DRESSINGS) ×2 IMPLANT
DRESSING SURGICEL FIBRLLR 1X2 (HEMOSTASIS) ×1 IMPLANT
DRSG SURGICEL FIBRILLAR 1X2 (HEMOSTASIS) ×3
ELECT CAUTERY BLADE 6.4 (BLADE) ×3 IMPLANT
ELECT REM PT RETURN 9FT ADLT (ELECTROSURGICAL) ×3
ELECTRODE REM PT RTRN 9FT ADLT (ELECTROSURGICAL) ×1 IMPLANT
GEL ULTRASOUND 20GR AQUASONIC (MISCELLANEOUS) ×3 IMPLANT
GLOVE BIO SURGEON STRL SZ7 (GLOVE) ×3 IMPLANT
GLOVE INDICATOR 7.5 STRL GRN (GLOVE) ×3 IMPLANT
GLOVE SURG SYN 8.0 (GLOVE) ×3 IMPLANT
GOWN L4 XLG 20 PK N/S (GOWN DISPOSABLE) ×6 IMPLANT
GOWN STRL REUS W/ TWL LRG LVL3 (GOWN DISPOSABLE) ×1 IMPLANT
GOWN STRL REUS W/TWL LRG LVL3 (GOWN DISPOSABLE) ×2
IV NS 500ML (IV SOLUTION) ×2
IV NS 500ML BAXH (IV SOLUTION) ×1 IMPLANT
KIT TURNOVER KIT A (KITS) ×3 IMPLANT
LABEL OR SOLS (LABEL) ×3 IMPLANT
LOOP RED MAXI  1X406MM (MISCELLANEOUS) ×2
LOOP VESSEL MAXI 1X406 RED (MISCELLANEOUS) ×1 IMPLANT
LOOP VESSEL MINI 0.8X406 BLUE (MISCELLANEOUS) ×2 IMPLANT
LOOPS BLUE MINI 0.8X406MM (MISCELLANEOUS) ×4
NEEDLE FILTER BLUNT 18X 1/2SAF (NEEDLE) ×2
NEEDLE FILTER BLUNT 18X1 1/2 (NEEDLE) ×1 IMPLANT
PACK EXTREMITY ARMC (MISCELLANEOUS) ×3 IMPLANT
PAD PREP 24X41 OB/GYN DISP (PERSONAL CARE ITEMS) ×3 IMPLANT
SPONGE XRAY 4X4 16PLY STRL (MISCELLANEOUS) ×3 IMPLANT
STOCKINETTE ORTHO 4X25 (MISCELLANEOUS) ×3 IMPLANT
STOCKINETTE STRL 4IN 9604848 (GAUZE/BANDAGES/DRESSINGS) ×3 IMPLANT
SUT MNCRL 3 0 RB1 (SUTURE) ×1 IMPLANT
SUT MNCRL+ 5-0 UNDYED PC-3 (SUTURE) ×2 IMPLANT
SUT MONOCRYL 3 0 RB1 (SUTURE) ×2
SUT MONOCRYL 5-0 (SUTURE) ×4
SUT PROLENE 6 0 BV (SUTURE) ×21 IMPLANT
SUT SILK 2 0 (SUTURE) ×2
SUT SILK 2 0 SH (SUTURE) ×3 IMPLANT
SUT SILK 2-0 18XBRD TIE 12 (SUTURE) ×1 IMPLANT
SUT SILK 3 0 (SUTURE) ×2
SUT SILK 3-0 18XBRD TIE 12 (SUTURE) ×1 IMPLANT
SUT SILK 4 0 (SUTURE) ×2
SUT SILK 4-0 18XBRD TIE 12 (SUTURE) ×1 IMPLANT
SUT VIC AB 3-0 SH 27 (SUTURE) ×6
SUT VIC AB 3-0 SH 27X BRD (SUTURE) ×3 IMPLANT
SYR 20CC LL (SYRINGE) ×3 IMPLANT
SYR 3ML LL SCALE MARK (SYRINGE) ×3 IMPLANT

## 2017-08-16 NOTE — Transfer of Care (Signed)
Immediate Anesthesia Transfer of Care Note  Patient: [name]  Procedure(s) Performed: BASCILIC VEIN TRANSPOSITION (2ND STAGE UPPER) (Right Arm Upper)  Patient Location: PACU  Anesthesia Type:General  Level of Consciousness: awake, alert , oriented and patient cooperative  Airway & Oxygen Therapy: Patient Spontanous Breathing and Patient connected to face mask oxygen  Post-op Assessment: Report given to RN, Post -op Vital signs reviewed and stable and Patient moving all extremities  Post vital signs: Reviewed and stable  Last Vitals:  Vitals Value Taken Time  BP 160/84 08/16/2017 10:18 AM  Temp    Pulse 90 08/16/2017 10:18 AM  Resp 6 08/16/2017 10:18 AM  SpO2 98 % 08/16/2017 10:18 AM  Vitals shown include unvalidated device data.  Last Pain:  Vitals:   08/16/17 0602  TempSrc: Temporal  PainSc: 3          Complications: No apparent anesthesia complications

## 2017-08-16 NOTE — H&P (Signed)
[facility] History & Physical Update  The patient was interviewed and re-examined.  The patient's previous History and Physical has been reviewed and is unchanged.  There is no change in the plan of care. We plan to proceed with the scheduled procedure.  Leotis Pain, MD  08/16/2017, 7:26 AM

## 2017-08-16 NOTE — Discharge Instructions (Addendum)
  AMBULATORY SURGERY  DISCHARGE INSTRUCTIONS   1) The drugs that you were given will stay in your system until tomorrow so for the next 24 hours you should not:  A) Drive an automobile B) Make any legal decisions C) Drink any alcoholic beverage   2) You may resume regular meals tomorrow.  Today it is better to start with liquids and gradually work up to solid foods.  You may eat anything you prefer, but it is better to start with liquids, then soup and crackers, and gradually work up to solid foods.   3) Please notify your doctor immediately if you have any unusual bleeding, trouble breathing, redness and pain at the surgery site, drainage, fever, or pain not relieved by medication.    4) Additional Instructions: TAKE A STOOL SOFTENER TWICE A DAY WHILE TAKING NARCOTIC PAIN MEDICINE TO PREVENT CONSTIPATION   Please contact your physician with any problems or Same Day Surgery at [phone], Monday through Friday 6 am to 4 pm, or [place] at [place] [facility] number at [phone].   

## 2017-08-16 NOTE — Anesthesia Post-op Follow-up Note (Signed)
Anesthesia QCDR form completed.        

## 2017-08-16 NOTE — Anesthesia Procedure Notes (Signed)
Procedure Name: LMA Insertion Date/Time: 08/16/2017 7:41 AM Performed by: Nile Riggs, CRNA Pre-anesthesia Checklist: Patient identified, Emergency Drugs available, Suction available, Patient being monitored and Timeout performed Patient Re-evaluated:Patient Re-evaluated prior to induction Oxygen Delivery Method: Circle system utilized Preoxygenation: Pre-oxygenation with 100% oxygen Induction Type: IV induction Ventilation: Mask ventilation without difficulty LMA: LMA inserted LMA Size: 3.5 Number of attempts: 1 Placement Confirmation: positive ETCO2,  CO2 detector and breath sounds checked- equal and bilateral Tube secured with: Tape Dental Injury: Teeth and Oropharynx as per pre-operative assessment  Comments: Atraumatic seating of AirQ LMA.

## 2017-08-16 NOTE — Anesthesia Preprocedure Evaluation (Signed)
Anesthesia Evaluation  Patient identified by MRN, date of birth, ID band Patient awake    Reviewed: Allergy & Precautions, H&P , NPO status , Patient's Chart, lab work & pertinent test results  Airway Mallampati: III  TM Distance: >3 FB Neck ROM: full    Dental no notable dental hx.    Pulmonary neg pulmonary ROS, asthma , sleep apnea , Current Smoker,    breath sounds clear to auscultation       Cardiovascular hypertension, negative cardio ROS Normal cardiovascular exam+ dysrhythmias  Rhythm:Regular Rate:Normal     Neuro/Psych negative neurological ROS  negative psych ROS   GI/Hepatic negative GI ROS, Neg liver ROS, GERD  ,  Endo/Other  negative endocrine ROSdiabetes  Renal/GU Renal disease     Musculoskeletal   Abdominal   Peds  Hematology negative hematology ROS (+) anemia ,   Anesthesia Other Findings Past Medical History: No date: Anemia No date: Asthma No date: Diabetes mellitus No date: Dysrhythmia Jun 17 2015: Gastric bypass status for obesity No date: GERD (gastroesophageal reflux disease) No date: Hypertension No date: Irregular heart beat No date: Morbid obesity ([place]) No date: Renal insufficiency No date: Seizures ([place]) No date: Sleep apnea  Past Surgical History: 08/26/10: AV FISTULA PLACEMENT     Comment:  Left Radiocephalic AVF 04/23/5571: AV FISTULA PLACEMENT     Comment:  Procedure: ARTERIOVENOUS (AV) FISTULA CREATION;                Surgeon: Algernon Huxley, MD;  Location: [facility];  Service:               Vascular;; 06/28/2017: AV FISTULA PLACEMENT; Right     Comment:  Procedure: ARTERIOVENOUS (AV) FISTULA CREATION (               BRACHIAL BASILIC );  Surgeon: Algernon Huxley, MD;                Location: [facility];  Service: Vascular;  Laterality:               Right; No date: BACK SURGERY 05/26/2017: CARPAL TUNNEL RELEASE; Left     Comment:  Procedure: CARPAL TUNNEL RELEASE;  Surgeon: Hessie Knows, MD;  Location: [facility];  Service: Orthopedics;               Laterality: Left; 06/09/2017: CARPAL TUNNEL RELEASE; Left     Comment:  Procedure: CARPAL TUNNEL RELEASE;  Surgeon: Hessie Knows, MD;  Location: [facility];  Service: Orthopedics;               Laterality: Left; 03/07/2016: DIALYSIS/PERMA CATHETER REMOVAL; N/A     Comment:  Procedure: Dialysis/Perma Catheter Removal;  Surgeon:               Algernon Huxley, MD;  Location: [place] [facility];                Service: Cardiovascular;  Laterality: N/A; No date: EYE SURGERY     Comment:  laser photocoagulation 2008: EYE SURGERY; Right     Comment:  removal of blood clot No date: INTRAUTERINE DEVICE (IUD) INSERTION 07/07/2014: PERIPHERAL VASCULAR CATHETERIZATION; N/A     Comment:  Procedure: A/V Shuntogram/Fistulagram;  Surgeon: Erskine Squibb  Lucky Cowboy, MD;  Location: [place] [facility];  Service:               Cardiovascular;  Laterality: N/A; 07/07/2014: PERIPHERAL VASCULAR CATHETERIZATION; N/A     Comment:  Procedure: A/V Shunt Intervention;  Surgeon: Algernon Huxley, MD;  Location: [place] [facility];  Service:               Cardiovascular;  Laterality: N/A; 01/12/2015: PERIPHERAL VASCULAR CATHETERIZATION; Right     Comment:  Procedure: A/V Shuntogram/Fistulagram;  Surgeon: Algernon Huxley, MD;  Location: [place] [facility];  Service:               Cardiovascular;  Laterality: Right; 01/12/2015: PERIPHERAL VASCULAR CATHETERIZATION; N/A     Comment:  Procedure: A/V Shunt Intervention;  Surgeon: Algernon Huxley, MD;  Location: [place] [facility];  Service:               Cardiovascular;  Laterality: N/A; 06/08/2015: PERIPHERAL VASCULAR CATHETERIZATION; Right     Comment:  Procedure: A/V Shuntogram/Fistulagram;  Surgeon: Algernon Huxley, MD;  Location: [place] [facility];  Service:               Cardiovascular;  Laterality: Right; 06/08/2015: PERIPHERAL  VASCULAR CATHETERIZATION; N/A     Comment:  Procedure: A/V Shunt Intervention;  Surgeon: Algernon Huxley, MD;  Location: [place] [facility];  Service:               Cardiovascular;  Laterality: N/A; 07/14/2015: PERIPHERAL VASCULAR CATHETERIZATION; Right     Comment:  Procedure: Thrombectomy;  Surgeon: Katha Cabal,               MD;  Location: [place] [facility];  Service:               Cardiovascular;  Laterality: Right; 07/14/2015: PERIPHERAL VASCULAR CATHETERIZATION; N/A     Comment:  Procedure: A/V Shuntogram/Fistulagram;  Surgeon: Katha Cabal, MD;  Location: [place] [facility];  Service:              Cardiovascular;  Laterality: N/A; 07/17/2015: PERIPHERAL VASCULAR CATHETERIZATION; N/A     Comment:  Procedure: Thrombectomy;  Surgeon: Katha Cabal,               MD;  Location: [place] [facility];  Service:               Cardiovascular;  Laterality: N/A; 07/17/2015: PERIPHERAL VASCULAR CATHETERIZATION; N/A     Comment:  Procedure: Dialysis/Perma Catheter Insertion;  Surgeon:               Katha Cabal, MD;  Location: [place] [facility];                Service: Cardiovascular;  Laterality: N/A; 08/05/2015: PERIPHERAL VASCULAR CATHETERIZATION; N/A     Comment:  Procedure: Thrombectomy;  Surgeon: Katha Cabal,               MD;  Location: [place] [facility];  Service:               Cardiovascular;  Laterality: N/A;  declot of graft 05/10/2017: PERIPHERAL VASCULAR THROMBECTOMY; N/A     Comment:  Procedure: PERIPHERAL VASCULAR THROMBECTOMY;  Surgeon:               Algernon Huxley, MD;  Location: [place] [facility];                Service: Cardiovascular;  Laterality: N/A; No date: TOE AMPUTATION     Comment:  both big toes removed  BMI    Body Mass Index:  40.51 kg/m      Reproductive/Obstetrics negative OB ROS                             Anesthesia Physical Anesthesia Plan  ASA: III  Anesthesia  Plan: General LMA   Post-op Pain Management:    Induction:   PONV Risk Score and Plan: Ondansetron and Dexamethasone  Airway Management Planned: LMA  Additional Equipment:   Intra-op Plan:   Post-operative Plan:   Informed Consent: I have reviewed the patients History and Physical, chart, labs and discussed the procedure including the risks, benefits and alternatives for the proposed anesthesia with the patient or authorized representative who has indicated his/her understanding and acceptance.   Dental Advisory Given  Plan Discussed with: Anesthesiologist, CRNA and Surgeon  Anesthesia Plan Comments:         Anesthesia Quick Evaluation

## 2017-08-16 NOTE — Anesthesia Postprocedure Evaluation (Addendum)
Anesthesia Post Note  Patient: [name]  Procedure(s) Performed: BASCILIC VEIN TRANSPOSITION (2ND STAGE UPPER) (Right Arm Upper)  Patient location during evaluation: PACU Anesthesia Type: General Level of consciousness: awake and alert Pain management: pain level controlled Vital Signs Assessment: post-procedure vital signs reviewed and stable Respiratory status: spontaneous breathing, nonlabored ventilation and respiratory function stable Cardiovascular status: blood pressure returned to baseline and stable Postop Assessment: no apparent nausea or vomiting Anesthetic complications: no Comments: Pt received albumin 250 mL and low-dose phenylephrine infusion in PACU for SBP's 60s-70s while emerging from anesthesia.  Pt completely asymptomatic, denies chest pain, headache, vision changes.  Pt states that often her SBP will be in the 70s.  With time and fluid her SBP improved to 90s.  Pt deemed stable for PACU discharge.     Last Vitals:  Vitals:   08/16/17 1206 08/16/17 1215  BP: (!) 112/57 (!) 97/57  Pulse: 95 95  Resp: 13 13  Temp:    SpO2: 99% 100%    Last Pain:  Vitals:   08/16/17 1215  TempSrc:   PainSc: [name]

## 2017-08-16 NOTE — OR Nursing (Signed)
Dr Lucky Cowboy aware of elevated HCG level and wound on right foot.

## 2017-08-16 NOTE — Addendum Note (Signed)
Addendum  created 08/16/17 1317 by Durenda Hurt, MD   Sign clinical note

## 2017-08-16 NOTE — Op Note (Signed)
OPERATIVE NOTE   PROCEDURE: Right arm second stage basilic vein transposition (brachiobasilic arteriovenous fistula) placement  PRE-OPERATIVE DIAGNOSIS: ESRD, non-useable right arm brachiobasilic AVF  POST-OPERATIVE DIAGNOSIS: same as above  SURGEON: Leotis Pain, MD  ASSISTANT(S): none  ANESTHESIA: general  ESTIMATED BLOOD LOSS: 50 cc  FINDING(S): None  SPECIMEN(S):  None  INDICATIONS:   Patient is a 32 y.o. female who presents with end-stage renal disease and is status post an initial brachiobasilic AV fistula. The vein is deep and is not in a position that it can be used for dialysis.  The patient is scheduled for right arm second stage basilic vein transposition.  The patient is aware the risks include but are not limited to: bleeding, infection, steal syndrome, nerve damage, ischemic monomelic neuropathy, failure to mature, and need for additional procedures.  The patient is aware of the risks of the procedure and elects to proceed forward.  DESCRIPTION: After full informed written consent was obtained from the patient, the patient was brought back to the operating room and placed supine upon the operating table.  Prior to induction, the patient received IV antibiotics.   After obtaining adequate anesthesia, the patient was then prepped and draped in the standard fashion for a right arm access procedure.  I turned my attention first to identifying the patient's brachiobasilic arteriovenous fistula.  I made an longitudinal incision over the fistula from its arterial anastomosis up to its axillary extent.  I carefully dissected the fistula away from its adjacent nerves.  Eventually the entirety of this fistula was mobilized and I dissected a plane on top of the bicipital fascia with electrocautery. Several venous branches were ligated and divided between silk ties to facilitate the exposure and transposition of the fistula. I then used a large Vanderbilt clamp to tunnel superficially  from the distal portion of the incision to the proximal portion of the incision on the top of the arm. The patient was given 3000 units of intravenous heparin. I then clamped the fistula just beyond the anastomosis in the basilic vein with a profunda clamp and transected the vein about 2 cm beyond this. The vein was then transposed through the tunnel created with the large Vanderbilt clamp, taking care not to twist the vein after marking it for orientation. The vessel was flushed with heparinized saline. An end to end anastomosis was then created with the newly transposed basilic vein to the initial portion of the fistula and the basilic vein with 6-0 Prolene sutures creating an anastomosis in an end to end fashion. The vessel was flushed and de-aired prior to release of control. On release, there was poor flow beyond the anastomosis that would not support dialysis.  The anastomosis was evaluated, and I elected to take this down and redo the anastomosis.  Again, the vein was prepared on either end for an end to end anastomosis.  A small section of the vein towards the arterial anastomosis was trimmed back and some fibrous tissue removed.  The anastomosis was then created with two 6-0 Prolene sutures. It was flushed and de-aired prior to release of control. On release, a palpable thrill could be felt within the basilic vein.  The deep subcutaneous tissue was inspected for bleeding. I washed out the surgical site after waiting a few minutes, and there was no further bleeding.  Bleeding was controlled with electrocautery and placement of pieces of Surgicel and Evicel. The fascia was reapproximated with interrupted stitches of 2-0 Vicryl to eliminate some of  the deep space.  The superficial subcutaneous tissue was then reapproximated along the incision line with a running stitch of 3-0 Vicryl.  The skin was then reapproximated with a running subcuticular of 4-0 Monocryl.  The skin was then cleaned, dried, and reinforced  with Dermabond.  The patient tolerated this procedure well and was taken to the recovery room in stable condition.  COMPLICATIONS: None  CONDITION: Stable    Leotis Pain  08/16/2017, 10:08 AM      This note was created with Dragon Medical transcription system. Any errors in dictation are purely unintentional.

## 2017-08-16 NOTE — OR Nursing (Signed)
Dr Ola Spurr in to see patient am lab results reviewed.  MD aware of HCG elevation no new orders at this time.  Also aware patient has fentanyl patch on right breast.

## 2017-08-17 ENCOUNTER — Telehealth (INDEPENDENT_AMBULATORY_CARE_PROVIDER_SITE_OTHER): Payer: Self-pay

## 2017-08-18 NOTE — Telephone Encounter (Signed)
I spoke with the pharmacy and inform them [place] advise

## 2017-09-06 ENCOUNTER — Encounter (INDEPENDENT_AMBULATORY_CARE_PROVIDER_SITE_OTHER): Payer: Medicare Other

## 2017-09-06 ENCOUNTER — Ambulatory Visit (INDEPENDENT_AMBULATORY_CARE_PROVIDER_SITE_OTHER): Payer: Medicare Other | Admitting: Vascular Surgery

## 2017-10-03 ENCOUNTER — Encounter (INDEPENDENT_AMBULATORY_CARE_PROVIDER_SITE_OTHER): Payer: Self-pay

## 2017-10-04 ENCOUNTER — Other Ambulatory Visit (INDEPENDENT_AMBULATORY_CARE_PROVIDER_SITE_OTHER): Payer: Self-pay | Admitting: Nurse Practitioner

## 2017-10-08 MED ORDER — CLINDAMYCIN PHOSPHATE 300 MG/50ML IV SOLN
300.0000 mg | Freq: Once | INTRAVENOUS | Status: AC
  Administered 2017-10-09: 300 mg via INTRAVENOUS

## 2017-10-09 ENCOUNTER — Ambulatory Visit: Payer: Medicare Other | Admitting: Certified Registered"

## 2017-10-09 ENCOUNTER — Encounter
Admission: RE | Disposition: A | Discharge status: Home / Self Care | Payer: Self-pay | Source: Ambulatory Visit | Attending: Vascular Surgery

## 2017-10-09 ENCOUNTER — Ambulatory Visit
Admission: RE | Admit: 2017-10-09 | Discharge: 2017-10-09 | Disposition: A | Discharge status: Home / Self Care | Payer: Medicare Other | Source: Ambulatory Visit | Attending: Vascular Surgery | Admitting: Vascular Surgery

## 2017-10-09 DIAGNOSIS — J45909 Unspecified asthma, uncomplicated: Secondary | ICD-10-CM | POA: Insufficient documentation

## 2017-10-09 DIAGNOSIS — Z833 Family history of diabetes mellitus: Secondary | ICD-10-CM | POA: Diagnosis not present

## 2017-10-09 DIAGNOSIS — Z880 Allergy status to penicillin: Secondary | ICD-10-CM | POA: Diagnosis not present

## 2017-10-09 DIAGNOSIS — Y832 Surgical operation with anastomosis, bypass or graft as the cause of abnormal reaction of the patient, or of later complication, without mention of misadventure at the time of the procedure: Secondary | ICD-10-CM | POA: Diagnosis not present

## 2017-10-09 DIAGNOSIS — E11649 Type 2 diabetes mellitus with hypoglycemia without coma: Secondary | ICD-10-CM | POA: Diagnosis not present

## 2017-10-09 DIAGNOSIS — Z91040 Latex allergy status: Secondary | ICD-10-CM | POA: Insufficient documentation

## 2017-10-09 DIAGNOSIS — Z8249 Family history of ischemic heart disease and other diseases of the circulatory system: Secondary | ICD-10-CM | POA: Diagnosis not present

## 2017-10-09 DIAGNOSIS — T82858A Stenosis of vascular prosthetic devices, implants and grafts, initial encounter: Secondary | ICD-10-CM | POA: Diagnosis present

## 2017-10-09 DIAGNOSIS — Z89412 Acquired absence of left great toe: Secondary | ICD-10-CM | POA: Diagnosis not present

## 2017-10-09 DIAGNOSIS — N186 End stage renal disease: Secondary | ICD-10-CM | POA: Diagnosis not present

## 2017-10-09 DIAGNOSIS — R569 Unspecified convulsions: Secondary | ICD-10-CM | POA: Diagnosis not present

## 2017-10-09 DIAGNOSIS — Z9889 Other specified postprocedural states: Secondary | ICD-10-CM | POA: Insufficient documentation

## 2017-10-09 DIAGNOSIS — G4730 Sleep apnea, unspecified: Secondary | ICD-10-CM | POA: Diagnosis not present

## 2017-10-09 DIAGNOSIS — I120 Hypertensive chronic kidney disease with stage 5 chronic kidney disease or end stage renal disease: Secondary | ICD-10-CM | POA: Insufficient documentation

## 2017-10-09 DIAGNOSIS — Z881 Allergy status to other antibiotic agents status: Secondary | ICD-10-CM | POA: Insufficient documentation

## 2017-10-09 DIAGNOSIS — E1122 Type 2 diabetes mellitus with diabetic chronic kidney disease: Secondary | ICD-10-CM | POA: Diagnosis not present

## 2017-10-09 DIAGNOSIS — Z89411 Acquired absence of right great toe: Secondary | ICD-10-CM | POA: Diagnosis not present

## 2017-10-09 DIAGNOSIS — T82868A Thrombosis of vascular prosthetic devices, implants and grafts, initial encounter: Secondary | ICD-10-CM | POA: Diagnosis not present

## 2017-10-09 DIAGNOSIS — Z992 Dependence on renal dialysis: Secondary | ICD-10-CM | POA: Diagnosis not present

## 2017-10-09 DIAGNOSIS — Z9884 Bariatric surgery status: Secondary | ICD-10-CM | POA: Diagnosis not present

## 2017-10-09 DIAGNOSIS — K219 Gastro-esophageal reflux disease without esophagitis: Secondary | ICD-10-CM | POA: Diagnosis not present

## 2017-10-09 DIAGNOSIS — Z823 Family history of stroke: Secondary | ICD-10-CM | POA: Insufficient documentation

## 2017-10-09 DIAGNOSIS — F17210 Nicotine dependence, cigarettes, uncomplicated: Secondary | ICD-10-CM | POA: Insufficient documentation

## 2017-10-09 HISTORY — PX: A/V FISTULAGRAM: CATH118298

## 2017-10-09 LAB — POTASSIUM (ARMC VASCULAR LAB ONLY): Potassium (ARMC vascular lab): 5.5 — ABNORMAL HIGH (ref 3.5–5.1)

## 2017-10-09 LAB — GLUCOSE, CAPILLARY: Glucose-Capillary: 98 mg/dL (ref 70–99)

## 2017-10-09 SURGERY — A/V FISTULAGRAM
Anesthesia: General | Laterality: Right

## 2017-10-09 MED ORDER — MIDAZOLAM HCL 2 MG/2ML IJ SOLN
INTRAMUSCULAR | Status: DC | PRN
  Administered 2017-10-09: 2 mg via INTRAVENOUS

## 2017-10-09 MED ORDER — LIDOCAINE-EPINEPHRINE (PF) 1 %-1:200000 IJ SOLN
INTRAMUSCULAR | Status: AC
  Filled 2017-10-09: qty 30

## 2017-10-09 MED ORDER — PROPOFOL 10 MG/ML IV BOLUS
INTRAVENOUS | Status: AC
  Filled 2017-10-09: qty 20

## 2017-10-09 MED ORDER — IOPAMIDOL (ISOVUE-300) INJECTION 61%
INTRAVENOUS | Status: DC | PRN
  Administered 2017-10-09 (×2): 35 mL via INTRAVENOUS

## 2017-10-09 MED ORDER — HEPARIN SODIUM (PORCINE) 1000 UNIT/ML IJ SOLN
INTRAMUSCULAR | Status: AC
  Filled 2017-10-09: qty 1

## 2017-10-09 MED ORDER — MIDAZOLAM HCL 2 MG/2ML IJ SOLN
INTRAMUSCULAR | Status: AC
  Filled 2017-10-09: qty 2

## 2017-10-09 MED ORDER — OXYCODONE HCL 5 MG PO TABS: 5.0000 mg | ORAL_TABLET | Freq: Once | ORAL | Status: DC | PRN

## 2017-10-09 MED ORDER — ONDANSETRON HCL 4 MG/2ML IJ SOLN
INTRAMUSCULAR | Status: AC
  Filled 2017-10-09: qty 2

## 2017-10-09 MED ORDER — ONDANSETRON HCL 4 MG/2ML IJ SOLN
4.0000 mg | Freq: Four times a day (QID) | INTRAMUSCULAR | Status: DC | PRN
Start: 2017-10-09 — End: 2017-10-09
  Administered 2017-10-09: 4 mg via INTRAVENOUS

## 2017-10-09 MED ORDER — PHENYLEPHRINE HCL 10 MG/ML IJ SOLN
INTRAMUSCULAR | Status: DC | PRN
  Administered 2017-10-09 (×2): 100 ug via INTRAVENOUS
  Administered 2017-10-09 (×3): 200 ug via INTRAVENOUS

## 2017-10-09 MED ORDER — LIDOCAINE HCL (CARDIAC) PF 100 MG/5ML IV SOSY
PREFILLED_SYRINGE | INTRAVENOUS | Status: DC | PRN
  Administered 2017-10-09: 100 mg via INTRAVENOUS

## 2017-10-09 MED ORDER — SODIUM CHLORIDE 0.9 % IJ SOLN
INTRAMUSCULAR | Status: AC
  Filled 2017-10-09: qty 10

## 2017-10-09 MED ORDER — HYDROMORPHONE HCL 1 MG/ML IJ SOLN: 1.0000 mg | Freq: Once | INTRAMUSCULAR | Status: DC | PRN

## 2017-10-09 MED ORDER — EPHEDRINE SULFATE 50 MG/ML IJ SOLN
INTRAMUSCULAR | Status: DC | PRN
  Administered 2017-10-09 (×2): 10 mg via INTRAVENOUS

## 2017-10-09 MED ORDER — HEPARIN SODIUM (PORCINE) 1000 UNIT/ML IJ SOLN
INTRAMUSCULAR | Status: DC | PRN
  Administered 2017-10-09: 3000 [IU] via INTRAVENOUS

## 2017-10-09 MED ORDER — CLINDAMYCIN PHOSPHATE 300 MG/50ML IV SOLN
INTRAVENOUS | Status: AC
  Filled 2017-10-09: qty 50

## 2017-10-09 MED ORDER — PROPOFOL 10 MG/ML IV BOLUS
INTRAVENOUS | Status: DC | PRN
  Administered 2017-10-09: 150 mg via INTRAVENOUS

## 2017-10-09 MED ORDER — EPHEDRINE SULFATE 50 MG/ML IJ SOLN
INTRAMUSCULAR | Status: AC
  Filled 2017-10-09: qty 1

## 2017-10-09 MED ORDER — FENTANYL CITRATE (PF) 100 MCG/2ML IJ SOLN
INTRAMUSCULAR | Status: DC | PRN
  Administered 2017-10-09: 100 ug via INTRAVENOUS

## 2017-10-09 MED ORDER — FENTANYL CITRATE (PF) 100 MCG/2ML IJ SOLN
INTRAMUSCULAR | Status: AC
  Filled 2017-10-09: qty 2

## 2017-10-09 MED ORDER — OXYCODONE HCL 5 MG/5ML PO SOLN
5.0000 mg | Freq: Once | ORAL | Status: DC | PRN
  Filled 2017-10-09: qty 5

## 2017-10-09 MED ORDER — SODIUM CHLORIDE 0.9 % IV SOLN
INTRAVENOUS | Status: DC
  Administered 2017-10-09: 12:00:00 via INTRAVENOUS

## 2017-10-09 MED ORDER — HEPARIN (PORCINE) IN NACL 1000-0.9 UT/500ML-% IV SOLN
INTRAVENOUS | Status: AC
  Filled 2017-10-09: qty 1000

## 2017-10-09 MED ORDER — FENTANYL CITRATE (PF) 100 MCG/2ML IJ SOLN: 25.0000 ug | INTRAMUSCULAR | Status: DC | PRN

## 2017-10-09 SURGICAL SUPPLY — 14 items
BALLN LUTONIX DCB 5X80X130 (BALLOONS) ×3
BALLN LUTONIX DCB 6X60X130 (BALLOONS) ×3
BALLOON LUTONIX DCB 5X80X130 (BALLOONS) ×1 IMPLANT
BALLOON LUTONIX DCB 6X60X130 (BALLOONS) ×1 IMPLANT
CANNULA 5F STIFF (CANNULA) ×3 IMPLANT
CATH BEACON 5 .035 40 KMP TP (CATHETERS) ×1 IMPLANT
CATH BEACON 5 .038 40 KMP TP (CATHETERS) ×2
DEVICE PRESTO INFLATION (MISCELLANEOUS) ×3 IMPLANT
DRAPE BRACHIAL (DRAPES) ×3 IMPLANT
GLIDEWIRE ADV .035X180CM (WIRE) ×3 IMPLANT
PACK ANGIOGRAPHY (CUSTOM PROCEDURE TRAY) ×3 IMPLANT
SHEATH BRITE TIP 6FRX5.5 (SHEATH) ×6 IMPLANT
SUT MNCRL AB 4-0 PS2 18 (SUTURE) ×3 IMPLANT
WIRE MAGIC TOR.035 180C (WIRE) ×6 IMPLANT

## 2017-10-09 NOTE — Progress Notes (Signed)
Patient post upper arm fistulogram per DR Lucky Cowboy, in recovery since 1408,vitals have remained stable, discharge instructions given with questions answered.denies complaints.

## 2017-10-09 NOTE — Anesthesia Post-op Follow-up Note (Signed)
Anesthesia QCDR form completed.        

## 2017-10-09 NOTE — Op Note (Signed)
[facility]    OPERATIVE NOTE   PROCEDURE: 1.   Right brachiobasilic arteriovenous fistula cannulation under ultrasound guidance in both an antegrade and retrograde fashion 2.   Right arm fistulagram including central venogram 3.   Percutaneous transluminal angioplasty of the right mid to proximal upper arm basilic vein with 6 mm diameter by 6 cm length Lutonix drug-coated angioplasty balloon 4.   Percutaneous transluminal angioplasty of the arteriovenous anastomosis and the distal upper arm basilic vein with 5 mm diameter by 8 cm length Lutonix drug-coated angioplasty point  PRE-OPERATIVE DIAGNOSIS: 1. ESRD 2. Poorly functional right brachiobasilic AVF  POST-OPERATIVE DIAGNOSIS: same as above   SURGEON: Jason Dew, MD  ANESTHESIA: local with MCS  ESTIMATED BLOOD LOSS: 10 cc  FINDING(S): 1. Nearly occlusive perianastomotic stenosis in the 90% range in the basilic vein.  Mid upper arm basilic vein with about a 65 to 70% stenosis.  The remainder of the fistula was patent as was the central venous circulation.  SPECIMEN(S):  None  CONTRAST: 35 cc  FLUORO TIME: 3.8 minutes  ANESTHESIA: general  INDICATIONS: [name] is a 32 y.o. female who presents with malfunctioning not maturing right brachiobasilic arteriovenous fistula.  The patient is scheduled for right arm fistulagram.  The patient is aware the risks include but are not limited to: bleeding, infection, thrombosis of the cannulated access, and possible anaphylactic reaction to the contrast.  The patient is aware of the risks of the procedure and elects to proceed forward.  DESCRIPTION: After full informed written consent was obtained, the patient was brought back to the angiography suite and placed supine upon the angiography table.  The patient was connected to monitoring equipment. Moderate conscious sedation was administered with a face to face encounter with the patient throughout the  procedure with my supervision of the RN administering medicines and monitoring the patient's vital signs and mental status throughout from the start of the procedure until the patient was taken to the recovery room. The right arm was prepped and draped in the standard fashion for a percutaneous access intervention.  Under ultrasound guidance, the distal upper arm basilic vein portion of the arteriovenous fistula was cannulated with a micropuncture needle under direct ultrasound guidance antegrade fashion and a permanent image was performed.  The microwire was advanced into the fistula and the needle was exchanged for the a microsheath.  I then upsized to a 6 Fr Sheath and imaging was performed.  Hand injections were completed to image the access including the central venous system. This demonstrated Nearly occlusive perianastomotic stenosis in the 90% range in the basilic vein.  Mid upper arm basilic vein with about a 65 to 70% stenosis.  The remainder of the fistula was patent as was the central venous circulation.  Based on the images, this patient will need to both places. I then gave the patient 3000 units of intravenous heparin.  I then crossed the proximal to mid upper arm basilic vein stenosis with a Magic Tourqe wire.  Based on the imaging, a 6 mm x 6 cm Lutonix drug-coated angioplasty balloon was selected.  The balloon was centered around the mid to proximal upper arm basilic vein stenosis and inflated to 12 ATM for 1 minute(s).  On completion imaging, a 10-15 % residual stenosis was present.   I then remove this sheath and placed a 4-0 Monocryl pursestring suture.  To address the perianastomotic stenosis, a separate access in a retrograde fashion was performed in   the proximal to mid upper arm basilic vein under direct ultrasound guidance with a micropuncture needle.  A permanent image was recorded and a micropuncture wire and sheath were then placed.  I upsized to a 6 French sheath and used an advantage  wire and a Kumpe catheter to cross the stenosis and confirm intra-arterial blood flow in the brachial artery.  The catheter was then removed and the balloon was inflated from the arteriovenous anastomosis back into the distal upper arm basilic vein to encompass the stenosis.  It was inflated to 14 atm with a 5 mm diameter by 8 cm length Lutonix drug-coated angioplasty balloon.  Completion imaging showed about a 30% residual stenosis in the perianastomotic basilic vein with brisk flow through the fistula.  Based on the completion imaging, no further intervention is necessary.  The wire and balloon were removed from the sheath.  A 4-0 Monocryl purse-string suture was sewn around the sheath.  The sheath was removed while tying down the suture.  A sterile bandage was applied to the puncture site.  COMPLICATIONS: None  CONDITION: Stable   Jason Dew  10/09/2017 1:57 PM   This note was created with Dragon Medical transcription system. Any errors in dictation are purely unintentional. 

## 2017-10-09 NOTE — Anesthesia Preprocedure Evaluation (Addendum)
Anesthesia Evaluation  Patient identified by MRN, date of birth, ID band Patient awake    Reviewed: Allergy & Precautions, H&P , NPO status , Patient's Chart, lab work & pertinent test results  History of Anesthesia Complications Negative for: history of anesthetic complications  Airway Mallampati: III   Neck ROM: full   Comment: TM 3 FB Dental   Pulmonary neg pulmonary ROS, asthma , sleep apnea , Current Smoker,    breath sounds clear to auscultation       Cardiovascular hypertension, negative cardio ROS   Rhythm:regular Rate:Normal     Neuro/Psych Seizures -,  negative neurological ROS  negative psych ROS   GI/Hepatic negative GI ROS, Neg liver ROS, GERD  Controlled,  Endo/Other  negative endocrine ROSdiabetes  Renal/GU ESRFRenal disease     Musculoskeletal   Abdominal   Peds  Hematology negative hematology ROS (+) anemia ,   Anesthesia Other Findings Past Medical History: No date: Anemia No date: Asthma No date: Diabetes mellitus No date: Dysrhythmia Jun 17 2015: Gastric bypass status for obesity No date: GERD (gastroesophageal reflux disease) No date: Hypertension No date: Irregular heart beat No date: Morbid obesity ([place]) No date: Renal insufficiency No date: Seizures ([place]) No date: Sleep apnea  Past Surgical History: 08/26/10: AV FISTULA PLACEMENT     Comment:  Left Radiocephalic AVF 10/23/3005: AV FISTULA PLACEMENT     Comment:  Procedure: ARTERIOVENOUS (AV) FISTULA CREATION;                Surgeon: Algernon Huxley, MD;  Location: [facility];  Service:               Vascular;; 06/28/2017: AV FISTULA PLACEMENT; Right     Comment:  Procedure: ARTERIOVENOUS (AV) FISTULA CREATION (               BRACHIAL BASILIC );  Surgeon: Algernon Huxley, MD;                Location: [facility];  Service: Vascular;  Laterality:               Right; No date: BACK SURGERY 07/23/6331: [place]; Right      Comment:  Procedure: BASCILIC VEIN TRANSPOSITION (2ND STAGE               UPPER);  Surgeon: Algernon Huxley, MD;  Location: [facility];               Service: Vascular;  Laterality: Right; 05/26/2017: CARPAL TUNNEL RELEASE; Left     Comment:  Procedure: CARPAL TUNNEL RELEASE;  Surgeon: Hessie Knows, MD;  Location: [facility];  Service: Orthopedics;               Laterality: Left; 06/09/2017: CARPAL TUNNEL RELEASE; Left     Comment:  Procedure: CARPAL TUNNEL RELEASE;  Surgeon: Hessie Knows, MD;  Location: [facility];  Service: Orthopedics;               Laterality: Left; 03/07/2016: DIALYSIS/PERMA CATHETER REMOVAL; N/A     Comment:  Procedure: Dialysis/Perma Catheter Removal;  Surgeon:               Algernon Huxley, MD;  Location: [place] [facility];  Service: Cardiovascular;  Laterality: N/A; No date: EYE SURGERY     Comment:  laser photocoagulation 2008: EYE SURGERY; Right     Comment:  removal of blood clot No date: INTRAUTERINE DEVICE (IUD) INSERTION 07/07/2014: PERIPHERAL VASCULAR CATHETERIZATION; N/A     Comment:  Procedure: A/V Shuntogram/Fistulagram;  Surgeon: Algernon Huxley, MD;  Location: [place] [facility];  Service:               Cardiovascular;  Laterality: N/A; 07/07/2014: PERIPHERAL VASCULAR CATHETERIZATION; N/A     Comment:  Procedure: A/V Shunt Intervention;  Surgeon: Algernon Huxley, MD;  Location: [place] [facility];  Service:               Cardiovascular;  Laterality: N/A; 01/12/2015: PERIPHERAL VASCULAR CATHETERIZATION; Right     Comment:  Procedure: A/V Shuntogram/Fistulagram;  Surgeon: Algernon Huxley, MD;  Location: [place] [facility];  Service:               Cardiovascular;  Laterality: Right; 01/12/2015: PERIPHERAL VASCULAR CATHETERIZATION; N/A     Comment:  Procedure: A/V Shunt Intervention;  Surgeon: Algernon Huxley, MD;  Location: [place] [facility];  Service:                Cardiovascular;  Laterality: N/A; 06/08/2015: PERIPHERAL VASCULAR CATHETERIZATION; Right     Comment:  Procedure: A/V Shuntogram/Fistulagram;  Surgeon: Algernon Huxley, MD;  Location: [place] [facility];  Service:               Cardiovascular;  Laterality: Right; 06/08/2015: PERIPHERAL VASCULAR CATHETERIZATION; N/A     Comment:  Procedure: A/V Shunt Intervention;  Surgeon: Algernon Huxley, MD;  Location: [place] [facility];  Service:               Cardiovascular;  Laterality: N/A; 07/14/2015: PERIPHERAL VASCULAR CATHETERIZATION; Right     Comment:  Procedure: Thrombectomy;  Surgeon: Katha Cabal,               MD;  Location: [place] [facility];  Service:               Cardiovascular;  Laterality: Right; 07/14/2015: PERIPHERAL VASCULAR CATHETERIZATION; N/A     Comment:  Procedure: A/V Shuntogram/Fistulagram;  Surgeon: Katha Cabal, MD;  Location: [place] [facility] [place] [facility];  Service:              Cardiovascular;  Laterality: N/A; 07/17/2015: PERIPHERAL VASCULAR CATHETERIZATION; N/A     Comment:  Procedure: Thrombectomy;  Surgeon: Katha Cabal,               MD;  Location: [place] [facility];  Service:               Cardiovascular;  Laterality: N/A; 07/17/2015: PERIPHERAL VASCULAR CATHETERIZATION; N/A     Comment:  Procedure: Dialysis/Perma Catheter Insertion;  Surgeon:  Katha Cabal, MD;  Location: [facility] [place] [facility];                Service: Cardiovascular;  Laterality: N/A; 08/05/2015: PERIPHERAL VASCULAR CATHETERIZATION; N/A     Comment:  Procedure: Thrombectomy;  Surgeon: Katha Cabal,               MD;  Location: [facility] [facility];  Service:               Cardiovascular;  Laterality: N/A;  declot of graft 05/10/2017: PERIPHERAL VASCULAR THROMBECTOMY; N/A     Comment:  Procedure: PERIPHERAL VASCULAR THROMBECTOMY;  Surgeon:               Algernon Huxley, MD;  Location: [facility] [facility];                Service:  Cardiovascular;  Laterality: N/A; No date: TOE AMPUTATION     Comment:  both big toes removed  BMI    Body Mass Index:  41.20 kg/m      Reproductive/Obstetrics negative OB ROS                            Anesthesia Physical Anesthesia Plan  ASA: IV  Anesthesia Plan: General LMA   Post-op Pain Management:    Induction:   PONV Risk Score and Plan: Ondansetron and Dexamethasone  Airway Management Planned:   Additional Equipment:   Intra-op Plan:   Post-operative Plan:   Informed Consent: I have reviewed the patients History and Physical, chart, labs and discussed the procedure including the risks, benefits and alternatives for the proposed anesthesia with the patient or authorized representative who has indicated his/her understanding and acceptance.   Dental Advisory Given  Plan Discussed with: Anesthesiologist, CRNA and Surgeon  Anesthesia Plan Comments:         Anesthesia Quick Evaluation

## 2017-10-09 NOTE — Anesthesia Procedure Notes (Signed)
Procedure Name: LMA Insertion Date/Time: 10/09/2017 1:07 PM Performed by: Silvana Newness, CRNA Pre-anesthesia Checklist: Patient identified, Emergency Drugs available, Suction available, Patient being monitored and Timeout performed Patient Re-evaluated:Patient Re-evaluated prior to induction Oxygen Delivery Method: Circle system utilized Preoxygenation: Pre-oxygenation with 100% oxygen Induction Type: IV induction Ventilation: Mask ventilation without difficulty LMA: LMA inserted LMA Size: 4.0 Number of attempts: 1 Placement Confirmation: positive ETCO2 and breath sounds checked- equal and bilateral Tube secured with: Tape Dental Injury: Teeth and Oropharynx as per pre-operative assessment

## 2017-10-09 NOTE — Transfer of Care (Signed)
Immediate Anesthesia Transfer of Care Note  Patient: [name]  Procedure(s) Performed: A/V FISTULAGRAM (Right )  Patient Location: PACU  Anesthesia Type:General  Level of Consciousness: awake, drowsy and patient cooperative  Airway & Oxygen Therapy: Patient Spontanous Breathing and Patient connected to face mask oxygen  Post-op Assessment: Report given to RN and Post -op Vital signs reviewed and stable  Post vital signs: Reviewed and stable  Last Vitals:  Vitals Value Taken Time  BP 122/78 10/09/2017  2:09 PM  Temp    Pulse    Resp    SpO2    Vitals shown include unvalidated device data.  Last Pain:  Vitals:   10/09/17 1139  TempSrc: Oral         Complications: No apparent anesthesia complications

## 2017-10-09 NOTE — H&P (Signed)
[facility] Admission History & Physical  MRN : [id number]  [name] is a 32 y.o. (05/01/1985) female who presents with chief complaint of No chief complaint on file. [name] Kitchen  History of Present Illness: I am asked to evaluate the patient by the dialysis center. The patient was sent here because they were unable to achieve adequate dialysis with her new AVF. The patient estimates these problems have been going on for several weeks. The patient is unaware of any other change.  Patient denies pain or tenderness overlying the access.  There is no pain with dialysis.  The patient denies hand pain or finger pain consistent with steal syndrome.   There have not been any interventions since this new AVF was placed earlier this year, but it has not been reliably used yet.  The patient is not chronically hypotensive on dialysis.  Current Facility-Administered Medications  Medication Dose Route Frequency Provider Last Rate Last Dose  . clindamycin (CLEOCIN) IVPB 300 mg  300 mg Intravenous Once Kris Hartmann, NP        Past Medical History:  Diagnosis Date  . Anemia   . Asthma   . Diabetes mellitus   . Dysrhythmia   . 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 GERD (gastroesophageal reflux disease)   . Hypertension   . Irregular heart beat   . Morbid obesity ([place])   . Renal insufficiency   . Seizures ([place])   . Sleep apnea     Past Surgical History:  Procedure Laterality Date  . AV FISTULA PLACEMENT  08/26/10   Left Radiocephalic AVF  . AV FISTULA PLACEMENT  08/26/2015   Procedure: ARTERIOVENOUS (AV) FISTULA CREATION;  Surgeon: Algernon Huxley, MD;  Location: [facility];  Service: Vascular;;  . AV FISTULA PLACEMENT Right 06/28/2017   Procedure: ARTERIOVENOUS (AV) FISTULA CREATION ( BRACHIAL BASILIC );  Surgeon: Algernon Huxley, MD;  Location: [facility];  Service: Vascular;  Laterality: Right;  . BACK SURGERY    . BASCILIC VEIN TRANSPOSITION Right 08/16/2017   Procedure: BASCILIC VEIN TRANSPOSITION (2ND STAGE UPPER);  Surgeon: Algernon Huxley, MD;  Location: [facility];  Service: Vascular;  Laterality: Right;  . CARPAL TUNNEL RELEASE Left 05/26/2017   Procedure: CARPAL TUNNEL RELEASE;  Surgeon: Hessie Knows, MD;  Location: [facility];  Service: Orthopedics;  Laterality: Left;  . CARPAL TUNNEL RELEASE Left 06/09/2017   Procedure: CARPAL TUNNEL RELEASE;  Surgeon: Hessie Knows, MD;  Location: [facility];  Service: Orthopedics;  Laterality: Left;  . DIALYSIS/PERMA CATHETER REMOVAL N/A 03/07/2016   Procedure: Dialysis/Perma Catheter Removal;  Surgeon: Algernon Huxley, MD;  Location: [place] [facility];  Service: Cardiovascular;  Laterality: N/A;  . EYE SURGERY     laser photocoagulation  . EYE SURGERY Right 2008   removal of blood clot  . INTRAUTERINE DEVICE (IUD) INSERTION    . PERIPHERAL VASCULAR CATHETERIZATION N/A 07/07/2014   Procedure: A/V Shuntogram/Fistulagram;  Surgeon: Algernon Huxley, MD;  Location: [place] [facility];  Service: Cardiovascular;  Laterality: N/A;  . PERIPHERAL VASCULAR CATHETERIZATION N/A 07/07/2014   Procedure: A/V Shunt Intervention;  Surgeon: Algernon Huxley, MD;  Location: [place] [facility];  Service: Cardiovascular;  Laterality: N/A;  . PERIPHERAL VASCULAR CATHETERIZATION Right 01/12/2015   Procedure: A/V Shuntogram/Fistulagram;  Surgeon: Algernon Huxley, MD;  Location: [place] [facility];  Service: Cardiovascular;  Laterality: Right;  . PERIPHERAL VASCULAR CATHETERIZATION N/A 01/12/2015   Procedure: A/V Shunt  Intervention;  Surgeon: Algernon Huxley, MD;  Location: [place] [facility];  Service: Cardiovascular;  Laterality: N/A;  . PERIPHERAL VASCULAR CATHETERIZATION Right 06/08/2015   Procedure: A/V Shuntogram/Fistulagram;  Surgeon: Algernon Huxley, MD;  Location: [place] [facility];  Service: Cardiovascular;  Laterality: Right;  . PERIPHERAL VASCULAR CATHETERIZATION N/A 06/08/2015   Procedure: A/V Shunt Intervention;  Surgeon: Algernon Huxley, MD;   Location: [place] [facility];  Service: Cardiovascular;  Laterality: N/A;  . PERIPHERAL VASCULAR CATHETERIZATION Right 07/14/2015   Procedure: Thrombectomy;  Surgeon: Katha Cabal, MD;  Location: [place] [facility];  Service: Cardiovascular;  Laterality: Right;  . PERIPHERAL VASCULAR CATHETERIZATION N/A 07/14/2015   Procedure: A/V Shuntogram/Fistulagram;  Surgeon: Katha Cabal, MD;  Location: [place] [facility];  Service: Cardiovascular;  Laterality: N/A;  . PERIPHERAL VASCULAR CATHETERIZATION N/A 07/17/2015   Procedure: Thrombectomy;  Surgeon: Katha Cabal, MD;  Location: [place] [facility];  Service: Cardiovascular;  Laterality: N/A;  . PERIPHERAL VASCULAR CATHETERIZATION N/A 07/17/2015   Procedure: Dialysis/Perma Catheter Insertion;  Surgeon: Katha Cabal, MD;  Location: [place] [facility];  Service: Cardiovascular;  Laterality: N/A;  . PERIPHERAL VASCULAR CATHETERIZATION N/A 08/05/2015   Procedure: Thrombectomy;  Surgeon: Katha Cabal, MD;  Location: [place] [facility];  Service: Cardiovascular;  Laterality: N/A;  declot of graft  . PERIPHERAL VASCULAR THROMBECTOMY N/A 05/10/2017   Procedure: PERIPHERAL VASCULAR THROMBECTOMY;  Surgeon: Algernon Huxley, MD;  Location: [place] [facility];  Service: Cardiovascular;  Laterality: N/A;  . TOE AMPUTATION     both big toes removed    Social History Social History   Tobacco Use  . Smoking status: Light Tobacco Smoker    Packs/day: 0.25    Years: 10.00    Pack years: 2.50    Types: Cigarettes  . Smokeless tobacco: Never Used  Substance Use Topics  . Alcohol use: No  . Drug use: No    Family History Family History  Problem Relation Age of Onset  . Diabetes Mother   . Heart attack Mother   . Stroke Mother   . High Cholesterol Mother   . Diabetes Father     No family history of bleeding or clotting disorders, autoimmune disease or porphyria  Allergies  Allergen Reactions  . Marcillin [Ampicillin] Rash  and Other (See Comments)    Breathing and heart palp  Hot and sweating Has patient had a PCN reaction causing immediate rash, facial/tongue/throat swelling, SOB or lightheadedness with hypotension: Yes Has patient had a PCN reaction causing severe rash involving mucus membranes or skin necrosis: No Has patient had a PCN reaction that required hospitalization Unknown Has patient had a PCN reaction occurring within the last 10 years: Yes If all of the above answers are "NO", then may proceed with Cephalosporin use.   [name] Kitchen Penicillins Other (See Comments)    Heart races  . Latex Swelling and Rash  . Vancomycin Itching, Nausea Only and Rash    Thick pieces of skin pulls off.     REVIEW OF SYSTEMS (Negative unless checked)  Constitutional: [] Weight loss  [] Fever  [] Chills Cardiac: [] Chest pain   [] Chest pressure   [] Palpitations   [] Shortness of breath when laying flat   [] Shortness of breath at rest   [x] Shortness of breath with exertion. Vascular:  [] Pain in legs with walking   [] Pain in legs at rest   [] Pain in legs when laying flat   [] Claudication   [] Pain in  feet when walking  [] Pain in feet at rest  [] Pain in feet when laying flat   [] History of DVT   [] Phlebitis   [] Swelling in legs   [] Varicose veins   [] Non-healing ulcers Pulmonary:   [] Uses home oxygen   [] Productive cough   [] Hemoptysis   [] Wheeze  [] COPD   [] Asthma Neurologic:  [] Dizziness  [] Blackouts   [] Seizures   [] History of stroke   [] History of TIA  [] Aphasia   [] Temporary blindness   [] Dysphagia   [] Weakness or numbness in arms   [] Weakness or numbness in legs Musculoskeletal:  [] Arthritis   [] Joint swelling   [] Joint pain   [] Low back pain Hematologic:  [] Easy bruising  [] Easy bleeding   [] Hypercoagulable state   [] Anemic  [] Hepatitis Gastrointestinal:  [] Blood in stool   [] Vomiting blood  [] Gastroesophageal reflux/heartburn   [] Difficulty swallowing. Genitourinary:  [x] Chronic kidney disease   [] Difficult urination   [] Frequent urination  [] Burning with urination   [] Blood in urine Skin:  [] Rashes   [] Ulcers   [] Wounds Psychological:  [] History of anxiety   []  History of major depression.  Physical Examination  There were no vitals filed for this visit. There is no height or weight on file to calculate BMI. Gen: WD/WN, NAD Head: /AT, No temporalis wasting.  Ear/Nose/Throat: Hearing grossly intact, nares w/o erythema or drainage, oropharynx w/o Erythema/Exudate,  Eyes: Conjunctiva clear, sclera non-icteric Neck: Trachea midline.  No JVD.  Pulmonary:  Good air movement, respirations not labored, no use of accessory muscles.  Cardiac: RRR, normal S1, S2. Vascular: weak thrill right upper arm AVF Vessel Right Left  Radial Palpable Palpable               Musculoskeletal: M/S 5/5 throughout.  Extremities without ischemic changes.  No deformity or atrophy.  Neurologic: Sensation grossly intact in extremities.  Symmetrical.  Speech is fluent. Motor exam as listed above. Psychiatric: Judgment intact, Mood & affect appropriate for pt's clinical situation. Dermatologic: No rashes or ulcers noted.  No cellulitis or open wounds.    CBC Lab Results  Component Value Date   WBC 8.3 08/16/2017   HGB 12.3 08/16/2017   HCT 37.7 08/16/2017   MCV 90.3 08/16/2017   PLT 202 08/16/2017    BMET    Component Value Date/Time   NA 139 08/16/2017 0554   NA 132 (L) 02/03/2014 1007   K 4.3 08/16/2017 0554   K 4.7 02/03/2014 1007   CL 99 08/16/2017 0554   CL 93 (L) 02/03/2014 1007   CO2 28 08/16/2017 0554   CO2 25 02/03/2014 1007   GLUCOSE 111 (H) 08/16/2017 0554   GLUCOSE 258 (H) 02/03/2014 1007   BUN 38 (H) 08/16/2017 0554   BUN 58 (H) 02/03/2014 1007   CREATININE 8.28 (H) 08/16/2017 0554   CREATININE 11.14 (H) 02/03/2014 1007   CALCIUM 8.0 (L) 08/16/2017 0554   CALCIUM 8.4 (L) 02/03/2014 1007   CALCIUM 8.1 (L) 08/23/2010 1000   GFRNONAA 6 (L) 08/16/2017 0554   GFRNONAA 4 (L) 02/03/2014 1007    GFRNONAA 5 (L) 07/19/2013 0416   GFRAA 7 (L) 08/16/2017 0554   GFRAA 5 (L) 02/03/2014 1007   GFRAA 6 (L) 07/19/2013 0416   CrCl cannot be calculated (Patient's most recent lab result is older than the maximum 21 days allowed.).  COAG Lab Results  Component Value Date   INR 1.05 08/16/2017   INR 1.07 06/28/2017   INR 1.11 08/19/2015    Radiology No results found.  Assessment/Plan 1.  Complication dialysis device with thrombosis AV access:  Patient's right arm dialysis access is malfunctioning. The patient will undergo angiography and correction of any problems using interventional techniques with the hope of restoring function to the access.  The risks and benefits were described to the patient.  All questions were answered.  The patient agrees to proceed with angiography and intervention. Potassium will be drawn to ensure that it is an appropriate level prior to performing intervention. 2.  End-stage renal disease requiring hemodialysis:  Patient will continue dialysis therapy without further interruption if a successful intervention is not achieved then a tunneled catheter will be placed. Dialysis has already been arranged. 3.  Hypertension:  Patient will continue medical management; nephrology is following no changes in oral medications. 4. Diabetes mellitus:  Glucose will be monitored and oral medications been held this morning once the patient has undergone the patient's procedure po intake will be reinitiated and again Accu-Cheks will be used to assess the blood glucose level and treat as needed. The patient will be restarted on the patient's usual hypoglycemic regime     Leotis Pain, MD  10/09/2017 11:07 AM

## 2017-10-09 NOTE — Anesthesia Postprocedure Evaluation (Signed)
Anesthesia Post Note  Patient: [name]  Procedure(s) Performed: A/V FISTULAGRAM (Right )  Patient location during evaluation: PACU Anesthesia Type: General Level of consciousness: awake and alert Pain management: pain level controlled Vital Signs Assessment: post-procedure vital signs reviewed and stable Respiratory status: spontaneous breathing, nonlabored ventilation, respiratory function stable and patient connected to nasal cannula oxygen Cardiovascular status: blood pressure returned to baseline and stable Postop Assessment: no apparent nausea or vomiting Anesthetic complications: no     Last Vitals:  Vitals:   10/09/17 1409 10/09/17 1435  BP: 122/78 128/78  Pulse:  100  Resp: 16 10  Temp: 36.7 C 36.5 C  SpO2: 100% 100%    Last Pain:  Vitals:   10/09/17 1424  TempSrc:   PainSc: 0-No pain                 Durenda Hurt

## 2017-10-10 ENCOUNTER — Encounter: Payer: Self-pay | Admitting: Vascular Surgery

## 2017-10-18 ENCOUNTER — Ambulatory Visit (INDEPENDENT_AMBULATORY_CARE_PROVIDER_SITE_OTHER): Payer: Medicare Other | Admitting: Vascular Surgery

## 2017-10-18 ENCOUNTER — Encounter (INDEPENDENT_AMBULATORY_CARE_PROVIDER_SITE_OTHER): Payer: Medicare Other

## 2017-11-08 ENCOUNTER — Encounter (INDEPENDENT_AMBULATORY_CARE_PROVIDER_SITE_OTHER): Payer: Self-pay | Admitting: Nurse Practitioner

## 2017-11-08 ENCOUNTER — Encounter (INDEPENDENT_AMBULATORY_CARE_PROVIDER_SITE_OTHER): Payer: Self-pay

## 2017-11-08 ENCOUNTER — Ambulatory Visit (INDEPENDENT_AMBULATORY_CARE_PROVIDER_SITE_OTHER): Payer: Medicare Other

## 2017-11-08 ENCOUNTER — Ambulatory Visit (INDEPENDENT_AMBULATORY_CARE_PROVIDER_SITE_OTHER): Payer: Medicare Other | Admitting: Nurse Practitioner

## 2017-11-08 ENCOUNTER — Other Ambulatory Visit (INDEPENDENT_AMBULATORY_CARE_PROVIDER_SITE_OTHER): Payer: Self-pay | Admitting: Vascular Surgery

## 2017-11-08 VITALS — BP 145/98 | HR 91 | Resp 18 | Ht 64.0 in | Wt 243.0 lb

## 2017-11-08 DIAGNOSIS — N186 End stage renal disease: Secondary | ICD-10-CM

## 2017-11-08 DIAGNOSIS — E1122 Type 2 diabetes mellitus with diabetic chronic kidney disease: Secondary | ICD-10-CM | POA: Diagnosis not present

## 2017-11-08 DIAGNOSIS — I120 Hypertensive chronic kidney disease with stage 5 chronic kidney disease or end stage renal disease: Secondary | ICD-10-CM | POA: Diagnosis not present

## 2017-11-08 DIAGNOSIS — Z9862 Peripheral vascular angioplasty status: Secondary | ICD-10-CM | POA: Diagnosis not present

## 2017-11-08 DIAGNOSIS — I10 Essential (primary) hypertension: Secondary | ICD-10-CM

## 2017-11-08 DIAGNOSIS — Z992 Dependence on renal dialysis: Secondary | ICD-10-CM

## 2017-11-08 DIAGNOSIS — F17210 Nicotine dependence, cigarettes, uncomplicated: Secondary | ICD-10-CM

## 2017-11-08 NOTE — Progress Notes (Signed)
Subjective:    Patient ID: [name], female    DOB: 21-Jul-1985, 32 y.o.   MRN: [id number] Chief Complaint  Patient presents with  . Follow-up    4 week HDA follow up    HPI  [name] is a 32 y.o. female that returns to the office for follow up regarding problems with the dialysis access. Currently the patient is maintained via a Right Perm Cath  The patient has had multiple failed upper extremity accesses.  The patient recently had a brachiobasilic AV fistula created on 06/28/2017.  She had a right basilic transposition on 08/16/2017.  She recently had a fistulogram on 10/09/2017.  The patient denies hand pain or other symptoms consistent with steal phenomena.  No significant arm swelling.  The patient denies redness or swelling at the access site. The patient denies fever or chills at home or while on dialysis.  The patient denies amaurosis fugax or recent TIA symptoms. There are no recent neurological changes noted. The patient denies claudication symptoms or rest pain symptoms. The patient denies history of DVT, PE or superficial thrombophlebitis. The patient denies recent episodes of angina or shortness of breath.   Today [name] underwent a hemodialysis duplex which revealed a flow volume of 561.  There are significant focal velocity increases noted in the outflow vein of the anastomosis at the basilic axillary confluence.  There is a change in diameter from 0.55 cm to 0.37 cm.  The patient has no prior hemodialysis access duplex to compare from.    Review of Systems   Review of Systems: Negative Unless Checked Constitutional: [] Weight loss  [] Fever  [] Chills Cardiac: [] Chest pain   ? Atrial Fibrillation  [] Palpitations   [] Shortness of breath when laying flat   [] Shortness of breath with exertion. Vascular:  [] Pain in legs with walking   [] Pain in legs with standing  [] History of DVT   [] Phlebitis   [] Swelling in legs   [] Varicose veins   [] Non-healing  ulcers Pulmonary:   [] Uses home oxygen   [] Productive cough   [] Hemoptysis   [] Wheeze  [] COPD   [] Asthma Neurologic:  [] Dizziness   [] Seizures   [] History of stroke   [] History of TIA  [] Aphasia   [] Vissual changes   [] Weakness or numbness in arm   [] Weakness or numbness in leg Musculoskeletal:   [] Joint swelling   [] Joint pain   [] Low back pain  ? History of Knee Replacement Hematologic:  [] Easy bruising  [] Easy bleeding   [] Hypercoagulable state   [x] Anemic Gastrointestinal:  [] Diarrhea   [] Vomiting  [] Gastroesophageal reflux/heartburn   [] Difficulty swallowing. Genitourinary:  [x] Chronic kidney disease   [] Difficult urination  [] Anuric   [] Blood in urine Skin:  [] Rashes   [] Ulcers  Psychological:  [] History of anxiety   []  History of major depression  ? Memory Difficulties     Objective:   Physical Exam  BP (!) 145/98 (BP Location: Left Arm, Patient Position: Sitting)   Pulse 91   Resp 18   Ht 5\' 4"  (1.626 m)   Wt 243 lb (110.2 kg)   LMP 10/09/2017 Comment: last day of menstrual cycle  BMI 41.71 kg/m   Past Medical History:  Diagnosis Date  . Anemia   . Asthma   . Diabetes mellitus   . Dysrhythmia   . 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 GERD (gastroesophageal reflux disease)   . Hypertension   . Irregular heart beat   . Morbid obesity (  [place])   . Renal insufficiency   . Seizures ([place])   . Sleep apnea      Gen: WD/WN, NAD Head: [place]/AT, No temporalis wasting.  Ear/Nose/Throat: Hearing grossly intact, nares w/o erythema or drainage Eyes: PER, EOMI, sclera nonicteric.  Neck: Supple, no masses.  No JVD.  Pulmonary:  Good air movement, no use of accessory muscles.  Cardiac: RRR Vascular:  Acceptable thrill felt, bruit auscultated however harsh near the proximal upper arm Vessel Right Left  Radial Palpable Palpable   Gastrointestinal: soft, non-distended. No guarding/no peritoneal signs.  Musculoskeletal: M/S 5/5 throughout.  No deformity or atrophy.    Neurologic: Pain and light touch intact in extremities.  Symmetrical.  Speech is fluent. Motor exam as listed above. Psychiatric: Judgment intact, Mood & affect appropriate for pt's clinical situation. Dermatologic: No Venous rashes. No Ulcers Noted.  No changes consistent with cellulitis. Lymph : No Cervical lymphadenopathy, no lichenification or skin changes of chronic lymphedema.   Social History   Socioeconomic History  . Marital status: Single    Spouse name: Not on file  . Number of children: Not on file  . Years of education: Not on file  . Highest education level: Not on file  Occupational History  . Not on file  Social Needs  . Financial resource strain: Not on file  . Food insecurity:    Worry: Not on file    Inability: Not on file  . Transportation needs:    Medical: Not on file    Non-medical: Not on file  Tobacco Use  . Smoking status: Light Tobacco Smoker    Packs/day: 0.25    Years: 10.00    Pack years: 2.50    Types: Cigarettes  . Smokeless tobacco: Never Used  Substance and Sexual Activity  . Alcohol use: No  . Drug use: No  . Sexual activity: Yes    Birth control/protection: IUD  Lifestyle  . Physical activity:    Days per week: Not on file    Minutes per session: Not on file  . Stress: Not on file  Relationships  . Social connections:    Talks on phone: Not on file    Gets together: Not on file    Attends religious service: Not on file    Active member of club or organization: Not on file    Attends meetings of clubs or organizations: Not on file    Relationship status: Not on file  . Intimate partner violence:    Fear of current or ex partner: Not on file    Emotionally abused: Not on file    Physically abused: Not on file    Forced sexual activity: Not on file  Other Topics Concern  . Not on file  Social History Narrative  . Not on file    Past Surgical History:  Procedure Laterality Date  . A/V FISTULAGRAM Right 10/09/2017    Procedure: A/V FISTULAGRAM;  Surgeon: Algernon Huxley, MD;  Location: [place] [facility];  Service: Cardiovascular;  Laterality: Right;  . AV FISTULA PLACEMENT  08/26/10   Left Radiocephalic AVF  . AV FISTULA PLACEMENT  08/26/2015   Procedure: ARTERIOVENOUS (AV) FISTULA CREATION;  Surgeon: Algernon Huxley, MD;  Location: [facility];  Service: Vascular;;  . AV FISTULA PLACEMENT Right 06/28/2017   Procedure: ARTERIOVENOUS (AV) FISTULA CREATION ( BRACHIAL BASILIC );  Surgeon: Algernon Huxley, MD;  Location: [facility];  Service: Vascular;  Laterality: Right;  . BACK SURGERY    .  BASCILIC VEIN TRANSPOSITION Right 08/16/2017   Procedure: BASCILIC VEIN TRANSPOSITION (2ND STAGE UPPER);  Surgeon: Algernon Huxley, MD;  Location: [facility];  Service: Vascular;  Laterality: Right;  . CARPAL TUNNEL RELEASE Left 05/26/2017   Procedure: CARPAL TUNNEL RELEASE;  Surgeon: Hessie Knows, MD;  Location: [facility];  Service: Orthopedics;  Laterality: Left;  . CARPAL TUNNEL RELEASE Left 06/09/2017   Procedure: CARPAL TUNNEL RELEASE;  Surgeon: Hessie Knows, MD;  Location: [facility];  Service: Orthopedics;  Laterality: Left;  . DIALYSIS/PERMA CATHETER REMOVAL N/A 03/07/2016   Procedure: Dialysis/Perma Catheter Removal;  Surgeon: Algernon Huxley, MD;  Location: [place] [facility];  Service: Cardiovascular;  Laterality: N/A;  . EYE SURGERY     laser photocoagulation  . EYE SURGERY Right 2008   removal of blood clot  . INTRAUTERINE DEVICE (IUD) INSERTION    . PERIPHERAL VASCULAR CATHETERIZATION N/A 07/07/2014   Procedure: A/V Shuntogram/Fistulagram;  Surgeon: Algernon Huxley, MD;  Location: [place] [facility];  Service: Cardiovascular;  Laterality: N/A;  . PERIPHERAL VASCULAR CATHETERIZATION N/A 07/07/2014   Procedure: A/V Shunt Intervention;  Surgeon: Algernon Huxley, MD;  Location: [place] [facility];  Service: Cardiovascular;  Laterality: N/A;  . PERIPHERAL VASCULAR CATHETERIZATION Right 01/12/2015   Procedure: A/V Shuntogram/Fistulagram;   Surgeon: Algernon Huxley, MD;  Location: [place] [facility];  Service: Cardiovascular;  Laterality: Right;  . PERIPHERAL VASCULAR CATHETERIZATION N/A 01/12/2015   Procedure: A/V Shunt Intervention;  Surgeon: Algernon Huxley, MD;  Location: [place] [facility];  Service: Cardiovascular;  Laterality: N/A;  . PERIPHERAL VASCULAR CATHETERIZATION Right 06/08/2015   Procedure: A/V Shuntogram/Fistulagram;  Surgeon: Algernon Huxley, MD;  Location: [place] [facility];  Service: Cardiovascular;  Laterality: Right;  . PERIPHERAL VASCULAR CATHETERIZATION N/A 06/08/2015   Procedure: A/V Shunt Intervention;  Surgeon: Algernon Huxley, MD;  Location: [place] [facility];  Service: Cardiovascular;  Laterality: N/A;  . PERIPHERAL VASCULAR CATHETERIZATION Right 07/14/2015   Procedure: Thrombectomy;  Surgeon: Katha Cabal, MD;  Location: [place] [facility];  Service: Cardiovascular;  Laterality: Right;  . PERIPHERAL VASCULAR CATHETERIZATION N/A 07/14/2015   Procedure: A/V Shuntogram/Fistulagram;  Surgeon: Katha Cabal, MD;  Location: [place] [facility];  Service: Cardiovascular;  Laterality: N/A;  . PERIPHERAL VASCULAR CATHETERIZATION N/A 07/17/2015   Procedure: Thrombectomy;  Surgeon: Katha Cabal, MD;  Location: [place] [facility];  Service: Cardiovascular;  Laterality: N/A;  . PERIPHERAL VASCULAR CATHETERIZATION N/A 07/17/2015   Procedure: Dialysis/Perma Catheter Insertion;  Surgeon: Katha Cabal, MD;  Location: [place] [facility];  Service: Cardiovascular;  Laterality: N/A;  . PERIPHERAL VASCULAR CATHETERIZATION N/A 08/05/2015   Procedure: Thrombectomy;  Surgeon: Katha Cabal, MD;  Location: [place] [facility];  Service: Cardiovascular;  Laterality: N/A;  declot of graft  . PERIPHERAL VASCULAR THROMBECTOMY N/A 05/10/2017   Procedure: PERIPHERAL VASCULAR THROMBECTOMY;  Surgeon: Algernon Huxley, MD;  Location: [place] [facility];  Service: Cardiovascular;  Laterality: N/A;  . TOE AMPUTATION     both  big toes removed    Family History  Problem Relation Age of Onset  . Diabetes Mother   . Heart attack Mother   . Stroke Mother   . High Cholesterol Mother   . Diabetes Father     Allergies  Allergen Reactions  . Marcillin [Ampicillin] Rash and Other (See Comments)    Breathing and heart palp  Hot and sweating Has patient had a PCN reaction causing immediate  rash, facial/tongue/throat swelling, SOB or lightheadedness with hypotension: Yes Has patient had a PCN reaction causing severe rash involving mucus membranes or skin necrosis: No Has patient had a PCN reaction that required hospitalization Unknown Has patient had a PCN reaction occurring within the last 10 years: Yes If all of the above answers are "NO", then may proceed with Cephalosporin use.   [name] Kitchen Penicillins Other (See Comments)    Heart races  . Latex Swelling and Rash  . Vancomycin Itching, Nausea Only and Rash    Thick pieces of skin pulls off.       Assessment & Plan:  1. End stage renal disease ([place]) Recommend:  The patient is experiencing increasing problems with their dialysis access.  Patient should have a fistulagram with the intention for intervention.  The intention for intervention is to restore appropriate flow and prevent thrombosis and possible loss of the access.  As well as improve the quality of dialysis therapy.  The risks, benefits and alternative therapies were reviewed in detail with the patient.  All questions were answered.  The patient agrees to proceed with angio/intervention.      2. Essential hypertension, benign Continue antihypertensive medications as already ordered, these medications have been reviewed and there are no changes at this time.   3. Type 2 diabetes mellitus with chronic kidney disease on chronic dialysis, unspecified whether long term insulin use ([place]) Continue hypoglycemic medications as already ordered, these medications have been reviewed and there are no changes at  this time.  Hgb A1C to be monitored as already arranged by primary service    Current Outpatient Medications on File Prior to Visit  Medication Sig Dispense Refill  . aspirin 81 MG tablet Take 81 mg by mouth daily.      . B Complex-C-Folic Acid (RENA-VITE RX) 1 MG TABS Take by mouth.    . calcitRIOL (ROCALTROL) 0.5 MCG capsule TAKE 6 CAPSULES BY MOUTH ONCE DAILY FOR 21 DAYS - PLEASE TAKE OTC. VITAMIN D. AFTER THIS AT DOSE RECOMMENDED BY PRIMARY CARE DOCTOR    . calcium acetate, Phos Binder, (PHOSLYRA) 667 MG/5ML SOLN TK 15ML PO TID WITH MEALS    . cinacalcet (SENSIPAR) 30 MG tablet Take 30 mg by mouth See admin instructions. Given at dialysis Tue Thu Sat    . docusate sodium (COLACE) 100 MG capsule Take by mouth.    . fentaNYL (DURAGESIC - DOSED MCG/HR) 12 MCG/HR Place 12.5 mcg onto the skin every 3 (three) days.    [name] Kitchen FOSRENOL 1000 MG PACK Take 1 packet by mouth 3 (three) times daily. Mix 1 packet with applesauce or similar food. Eat Immediately 3 times daily with meals  6  . insulin glargine (LANTUS) 100 UNIT/ML injection Inject 6 Units into the skin daily as needed (high blood sugar). If blood sugar is over 140    . LORazepam (ATIVAN) 2 MG/ML injection Inject into the vein.    . midodrine (PROAMATINE) 10 MG tablet Take 10 mg by mouth 2 (two) times daily.    . Multiple Vitamins-Minerals (MULTIVITAMIN WITH MINERALS) tablet Take by mouth.    [name] Kitchen omeprazole (PRILOSEC) 20 MG capsule Take 20 mg by mouth daily.    [name] Kitchen oxyCODONE-acetaminophen (ROXICET) 5-325 MG tablet Take 1-2 tablets by mouth every 4 (four) hours as needed. 30 tablet 0  . pravastatin (PRAVACHOL) 40 MG tablet Take 40 mg by mouth at bedtime.     . promethazine (PHENERGAN) 12.5 MG tablet Take 12.5 mg by mouth every 6 (  six) hours as needed for nausea or vomiting.    . Skin Protectants, Misc. (DERMACERIN) CREA Apply topically.    . vitamin C (ASCORBIC ACID) 500 MG tablet TAKE 1/2 TABLET (250MG ) BY MOUTH DAILY    . zinc sulfate 220 (50  Zn) MG capsule Take by mouth.    [name] Kitchen albuterol (PROVENTIL HFA;VENTOLIN HFA) 108 (90 Base) MCG/ACT inhaler Inhale 2 puffs into the lungs 2 (two) times daily as needed for wheezing or shortness of breath.     . darbepoetin (ARANESP, ALB FREE, SURECLICK) 253 GUY/4.0HK SOLN Inject 200 mcg into the skin every 7 (seven) days. Reported on 07/14/2015    . naloxone (NARCAN) nasal spray 4 mg/0.1 mL USE 1 SPRAY IN ONE NOSTRIL ONCE FOR KNOWN/SUSPECTED OPIOID OVERDOSE MAY REPEAT EVERY 2-3 MINUTES IN ALTERNATING NOSTRIL UNTIL EMS ARRIVES     No current facility-administered medications on file prior to visit.     There are no Patient Instructions on file for this visit. No follow-ups on file.   Kris Hartmann, NP

## 2017-12-03 ENCOUNTER — Other Ambulatory Visit (INDEPENDENT_AMBULATORY_CARE_PROVIDER_SITE_OTHER): Payer: Self-pay | Admitting: Nurse Practitioner

## 2017-12-03 MED ORDER — CLINDAMYCIN PHOSPHATE 300 MG/50ML IV SOLN: 300.0000 mg | Freq: Once | INTRAVENOUS | Status: DC

## 2017-12-04 ENCOUNTER — Ambulatory Visit
Admission: RE | Admit: 2017-12-04 | Discharge: 2017-12-04 | Disposition: A | Discharge status: Home / Self Care | Payer: Medicare Other | Source: Ambulatory Visit | Attending: Vascular Surgery | Admitting: Vascular Surgery

## 2017-12-04 ENCOUNTER — Encounter: Payer: Self-pay | Admitting: Certified Registered Nurse Anesthetist

## 2017-12-04 ENCOUNTER — Encounter
Admission: RE | Disposition: A | Discharge status: Home / Self Care | Payer: Self-pay | Source: Ambulatory Visit | Attending: Vascular Surgery

## 2017-12-04 DIAGNOSIS — N186 End stage renal disease: Secondary | ICD-10-CM | POA: Diagnosis not present

## 2017-12-04 DIAGNOSIS — Z5309 Procedure and treatment not carried out because of other contraindication: Secondary | ICD-10-CM | POA: Diagnosis not present

## 2017-12-04 DIAGNOSIS — E875 Hyperkalemia: Secondary | ICD-10-CM | POA: Insufficient documentation

## 2017-12-04 LAB — GLUCOSE, CAPILLARY: GLUCOSE-CAPILLARY: 79 mg/dL (ref 70–99)

## 2017-12-04 LAB — POTASSIUM (ARMC VASCULAR LAB ONLY): POTASSIUM (ARMC VASCULAR LAB): 5.9 — AB (ref 3.5–5.1)

## 2017-12-04 SURGERY — A/V FISTULAGRAM
Anesthesia: General | Site: Arm Upper | Laterality: Right

## 2017-12-04 MED ORDER — HYDROMORPHONE HCL 1 MG/ML IJ SOLN: 1.0000 mg | Freq: Once | INTRAMUSCULAR | Status: DC | PRN

## 2017-12-04 MED ORDER — ONDANSETRON HCL 4 MG/2ML IJ SOLN: 4.0000 mg | Freq: Four times a day (QID) | INTRAMUSCULAR | Status: DC | PRN

## 2017-12-04 MED ORDER — CLINDAMYCIN PHOSPHATE 300 MG/50ML IV SOLN
INTRAVENOUS | Status: AC
  Filled 2017-12-04: qty 50

## 2017-12-04 MED ORDER — SODIUM CHLORIDE 0.9 % IV SOLN: INTRAVENOUS | Status: DC

## 2017-12-04 NOTE — OR Nursing (Signed)
Serum potassium 5.9 Anesthesia cancelled case. Pt instructed by Dr Lucky Cowboy, via nurse, to go to dialysis today and tomorrow and [facility] office will call with fistulagram reschedule. Pt discharged

## 2017-12-04 NOTE — H&P (Signed)
[facility] History & Physical Update  The patient was interviewed and re-examined.  The patient's previous History and Physical has been reviewed and is unchanged.  There is no change in the plan of care. We plan to proceed with the scheduled procedure.  Leotis Pain, MD  12/04/2017, 8:10 AM

## 2017-12-04 NOTE — Progress Notes (Signed)
OR team as well as DR Lucky Cowboy notified regarding not able to get IV on patient, 4 attempts.

## 2017-12-05 ENCOUNTER — Encounter (INDEPENDENT_AMBULATORY_CARE_PROVIDER_SITE_OTHER): Payer: Self-pay

## 2017-12-12 ENCOUNTER — Other Ambulatory Visit (INDEPENDENT_AMBULATORY_CARE_PROVIDER_SITE_OTHER): Payer: Self-pay | Admitting: Nurse Practitioner

## 2017-12-13 ENCOUNTER — Encounter: Payer: Self-pay | Admitting: Anesthesiology

## 2017-12-13 ENCOUNTER — Encounter
Admission: RE | Disposition: A | Discharge status: Home / Self Care | Payer: Self-pay | Source: Ambulatory Visit | Attending: Vascular Surgery

## 2017-12-13 ENCOUNTER — Ambulatory Visit: Payer: Medicare Other | Admitting: Registered Nurse

## 2017-12-13 ENCOUNTER — Other Ambulatory Visit: Payer: Self-pay

## 2017-12-13 ENCOUNTER — Ambulatory Visit
Admission: RE | Admit: 2017-12-13 | Discharge: 2017-12-13 | Disposition: A | Discharge status: Home / Self Care | Payer: Medicare Other | Source: Ambulatory Visit | Attending: Vascular Surgery | Admitting: Vascular Surgery

## 2017-12-13 DIAGNOSIS — Z5309 Procedure and treatment not carried out because of other contraindication: Secondary | ICD-10-CM | POA: Insufficient documentation

## 2017-12-13 DIAGNOSIS — N186 End stage renal disease: Secondary | ICD-10-CM | POA: Diagnosis not present

## 2017-12-13 DIAGNOSIS — E875 Hyperkalemia: Secondary | ICD-10-CM | POA: Insufficient documentation

## 2017-12-13 LAB — POTASSIUM: POTASSIUM: 6.2 mmol/L — AB (ref 3.5–5.1)

## 2017-12-13 LAB — POTASSIUM (ARMC VASCULAR LAB ONLY): Potassium (ARMC vascular lab): 5.7 — ABNORMAL HIGH (ref 3.5–5.1)

## 2017-12-13 LAB — GLUCOSE, CAPILLARY: Glucose-Capillary: 81 mg/dL (ref 70–99)

## 2017-12-13 SURGERY — A/V FISTULAGRAM
Anesthesia: General | Laterality: Right

## 2017-12-13 MED ORDER — HYDROMORPHONE HCL 1 MG/ML IJ SOLN: 1.0000 mg | Freq: Once | INTRAMUSCULAR | Status: DC | PRN

## 2017-12-13 MED ORDER — CLINDAMYCIN PHOSPHATE 300 MG/50ML IV SOLN
INTRAVENOUS | Status: AC
  Filled 2017-12-13: qty 50

## 2017-12-13 MED ORDER — ONDANSETRON HCL 4 MG/2ML IJ SOLN: 4.0000 mg | Freq: Four times a day (QID) | INTRAMUSCULAR | Status: DC | PRN

## 2017-12-13 MED ORDER — CLINDAMYCIN PHOSPHATE 300 MG/50ML IV SOLN: 300.0000 mg | Freq: Once | INTRAVENOUS | Status: DC

## 2017-12-13 MED ORDER — SODIUM CHLORIDE 0.9 % IV SOLN
INTRAVENOUS | Status: DC
  Administered 2017-12-13: 11:00:00 via INTRAVENOUS

## 2017-12-13 NOTE — Progress Notes (Signed)
Potassium levels elevated today, dr. Lucky Cowboy and Dr. Marcello Moores aware.  Procedure cancelled for today.  Called office to have them reschedule.  Discussed potassium intake levels and foods to avoid that elevate potassium

## 2017-12-13 NOTE — Anesthesia Preprocedure Evaluation (Deleted)
Anesthesia Evaluation  Patient identified by MRN, date of birth, ID band Patient awake    Reviewed: Allergy & Precautions, NPO status , Patient's Chart, lab work & pertinent test results, reviewed documented beta blocker date and time   Airway Mallampati: III  TM Distance: >3 FB     Dental  (+) Chipped   Pulmonary asthma , sleep apnea , Current Smoker,           Cardiovascular hypertension, Pt. on medications + dysrhythmias      Neuro/Psych Seizures -,     GI/Hepatic GERD  ,  Endo/Other  diabetes, Type 2Morbid obesity  Renal/GU ESRFRenal disease     Musculoskeletal   Abdominal   Peds  Hematology  (+) anemia ,   Anesthesia Other Findings g-bypass.EKG ok. duragesic patch. On midodrine.  Reproductive/Obstetrics                            Anesthesia Physical Anesthesia Plan  ASA: III  Anesthesia Plan: General   Post-op Pain Management:    Induction: Intravenous  PONV Risk Score and Plan:   Airway Management Planned: Oral ETT  Additional Equipment:   Intra-op Plan:   Post-operative Plan:   Informed Consent: I have reviewed the patients History and Physical, chart, labs and discussed the procedure including the risks, benefits and alternatives for the proposed anesthesia with the patient or authorized representative who has indicated his/her understanding and acceptance.     Plan Discussed with: CRNA  Anesthesia Plan Comments:        Anesthesia Quick Evaluation

## 2017-12-14 ENCOUNTER — Encounter (INDEPENDENT_AMBULATORY_CARE_PROVIDER_SITE_OTHER): Payer: Self-pay

## 2017-12-21 ENCOUNTER — Encounter: Payer: Self-pay | Admitting: Anesthesiology

## 2017-12-21 ENCOUNTER — Other Ambulatory Visit (INDEPENDENT_AMBULATORY_CARE_PROVIDER_SITE_OTHER): Payer: Self-pay | Admitting: Vascular Surgery

## 2017-12-21 ENCOUNTER — Encounter
Admission: RE | Disposition: A | Discharge status: Home / Self Care | Payer: Self-pay | Source: Ambulatory Visit | Attending: Vascular Surgery

## 2017-12-21 ENCOUNTER — Ambulatory Visit: Payer: Medicare Other | Admitting: Anesthesiology

## 2017-12-21 ENCOUNTER — Ambulatory Visit
Admission: RE | Admit: 2017-12-21 | Discharge: 2017-12-21 | Disposition: A | Discharge status: Home / Self Care | Payer: Medicare Other | Source: Ambulatory Visit | Attending: Vascular Surgery | Admitting: Vascular Surgery

## 2017-12-21 DIAGNOSIS — J45909 Unspecified asthma, uncomplicated: Secondary | ICD-10-CM | POA: Diagnosis not present

## 2017-12-21 DIAGNOSIS — Z880 Allergy status to penicillin: Secondary | ICD-10-CM | POA: Insufficient documentation

## 2017-12-21 DIAGNOSIS — E1122 Type 2 diabetes mellitus with diabetic chronic kidney disease: Secondary | ICD-10-CM | POA: Diagnosis not present

## 2017-12-21 DIAGNOSIS — N186 End stage renal disease: Secondary | ICD-10-CM | POA: Insufficient documentation

## 2017-12-21 DIAGNOSIS — T82858A Stenosis of vascular prosthetic devices, implants and grafts, initial encounter: Secondary | ICD-10-CM | POA: Diagnosis not present

## 2017-12-21 DIAGNOSIS — Z8249 Family history of ischemic heart disease and other diseases of the circulatory system: Secondary | ICD-10-CM | POA: Insufficient documentation

## 2017-12-21 DIAGNOSIS — Z9884 Bariatric surgery status: Secondary | ICD-10-CM | POA: Insufficient documentation

## 2017-12-21 DIAGNOSIS — Z89411 Acquired absence of right great toe: Secondary | ICD-10-CM | POA: Insufficient documentation

## 2017-12-21 DIAGNOSIS — I120 Hypertensive chronic kidney disease with stage 5 chronic kidney disease or end stage renal disease: Secondary | ICD-10-CM | POA: Diagnosis not present

## 2017-12-21 DIAGNOSIS — Z833 Family history of diabetes mellitus: Secondary | ICD-10-CM | POA: Insufficient documentation

## 2017-12-21 DIAGNOSIS — G4730 Sleep apnea, unspecified: Secondary | ICD-10-CM | POA: Insufficient documentation

## 2017-12-21 DIAGNOSIS — Z89412 Acquired absence of left great toe: Secondary | ICD-10-CM | POA: Diagnosis not present

## 2017-12-21 DIAGNOSIS — Z91040 Latex allergy status: Secondary | ICD-10-CM | POA: Insufficient documentation

## 2017-12-21 DIAGNOSIS — Y832 Surgical operation with anastomosis, bypass or graft as the cause of abnormal reaction of the patient, or of later complication, without mention of misadventure at the time of the procedure: Secondary | ICD-10-CM | POA: Diagnosis not present

## 2017-12-21 DIAGNOSIS — K219 Gastro-esophageal reflux disease without esophagitis: Secondary | ICD-10-CM | POA: Diagnosis not present

## 2017-12-21 DIAGNOSIS — Z881 Allergy status to other antibiotic agents status: Secondary | ICD-10-CM | POA: Insufficient documentation

## 2017-12-21 DIAGNOSIS — E11649 Type 2 diabetes mellitus with hypoglycemia without coma: Secondary | ICD-10-CM | POA: Diagnosis not present

## 2017-12-21 DIAGNOSIS — Z6841 Body Mass Index (BMI) 40.0 and over, adult: Secondary | ICD-10-CM | POA: Insufficient documentation

## 2017-12-21 DIAGNOSIS — R0989 Other specified symptoms and signs involving the circulatory and respiratory systems: Secondary | ICD-10-CM | POA: Insufficient documentation

## 2017-12-21 DIAGNOSIS — F17210 Nicotine dependence, cigarettes, uncomplicated: Secondary | ICD-10-CM | POA: Insufficient documentation

## 2017-12-21 DIAGNOSIS — R569 Unspecified convulsions: Secondary | ICD-10-CM | POA: Insufficient documentation

## 2017-12-21 DIAGNOSIS — Z992 Dependence on renal dialysis: Secondary | ICD-10-CM | POA: Insufficient documentation

## 2017-12-21 DIAGNOSIS — Z9889 Other specified postprocedural states: Secondary | ICD-10-CM | POA: Diagnosis not present

## 2017-12-21 DIAGNOSIS — Z823 Family history of stroke: Secondary | ICD-10-CM | POA: Insufficient documentation

## 2017-12-21 HISTORY — PX: A/V FISTULAGRAM: CATH118298

## 2017-12-21 LAB — GLUCOSE, CAPILLARY: GLUCOSE-CAPILLARY: 93 mg/dL (ref 70–99)

## 2017-12-21 LAB — POTASSIUM: POTASSIUM: 3.4 mmol/L — AB (ref 3.5–5.1)

## 2017-12-21 SURGERY — A/V FISTULAGRAM
Anesthesia: General | Laterality: Right

## 2017-12-21 MED ORDER — ONDANSETRON HCL 4 MG/2ML IJ SOLN: 4.0000 mg | Freq: Four times a day (QID) | INTRAMUSCULAR | Status: DC | PRN

## 2017-12-21 MED ORDER — HEPARIN SODIUM (PORCINE) 1000 UNIT/ML IJ SOLN: INTRAMUSCULAR | Status: DC | PRN

## 2017-12-21 MED ORDER — LIDOCAINE-EPINEPHRINE (PF) 1 %-1:200000 IJ SOLN
INTRAMUSCULAR | Status: AC
  Filled 2017-12-21: qty 10

## 2017-12-21 MED ORDER — HEPARIN (PORCINE) IN NACL 1000-0.9 UT/500ML-% IV SOLN
INTRAVENOUS | Status: AC
  Filled 2017-12-21: qty 1000

## 2017-12-21 MED ORDER — CLINDAMYCIN PHOSPHATE 300 MG/50ML IV SOLN
INTRAVENOUS | Status: AC
  Filled 2017-12-21: qty 50

## 2017-12-21 MED ORDER — IOPAMIDOL (ISOVUE-300) INJECTION 61%
INTRAVENOUS | Status: DC | PRN
  Administered 2017-12-21: 25 mL via INTRAVENOUS

## 2017-12-21 MED ORDER — MIDAZOLAM HCL 2 MG/2ML IJ SOLN
INTRAMUSCULAR | Status: AC
  Filled 2017-12-21: qty 2

## 2017-12-21 MED ORDER — FENTANYL CITRATE (PF) 100 MCG/2ML IJ SOLN
25.0000 ug | INTRAMUSCULAR | Status: DC | PRN
  Administered 2017-12-21: 50 ug via INTRAVENOUS

## 2017-12-21 MED ORDER — PROPOFOL 10 MG/ML IV BOLUS
INTRAVENOUS | Status: AC
  Filled 2017-12-21: qty 20

## 2017-12-21 MED ORDER — MIDAZOLAM HCL 2 MG/2ML IJ SOLN
INTRAMUSCULAR | Status: DC | PRN
  Administered 2017-12-21: 2 mg via INTRAVENOUS

## 2017-12-21 MED ORDER — PHENYLEPHRINE HCL 10 MG/ML IJ SOLN
INTRAMUSCULAR | Status: DC | PRN
  Administered 2017-12-21 (×3): 100 ug via INTRAVENOUS

## 2017-12-21 MED ORDER — LIDOCAINE HCL (CARDIAC) PF 100 MG/5ML IV SOSY
PREFILLED_SYRINGE | INTRAVENOUS | Status: DC | PRN
  Administered 2017-12-21: 100 mg via INTRAVENOUS

## 2017-12-21 MED ORDER — ONDANSETRON HCL 4 MG/2ML IJ SOLN: 4.0000 mg | Freq: Once | INTRAMUSCULAR | Status: DC | PRN

## 2017-12-21 MED ORDER — FAMOTIDINE 20 MG PO TABS: 40.0000 mg | ORAL_TABLET | ORAL | Status: DC | PRN

## 2017-12-21 MED ORDER — CLINDAMYCIN PHOSPHATE 300 MG/50ML IV SOLN
300.0000 mg | Freq: Once | INTRAVENOUS | Status: AC
  Administered 2017-12-21: 300 mg via INTRAVENOUS

## 2017-12-21 MED ORDER — METHYLPREDNISOLONE SODIUM SUCC 125 MG IJ SOLR: 125.0000 mg | INTRAMUSCULAR | Status: DC | PRN

## 2017-12-21 MED ORDER — SODIUM CHLORIDE 0.9 % IV SOLN
INTRAVENOUS | Status: DC
  Administered 2017-12-21: 13:00:00 via INTRAVENOUS

## 2017-12-21 MED ORDER — SODIUM CHLORIDE 0.9 % IV BOLUS
200.0000 mL | Freq: Once | INTRAVENOUS | Status: AC
  Administered 2017-12-21: 200 mL via INTRAVENOUS

## 2017-12-21 MED ORDER — HYDROMORPHONE HCL 1 MG/ML IJ SOLN: 1.0000 mg | Freq: Once | INTRAMUSCULAR | Status: DC | PRN

## 2017-12-21 MED ORDER — HEPARIN SODIUM (PORCINE) 1000 UNIT/ML IJ SOLN
INTRAMUSCULAR | Status: DC | PRN
  Administered 2017-12-21: 3000 [IU] via INTRAVENOUS

## 2017-12-21 MED ORDER — FENTANYL CITRATE (PF) 100 MCG/2ML IJ SOLN
INTRAMUSCULAR | Status: AC
  Filled 2017-12-21: qty 2

## 2017-12-21 SURGICAL SUPPLY — 11 items
BALLN LUTONIX 7X80X130 (BALLOONS) ×3
BALLOON LUTONIX 7X80X130 (BALLOONS) ×1 IMPLANT
CANNULA 5F STIFF (CANNULA) ×3 IMPLANT
COVER PROBE U/S 5X48 (MISCELLANEOUS) ×3 IMPLANT
DEVICE PRESTO INFLATION (MISCELLANEOUS) ×3 IMPLANT
DRAPE BRACHIAL (DRAPES) ×3 IMPLANT
PACK ANGIOGRAPHY (CUSTOM PROCEDURE TRAY) ×3 IMPLANT
SHEATH BRITE TIP 6FRX5.5 (SHEATH) ×3 IMPLANT
SUT MNCRL AB 4-0 PS2 18 (SUTURE) ×3 IMPLANT
TOWEL OR 17X26 4PK STRL BLUE (TOWEL DISPOSABLE) ×3 IMPLANT
WIRE MAGIC TOR.035 180C (WIRE) ×3 IMPLANT

## 2017-12-21 NOTE — Addendum Note (Signed)
Addendum  created 12/21/17 2011 by Gunnar Bulla, MD   Sign clinical note

## 2017-12-21 NOTE — Anesthesia Postprocedure Evaluation (Signed)
Anesthesia Post Note  Patient: [name]  Procedure(s) Performed: A/V FISTULAGRAM (Right )  Patient location during evaluation: Cath Lab Anesthesia Type: General Level of consciousness: awake and alert Pain management: pain level controlled Vital Signs Assessment: post-procedure vital signs reviewed and stable Respiratory status: spontaneous breathing, nonlabored ventilation, respiratory function stable and patient connected to nasal cannula oxygen Cardiovascular status: blood pressure returned to baseline and stable Postop Assessment: no apparent nausea or vomiting Anesthetic complications: no     Last Vitals:  Vitals:   12/21/17 1608 12/21/17 1614  BP: 122/60 129/64  Pulse: (!) 103 99  Resp: 20 20  Temp:    SpO2: 98% 99%    Last Pain:  Vitals:   12/21/17 1614  PainSc: 0-No pain                 [name]

## 2017-12-21 NOTE — Anesthesia Preprocedure Evaluation (Signed)
Anesthesia Evaluation  Patient identified by MRN, date of birth, ID band Patient awake    Reviewed: Allergy & Precautions, NPO status , Patient's Chart, lab work & pertinent test results, reviewed documented beta blocker date and time   Airway Mallampati: III  TM Distance: >3 FB     Dental  (+) Chipped   Pulmonary asthma , sleep apnea , Current Smoker,           Cardiovascular hypertension, Pt. on medications      Neuro/Psych Seizures -,     GI/Hepatic GERD  Controlled,  Endo/Other  diabetes, Type 2Morbid obesity  Renal/GU ESRFRenal disease     Musculoskeletal   Abdominal   Peds  Hematology  (+) anemia ,   Anesthesia Other Findings Smokes.duragesic patch. Hx gastric bypass. EKG ok.  Reproductive/Obstetrics                             Anesthesia Physical Anesthesia Plan  ASA: III  Anesthesia Plan: General   Post-op Pain Management:    Induction: Intravenous  PONV Risk Score and Plan:   Airway Management Planned: Oral ETT and LMA  Additional Equipment:   Intra-op Plan:   Post-operative Plan:   Informed Consent: I have reviewed the patients History and Physical, chart, labs and discussed the procedure including the risks, benefits and alternatives for the proposed anesthesia with the patient or authorized representative who has indicated his/her understanding and acceptance.     Plan Discussed with: CRNA  Anesthesia Plan Comments:         Anesthesia Quick Evaluation

## 2017-12-21 NOTE — Op Note (Signed)
[facility]    OPERATIVE NOTE   PROCEDURE: 1.   Right brachiobasilic arteriovenous fistula cannulation under ultrasound guidance 2.   Right arm fistulagram including central venogram 3.   Percutaneous transluminal angioplasty of right proximal to mid upper arm basilic vein with 7 mm diameter by 8 cm length Lutonix drug-coated angioplasty balloon  PRE-OPERATIVE DIAGNOSIS: 1. ESRD 2. Poorly functional right brachiobasilic AVF  POST-OPERATIVE DIAGNOSIS: same as above   SURGEON: Leotis Pain, MD  ANESTHESIA: local with MCS  ESTIMATED BLOOD LOSS: 3 cc  FINDING(S): 1. Mild stenosis in the perianastomotic basilic vein that appeared to be less than 50%.  As the basilic vein curved and transition down to the axillary vein there was a 75 to 80% stenosis.  The vein then normalized and had only mild disease throughout the remainder of the upper arm fistula as well as the central venous circulation.  SPECIMEN(S):  None  CONTRAST: 25 cc  FLUORO TIME: 0.6 minutes  ANESTHESIA: General  INDICATIONS: [name] is a 32 y.o. female who presents with malfunctioning right brachiobasilic arteriovenous fistula.  The patient is scheduled for right arm fistulagram.  The patient is aware the risks include but are not limited to: bleeding, infection, thrombosis of the cannulated access, and possible anaphylactic reaction to the contrast.  The patient is aware of the risks of the procedure and elects to proceed forward.  DESCRIPTION: After full informed written consent was obtained, the patient was brought back to the angiography suite and placed supine upon the angiography table.  The patient was connected to monitoring equipment. Moderate conscious sedation was administered with a face to face encounter with the patient throughout the procedure with my supervision of the RN administering medicines and monitoring the patient's vital signs and mental status throughout from the  start of the procedure until the patient was taken to the recovery room. The right arm was prepped and draped in the standard fashion for a percutaneous access intervention.  Under ultrasound guidance, the right brachiobasilic arteriovenous fistula was cannulated with a micropuncture needle under direct ultrasound guidance where it was patent and a permanent image was performed.  The microwire was advanced into the fistula and the needle was exchanged for the a microsheath.  I then upsized to a 6 Fr Sheath and imaging was performed.  Hand injections were completed to image the access including the central venous system. This demonstrated mild stenosis in the perianastomotic basilic vein that appeared to be less than 50%.  As the basilic vein curved and transition down to the axillary vein there was a 75 to 80% stenosis.  The vein then normalized and had only mild disease throughout the remainder of the upper arm fistula as well as the central venous circulation.  Based on the images, this patient will need intervention to the basilic vein stenosis in the mid to proximal upper arm. I then gave the patient 3000 units of intravenous heparin.  I then crossed the stenosis with a Magic Tourqe wire.  Based on the imaging, a 7 mm x 8 cm Lutonix drug-coated angioplasty balloon was selected.  The balloon was centered around the basilic vein stenosis and inflated to 12 ATM for 1 minute(s).  On completion imaging, a 10-15 % residual stenosis was present.     Based on the completion imaging, no further intervention is necessary.  The wire and balloon were removed from the sheath.  A 4-0 Monocryl purse-string suture was sewn around the sheath.  The sheath was removed while tying down the suture.  A sterile bandage was applied to the puncture site.  COMPLICATIONS: None  CONDITION: Stable   Leotis Pain  12/21/2017 2:26 PM   This note was created with Dragon Medical transcription system. Any errors in dictation are  purely unintentional.

## 2017-12-21 NOTE — H&P (Signed)
[facility] Admission History & Physical  MRN : [id number]  [name] is a 32 y.o. (02/08/1985) female who presents with chief complaint of No chief complaint on file. [name] Kitchen  History of Present Illness: I am asked to evaluate the patient by the dialysis center. The patient was sent here because they were unable to achieve adequate dialysis this morning. Furthermore the Center states there is very poor thrill and bruit. The patient states there there have been increasing problems with the access, such as "pulling clots" during dialysis and prolonged bleeding after decannulation. The patient estimates these problems have been going on for several weeks. The patient is unaware of any other change.  Patient denies pain or tenderness overlying the access.  There is no pain with dialysis.  The patient denies hand pain or finger pain consistent with steal syndrome.   There have been past interventions or declots of this access.  The patient is not chronically hypotensive on dialysis. She has been having issues with high potassium  No current facility-administered medications for this encounter.     Past Medical History:  Diagnosis Date  . Anemia   . Asthma   . Diabetes mellitus   . Dysrhythmia   . 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 GERD (gastroesophageal reflux disease)   . Hypertension   . Irregular heart beat   . Morbid obesity ([place])   . Renal insufficiency   . Seizures ([place])   . Sleep apnea     Past Surgical History:  Procedure Laterality Date  . A/V FISTULAGRAM Right 10/09/2017   Procedure: A/V FISTULAGRAM;  Surgeon: Algernon Huxley, MD;  Location: [facility];  Service: Cardiovascular;  Laterality: Right;  . AV FISTULA PLACEMENT  08/26/10   Left Radiocephalic AVF  . AV FISTULA PLACEMENT  08/26/2015   Procedure: ARTERIOVENOUS (AV) FISTULA CREATION;  Surgeon: Algernon Huxley, MD;  Location: [facility];  Service: Vascular;;  . AV FISTULA  PLACEMENT Right 06/28/2017   Procedure: ARTERIOVENOUS (AV) FISTULA CREATION ( BRACHIAL BASILIC );  Surgeon: Algernon Huxley, MD;  Location: [facility];  Service: Vascular;  Laterality: Right;  . BACK SURGERY    . BASCILIC VEIN TRANSPOSITION Right 08/16/2017   Procedure: BASCILIC VEIN TRANSPOSITION (2ND STAGE UPPER);  Surgeon: Algernon Huxley, MD;  Location: [facility];  Service: Vascular;  Laterality: Right;  . CARPAL TUNNEL RELEASE Left 05/26/2017   Procedure: CARPAL TUNNEL RELEASE;  Surgeon: Hessie Knows, MD;  Location: [facility];  Service: Orthopedics;  Laterality: Left;  . CARPAL TUNNEL RELEASE Left 06/09/2017   Procedure: CARPAL TUNNEL RELEASE;  Surgeon: Hessie Knows, MD;  Location: [facility];  Service: Orthopedics;  Laterality: Left;  . DIALYSIS/PERMA CATHETER REMOVAL N/A 03/07/2016   Procedure: Dialysis/Perma Catheter Removal;  Surgeon: Algernon Huxley, MD;  Location: [facility];  Service: Cardiovascular;  Laterality: N/A;  . EYE SURGERY     laser photocoagulation  . EYE SURGERY Right 2008   removal of blood clot  . INTRAUTERINE DEVICE (IUD) INSERTION    . PERIPHERAL VASCULAR CATHETERIZATION N/A 07/07/2014   Procedure: A/V Shuntogram/Fistulagram;  Surgeon: Algernon Huxley, MD;  Location: [facility];  Service: Cardiovascular;  Laterality: N/A;  . PERIPHERAL VASCULAR CATHETERIZATION N/A 07/07/2014   Procedure: A/V Shunt Intervention;  Surgeon: Algernon Huxley, MD;  Location: [facility];  Service: Cardiovascular;  Laterality: N/A;  . PERIPHERAL VASCULAR CATHETERIZATION Right 01/12/2015   Procedure:  A/V Shuntogram/Fistulagram;  Surgeon: Algernon Huxley, MD;  Location: [facility];  Service: Cardiovascular;  Laterality: Right;  . PERIPHERAL VASCULAR CATHETERIZATION N/A 01/12/2015   Procedure: A/V Shunt Intervention;  Surgeon: Algernon Huxley, MD;  Location: [facility];  Service: Cardiovascular;  Laterality: N/A;  . PERIPHERAL VASCULAR CATHETERIZATION Right 06/08/2015   Procedure:  A/V Shuntogram/Fistulagram;  Surgeon: Algernon Huxley, MD;  Location: [facility];  Service: Cardiovascular;  Laterality: Right;  . PERIPHERAL VASCULAR CATHETERIZATION N/A 06/08/2015   Procedure: A/V Shunt Intervention;  Surgeon: Algernon Huxley, MD;  Location: [facility];  Service: Cardiovascular;  Laterality: N/A;  . PERIPHERAL VASCULAR CATHETERIZATION Right 07/14/2015   Procedure: Thrombectomy;  Surgeon: Katha Cabal, MD;  Location: [facility];  Service: Cardiovascular;  Laterality: Right;  . PERIPHERAL VASCULAR CATHETERIZATION N/A 07/14/2015   Procedure: A/V Shuntogram/Fistulagram;  Surgeon: Katha Cabal, MD;  Location: [facility];  Service: Cardiovascular;  Laterality: N/A;  . PERIPHERAL VASCULAR CATHETERIZATION N/A 07/17/2015   Procedure: Thrombectomy;  Surgeon: Katha Cabal, MD;  Location: [facility];  Service: Cardiovascular;  Laterality: N/A;  . PERIPHERAL VASCULAR CATHETERIZATION N/A 07/17/2015   Procedure: Dialysis/Perma Catheter Insertion;  Surgeon: Katha Cabal, MD;  Location: [facility];  Service: Cardiovascular;  Laterality: N/A;  . PERIPHERAL VASCULAR CATHETERIZATION N/A 08/05/2015   Procedure: Thrombectomy;  Surgeon: Katha Cabal, MD;  Location: [facility];  Service: Cardiovascular;  Laterality: N/A;  declot of graft  . PERIPHERAL VASCULAR THROMBECTOMY N/A 05/10/2017   Procedure: PERIPHERAL VASCULAR THROMBECTOMY;  Surgeon: Algernon Huxley, MD;  Location: [facility];  Service: Cardiovascular;  Laterality: N/A;  . TOE AMPUTATION     both big toes removed    Social History Social History   Tobacco Use  . Smoking status: Light Tobacco Smoker    Packs/day: 0.25    Years: 10.00    Pack years: 2.50    Types: Cigarettes  . Smokeless tobacco: Never Used  Substance Use Topics  . Alcohol use: No  . Drug use: No    Family History Family History  Problem Relation Age of Onset  . Diabetes  Mother   . Heart attack Mother   . Stroke Mother   . High Cholesterol Mother   . Diabetes Father     No family history of bleeding or clotting disorders, autoimmune disease or porphyria  Allergies  Allergen Reactions  . Marcillin [Ampicillin] Rash and Other (See Comments)    Breathing and heart palp  Hot and sweating Has patient had a PCN reaction causing immediate rash, facial/tongue/throat swelling, SOB or lightheadedness with hypotension: Yes Has patient had a PCN reaction causing severe rash involving mucus membranes or skin necrosis: No Has patient had a PCN reaction that required hospitalization Unknown Has patient had a PCN reaction occurring within the last 10 years: Yes If all of the above answers are "NO", then may proceed with Cephalosporin use.   [name] Kitchen Penicillins Other (See Comments)    Heart races  . Latex Swelling and Rash  . Vancomycin Itching, Nausea Only and Rash    Thick pieces of skin pulls off.     REVIEW OF SYSTEMS (Negative unless checked)  Constitutional: [] Weight loss  [] Fever  [] Chills Cardiac: [] Chest pain   [] Chest pressure   [] Palpitations   [] Shortness of breath when laying flat   [] Shortness of breath at rest   [x] Shortness of  breath with exertion. Vascular:  [] Pain in legs with walking   [] Pain in legs at rest   [] Pain in legs when laying flat   [] Claudication   [] Pain in feet when walking  [] Pain in feet at rest  [] Pain in feet when laying flat   [] History of DVT   [] Phlebitis   [] Swelling in legs   [] Varicose veins   [] Non-healing ulcers Pulmonary:   [] Uses home oxygen   [] Productive cough   [] Hemoptysis   [] Wheeze  [] COPD   [] Asthma Neurologic:  [] Dizziness  [] Blackouts   [x] Seizures   [] History of stroke   [] History of TIA  [] Aphasia   [] Temporary blindness   [] Dysphagia   [] Weakness or numbness in arms   [] Weakness or numbness in legs Musculoskeletal:  [] Arthritis   [] Joint swelling   [] Joint pain   [] Low back pain Hematologic:  [] Easy bruising   [] Easy bleeding   [] Hypercoagulable state   [x] Anemic  [] Hepatitis Gastrointestinal:  [] Blood in stool   [] Vomiting blood  [x] Gastroesophageal reflux/heartburn   [] Difficulty swallowing. Genitourinary:  [x] Chronic kidney disease   [] Difficult urination  [] Frequent urination  [] Burning with urination   [] Blood in urine Skin:  [] Rashes   [] Ulcers   [] Wounds Psychological:  [] History of anxiety   []  History of major depression.  Physical Examination  There were no vitals filed for this visit. There is no height or weight on file to calculate BMI. Gen: WD/WN, NAD Head: [place]/AT, No temporalis wasting.  Ear/Nose/Throat: Hearing grossly intact, nares w/o erythema or drainage, oropharynx w/o Erythema/Exudate,  Eyes: Conjunctiva clear, sclera non-icteric Neck: Trachea midline.  No JVD.  Pulmonary:  Good air movement, respirations not labored, no use of accessory muscles.  Cardiac: RRR, normal S1, S2. Vascular: poor thrill right arm AVF Vessel Right Left  Radial Palpable Palpable       Musculoskeletal: M/S 5/5 throughout.  Extremities without ischemic changes.  No deformity or atrophy.  Neurologic: Sensation grossly intact in extremities.  Symmetrical.  Speech is fluent. Motor exam as listed above. Psychiatric: Judgment intact, Mood & affect appropriate for pt's clinical situation. Dermatologic: No rashes or ulcers noted.  No cellulitis or open wounds.    CBC Lab Results  Component Value Date   WBC 8.3 08/16/2017   HGB 12.3 08/16/2017   HCT 37.7 08/16/2017   MCV 90.3 08/16/2017   PLT 202 08/16/2017    BMET    Component Value Date/Time   NA 139 08/16/2017 0554   NA 132 (L) 02/03/2014 1007   K 6.2 (H) 12/13/2017 1220   K 4.7 02/03/2014 1007   CL 99 08/16/2017 0554   CL 93 (L) 02/03/2014 1007   CO2 28 08/16/2017 0554   CO2 25 02/03/2014 1007   GLUCOSE 111 (H) 08/16/2017 0554   GLUCOSE 258 (H) 02/03/2014 1007   BUN 38 (H) 08/16/2017 0554   BUN 58 (H) 02/03/2014 1007   CREATININE  8.28 (H) 08/16/2017 0554   CREATININE 11.14 (H) 02/03/2014 1007   CALCIUM 8.0 (L) 08/16/2017 0554   CALCIUM 8.4 (L) 02/03/2014 1007   CALCIUM 8.1 (L) 08/23/2010 1000   GFRNONAA 6 (L) 08/16/2017 0554   GFRNONAA 4 (L) 02/03/2014 1007   GFRNONAA 5 (L) 07/19/2013 0416   GFRAA 7 (L) 08/16/2017 0554   GFRAA 5 (L) 02/03/2014 1007   GFRAA 6 (L) 07/19/2013 0416   CrCl cannot be calculated (Patient's most recent lab result is older than the maximum 21 days allowed.).  COAG Lab Results  Component Value  Date   INR 1.05 08/16/2017   INR 1.07 06/28/2017   INR 1.11 08/19/2015    Radiology No results found.  Assessment/Plan 1.  Complication dialysis device with poorly functioning AV access:  Patient's dialysis access is malfunctioning. The patient will undergo angiography and correction of any problems using interventional techniques with the hope of restoring function to the access.  The risks and benefits were described to the patient.  All questions were answered.  The patient agrees to proceed with angiography and intervention. Potassium will be drawn to ensure that it is an appropriate level prior to performing intervention. 2.  End-stage renal disease requiring hemodialysis:  Patient will continue dialysis therapy without further interruption if a successful intervention is not achieved then a tunneled catheter will be placed. Dialysis has already been arranged. 3.  Hypertension:  Patient will continue medical management; nephrology is following no changes in oral medications. 4.  Diabetes mellitus:  Glucose will be monitored and oral medications been held this morning once the patient has undergone the patient's procedure po intake will be reinitiated and again Accu-Cheks will be used to assess the blood glucose level and treat as needed. The patient will be restarted on the patient's usual hypoglycemic regime     Leotis Pain, MD  12/21/2017 11:07 AM

## 2017-12-21 NOTE — Anesthesia Procedure Notes (Signed)
Procedure Name: LMA Insertion Date/Time: 12/21/2017 2:17 PM Performed by: Nelda Marseille, CRNA Pre-anesthesia Checklist: Patient identified, Patient being monitored, Timeout performed, Emergency Drugs available and Suction available Patient Re-evaluated:Patient Re-evaluated prior to induction Oxygen Delivery Method: Circle system utilized Preoxygenation: Pre-oxygenation with 100% oxygen Induction Type: IV induction Ventilation: Mask ventilation without difficulty LMA: LMA inserted LMA Size: 3.5 Tube type: Oral Number of attempts: 1 Placement Confirmation: positive ETCO2 and breath sounds checked- equal and bilateral Tube secured with: Tape Dental Injury: Teeth and Oropharynx as per pre-operative assessment

## 2017-12-21 NOTE — Transfer of Care (Signed)
Immediate Anesthesia Transfer of Care Note  Patient: [name]  Procedure(s) Performed: A/V FISTULAGRAM (Right )  Patient Location: PACU  Anesthesia Type:General  Level of Consciousness: awake, alert  and oriented  Airway & Oxygen Therapy: Patient Spontanous Breathing and Patient connected to face mask oxygen  Post-op Assessment: Report given to RN and Post -op Vital signs reviewed and stable  Post vital signs: Reviewed and stable  Last Vitals:  Vitals Value Taken Time  BP    Temp    Pulse    Resp 11 12/21/2017  2:37 PM  SpO2    Vitals shown include unvalidated device data.  Last Pain:  Vitals:   12/21/17 1130  PainSc: 0-No pain         Complications: No apparent anesthesia complications

## 2017-12-21 NOTE — Anesthesia Post-op Follow-up Note (Signed)
Anesthesia QCDR form completed.        

## 2017-12-21 NOTE — Care Management (Signed)
The extra 200 ml of fluid brought her pressures to an acceptable point.

## 2017-12-22 ENCOUNTER — Encounter: Payer: Self-pay | Admitting: Vascular Surgery

## 2018-01-30 IMAGING — CT CT RENAL STONE PROTOCOL
3 of 4 series · 9 of 46 positions shown, 16 images · non-contrast
Comparison: None.

CLINICAL DATA: Left flank pain.

EXAM:
CT ABDOMEN AND PELVIS WITHOUT CONTRAST
TECHNIQUE: Multidetector CT imaging of the abdomen and pelvis was performed
following the standard protocol without IV contrast.

[Series 4: lung · axial · 0.97mm/px · z∈[-158,-72]mm · 5 of 27 slices shown, 10 images]
[im 5/27  soft-tissue]
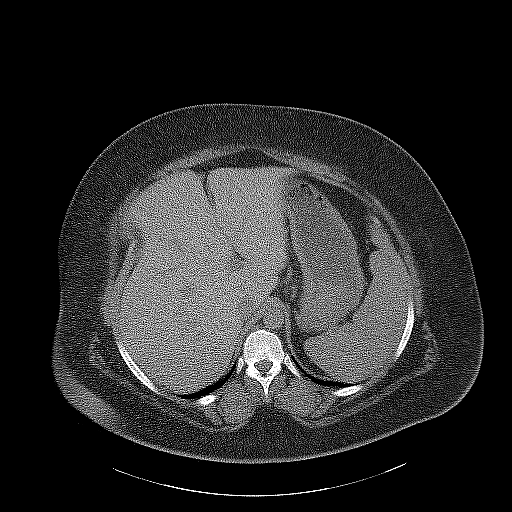
[im 5/27  bone]
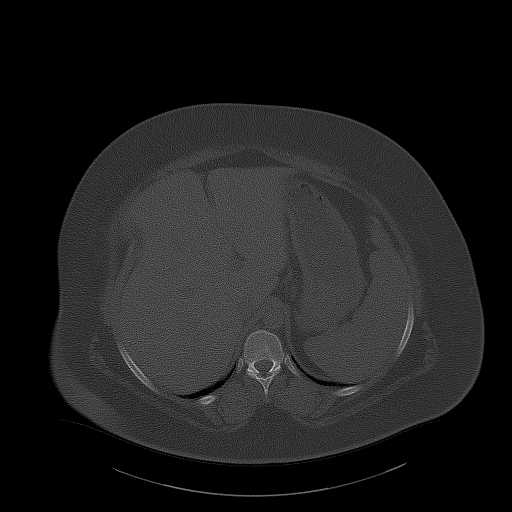
[im 9/27  soft-tissue]
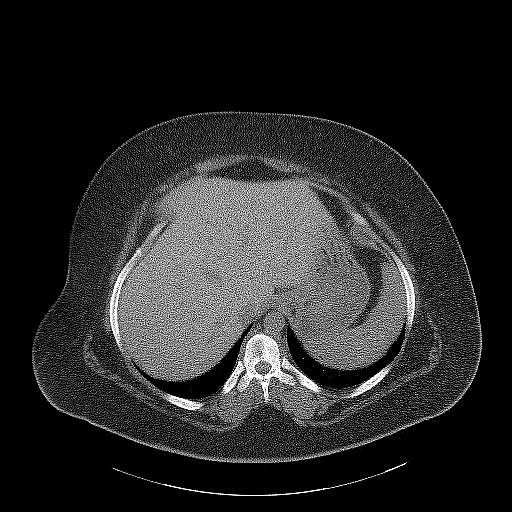
[im 9/27  lung]
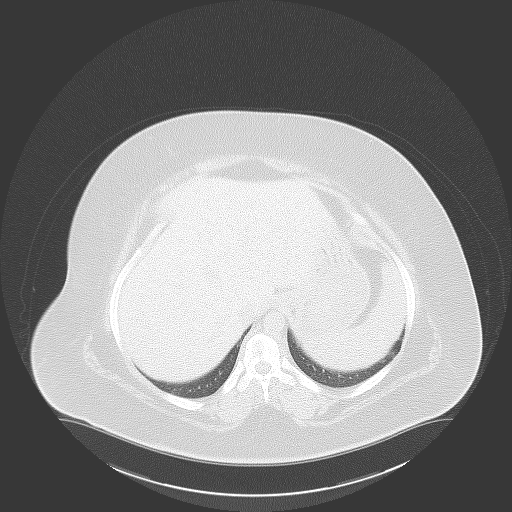
[im 14/27  soft-tissue]
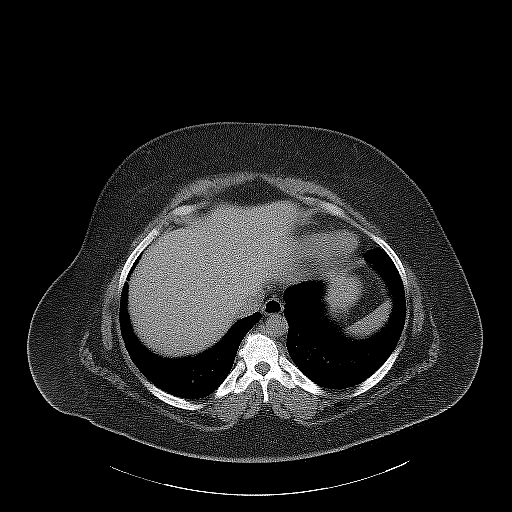
[im 14/27  lung]
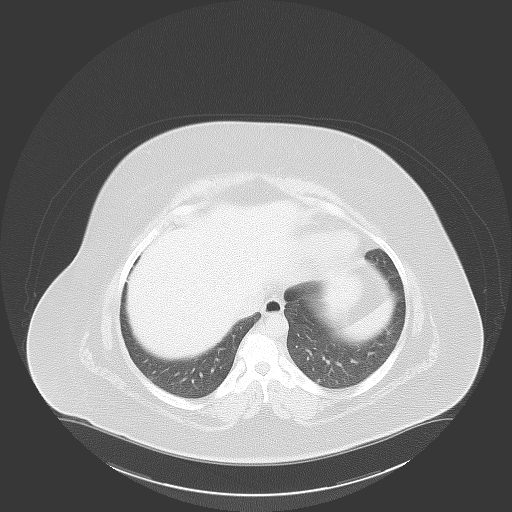
[im 18/27  soft-tissue]
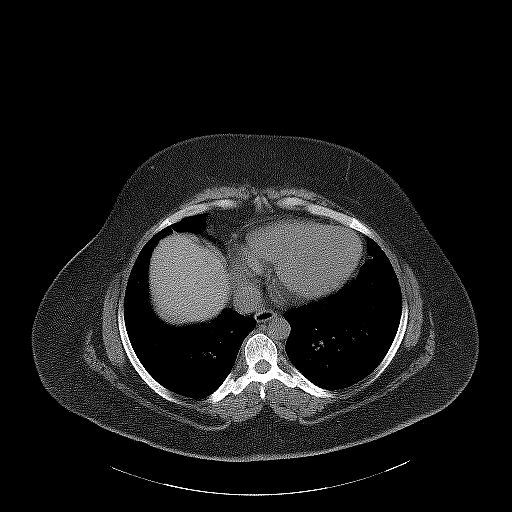
[im 18/27  lung]
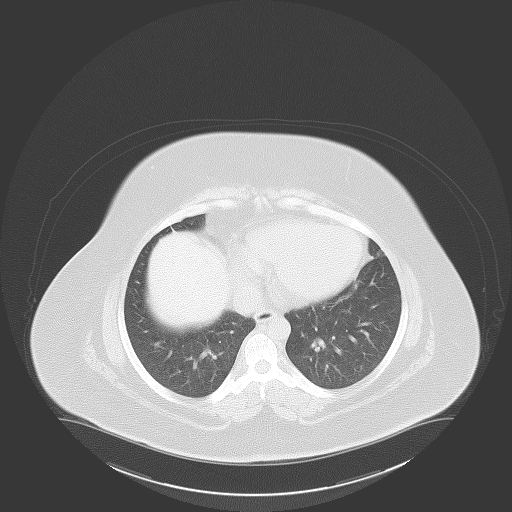
[im 22/27  soft-tissue]
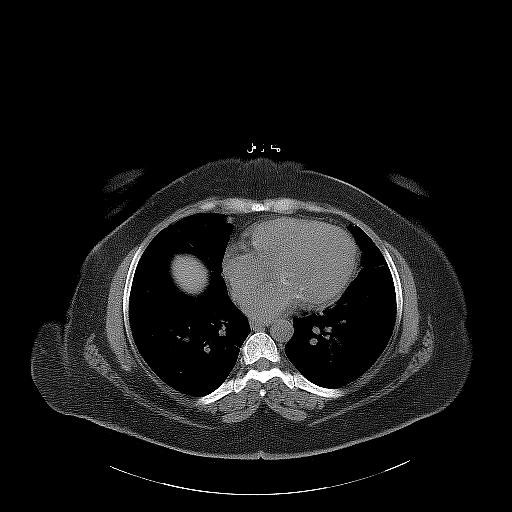
[im 22/27  lung]
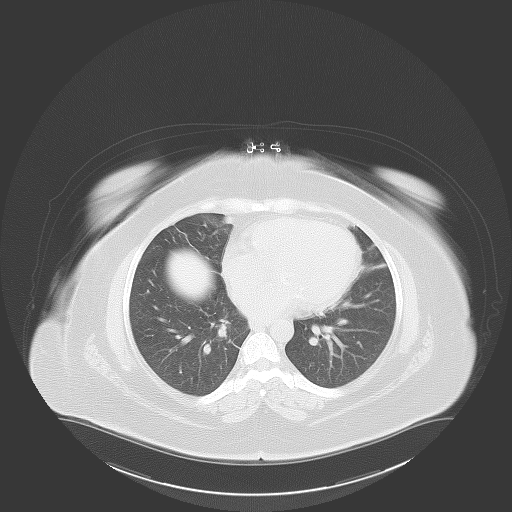

[Series 5: coronal · coronal · 0.98mm/px · 3 of 179 slices shown, 4 images]
[im 60/179  soft-tissue]
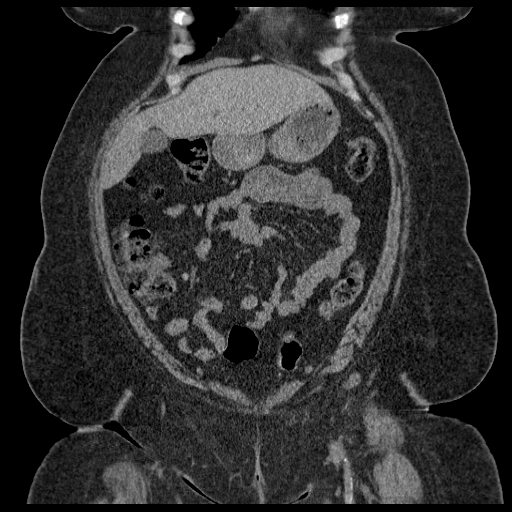
[im 80/179  soft-tissue]
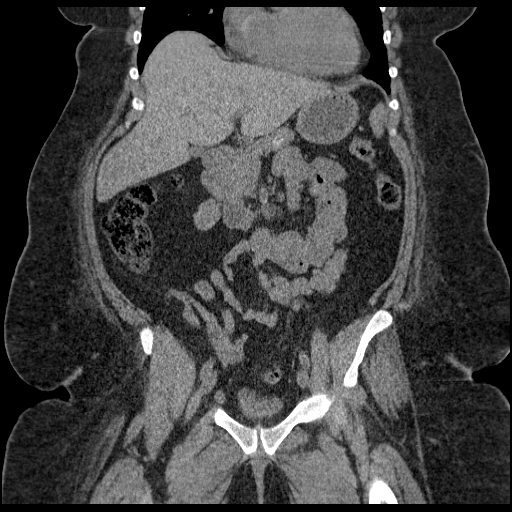
[im 80/179  bone]
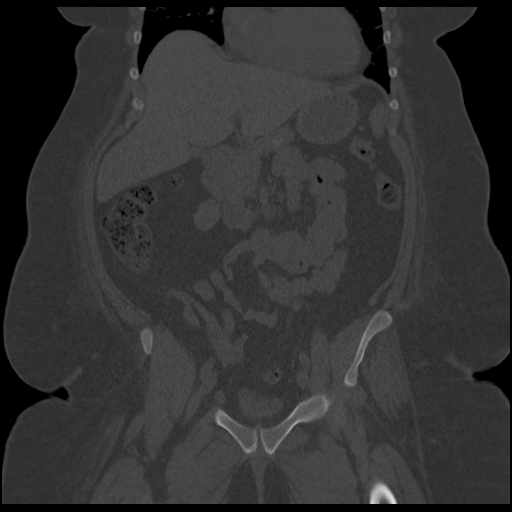
[im 99/179  soft-tissue]
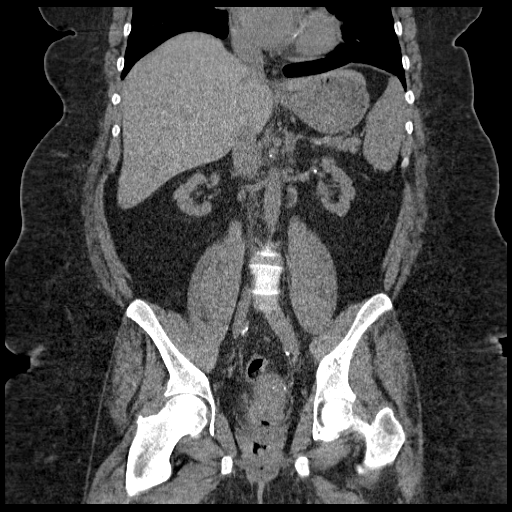

[Series 6: sagittal · sagittal · 0.77mm/px · 1 of 232 slices shown, 2 images]
[im 78/232  soft-tissue]
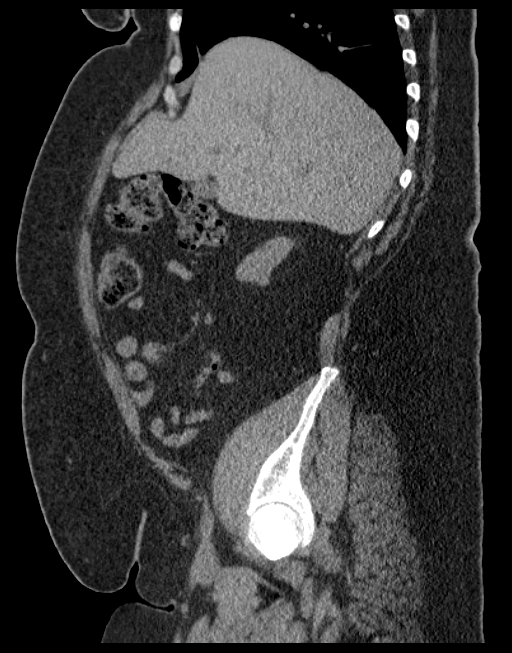
[im 78/232  bone]
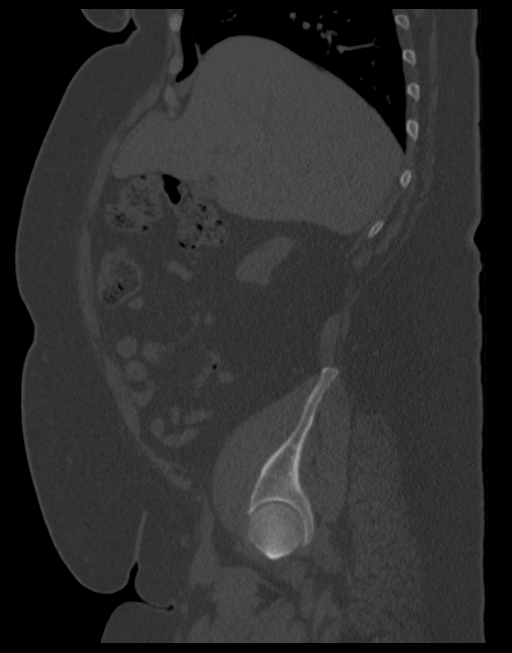

[9 of 46 positions shown; findings below may reference images not displayed]

FINDINGS: Lower chest: Linear atelectasis in the lingula and right middle
lobe. There are mitral annulus calcifications

Liver: Prominent in size. No evidence of focal lesion allowing for
lack contrast.

Hepatobiliary: Gallbladder physiologically distended, no calcified
stone. No biliary dilatation.

Pancreas: No ductal dilatation or inflammation.

Spleen: Upper normal in size.  No focal abnormality.

Adrenal glands: No nodule.

Kidneys: Shrunken atrophic kidneys consistent chronic renal disease.
No hydronephrosis. No urolithiasis. 1.5 cm partially exophytic
lesion from the lower right kidney measures simple fluid density,
most consistent with cyst.

Stomach/Bowel: Stomach distended with ingested contents. There are
no dilated or thickened small bowel loops. Moderate volume of stool
throughout the colon without colonic wall thickening. A few cecal
diverticula are seen. The appendix is normal.

Vascular/Lymphatic: No retroperitoneal adenopathy. Abdominal aorta
is normal in caliber. Age advanced atherosclerosis.

Reproductive: Intrauterine device appropriately positioned in the
uterus. No adnexal mass.

Bladder: Decompressed.

Other: No free air, free fluid, or intra-abdominal fluid collection.

Musculoskeletal: There are no acute or suspicious osseous
abnormalities. Lobular hypodensity a spanning T9 and T10
intervertebral disc space with well-defined borders and no
suspicious characteristics.
IMPRESSION: 1. No renal stones or obstructive uropathy. Atrophic kidneys
consistent chronic renal disease.
2. No acute abnormality in the abdomen/pelvis.

## 2018-02-15 ENCOUNTER — Other Ambulatory Visit (INDEPENDENT_AMBULATORY_CARE_PROVIDER_SITE_OTHER): Payer: Self-pay | Admitting: Vascular Surgery

## 2018-02-15 DIAGNOSIS — Z9889 Other specified postprocedural states: Principal | ICD-10-CM

## 2018-02-15 DIAGNOSIS — Z8679 Personal history of other diseases of the circulatory system: Secondary | ICD-10-CM

## 2018-02-16 ENCOUNTER — Encounter (INDEPENDENT_AMBULATORY_CARE_PROVIDER_SITE_OTHER): Payer: Self-pay | Admitting: Nurse Practitioner

## 2018-02-16 ENCOUNTER — Ambulatory Visit (INDEPENDENT_AMBULATORY_CARE_PROVIDER_SITE_OTHER): Payer: Medicare Other | Admitting: Nurse Practitioner

## 2018-02-16 ENCOUNTER — Ambulatory Visit (INDEPENDENT_AMBULATORY_CARE_PROVIDER_SITE_OTHER): Payer: Medicare Other

## 2018-02-16 VITALS — BP 121/88 | HR 94 | Resp 14 | Ht 64.0 in | Wt 243.6 lb

## 2018-02-16 DIAGNOSIS — Z8679 Personal history of other diseases of the circulatory system: Secondary | ICD-10-CM | POA: Diagnosis not present

## 2018-02-16 DIAGNOSIS — Z992 Dependence on renal dialysis: Secondary | ICD-10-CM

## 2018-02-16 DIAGNOSIS — N186 End stage renal disease: Secondary | ICD-10-CM

## 2018-02-16 DIAGNOSIS — E1122 Type 2 diabetes mellitus with diabetic chronic kidney disease: Secondary | ICD-10-CM

## 2018-02-16 DIAGNOSIS — Z9889 Other specified postprocedural states: Secondary | ICD-10-CM

## 2018-02-16 DIAGNOSIS — F17210 Nicotine dependence, cigarettes, uncomplicated: Secondary | ICD-10-CM

## 2018-02-16 DIAGNOSIS — I120 Hypertensive chronic kidney disease with stage 5 chronic kidney disease or end stage renal disease: Secondary | ICD-10-CM

## 2018-02-16 DIAGNOSIS — I10 Essential (primary) hypertension: Secondary | ICD-10-CM

## 2018-02-16 NOTE — Progress Notes (Signed)
Subjective:    Patient ID: [name], female    DOB: Feb 15, 1985, 33 y.o.   MRN: [id number] Chief Complaint  Patient presents with  . Follow-up    HPI  [name] is a 33 y.o. female that presents today after being referred from dialysis center for pulling clots.  She states that they frequently have issues with accessing her due to where the access site is.  She states that in some instances there is no issues with access while others becomes great difficulty.  She denies any fever, chills, nausea, vomiting or diarrhea.  She denies any chest pain or shortness of breath.  Patient currently has a PermCath placed in her right chest.  The patient underwent hemodialysis access duplex today which reveals flow volume of 1243.  The right brachial basilic transposition AV fistula is patent, there are no areas of significant stenosis.  There is a small hematoma seen in the proximal upper arm adjacent to the fistula with no flow within the hematoma.  Past Medical History:  Diagnosis Date  . Anemia   . Asthma   . Diabetes mellitus   . Dysrhythmia   . 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 GERD (gastroesophageal reflux disease)   . Hypertension   . Irregular heart beat   . Morbid obesity ([place])   . Renal insufficiency   . Seizures ([place])   . Sleep apnea     Past Surgical History:  Procedure Laterality Date  . A/V FISTULAGRAM Right 10/09/2017   Procedure: A/V FISTULAGRAM;  Surgeon: Algernon Huxley, MD;  Location: [facility];  Service: Cardiovascular;  Laterality: Right;  . A/V FISTULAGRAM Right 12/21/2017   Procedure: A/V FISTULAGRAM;  Surgeon: Algernon Huxley, MD;  Location: [facility];  Service: Cardiovascular;  Laterality: Right;  . AV FISTULA PLACEMENT  08/26/10   Left Radiocephalic AVF  . AV FISTULA PLACEMENT  08/26/2015   Procedure: ARTERIOVENOUS (AV) FISTULA CREATION;  Surgeon: Algernon Huxley, MD;  Location: [facility];  Service: Vascular;;  . AV  FISTULA PLACEMENT Right 06/28/2017   Procedure: ARTERIOVENOUS (AV) FISTULA CREATION ( BRACHIAL BASILIC );  Surgeon: Algernon Huxley, MD;  Location: [facility];  Service: Vascular;  Laterality: Right;  . BACK SURGERY    . BASCILIC VEIN TRANSPOSITION Right 08/16/2017   Procedure: BASCILIC VEIN TRANSPOSITION (2ND STAGE UPPER);  Surgeon: Algernon Huxley, MD;  Location: [facility];  Service: Vascular;  Laterality: Right;  . CARPAL TUNNEL RELEASE Left 05/26/2017   Procedure: CARPAL TUNNEL RELEASE;  Surgeon: Hessie Knows, MD;  Location: [facility];  Service: Orthopedics;  Laterality: Left;  . CARPAL TUNNEL RELEASE Left 06/09/2017   Procedure: CARPAL TUNNEL RELEASE;  Surgeon: Hessie Knows, MD;  Location: [facility];  Service: Orthopedics;  Laterality: Left;  . DIALYSIS/PERMA CATHETER REMOVAL N/A 03/07/2016   Procedure: Dialysis/Perma Catheter Removal;  Surgeon: Algernon Huxley, MD;  Location: [facility];  Service: Cardiovascular;  Laterality: N/A;  . EYE SURGERY     laser photocoagulation  . EYE SURGERY Right 2008   removal of blood clot  . INTRAUTERINE DEVICE (IUD) INSERTION    . PERIPHERAL VASCULAR CATHETERIZATION N/A 07/07/2014   Procedure: A/V Shuntogram/Fistulagram;  Surgeon: Algernon Huxley, MD;  Location: [facility];  Service: Cardiovascular;  Laterality: N/A;  . PERIPHERAL VASCULAR CATHETERIZATION N/A 07/07/2014   Procedure: A/V Shunt Intervention;  Surgeon: Algernon Huxley, MD;  Location: [facility];  Service: Cardiovascular;  Laterality: N/A;  . PERIPHERAL VASCULAR CATHETERIZATION Right 01/12/2015   Procedure: A/V Shuntogram/Fistulagram;  Surgeon: Algernon Huxley, MD;  Location: [facility];  Service: Cardiovascular;  Laterality: Right;  . PERIPHERAL VASCULAR CATHETERIZATION N/A 01/12/2015   Procedure: A/V Shunt Intervention;  Surgeon: Algernon Huxley, MD;  Location: [facility];  Service: Cardiovascular;  Laterality: N/A;  . PERIPHERAL VASCULAR CATHETERIZATION Right 06/08/2015    Procedure: A/V Shuntogram/Fistulagram;  Surgeon: Algernon Huxley, MD;  Location: [facility];  Service: Cardiovascular;  Laterality: Right;  . PERIPHERAL VASCULAR CATHETERIZATION N/A 06/08/2015   Procedure: A/V Shunt Intervention;  Surgeon: Algernon Huxley, MD;  Location: [facility];  Service: Cardiovascular;  Laterality: N/A;  . PERIPHERAL VASCULAR CATHETERIZATION Right 07/14/2015   Procedure: Thrombectomy;  Surgeon: Katha Cabal, MD;  Location: [facility];  Service: Cardiovascular;  Laterality: Right;  . PERIPHERAL VASCULAR CATHETERIZATION N/A 07/14/2015   Procedure: A/V Shuntogram/Fistulagram;  Surgeon: Katha Cabal, MD;  Location: [facility];  Service: Cardiovascular;  Laterality: N/A;  . PERIPHERAL VASCULAR CATHETERIZATION N/A 07/17/2015   Procedure: Thrombectomy;  Surgeon: Katha Cabal, MD;  Location: [facility];  Service: Cardiovascular;  Laterality: N/A;  . PERIPHERAL VASCULAR CATHETERIZATION N/A 07/17/2015   Procedure: Dialysis/Perma Catheter Insertion;  Surgeon: Katha Cabal, MD;  Location: [facility];  Service: Cardiovascular;  Laterality: N/A;  . PERIPHERAL VASCULAR CATHETERIZATION N/A 08/05/2015   Procedure: Thrombectomy;  Surgeon: Katha Cabal, MD;  Location: [facility];  Service: Cardiovascular;  Laterality: N/A;  declot of graft  . PERIPHERAL VASCULAR THROMBECTOMY N/A 05/10/2017   Procedure: PERIPHERAL VASCULAR THROMBECTOMY;  Surgeon: Algernon Huxley, MD;  Location: [facility];  Service: Cardiovascular;  Laterality: N/A;  . TOE AMPUTATION     both big toes removed    Social History   Socioeconomic History  . Marital status: Single    Spouse name: Not on file  . Number of children: Not on file  . Years of education: Not on file  . Highest education level: Not on file  Occupational History  . Not on file  Social Needs  . Financial resource strain: Not very hard  . Food insecurity:     Worry: Not on file    Inability: Not on file  . Transportation needs:    Medical: No    Non-medical: No  Tobacco Use  . Smoking status: Light Tobacco Smoker    Packs/day: 0.25    Years: 10.00    Pack years: 2.50    Types: Cigarettes  . Smokeless tobacco: Never Used  Substance and Sexual Activity  . Alcohol use: No  . Drug use: No  . Sexual activity: Yes    Birth control/protection: I.U.D.  Lifestyle  . Physical activity:    Days per week: Not on file    Minutes per session: Not on file  . Stress: Not on file  Relationships  . Social connections:    Talks on phone: Three times a week    Gets together: Not on file    Attends religious service: Not on file    Active member of club or organization: Not on file    Attends meetings of clubs or organizations: Not on file    Relationship status: Not on file  . Intimate partner violence:    Fear of current or ex partner: No    Emotionally abused: Not  on file    Physically abused: Not on file    Forced sexual activity: Not on file  Other Topics Concern  . Not on file  Social History Narrative  . Not on file    Family History  Problem Relation Age of Onset  . Diabetes Mother   . Heart attack Mother   . Stroke Mother   . High Cholesterol Mother   . Diabetes Father     Allergies  Allergen Reactions  . Marcillin [Ampicillin] Rash and Other (See Comments)    Breathing and heart palp  Hot and sweating Did it involve swelling of the face/tongue/throat, SOB, or low BP? Yes Did it involve sudden or severe rash/hives, skin peeling, or any reaction on the inside of your mouth or nose? Yes Did you need to seek medical attention at a hospital or doctor's office? No When did it last happen?2 years  If all above answers are "NO", may proceed with cephalosporin use.   [name] Kitchen Penicillins Other (See Comments)    Heart races,sweats    . Latex Swelling and Rash  . Vancomycin Itching, Nausea Only and Rash    Thick pieces of skin  pulls off.     Review of Systems   Review of Systems: Negative Unless Checked Constitutional: [] Weight loss  [] Fever  [] Chills Cardiac: [] Chest pain   []  Atrial Fibrillation  [] Palpitations   [] Shortness of breath when laying flat   [] Shortness of breath with exertion. [] Shortness of breath at rest Vascular:  [] Pain in legs with walking   [] Pain in legs with standing [] Pain in legs when laying flat   [] Claudication    [] Pain in feet when laying flat    [] History of DVT   [] Phlebitis   [] Swelling in legs   [] Varicose veins   [] Non-healing ulcers Pulmonary:   [] Uses home oxygen   [] Productive cough   [] Hemoptysis   [] Wheeze  [] COPD   [] Asthma Neurologic:  [] Dizziness   [] Seizures  [] Blackouts [] History of stroke   [] History of TIA  [] Aphasia   [] Temporary Blindness   [] Weakness or numbness in arm   [] Weakness or numbness in leg Musculoskeletal:   [] Joint swelling   [] Joint pain   [] Low back pain  []  History of Knee Replacement [] Arthritis [] back Surgeries  []  Spinal Stenosis    Hematologic:  [] Easy bruising  [] Easy bleeding   [] Hypercoagulable state   [] Anemic Gastrointestinal:  [] Diarrhea   [] Vomiting  [] Gastroesophageal reflux/heartburn   [] Difficulty swallowing. [] Abdominal pain Genitourinary:  [] Chronic kidney disease   [] Difficult urination  [] Anuric   [] Blood in urine [] Frequent urination  [] Burning with urination   [] Hematuria Skin:  [] Rashes   [] Ulcers [] Wounds Psychological:  [] History of anxiety   []  History of major depression  []  Memory Difficulties     Objective:   Physical Exam  BP 121/88 (BP Location: Left Arm, Patient Position: Sitting)   Pulse 94   Resp 14   Ht 5\' 4"  (1.626 m)   Wt 243 lb 9.6 oz (110.5 kg)   BMI 41.81 kg/m   Gen: WD/WN, NAD Head: [place]/AT, No temporalis wasting.  Ear/Nose/Throat: Hearing grossly intact, nares w/o erythema or drainage Eyes: PER, EOMI, sclera nonicteric.  Neck: Supple, no masses.  No JVD.  Pulmonary:  Good air movement, no use of  accessory muscles.  Cardiac: RRR Vascular:  Vessel Right Left  Radial Palpable Palpable  Brachial Palpable Palpable  Femoral Palpable Palpable  Popliteal Palpable Palpable  Dorsalis Pedis Palpable Palpable  Posterior Tibial  Palpable Palpable   Gastrointestinal: soft, non-distended. No guarding/no peritoneal signs.  Musculoskeletal: M/S 5/5 throughout.  No deformity or atrophy.  Neurologic: Pain and light touch intact in extremities.  Symmetrical.  Speech is fluent. Motor exam as listed above. Psychiatric: Judgment intact, Mood & affect appropriate for pt's clinical situation. Dermatologic: No Venous rashes. No Ulcers Noted.  No changes consistent with cellulitis. Lymph : No Cervical lymphadenopathy, no lichenification or skin changes of chronic lymphedema.      Assessment & Plan:   1. End stage renal disease ([place]) The patient underwent hemodialysis access duplex today which reveals flow volume of 1243.  The right brachial basilic transposition AV fistula is patent, there are no areas of significant stenosis.  There is a small hematoma seen in the proximal upper arm adjacent to the fistula with no flow within the hematoma.  Patient's current dialysis access is patent with no evidence of stenosis with no evidence of thrombosis.  Recommend:  The patient is doing well and currently has adequate dialysis access. Flow pattern is stable when compared to the prior ultrasound.  The patient should have a duplex ultrasound of the dialysis access in 6 months. The patient will follow-up with me in the office after each ultrasound    - VAS [place] (AVF, AVG); Future  2. Type 2 diabetes mellitus with chronic kidney disease on chronic dialysis, unspecified whether long term insulin use ([place]) Continue hypoglycemic medications as already ordered, these medications have been reviewed and there are no changes at this time.  Hgb A1C to be monitored as already arranged by primary  service   3. Essential hypertension, benign Continue antihypertensive medications as already ordered, these medications have been reviewed and there are no changes at this time.    Current Outpatient Medications on File Prior to Visit  Medication Sig Dispense Refill  . albuterol (PROVENTIL HFA;VENTOLIN HFA) 108 (90 Base) MCG/ACT inhaler Inhale 2 puffs into the lungs 2 (two) times daily as needed for wheezing or shortness of breath.     [name] Kitchen aspirin 81 MG tablet Take 81 mg by mouth every evening.     . docusate sodium (COLACE) 100 MG capsule Take 100 mg by mouth daily as needed for mild constipation.     . fentaNYL (DURAGESIC - DOSED MCG/HR) 12 MCG/HR Place 12 mcg onto the skin every 3 (three) days.     [name] Kitchen FOSRENOL 1000 MG PACK Take 1 packet by mouth See admin instructions. Take 1 packet with each meal and with large snacks  6  . insulin glargine (LANTUS) 100 UNIT/ML injection Inject 6 Units into the skin daily as needed (high blood sugar). If blood sugar is over 120 for several days in a row    . LORazepam (ATIVAN) 2 MG/ML injection Inject into the vein.    . midodrine (PROAMATINE) 10 MG tablet Take 10 mg by mouth 2 (two) times daily.    [name] Kitchen omeprazole (PRILOSEC) 20 MG capsule Take 20 mg by mouth daily as needed (acid reflux).     [name] Kitchen oxyCODONE-acetaminophen (ROXICET) 5-325 MG tablet Take 1-2 tablets by mouth every 4 (four) hours as needed. (Patient taking differently: Take 1 tablet by mouth 2 (two) times daily. ) 30 tablet 0  . pravastatin (PRAVACHOL) 40 MG tablet Take 40 mg by mouth at bedtime.     . promethazine (PHENERGAN) 12.5 MG tablet Take 12.5 mg by mouth every 6 (six) hours as needed for nausea or vomiting.    . Skin  Protectants, Misc. (DERMACERIN) CREA Apply 1 application topically daily as needed (itching / dry skin).     . Camphor-Eucalyptus-Menthol (VICKS VAPORUB EX) Apply 1 application topically daily as needed (congestion).    . naloxone (NARCAN) nasal spray 4 mg/0.1 mL Place 1 spray  into the nose daily as needed (opioid overdose).    [name] Kitchen PEDIATRIC MULTIPLE VITAMINS PO Take 1 tablet by mouth every evening. Flintstones     No current facility-administered medications on file prior to visit.     There are no Patient Instructions on file for this visit. Return in about 6 months (around 08/17/2018).   Kris Hartmann, NP  This note was completed with Sales executive.  Any errors are purely unintentional.

## 2018-02-19 ENCOUNTER — Encounter: Payer: Self-pay | Admitting: *Deleted

## 2018-02-19 ENCOUNTER — Other Ambulatory Visit: Payer: Self-pay

## 2018-02-19 ENCOUNTER — Other Ambulatory Visit: Payer: Self-pay | Admitting: Podiatry

## 2018-02-19 ENCOUNTER — Encounter
Admission: RE | Admit: 2018-02-19 | Discharge: 2018-02-19 | Disposition: A | Discharge status: Home / Self Care | Payer: Medicare Other | Source: Ambulatory Visit | Attending: Podiatry | Admitting: Podiatry

## 2018-02-19 DIAGNOSIS — Z01812 Encounter for preprocedural laboratory examination: Secondary | ICD-10-CM | POA: Insufficient documentation

## 2018-02-19 DIAGNOSIS — Z5309 Procedure and treatment not carried out because of other contraindication: Secondary | ICD-10-CM | POA: Diagnosis not present

## 2018-02-19 DIAGNOSIS — L97512 Non-pressure chronic ulcer of other part of right foot with fat layer exposed: Secondary | ICD-10-CM | POA: Diagnosis not present

## 2018-02-19 HISTORY — DX: Personal history of Methicillin resistant Staphylococcus aureus infection: Z86.14

## 2018-02-19 HISTORY — DX: Anxiety disorder, unspecified: F41.9

## 2018-02-19 LAB — CBC
HCT: 37.6 % (ref 36.0–46.0)
Hemoglobin: 12 g/dL (ref 12.0–15.0)
MCH: 29.9 pg (ref 26.0–34.0)
MCHC: 31.9 g/dL (ref 30.0–36.0)
MCV: 93.5 fL (ref 80.0–100.0)
Platelets: 235 10*3/uL (ref 150–400)
RBC: 4.02 MIL/uL (ref 3.87–5.11)
RDW: 17 % — ABNORMAL HIGH (ref 11.5–15.5)
WBC: 7.4 10*3/uL (ref 4.0–10.5)
nRBC: 0 % (ref 0.0–0.2)

## 2018-02-19 LAB — BASIC METABOLIC PANEL
Anion gap: 13 (ref 5–15)
BUN: 55 mg/dL — ABNORMAL HIGH (ref 6–20)
CO2: 28 mmol/L (ref 22–32)
Calcium: 8.3 mg/dL — ABNORMAL LOW (ref 8.9–10.3)
Chloride: 96 mmol/L — ABNORMAL LOW (ref 98–111)
Creatinine, Ser: 10.67 mg/dL — ABNORMAL HIGH (ref 0.44–1.00)
GFR calc Af Amer: 5 mL/min — ABNORMAL LOW (ref >60)
GFR calc non Af Amer: 4 mL/min — ABNORMAL LOW (ref >60)
GLUCOSE: 165 mg/dL — AB (ref 70–99)
Potassium: 5.1 mmol/L (ref 3.5–5.1)
Sodium: 137 mmol/L (ref 135–145)

## 2018-02-19 LAB — PROTIME-INR
INR: 1.07
Prothrombin Time: 13.8 seconds (ref 11.4–15.2)

## 2018-02-19 LAB — APTT: aPTT: 30 seconds (ref 24–36)

## 2018-02-19 MED ORDER — ONDANSETRON HCL 4 MG/2ML IJ SOLN: 4.0000 mg | Freq: Once | INTRAMUSCULAR | Status: DC | PRN

## 2018-02-19 MED ORDER — FENTANYL CITRATE (PF) 100 MCG/2ML IJ SOLN: 25.0000 ug | INTRAMUSCULAR | Status: DC | PRN

## 2018-02-19 NOTE — Pre-Procedure Instructions (Signed)
Copy and pasted from Care everywhere:  EKG Reconciliation9/12/2017 [facility] Result Narrative  Ventricular Rate          90    BPM          Atrial Rate            90    BPM          P-R Interval            132    ms          QRS Duration            66    ms          Q-T Interval            394    ms          QTC                481    ms          P Axis               60    degrees        R Axis               29    degrees        T Axis               60    degrees         Sinus rhythm  Low voltage QRS, consider pulmonary disease or obesity  Prolonged QT interval, consider electrolyte imbalance or drug effects  When compared with ECG of 08-Feb-2016 14:37,  No significant change was found  Confirmed by fellow Aladin, Amer ([id number]) on 10/11/2017 11:58:04 AM Confirmed by HAISTY, DR. W. K. ([id number]) on 10/11/2017 11:59:03 AM   Other Result Information  Interface, External Ris In - 10/11/2017 11:59 AM EDT Ventricular Rate                   90        BPM                  Atrial Rate                        90        BPM                  P-R Interval                       132       ms                   QRS Duration                       66        ms                   Q-T Interval                       394       ms                   QTC  481       ms                   P Axis                             60        degrees              R Axis                             29        degrees              T Axis                             60        degrees               Sinus rhythm  Low voltage QRS, consider pulmonary disease or obesity  Prolonged QT interval, consider electrolyte imbalance or drug effects  When compared with ECG of  08-Feb-2016 14:37,  No significant change was found  Confirmed by fellow Aladin, Amer ([id number]) on 10/11/2017 11:58:04 AM Confirmed by HAISTY, DR. W. K. ([id number]) on 10/11/2017 11:59:03 AM

## 2018-02-19 NOTE — Patient Instructions (Signed)
Your procedure is scheduled on: Thursday, February 22, 2018 Report to Day Surgery on the 2nd floor of the [facility]. To find out your arrival time, please call [phone] between 1PM - 3PM on: Wednesday, February 21, 2018  REMEMBER: Instructions that are not followed completely may result in serious medical risk, up to and including death; or upon the discretion of your surgeon and anesthesiologist your surgery may need to be rescheduled.  Do not eat food after midnight the night before surgery.  No gum chewing, lozengers or hard candies.  You may however, drink water up to 2 hours before you are scheduled to arrive for your surgery. Do not drink anything within 2 hours of the start of your surgery.  No Alcohol for 24 hours before or after surgery.  No Smoking including e-cigarettes for 24 hours prior to surgery.  No chewable tobacco products for at least 6 hours prior to surgery.  No nicotine patches on the day of surgery.  On the morning of surgery brush your teeth with toothpaste and water, you may rinse your mouth with mouthwash if you wish. Do not swallow any toothpaste or mouthwash.  Notify your doctor if there is any change in your medical condition (cold, fever, infection).  Do not wear jewelry, make-up, hairpins, clips or nail polish.  Do not wear lotions, powders, or perfumes.   Do not shave 48 hours prior to surgery.   Contacts and dentures may not be worn into surgery.  Do not bring valuables to the hospital, including drivers license, insurance or credit cards.  [facility] is not responsible for any belongings or valuables.   TAKE THESE MEDICATIONS THE MORNING OF SURGERY:  1.  Albuterol inhaler 2.  Omeprazole - (take one the night before and one on the morning of surgery - helps to prevent nausea after surgery.) 3.  Oxycodone (if needed for pain)  Use CHG Soap or wipes as directed on instruction sheet.  Use inhalers on the day of surgery and bring to the  hospital.  Do not take any insulin on the morning of surgery.  NOW!  Stop Anti-inflammatories (NSAIDS) such as Advil, Aleve, Ibuprofen, Motrin, Naproxen, Naprosyn and Aspirin based products such as Excedrin, Goodys Powder, BC Powder. (May take Tylenol or Acetaminophen if needed.)  NOW!  Stop ANY OVER THE COUNTER supplements until after surgery.  Wear comfortable clothing (specific to your surgery type) to the hospital.  If you are being discharged the day of surgery, you will not be allowed to drive home. You will need a responsible adult to drive you home and stay with you that night.   If you are taking public transportation, you will need to have a responsible adult with you. Please confirm with your physician that it is acceptable to use public transportation.   Please call [phone] if you have any questions about these instructions.

## 2018-02-20 ENCOUNTER — Other Ambulatory Visit: Payer: Self-pay | Admitting: Podiatry

## 2018-02-21 ENCOUNTER — Encounter: Payer: Self-pay | Admitting: Anesthesiology

## 2018-02-21 DIAGNOSIS — E11618 Type 2 diabetes mellitus with other diabetic arthropathy: Secondary | ICD-10-CM | POA: Insufficient documentation

## 2018-02-21 MED ORDER — CLINDAMYCIN PHOSPHATE 900 MG/50ML IV SOLN: 900.0000 mg | INTRAVENOUS | Status: DC

## 2018-02-22 ENCOUNTER — Ambulatory Visit
Admission: RE | Admit: 2018-02-22 | Discharge: 2018-02-22 | Disposition: A | Discharge status: Home / Self Care | Payer: Medicare Other | Attending: Podiatry | Admitting: Podiatry

## 2018-02-22 ENCOUNTER — Encounter
Admission: RE | Disposition: A | Discharge status: Home / Self Care | Payer: Self-pay | Source: Home / Self Care | Attending: Podiatry

## 2018-02-22 DIAGNOSIS — Z5309 Procedure and treatment not carried out because of other contraindication: Secondary | ICD-10-CM | POA: Insufficient documentation

## 2018-02-22 DIAGNOSIS — L97512 Non-pressure chronic ulcer of other part of right foot with fat layer exposed: Secondary | ICD-10-CM | POA: Diagnosis not present

## 2018-02-22 LAB — POCT I-STAT 4, (NA,K, GLUC, HGB,HCT)
Glucose, Bld: 98 mg/dL (ref 70–99)
HCT: 38 % (ref 36.0–46.0)
Hemoglobin: 12.9 g/dL (ref 12.0–15.0)
POTASSIUM: 5.8 mmol/L — AB (ref 3.5–5.1)
Sodium: 136 mmol/L (ref 135–145)

## 2018-02-22 LAB — GLUCOSE, CAPILLARY: Glucose-Capillary: 99 mg/dL (ref 70–99)

## 2018-02-22 SURGERY — EXCISION, METATARSAL BONE, HEAD
Anesthesia: Choice | Laterality: Right

## 2018-02-22 MED ORDER — SODIUM CHLORIDE 0.9 % IV SOLN: INTRAVENOUS | Status: DC

## 2018-02-22 MED ORDER — FENTANYL CITRATE (PF) 100 MCG/2ML IJ SOLN
INTRAMUSCULAR | Status: AC
  Filled 2018-02-22: qty 2

## 2018-02-22 MED ORDER — MIDAZOLAM HCL 2 MG/2ML IJ SOLN
INTRAMUSCULAR | Status: AC
  Filled 2018-02-22: qty 2

## 2018-02-22 MED ORDER — POVIDONE-IODINE 7.5 % EX SOLN
Freq: Once | CUTANEOUS | Status: DC
  Filled 2018-02-22: qty 118

## 2018-02-22 SURGICAL SUPPLY — 33 items
BAG COUNTER SPONGE EZ (MISCELLANEOUS) ×2 IMPLANT
BNDG CONFORM 2 STRL LF (GAUZE/BANDAGES/DRESSINGS) ×3 IMPLANT
BNDG CONFORM 3 STRL LF (GAUZE/BANDAGES/DRESSINGS) ×3 IMPLANT
BNDG ESMARK 4X12 TAN STRL LF (GAUZE/BANDAGES/DRESSINGS) ×3 IMPLANT
BNDG GAUZE 4.5X4.1 6PLY STRL (MISCELLANEOUS) ×3 IMPLANT
CLOSURE WOUND 1/4X4 (GAUZE/BANDAGES/DRESSINGS) ×1
COUNTER SPONGE BAG EZ (MISCELLANEOUS) ×1
COVER WAND RF STERILE (DRAPES) ×3 IMPLANT
CUFF TOURN 18 STER (MISCELLANEOUS) ×3 IMPLANT
CUFF TOURN DUAL PL 12 NO SLV (MISCELLANEOUS) ×3 IMPLANT
DRAPE FLUOR MINI C-ARM 54X84 (DRAPES) ×3 IMPLANT
DRSG TEGADERM 4X4.75 (GAUZE/BANDAGES/DRESSINGS) ×3 IMPLANT
DURAPREP 26ML APPLICATOR (WOUND CARE) ×3 IMPLANT
ELECT REM PT RETURN 9FT ADLT (ELECTROSURGICAL) ×3
ELECTRODE REM PT RTRN 9FT ADLT (ELECTROSURGICAL) ×1 IMPLANT
GAUZE PETRO XEROFOAM 1X8 (MISCELLANEOUS) ×3 IMPLANT
GAUZE SPONGE 4X4 12PLY STRL (GAUZE/BANDAGES/DRESSINGS) ×3 IMPLANT
GLOVE BIO SURGEON STRL SZ8 (GLOVE) ×3 IMPLANT
GLOVE INDICATOR 8.0 STRL GRN (GLOVE) ×3 IMPLANT
GOWN STRL REUS W/ TWL LRG LVL3 (GOWN DISPOSABLE) ×1 IMPLANT
GOWN STRL REUS W/TWL LRG LVL3 (GOWN DISPOSABLE) ×2
KIT TURNOVER KIT A (KITS) ×3 IMPLANT
LABEL OR SOLS (LABEL) ×3 IMPLANT
NDL SAFETY ECLIPSE 18X1.5 (NEEDLE) ×1 IMPLANT
NEEDLE HYPO 18GX1.5 SHARP (NEEDLE) ×2
NEEDLE HYPO 25X1 1.5 SAFETY (NEEDLE) ×6 IMPLANT
NS IRRIG 500ML POUR BTL (IV SOLUTION) ×3 IMPLANT
PACK EXTREMITY ARMC (MISCELLANEOUS) ×3 IMPLANT
PENCIL ELECTRO HAND CTR (MISCELLANEOUS) ×3 IMPLANT
STOCKINETTE M/LG 89821 (MISCELLANEOUS) ×3 IMPLANT
STRIP CLOSURE SKIN 1/4X4 (GAUZE/BANDAGES/DRESSINGS) ×2 IMPLANT
SUT VIC AB 4-0 FS2 27 (SUTURE) ×3 IMPLANT
SUT VICRYL AB 3-0 FS1 BRD 27IN (SUTURE) ×3 IMPLANT

## 2018-02-22 NOTE — Progress Notes (Signed)
Serum K 5.8 on DOS.  Per patient, did not go to dialysis today.  Per Dr. Marcello Moores (anesthesiology), case cancelled.  Pt verbalized understanding to call Dr. Selina Cooley office to reschedule.

## 2018-02-28 ENCOUNTER — Other Ambulatory Visit: Payer: Self-pay | Admitting: Podiatry

## 2018-03-16 ENCOUNTER — Ambulatory Visit: Payer: Medicare Other | Admitting: Anesthesiology

## 2018-03-16 ENCOUNTER — Ambulatory Visit
Admission: RE | Admit: 2018-03-16 | Discharge: 2018-03-16 | Disposition: A | Discharge status: Home / Self Care | Payer: Medicare Other | Attending: Podiatry | Admitting: Podiatry

## 2018-03-16 ENCOUNTER — Encounter
Admission: RE | Disposition: A | Discharge status: Home / Self Care | Payer: Self-pay | Source: Home / Self Care | Attending: Podiatry

## 2018-03-16 ENCOUNTER — Other Ambulatory Visit: Payer: Self-pay

## 2018-03-16 DIAGNOSIS — Z880 Allergy status to penicillin: Secondary | ICD-10-CM | POA: Insufficient documentation

## 2018-03-16 DIAGNOSIS — Z91040 Latex allergy status: Secondary | ICD-10-CM | POA: Diagnosis not present

## 2018-03-16 DIAGNOSIS — F17200 Nicotine dependence, unspecified, uncomplicated: Secondary | ICD-10-CM | POA: Insufficient documentation

## 2018-03-16 DIAGNOSIS — Z881 Allergy status to other antibiotic agents status: Secondary | ICD-10-CM | POA: Diagnosis not present

## 2018-03-16 DIAGNOSIS — F419 Anxiety disorder, unspecified: Secondary | ICD-10-CM | POA: Diagnosis not present

## 2018-03-16 DIAGNOSIS — Z79899 Other long term (current) drug therapy: Secondary | ICD-10-CM | POA: Diagnosis not present

## 2018-03-16 DIAGNOSIS — I10 Essential (primary) hypertension: Secondary | ICD-10-CM | POA: Insufficient documentation

## 2018-03-16 DIAGNOSIS — J45909 Unspecified asthma, uncomplicated: Secondary | ICD-10-CM | POA: Diagnosis not present

## 2018-03-16 DIAGNOSIS — X58XXXA Exposure to other specified factors, initial encounter: Secondary | ICD-10-CM | POA: Diagnosis not present

## 2018-03-16 DIAGNOSIS — Z992 Dependence on renal dialysis: Secondary | ICD-10-CM | POA: Insufficient documentation

## 2018-03-16 DIAGNOSIS — M869 Osteomyelitis, unspecified: Secondary | ICD-10-CM | POA: Insufficient documentation

## 2018-03-16 DIAGNOSIS — D631 Anemia in chronic kidney disease: Secondary | ICD-10-CM | POA: Insufficient documentation

## 2018-03-16 DIAGNOSIS — E1169 Type 2 diabetes mellitus with other specified complication: Secondary | ICD-10-CM | POA: Diagnosis not present

## 2018-03-16 DIAGNOSIS — K219 Gastro-esophageal reflux disease without esophagitis: Secondary | ICD-10-CM | POA: Insufficient documentation

## 2018-03-16 DIAGNOSIS — G4730 Sleep apnea, unspecified: Secondary | ICD-10-CM | POA: Insufficient documentation

## 2018-03-16 DIAGNOSIS — I120 Hypertensive chronic kidney disease with stage 5 chronic kidney disease or end stage renal disease: Secondary | ICD-10-CM | POA: Insufficient documentation

## 2018-03-16 DIAGNOSIS — E11621 Type 2 diabetes mellitus with foot ulcer: Secondary | ICD-10-CM | POA: Insufficient documentation

## 2018-03-16 DIAGNOSIS — N186 End stage renal disease: Secondary | ICD-10-CM | POA: Insufficient documentation

## 2018-03-16 DIAGNOSIS — E1122 Type 2 diabetes mellitus with diabetic chronic kidney disease: Secondary | ICD-10-CM | POA: Diagnosis not present

## 2018-03-16 DIAGNOSIS — R000 Tachycardia, unspecified: Secondary | ICD-10-CM | POA: Insufficient documentation

## 2018-03-16 DIAGNOSIS — L97512 Non-pressure chronic ulcer of other part of right foot with fat layer exposed: Secondary | ICD-10-CM | POA: Diagnosis not present

## 2018-03-16 DIAGNOSIS — S93124A Dislocation of metatarsophalangeal joint of right lesser toe(s), initial encounter: Secondary | ICD-10-CM | POA: Diagnosis not present

## 2018-03-16 HISTORY — PX: METATARSAL HEAD EXCISION: SHX5027

## 2018-03-16 LAB — POCT I-STAT 4, (NA,K, GLUC, HGB,HCT)
Glucose, Bld: 112 mg/dL — ABNORMAL HIGH (ref 70–99)
HCT: 39 % (ref 36.0–46.0)
HEMOGLOBIN: 13.3 g/dL (ref 12.0–15.0)
Potassium: 4.8 mmol/L (ref 3.5–5.1)
Sodium: 135 mmol/L (ref 135–145)

## 2018-03-16 LAB — HCG, QUANTITATIVE, PREGNANCY: HCG, BETA CHAIN, QUANT, S: 8 m[IU]/mL — AB (ref <5)

## 2018-03-16 LAB — GLUCOSE, CAPILLARY: GLUCOSE-CAPILLARY: 81 mg/dL (ref 70–99)

## 2018-03-16 SURGERY — EXCISION, METATARSAL BONE, HEAD
Anesthesia: General | Site: Foot | Laterality: Right

## 2018-03-16 MED ORDER — ONDANSETRON HCL 4 MG/2ML IJ SOLN: 4.0000 mg | Freq: Once | INTRAMUSCULAR | Status: DC | PRN

## 2018-03-16 MED ORDER — ROCURONIUM BROMIDE 100 MG/10ML IV SOLN
INTRAVENOUS | Status: DC | PRN
  Administered 2018-03-16: 40 mg via INTRAVENOUS

## 2018-03-16 MED ORDER — CLINDAMYCIN PHOSPHATE 900 MG/50ML IV SOLN
INTRAVENOUS | Status: AC
  Filled 2018-03-16: qty 50

## 2018-03-16 MED ORDER — SUGAMMADEX SODIUM 200 MG/2ML IV SOLN
INTRAVENOUS | Status: DC | PRN
  Administered 2018-03-16: 200 mg via INTRAVENOUS

## 2018-03-16 MED ORDER — PROPOFOL 10 MG/ML IV BOLUS
INTRAVENOUS | Status: DC | PRN
  Administered 2018-03-16: 180 mg via INTRAVENOUS

## 2018-03-16 MED ORDER — ROCURONIUM BROMIDE 50 MG/5ML IV SOLN
INTRAVENOUS | Status: AC
  Filled 2018-03-16: qty 1

## 2018-03-16 MED ORDER — FENTANYL CITRATE (PF) 100 MCG/2ML IJ SOLN: 25.0000 ug | INTRAMUSCULAR | Status: DC | PRN

## 2018-03-16 MED ORDER — CLINDAMYCIN PHOSPHATE 900 MG/50ML IV SOLN
900.0000 mg | INTRAVENOUS | Status: AC
  Administered 2018-03-16: 900 mg via INTRAVENOUS

## 2018-03-16 MED ORDER — LIDOCAINE HCL (CARDIAC) PF 100 MG/5ML IV SOSY
PREFILLED_SYRINGE | INTRAVENOUS | Status: DC | PRN
  Administered 2018-03-16: 100 mg via INTRAVENOUS

## 2018-03-16 MED ORDER — FENTANYL CITRATE (PF) 100 MCG/2ML IJ SOLN
INTRAMUSCULAR | Status: DC | PRN
  Administered 2018-03-16: 100 ug via INTRAVENOUS

## 2018-03-16 MED ORDER — HYDROCODONE-ACETAMINOPHEN 5-325 MG PO TABS: 1.0000 | ORAL_TABLET | Freq: Four times a day (QID) | ORAL | 0 refills | Status: DC | PRN

## 2018-03-16 MED ORDER — SODIUM CHLORIDE FLUSH 0.9 % IV SOLN
INTRAVENOUS | Status: AC
  Filled 2018-03-16: qty 10

## 2018-03-16 MED ORDER — BUPIVACAINE HCL 0.5 % IJ SOLN
INTRAMUSCULAR | Status: DC | PRN
  Administered 2018-03-16: 10 mL

## 2018-03-16 MED ORDER — POVIDONE-IODINE 7.5 % EX SOLN
Freq: Once | CUTANEOUS | Status: DC
  Filled 2018-03-16: qty 118

## 2018-03-16 MED ORDER — PHENYLEPHRINE HCL 10 MG/ML IJ SOLN
INTRAMUSCULAR | Status: DC | PRN
  Administered 2018-03-16: 100 ug via INTRAVENOUS

## 2018-03-16 MED ORDER — SUGAMMADEX SODIUM 200 MG/2ML IV SOLN
INTRAVENOUS | Status: AC
  Filled 2018-03-16: qty 2

## 2018-03-16 MED ORDER — SODIUM CHLORIDE 0.9 % IV SOLN
INTRAVENOUS | Status: DC
  Administered 2018-03-16: 11:00:00 via INTRAVENOUS

## 2018-03-16 MED ORDER — ONDANSETRON HCL 4 MG/2ML IJ SOLN
INTRAMUSCULAR | Status: DC | PRN
  Administered 2018-03-16: 4 mg via INTRAVENOUS

## 2018-03-16 MED ORDER — MIDAZOLAM HCL 2 MG/2ML IJ SOLN
INTRAMUSCULAR | Status: DC | PRN
  Administered 2018-03-16: 2 mg via INTRAVENOUS

## 2018-03-16 MED ORDER — MIDAZOLAM HCL 2 MG/2ML IJ SOLN
INTRAMUSCULAR | Status: AC
  Filled 2018-03-16: qty 2

## 2018-03-16 MED ORDER — LIDOCAINE HCL (PF) 2 % IJ SOLN
INTRAMUSCULAR | Status: AC
  Filled 2018-03-16: qty 10

## 2018-03-16 MED ORDER — PROPOFOL 10 MG/ML IV BOLUS
INTRAVENOUS | Status: AC
  Filled 2018-03-16: qty 20

## 2018-03-16 MED ORDER — FENTANYL CITRATE (PF) 100 MCG/2ML IJ SOLN
INTRAMUSCULAR | Status: AC
  Filled 2018-03-16: qty 2

## 2018-03-16 SURGICAL SUPPLY — 34 items
BAG COUNTER SPONGE EZ (MISCELLANEOUS) ×2 IMPLANT
BLADE MED AGGRESSIVE (BLADE) ×3 IMPLANT
BNDG CONFORM 2 STRL LF (GAUZE/BANDAGES/DRESSINGS) ×3 IMPLANT
BNDG CONFORM 3 STRL LF (GAUZE/BANDAGES/DRESSINGS) ×3 IMPLANT
BNDG ESMARK 4X12 TAN STRL LF (GAUZE/BANDAGES/DRESSINGS) ×3 IMPLANT
BNDG GAUZE 4.5X4.1 6PLY STRL (MISCELLANEOUS) ×3 IMPLANT
CLOSURE WOUND 1/4X4 (GAUZE/BANDAGES/DRESSINGS) ×1
COUNTER SPONGE BAG EZ (MISCELLANEOUS) ×1
COVER WAND RF STERILE (DRAPES) ×3 IMPLANT
CUFF TOURN 18 STER (MISCELLANEOUS) ×3 IMPLANT
CUFF TOURN DUAL PL 12 NO SLV (MISCELLANEOUS) ×3 IMPLANT
DRAPE FLUOR MINI C-ARM 54X84 (DRAPES) ×3 IMPLANT
DRSG TEGADERM 4X4.75 (GAUZE/BANDAGES/DRESSINGS) ×3 IMPLANT
DURAPREP 26ML APPLICATOR (WOUND CARE) ×3 IMPLANT
ELECT REM PT RETURN 9FT ADLT (ELECTROSURGICAL) ×3
ELECTRODE REM PT RTRN 9FT ADLT (ELECTROSURGICAL) ×1 IMPLANT
GAUZE PETRO XEROFOAM 1X8 (MISCELLANEOUS) ×3 IMPLANT
GAUZE SPONGE 4X4 12PLY STRL (GAUZE/BANDAGES/DRESSINGS) ×3 IMPLANT
GLOVE BIO SURGEON STRL SZ8 (GLOVE) ×3 IMPLANT
GLOVE INDICATOR 8.0 STRL GRN (GLOVE) ×3 IMPLANT
GOWN STRL REUS W/ TWL LRG LVL3 (GOWN DISPOSABLE) ×1 IMPLANT
GOWN STRL REUS W/TWL LRG LVL3 (GOWN DISPOSABLE) ×2
KIT TURNOVER KIT A (KITS) ×3 IMPLANT
LABEL OR SOLS (LABEL) ×3 IMPLANT
NDL SAFETY ECLIPSE 18X1.5 (NEEDLE) ×1 IMPLANT
NEEDLE HYPO 18GX1.5 SHARP (NEEDLE) ×2
NEEDLE HYPO 25X1 1.5 SAFETY (NEEDLE) ×6 IMPLANT
NS IRRIG 500ML POUR BTL (IV SOLUTION) ×3 IMPLANT
PACK EXTREMITY ARMC (MISCELLANEOUS) ×3 IMPLANT
PENCIL ELECTRO HAND CTR (MISCELLANEOUS) ×3 IMPLANT
STOCKINETTE M/LG 89821 (MISCELLANEOUS) ×3 IMPLANT
STRIP CLOSURE SKIN 1/4X4 (GAUZE/BANDAGES/DRESSINGS) ×2 IMPLANT
SUT VIC AB 4-0 FS2 27 (SUTURE) ×3 IMPLANT
SUT VICRYL AB 3-0 FS1 BRD 27IN (SUTURE) ×3 IMPLANT

## 2018-03-16 NOTE — Anesthesia Postprocedure Evaluation (Signed)
Anesthesia Post Note  Patient: [name]  Procedure(s) Performed: OSTECTOMY MET HEAD 2 (Right Foot)  Patient location during evaluation: PACU Anesthesia Type: General Level of consciousness: awake and alert Pain management: pain level controlled Vital Signs Assessment: post-procedure vital signs reviewed and stable Respiratory status: spontaneous breathing and respiratory function stable Cardiovascular status: stable Anesthetic complications: no     Last Vitals:  Vitals:   03/16/18 1209 03/16/18 1210  BP: 123/83   Pulse: 85   Resp:    Temp: (!) 36.2 C   SpO2:  94%    Last Pain:  Vitals:   03/16/18 1209  TempSrc: Temporal  PainSc: 0-No pain                 Marletta Bousquet K

## 2018-03-16 NOTE — Anesthesia Preprocedure Evaluation (Addendum)
Anesthesia Evaluation  Patient identified by MRN, date of birth, ID band Patient awake    Reviewed: Allergy & Precautions, NPO status , Patient's Chart, lab work & pertinent test results  History of Anesthesia Complications Negative for: history of anesthetic complications  Airway Mallampati: III       Dental   Pulmonary asthma , sleep apnea , Current Smoker,           Cardiovascular hypertension, Pt. on medications (-) Past MI and (-) CHF + dysrhythmias (tachycardia) (-) Valvular Problems/Murmurs     Neuro/Psych Seizures - (no seizures for >10 yrs, no meds),  Anxiety    GI/Hepatic Neg liver ROS, GERD (no meds for years)  Medicated,  Endo/Other  diabetes, Type 2, Oral Hypoglycemic Agents, Insulin Dependent  Renal/GU ESRF and DialysisRenal disease     Musculoskeletal   Abdominal   Peds  Hematology  (+) anemia ,   Anesthesia Other Findings   Reproductive/Obstetrics                            Anesthesia Physical Anesthesia Plan  ASA: IV  Anesthesia Plan: General   Post-op Pain Management:    Induction: Intravenous  PONV Risk Score and Plan: 2 and Dexamethasone and Ondansetron  Airway Management Planned: Oral ETT  Additional Equipment:   Intra-op Plan:   Post-operative Plan:   Informed Consent: I have reviewed the patients History and Physical, chart, labs and discussed the procedure including the risks, benefits and alternatives for the proposed anesthesia with the patient or authorized representative who has indicated his/her understanding and acceptance.       Plan Discussed with:   Anesthesia Plan Comments: (Pt with mildly elevated serum B-HCG. She has consistently had elevated levels. Spoke with Dr. Glennon Mac (OB/Gyn) and he stated that this consistent level is not concerning. Will proceed.)       Anesthesia Quick Evaluation

## 2018-03-16 NOTE — H&P (Signed)
H and P has been reviewed and no changes are noted.  

## 2018-03-16 NOTE — Transfer of Care (Signed)
Immediate Anesthesia Transfer of Care Note  Patient: [name]  Procedure(s) Performed: OSTECTOMY MET HEAD 2 (Right Foot)  Patient Location: PACU  Anesthesia Type:General  Level of Consciousness: awake, oriented, drowsy and patient cooperative  Airway & Oxygen Therapy: Patient Spontanous Breathing and Patient connected to face mask oxygen  Post-op Assessment: Report given to RN, Post -op Vital signs reviewed and stable and Patient moving all extremities  Post vital signs: Reviewed and stable  Last Vitals:  Vitals Value Taken Time  BP 113/87 03/16/2018 11:23 AM  Temp    Pulse 88 03/16/2018 11:30 AM  Resp 17 03/16/2018 11:29 AM  SpO2 91 % 03/16/2018 11:30 AM  Vitals shown include unvalidated device data.  Last Pain:  Vitals:   03/16/18 0909  TempSrc: Tympanic  PainSc: 0-No pain         Complications: No apparent anesthesia complications

## 2018-03-16 NOTE — Op Note (Signed)
Operative note   Surgeon: Dr. Albertine Patricia, DPM.    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    Postop diagnosis: Same    Procedure:   1.  Ostectomy/resection second metatarsal head right foot           EBL: Less than 5 cc    Anesthesia:IV sedation delivered by the anesthesia team.  I injected 10 cc of 0.5% Marcaine plain at the base the operative site preoperatively.    Hemostasis: Ankle tourniquet 225 mils mercury pressure for 14 minutes    Specimen: Degenerative bone and cartilage from the first metatarsal head second metatarsal right foot    Complications: None    Operative indications: Patient has had amputation to the right great toe and right second toe due to osteomyelitis.  She is diabetic.  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  Subsequent fractures to the third and fourth metatarsals led to shortening and elevation of those bones and led to a very very prominent second metatarsal head.  This created enough stress that there was chronic ulceration on the plantar aspect of the foot in accordance with this region.  Conservative treatments failed to keep the wound closed consistently.    Procedure:  Patient was brought into the OR and placed on the operating table in thesupine position. After anesthesia was obtained theright lower extremity was prepped and draped in usual sterile fashion.  Operative Report: This time it is directed over the second metatarsal distal shaft and head.  A 3 cm linear incision was made deepened sharp blunt dissection bleeders clamped bovied described.  Soft tissue was retracted with a wheat Lander retractor and capsule tissue was then incised longitudinally over the distal shaft and head of the bone.  This freed medial laterally.  Had metatarsal identified and was resected at the surgical neck region.  The head was removed in toto.   Degenerative changes were seen in the cartilage at this timeframe.  There is an copiously irrigated and the capsule tissue was then closed with 3-0 Vicryl continuous stitch as were deep superficial fascial layers.  Skin was closed with 3-0 nylon horizontal mattress sutures. A sterile compressive dressing was placed across wound consisting of Xeroform gauze 4 x 4's Kling and Kerlix.    Patient tolerated the procedure and anesthesia well.  Was transported from the OR to the PACU with all vital signs stable and vascular status intact. To be discharged per routine protocol.  Will follow up in approximately 1 week in the outpatient clinic.

## 2018-03-16 NOTE — Anesthesia Post-op Follow-up Note (Signed)
Anesthesia QCDR form completed.        

## 2018-03-16 NOTE — Anesthesia Procedure Notes (Signed)
Procedure Name: Intubation Date/Time: 03/16/2018 10:44 AM Performed by: Bernardo Heater, CRNA Pre-anesthesia Checklist: Patient identified, Emergency Drugs available, Suction available and Patient being monitored Patient Re-evaluated:Patient Re-evaluated prior to induction Oxygen Delivery Method: Circle system utilized Preoxygenation: Pre-oxygenation with 100% oxygen Induction Type: IV induction Laryngoscope Size: Mac and 3 Grade View: Grade I Tube type: Oral Tube size: 7.0 mm Number of attempts: 1 Placement Confirmation: ETT inserted through vocal cords under direct vision,  positive ETCO2 and breath sounds checked- equal and bilateral Secured at: 20 cm Tube secured with: Tape Dental Injury: Teeth and Oropharynx as per pre-operative assessment

## 2018-03-16 NOTE — Discharge Instructions (Signed)
[facility] °[facility] ° °POST OPERATIVE INSTRUCTIONS FOR DR. TROXLER AND DR. FOWLER °[facility] ° ° °1. Take your medication as prescribed.  Pain medication should be taken only as needed. ° °2. Keep the dressing clean, dry and intact. ° °3. Keep your foot elevated above the heart level for the first 48 hours. ° °4. Walking to the bathroom and brief periods of walking are acceptable, unless we have instructed you to be non-weight bearing. ° °5. Always wear your post-op shoe when walking.  Always use your crutches if you are to be non-weight bearing. ° °6. Do not take a shower. Baths are permissible as long as the foot is kept out of the water.  ° °7. Every hour you are awake:  °- Bend your knee 15 times. °- Flex foot 15 times °- Massage calf 15 times ° °8. Call [facility] ([phone]) if any of the following problems occur: °- You develop a temperature or fever. °- The bandage becomes saturated with blood. °- Medication does not stop your pain. °- Injury of the foot occurs. °- Any symptoms of infection including redness, odor, or red streaks running from wound. ° ° ° ° °AMBULATORY SURGERY  °DISCHARGE INSTRUCTIONS ° ° °1) The drugs that you were given will stay in your system until tomorrow so for the next 24 hours you should not: ° °A) Drive an automobile °B) Make any legal decisions °C) Drink any alcoholic beverage ° ° °2) You may resume regular meals tomorrow.  Today it is better to start with liquids and gradually work up to solid foods. ° °You may eat anything you prefer, but it is better to start with liquids, then soup and crackers, and gradually work up to solid foods. ° ° °3) Please notify your doctor immediately if you have any unusual bleeding, trouble breathing, redness and pain at the surgery site, drainage, fever, or pain not relieved by medication. ° ° ° °4) Additional Instructions: ° ° ° ° ° ° ° °Please contact your physician with any  problems or Same Day Surgery at [phone], Monday through Friday 6 am to 4 pm, or [place] [facility] number at [phone]. ° °

## 2018-03-19 LAB — SURGICAL PATHOLOGY

## 2018-03-21 NOTE — Addendum Note (Signed)
Addendum  created 03/21/18 1408 by Doreen Salvage, CRNA   Charge Capture section accepted

## 2018-04-18 ENCOUNTER — Ambulatory Visit (INDEPENDENT_AMBULATORY_CARE_PROVIDER_SITE_OTHER): Payer: Medicare Other | Admitting: Vascular Surgery

## 2018-04-18 ENCOUNTER — Encounter (INDEPENDENT_AMBULATORY_CARE_PROVIDER_SITE_OTHER): Payer: Medicare Other

## 2018-04-23 ENCOUNTER — Other Ambulatory Visit (INDEPENDENT_AMBULATORY_CARE_PROVIDER_SITE_OTHER): Payer: Self-pay | Admitting: Vascular Surgery

## 2018-04-23 DIAGNOSIS — T829XXD Unspecified complication of cardiac and vascular prosthetic device, implant and graft, subsequent encounter: Secondary | ICD-10-CM

## 2018-04-25 ENCOUNTER — Ambulatory Visit (INDEPENDENT_AMBULATORY_CARE_PROVIDER_SITE_OTHER): Payer: Medicare Other | Admitting: Vascular Surgery

## 2018-04-25 ENCOUNTER — Telehealth (INDEPENDENT_AMBULATORY_CARE_PROVIDER_SITE_OTHER): Payer: Self-pay

## 2018-04-25 ENCOUNTER — Encounter: Payer: Self-pay | Admitting: Registered Nurse

## 2018-04-25 ENCOUNTER — Encounter (INDEPENDENT_AMBULATORY_CARE_PROVIDER_SITE_OTHER): Payer: Self-pay | Admitting: Vascular Surgery

## 2018-04-25 ENCOUNTER — Encounter (INDEPENDENT_AMBULATORY_CARE_PROVIDER_SITE_OTHER): Payer: Self-pay

## 2018-04-25 ENCOUNTER — Ambulatory Visit (INDEPENDENT_AMBULATORY_CARE_PROVIDER_SITE_OTHER): Payer: Medicare Other

## 2018-04-25 ENCOUNTER — Other Ambulatory Visit: Payer: Self-pay

## 2018-04-25 VITALS — BP 120/81 | HR 98 | Resp 10 | Ht 64.0 in | Wt 244.0 lb

## 2018-04-25 DIAGNOSIS — Z992 Dependence on renal dialysis: Secondary | ICD-10-CM | POA: Diagnosis not present

## 2018-04-25 DIAGNOSIS — T829XXS Unspecified complication of cardiac and vascular prosthetic device, implant and graft, sequela: Secondary | ICD-10-CM

## 2018-04-25 DIAGNOSIS — T829XXA Unspecified complication of cardiac and vascular prosthetic device, implant and graft, initial encounter: Secondary | ICD-10-CM

## 2018-04-25 DIAGNOSIS — N186 End stage renal disease: Secondary | ICD-10-CM

## 2018-04-25 DIAGNOSIS — T829XXD Unspecified complication of cardiac and vascular prosthetic device, implant and graft, subsequent encounter: Secondary | ICD-10-CM | POA: Diagnosis not present

## 2018-04-25 NOTE — Telephone Encounter (Signed)
Spoke with the patient earlier and had her scheduled for 04/26/2018 for a right  Arm fistulagram with Dr. Lucky Cowboy. Patient stated she could not do that day because she goes to dialysis then. Patient has been rescheduled with anesthesia for 04/30/2018 with a 9:00 am arrival time. A message was left regarding the change and paperwork will be mailed out as well.

## 2018-04-25 NOTE — Progress Notes (Signed)
Subjective:    Patient ID: [name], female    DOB: 05-05-1985, 33 y.o.   MRN: [id number] Chief Complaint  Patient presents with  . Follow-up    Patient presents at the request of her dialysis center due to a poorly functioning left upper extremity fistula.  The patient notes that dialysis staff have tried to cannulate her fistula however she is unable to run appropriately and experiences prolonged bleeding treatments.  The patient has a right IJ PermCath which is functioning well.  This PermCath has been in place for approximately one year.  The dialysis center has been dialyzing her through this PermCath for the last two weeks as they refused to try to cannulate her fistula.  The patient denies any right upper extremity pain.  The patient underwent a left upper extremity HDA which was notable for total flow volume of 169 (previous flow volume in 01/2018 was 1245). Multiple areas with velocities less than 100.  She denies any fever, nausea vomiting.  Review of Systems  Constitutional: Negative.   HENT: Negative.   Eyes: Negative.   Respiratory: Negative.   Cardiovascular: Negative.   Gastrointestinal: Negative.   Endocrine: Negative.   Genitourinary:       ESRD  Musculoskeletal: Negative.   Skin: Negative.   Allergic/Immunologic: Negative.   Neurological: Negative.   Hematological: Negative.   Psychiatric/Behavioral: Negative.       Objective:   Physical Exam Constitutional:      Appearance: Normal appearance. She is normal weight.  HENT:     Head: Normocephalic and atraumatic.     Right Ear: External ear normal.     Left Ear: External ear normal.     Nose: Nose normal.     Mouth/Throat:     Mouth: Mucous membranes are dry.     Pharynx: Oropharynx is clear.  Eyes:     Extraocular Movements: Extraocular movements intact.     Conjunctiva/sclera: Conjunctivae normal.     Pupils: Pupils are equal, round, and reactive to light.  Neck:     Musculoskeletal: Normal  range of motion.  Cardiovascular:     Rate and Rhythm: Normal rate and regular rhythm.     Comments: Left upper extremity: Skin surrounding fistula is intact.  No erythema.  Palpable thrill. Pulmonary:     Effort: Pulmonary effort is normal.     Breath sounds: Normal breath sounds.  Skin:    General: Skin is warm and dry.  Neurological:     General: No focal deficit present.     Mental Status: She is alert and oriented to person, place, and time. Mental status is at baseline.  Psychiatric:        Mood and Affect: Mood normal.        Behavior: Behavior normal.        Thought Content: Thought content normal.        Judgment: Judgment normal.    BP 120/81 (BP Location: Left Arm, Patient Position: Sitting, Cuff Size: Large)   Pulse 98   Resp 10   Ht 5' 4"  (1.626 m)   Wt 244 lb (110.7 kg)   BMI 41.88 kg/m    Past Medical History:  Diagnosis Date  . Anemia   . Anxiety   . Asthma   . Diabetes mellitus   . Dysrhythmia   . 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 GERD (gastroesophageal reflux disease)   . History of MRSA infection 2002  right foot  . Hypertension   . Irregular heart beat   . Morbid obesity ([place])   . Renal insufficiency   . Seizures ([place])    was on medication for awhile then stopped taking it; no seizure for over 7-8 years (2012?)  . Sleep apnea    no problems since weight loss surgery 06/2015   Social History   Socioeconomic History  . Marital status: Single    Spouse name: Not on file  . Number of children: Not on file  . Years of education: Not on file  . Highest education level: Not on file  Occupational History  . Not on file  Social Needs  . Financial resource strain: Not very hard  . Food insecurity:    Worry: Not on file    Inability: Not on file  . Transportation needs:    Medical: No    Non-medical: No  Tobacco Use  . Smoking status: Light Tobacco Smoker    Packs/day: 0.25    Years: 10.00    Pack years: 2.50    Types:  Cigarettes  . Smokeless tobacco: Never Used  Substance and Sexual Activity  . Alcohol use: No  . Drug use: No  . Sexual activity: Yes  Lifestyle  . Physical activity:    Days per week: Not on file    Minutes per session: Not on file  . Stress: Not on file  Relationships  . Social connections:    Talks on phone: Three times a week    Gets together: Not on file    Attends religious service: Not on file    Active member of club or organization: Not on file    Attends meetings of clubs or organizations: Not on file    Relationship status: Not on file  . Intimate partner violence:    Fear of current or ex partner: No    Emotionally abused: Not on file    Physically abused: Not on file    Forced sexual activity: Not on file  Other Topics Concern  . Not on file  Social History Narrative  . Not on file   Past Surgical History:  Procedure Laterality Date  . A/V FISTULAGRAM Right 10/09/2017   Procedure: A/V FISTULAGRAM;  Surgeon: Algernon Huxley, MD;  Location: [place] [facility];  Service: Cardiovascular;  Laterality: Right;  . A/V FISTULAGRAM Right 12/21/2017   Procedure: A/V FISTULAGRAM;  Surgeon: Algernon Huxley, MD;  Location: [place] [facility];  Service: Cardiovascular;  Laterality: Right;  . AV FISTULA PLACEMENT  08/26/10   Left Radiocephalic AVF  . AV FISTULA PLACEMENT  08/26/2015   Procedure: ARTERIOVENOUS (AV) FISTULA CREATION;  Surgeon: Algernon Huxley, MD;  Location: [facility];  Service: Vascular;;  . AV FISTULA PLACEMENT Right 06/28/2017   Procedure: ARTERIOVENOUS (AV) FISTULA CREATION ( BRACHIAL BASILIC );  Surgeon: Algernon Huxley, MD;  Location: [facility];  Service: Vascular;  Laterality: Right;  . BACK SURGERY     infection in spine (rods, screws, spacers)  . BASCILIC VEIN TRANSPOSITION Right 08/16/2017   Procedure: BASCILIC VEIN TRANSPOSITION (2ND STAGE UPPER);  Surgeon: Algernon Huxley, MD;  Location: [facility];  Service: Vascular;  Laterality: Right;  . CARPAL TUNNEL RELEASE Left  05/26/2017   Procedure: CARPAL TUNNEL RELEASE;  Surgeon: Hessie Knows, MD;  Location: [facility];  Service: Orthopedics;  Laterality: Left;  . CARPAL TUNNEL RELEASE Left 06/09/2017   Procedure: CARPAL TUNNEL RELEASE;  Surgeon: Hessie Knows, MD;  Location: [facility];  Service: Orthopedics;  Laterality: Left;  . CARPAL TUNNEL RELEASE Right 2014  . DIALYSIS/PERMA CATHETER REMOVAL N/A 03/07/2016   Procedure: Dialysis/Perma Catheter Removal;  Surgeon: Algernon Huxley, MD;  Location: [place] [facility];  Service: Cardiovascular;  Laterality: N/A;  . EYE SURGERY     laser photocoagulation  . EYE SURGERY Right 2008   removal of blood clot  . GASTRIC BYPASS  06/2015  . INTRAUTERINE DEVICE (IUD) INSERTION    . METATARSAL HEAD EXCISION Right 03/16/2018   Procedure: OSTECTOMY MET HEAD 2;  Surgeon: Albertine Patricia, DPM;  Location: [facility];  Service: Podiatry;  Laterality: Right;  . PERIPHERAL VASCULAR CATHETERIZATION N/A 07/07/2014   Procedure: A/V Shuntogram/Fistulagram;  Surgeon: Algernon Huxley, MD;  Location: [place] [facility];  Service: Cardiovascular;  Laterality: N/A;  . PERIPHERAL VASCULAR CATHETERIZATION N/A 07/07/2014   Procedure: A/V Shunt Intervention;  Surgeon: Algernon Huxley, MD;  Location: [place] [facility];  Service: Cardiovascular;  Laterality: N/A;  . PERIPHERAL VASCULAR CATHETERIZATION Right 01/12/2015   Procedure: A/V Shuntogram/Fistulagram;  Surgeon: Algernon Huxley, MD;  Location: [place] [facility];  Service: Cardiovascular;  Laterality: Right;  . PERIPHERAL VASCULAR CATHETERIZATION N/A 01/12/2015   Procedure: A/V Shunt Intervention;  Surgeon: Algernon Huxley, MD;  Location: [place] [facility];  Service: Cardiovascular;  Laterality: N/A;  . PERIPHERAL VASCULAR CATHETERIZATION Right 06/08/2015   Procedure: A/V Shuntogram/Fistulagram;  Surgeon: Algernon Huxley, MD;  Location: [place] [facility];  Service: Cardiovascular;  Laterality: Right;  . PERIPHERAL VASCULAR CATHETERIZATION N/A 06/08/2015    Procedure: A/V Shunt Intervention;  Surgeon: Algernon Huxley, MD;  Location: [place] [facility];  Service: Cardiovascular;  Laterality: N/A;  . PERIPHERAL VASCULAR CATHETERIZATION Right 07/14/2015   Procedure: Thrombectomy;  Surgeon: Katha Cabal, MD;  Location: [place] [facility];  Service: Cardiovascular;  Laterality: Right;  . PERIPHERAL VASCULAR CATHETERIZATION N/A 07/14/2015   Procedure: A/V Shuntogram/Fistulagram;  Surgeon: Katha Cabal, MD;  Location: [place] [facility];  Service: Cardiovascular;  Laterality: N/A;  . PERIPHERAL VASCULAR CATHETERIZATION N/A 07/17/2015   Procedure: Thrombectomy;  Surgeon: Katha Cabal, MD;  Location: [place] [facility];  Service: Cardiovascular;  Laterality: N/A;  . PERIPHERAL VASCULAR CATHETERIZATION N/A 07/17/2015   Procedure: Dialysis/Perma Catheter Insertion;  Surgeon: Katha Cabal, MD;  Location: [place] [facility];  Service: Cardiovascular;  Laterality: N/A;  . PERIPHERAL VASCULAR CATHETERIZATION N/A 08/05/2015   Procedure: Thrombectomy;  Surgeon: Katha Cabal, MD;  Location: [place] [facility];  Service: Cardiovascular;  Laterality: N/A;  declot of graft  . PERIPHERAL VASCULAR THROMBECTOMY N/A 05/10/2017   Procedure: PERIPHERAL VASCULAR THROMBECTOMY;  Surgeon: Algernon Huxley, MD;  Location: [place] [facility];  Service: Cardiovascular;  Laterality: N/A;  . TOE AMPUTATION     both big toes removed   Family History  Problem Relation Age of Onset  . Diabetes Mother   . Heart attack Mother   . Stroke Mother   . High Cholesterol Mother   . Diabetes Father    Allergies  Allergen Reactions  . Marcillin [Ampicillin] Rash and Other (See Comments)    Breathing and heart palp  Hot and sweating Did it involve swelling of the face/tongue/throat, SOB, or low BP? Yes Did it involve sudden or severe rash/hives, skin peeling, or any reaction on the inside of your mouth or nose? Yes Did you need to seek medical attention at a  hospital or doctor's  office? No When did it last happen?2 years  If all above answers are "NO", may proceed with cephalosporin use.   [name] Kitchen Penicillins Other (See Comments)    Heart races,sweats    . Latex Swelling and Rash  . Vancomycin Itching, Nausea Only and Rash    Thick pieces of skin pulls off.      Assessment & Plan:   Patient presents at the request of her dialysis center due to a poorly functioning left upper extremity fistula.  The patient notes that dialysis staff have tried to cannulate her fistula however she is unable to run appropriately and experiences prolonged bleeding treatments.  The patient has a right IJ PermCath which is functioning well.  This PermCath has been in place for approximately one year.  The dialysis center has been dialyzing her through this PermCath for the last two weeks as they refused to try to cannulate her fistula.  The patient denies any right upper extremity pain.  The patient underwent a left upper extremity HDA which was notable for total flow volume of 169 (previous flow volume in 01/2018 was 1245). Multiple areas with velocities less than 100.  She denies any fever, nausea vomiting.  1. End stage renal disease ([place]) - Stable The patient's left upper extremity fistula is not functioning appropriately. Today's HDA shows a decrease in her total flow volume from 1245 to169. The patient has had her PermCath in for approximately one year. Strongly recommend the patient undergo a left upper extremity fistulogram with possible intervention to assess the patient's anatomy.  If appropriate an attempt to revascularize the fistula can be made at that time to restore function. Once function is restored the patient will need to have her PermCath removed as it is been in for a year and she is at risk for infection/sepsis. Procedure, risks and benefits of plan to the patient. All questions answered. The patient wishes to proceed.  2. Complication from renal  dialysis device, sequela - Stable As above  Current Outpatient Medications on File Prior to Visit  Medication Sig Dispense Refill  . albuterol (PROVENTIL HFA;VENTOLIN HFA) 108 (90 Base) MCG/ACT inhaler Inhale 2 puffs into the lungs 2 (two) times daily as needed for wheezing or shortness of breath.     [name] Kitchen aspirin 81 MG tablet Take 81 mg by mouth every evening.     . Camphor-Eucalyptus-Menthol (VICKS VAPORUB EX) Apply 1 application topically daily as needed (congestion).    [name] Kitchen docusate sodium (COLACE) 100 MG capsule Take 100 mg by mouth daily as needed for mild constipation.     . fentaNYL (DURAGESIC - DOSED MCG/HR) 12 MCG/HR Place 12 mcg onto the skin every 3 (three) days.     [name] Kitchen FOSRENOL 1000 MG PACK Take 1 packet by mouth See admin instructions. Take 1 packet with each meal and with large snacks  6  . HYDROcodone-acetaminophen (NORCO) 5-325 MG tablet Take 1 tablet by mouth every 6 (six) hours as needed for moderate pain. 21 tablet 0  . insulin glargine (LANTUS) 100 UNIT/ML injection Inject 6 Units into the skin daily as needed (high blood sugar). If blood sugar is over 120 for several days in a row    . LORazepam (ATIVAN) 2 MG/ML injection Inject into the vein.    . midodrine (PROAMATINE) 10 MG tablet Take 10 mg by mouth 2 (two) times daily.    . naloxone (NARCAN) nasal spray 4 mg/0.1 mL Place 1 spray into the nose daily as needed (opioid overdose).    [name] Kitchen  omeprazole (PRILOSEC) 20 MG capsule Take 20 mg by mouth daily as needed (acid reflux).     [name] Kitchen oxyCODONE-acetaminophen (ROXICET) 5-325 MG tablet Take 1-2 tablets by mouth every 4 (four) hours as needed. (Patient taking differently: Take 1 tablet by mouth 2 (two) times daily. ) 30 tablet 0  . PEDIATRIC MULTIPLE VITAMINS PO Take 1 tablet by mouth every evening. [place]    . pravastatin (PRAVACHOL) 40 MG tablet Take 40 mg by mouth at bedtime.     . promethazine (PHENERGAN) 12.5 MG tablet Take 12.5 mg by mouth every 6 (six) hours as needed for nausea  or vomiting.    . Skin Protectants, Misc. (DERMACERIN) CREA Apply 1 application topically daily as needed (itching / dry skin).      No current facility-administered medications on file prior to visit.    There are no Patient Instructions on file for this visit. No follow-ups on file.  Jolly Bleicher A Rafaelita Foister, PA-C

## 2018-04-30 ENCOUNTER — Encounter: Payer: Self-pay | Admitting: *Deleted

## 2018-04-30 ENCOUNTER — Other Ambulatory Visit (INDEPENDENT_AMBULATORY_CARE_PROVIDER_SITE_OTHER): Payer: Self-pay | Admitting: Nurse Practitioner

## 2018-04-30 ENCOUNTER — Encounter
Admission: RE | Disposition: A | Discharge status: Home / Self Care | Payer: Self-pay | Source: Home / Self Care | Attending: Vascular Surgery

## 2018-04-30 ENCOUNTER — Ambulatory Visit
Admission: RE | Admit: 2018-04-30 | Discharge: 2018-04-30 | Disposition: A | Discharge status: Home / Self Care | Payer: Medicare Other | Attending: Vascular Surgery | Admitting: Vascular Surgery

## 2018-04-30 ENCOUNTER — Other Ambulatory Visit: Payer: Self-pay

## 2018-04-30 DIAGNOSIS — N186 End stage renal disease: Secondary | ICD-10-CM | POA: Diagnosis not present

## 2018-04-30 DIAGNOSIS — Z539 Procedure and treatment not carried out, unspecified reason: Secondary | ICD-10-CM | POA: Insufficient documentation

## 2018-04-30 LAB — HCG, QUANTITATIVE, PREGNANCY: hCG, Beta Chain, Quant, S: 9 m[IU]/mL — ABNORMAL HIGH (ref <5)

## 2018-04-30 LAB — POTASSIUM (ARMC VASCULAR LAB ONLY): POTASSIUM (ARMC VASCULAR LAB): 6.2 — AB (ref 3.5–5.1)

## 2018-04-30 LAB — GLUCOSE, CAPILLARY: Glucose-Capillary: 91 mg/dL (ref 70–99)

## 2018-04-30 LAB — POTASSIUM: Potassium: 5.9 mmol/L — ABNORMAL HIGH (ref 3.5–5.1)

## 2018-04-30 SURGERY — A/V FISTULAGRAM
Anesthesia: General | Laterality: Right

## 2018-04-30 MED ORDER — FAMOTIDINE 20 MG PO TABS: 40.0000 mg | ORAL_TABLET | Freq: Once | ORAL | Status: DC | PRN

## 2018-04-30 MED ORDER — MIDAZOLAM HCL 2 MG/ML PO SYRP: 8.0000 mg | ORAL_SOLUTION | Freq: Once | ORAL | Status: DC | PRN

## 2018-04-30 MED ORDER — CLINDAMYCIN PHOSPHATE 300 MG/50ML IV SOLN: 300.0000 mg | Freq: Once | INTRAVENOUS | Status: DC

## 2018-04-30 MED ORDER — ONDANSETRON HCL 4 MG/2ML IJ SOLN: 4.0000 mg | Freq: Four times a day (QID) | INTRAMUSCULAR | Status: DC | PRN

## 2018-04-30 MED ORDER — METHYLPREDNISOLONE SODIUM SUCC 125 MG IJ SOLR: 125.0000 mg | Freq: Once | INTRAMUSCULAR | Status: DC | PRN

## 2018-04-30 MED ORDER — DIPHENHYDRAMINE HCL 50 MG/ML IJ SOLN: 50.0000 mg | Freq: Once | INTRAMUSCULAR | Status: DC | PRN

## 2018-04-30 MED ORDER — SODIUM CHLORIDE 0.9 % IV SOLN: INTRAVENOUS | Status: DC

## 2018-04-30 MED ORDER — CLINDAMYCIN PHOSPHATE 300 MG/50ML IV SOLN
INTRAVENOUS | Status: AC
  Filled 2018-04-30: qty 50

## 2018-04-30 MED ORDER — HYDROMORPHONE HCL 1 MG/ML IJ SOLN: 1.0000 mg | Freq: Once | INTRAMUSCULAR | Status: DC | PRN

## 2018-04-30 NOTE — H&P (Signed)
[facility] History & Physical Update  The patient was interviewed and re-examined.  The patient's previous History and Physical has been reviewed and is unchanged.  There is no change in the plan of care. We plan to proceed with the scheduled procedure.  Leotis Pain, MD  04/30/2018, 10:59 AM

## 2018-04-30 NOTE — Anesthesia Preprocedure Evaluation (Deleted)
Anesthesia Evaluation  Patient identified by MRN, date of birth, ID band Patient awake    Reviewed: Allergy & Precautions, H&P , NPO status , Patient's Chart, lab work & pertinent test results  Airway        Dental   Pulmonary asthma , sleep apnea , Current Smoker,           Cardiovascular hypertension, negative cardio ROS       Neuro/Psych Seizures -,  PSYCHIATRIC DISORDERS Anxiety    GI/Hepatic Neg liver ROS, GERD  ,  Endo/Other  diabetesMorbid obesity  Renal/GU ESRF and DialysisRenal disease     Musculoskeletal   Abdominal   Peds  Hematology  (+) Blood dyscrasia, anemia ,   Anesthesia Other Findings Past Medical History: No date: Anemia No date: Anxiety No date: Asthma No date: Diabetes mellitus No date: Dysrhythmia Jun 17 2015: Gastric bypass status for obesity No date: GERD (gastroesophageal reflux disease) 2002: History of MRSA infection     Comment:  right foot No date: Hypertension No date: Irregular heart beat No date: Morbid obesity ([place]) No date: Renal insufficiency No date: Seizures [facility])     Comment:  was on medication for awhile then stopped taking it; no               seizure for over 7-8 years (2012?) No date: Sleep apnea     Comment:  no problems since weight loss surgery 06/2015  Past Surgical History: 10/09/2017: A/V FISTULAGRAM; Right     Comment:  Procedure: A/V FISTULAGRAM;  Surgeon: Algernon Huxley, MD;               Location: [place] [facility];  Service: Cardiovascular;              Laterality: Right; 12/21/2017: A/V FISTULAGRAM; Right     Comment:  Procedure: A/V FISTULAGRAM;  Surgeon: Algernon Huxley, MD;               Location: [place] [facility];  Service: Cardiovascular;              Laterality: Right; 08/26/10: AV FISTULA PLACEMENT     Comment:  Left Radiocephalic AVF 8/67/7373: AV FISTULA PLACEMENT     Comment:  Procedure: ARTERIOVENOUS (AV) FISTULA CREATION;     Surgeon: Algernon Huxley, MD;  Location: [facility];  Service:               Vascular;; 06/28/2017: AV FISTULA PLACEMENT; Right     Comment:  Procedure: ARTERIOVENOUS (AV) FISTULA CREATION (               BRACHIAL BASILIC );  Surgeon: Algernon Huxley, MD;                Location: [facility];  Service: Vascular;  Laterality:               Right; No date: BACK SURGERY     Comment:  infection in spine (rods, screws, spacers) 6/68/1594: BASCILIC VEIN TRANSPOSITION; Right     Comment:  Procedure: [place] (2ND STAGE               UPPER);  Surgeon: Algernon Huxley, MD;  Location: [facility];               Service: Vascular;  Laterality: Right; 05/26/2017: CARPAL TUNNEL RELEASE; Left     Comment:  Procedure: CARPAL TUNNEL RELEASE;  Surgeon: Rudene Christians,  Legrand Como, MD;  Location: [facility];  Service: Orthopedics;               Laterality: Left; 06/09/2017: CARPAL TUNNEL RELEASE; Left     Comment:  Procedure: CARPAL TUNNEL RELEASE;  Surgeon: Hessie Knows, MD;  Location: [facility];  Service: Orthopedics;               Laterality: Left; 2014: CARPAL TUNNEL RELEASE; Right 03/07/2016: DIALYSIS/PERMA CATHETER REMOVAL; N/A     Comment:  Procedure: Dialysis/Perma Catheter Removal;  Surgeon:               Algernon Huxley, MD;  Location: [place] [facility];                Service: Cardiovascular;  Laterality: N/A; No date: EYE SURGERY     Comment:  laser photocoagulation 2008: EYE SURGERY; Right     Comment:  removal of blood clot 06/2015: GASTRIC BYPASS No date: INTRAUTERINE DEVICE (IUD) INSERTION 03/16/2018: METATARSAL HEAD EXCISION; Right     Comment:  Procedure: OSTECTOMY MET HEAD 2;  Surgeon: Albertine Patricia, DPM;  Location: [facility];  Service: Podiatry;                Laterality: Right; 07/07/2014: PERIPHERAL VASCULAR CATHETERIZATION; N/A     Comment:  Procedure: A/V Shuntogram/Fistulagram;  Surgeon: Algernon Huxley, MD;  Location: [place] [facility];   Service:               Cardiovascular;  Laterality: N/A; 07/07/2014: PERIPHERAL VASCULAR CATHETERIZATION; N/A     Comment:  Procedure: A/V Shunt Intervention;  Surgeon: Algernon Huxley, MD;  Location: [place] [facility];  Service:               Cardiovascular;  Laterality: N/A; 01/12/2015: PERIPHERAL VASCULAR CATHETERIZATION; Right     Comment:  Procedure: A/V Shuntogram/Fistulagram;  Surgeon: Algernon Huxley, MD;  Location: [place] [facility];  Service:               Cardiovascular;  Laterality: Right; 01/12/2015: PERIPHERAL VASCULAR CATHETERIZATION; N/A     Comment:  Procedure: A/V Shunt Intervention;  Surgeon: Algernon Huxley, MD;  Location: [place] [facility];  Service:               Cardiovascular;  Laterality: N/A; 06/08/2015: PERIPHERAL VASCULAR CATHETERIZATION; Right     Comment:  Procedure: A/V Shuntogram/Fistulagram;  Surgeon: Algernon Huxley, MD;  Location: [place] [facility];  Service:               Cardiovascular;  Laterality: Right; 06/08/2015: PERIPHERAL VASCULAR CATHETERIZATION; N/A     Comment:  Procedure: A/V Shunt Intervention;  Surgeon: Algernon Huxley, MD;  Location: [place] [facility];  Service:               Cardiovascular;  Laterality: N/A; 07/14/2015: PERIPHERAL VASCULAR CATHETERIZATION; Right     Comment:  Procedure: Thrombectomy;  Surgeon: Katha Cabal,               MD;  Location: [place] [facility];  Service:               Cardiovascular;  Laterality: Right; 07/14/2015: PERIPHERAL VASCULAR CATHETERIZATION; N/A     Comment:  Procedure: A/V Shuntogram/Fistulagram;  Surgeon: Katha Cabal, MD;  Location: [place] [facility];  Service:              Cardiovascular;  Laterality: N/A; 07/17/2015: PERIPHERAL VASCULAR CATHETERIZATION; N/A     Comment:  Procedure: Thrombectomy;  Surgeon: Katha Cabal,               MD;  Location: [place] [facility];  Service:                Cardiovascular;  Laterality: N/A; 07/17/2015: PERIPHERAL VASCULAR CATHETERIZATION; N/A     Comment:  Procedure: Dialysis/Perma Catheter Insertion;  Surgeon:               Katha Cabal, MD;  Location: [place] [facility];                Service: Cardiovascular;  Laterality: N/A; 08/05/2015: PERIPHERAL VASCULAR CATHETERIZATION; N/A     Comment:  Procedure: Thrombectomy;  Surgeon: Katha Cabal,               MD;  Location: [place] [facility];  Service:               Cardiovascular;  Laterality: N/A;  declot of graft 05/10/2017: PERIPHERAL VASCULAR THROMBECTOMY; N/A     Comment:  Procedure: PERIPHERAL VASCULAR THROMBECTOMY;  Surgeon:               Algernon Huxley, MD;  Location: [place] [facility];                Service: Cardiovascular;  Laterality: N/A; No date: TOE AMPUTATION     Comment:  both big toes removed  BMI    Body Mass Index:  41.20 kg/m      Reproductive/Obstetrics negative OB ROS                             Anesthesia Physical Anesthesia Plan  ASA: III  Anesthesia Plan: General ETT   Post-op Pain Management:    Induction:   PONV Risk Score and Plan: Ondansetron, Dexamethasone and Midazolam  Airway Management Planned:   Additional Equipment:   Intra-op Plan:   Post-operative Plan:   Informed Consent: I have reviewed the patients History and Physical, chart, labs and discussed the procedure including the risks, benefits and alternatives for the proposed anesthesia with the patient or authorized representative who has indicated his/her understanding and acceptance.     Dental Advisory Given  Plan Discussed with: Anesthesiologist and CRNA  Anesthesia Plan Comments:         Anesthesia Quick Evaluation

## 2018-04-30 NOTE — Progress Notes (Signed)
K too high to do today.  Will reschedule.

## 2018-04-30 NOTE — Progress Notes (Signed)
Initial K+ 6.2 via I stat. Dr. Lucky Cowboy updated and serum lab ordered rechecked. Lab stick completed yielding 5.9. Dr. Lucky Cowboy and Dr. Oren Section w/ Anesthesia notified. Pt procedure canceled for today per Dr. Lucky Cowboy. Ok to send Pt home, to be rescheduled per office later this week.

## 2018-05-01 ENCOUNTER — Other Ambulatory Visit (INDEPENDENT_AMBULATORY_CARE_PROVIDER_SITE_OTHER): Payer: Self-pay | Admitting: Nurse Practitioner

## 2018-05-01 MED ORDER — CLINDAMYCIN PHOSPHATE 300 MG/50ML IV SOLN: 300.0000 mg | Freq: Once | INTRAVENOUS | Status: DC

## 2018-05-02 ENCOUNTER — Encounter: Payer: Self-pay | Admitting: Anesthesiology

## 2018-05-02 ENCOUNTER — Ambulatory Visit: Admission: RE | Admit: 2018-05-02 | Payer: Medicare Other | Source: Home / Self Care | Admitting: Vascular Surgery

## 2018-05-02 ENCOUNTER — Encounter: Admission: RE | Payer: Self-pay | Source: Home / Self Care

## 2018-05-02 SURGERY — A/V FISTULAGRAM
Anesthesia: General | Laterality: Right

## 2018-05-04 ENCOUNTER — Other Ambulatory Visit (INDEPENDENT_AMBULATORY_CARE_PROVIDER_SITE_OTHER): Payer: Self-pay | Admitting: Nurse Practitioner

## 2018-05-04 ENCOUNTER — Other Ambulatory Visit
Admission: RE | Admit: 2018-05-04 | Discharge: 2018-05-04 | Disposition: A | Discharge status: Home / Self Care | Payer: Medicare Other | Attending: Vascular Surgery | Admitting: Vascular Surgery

## 2018-05-04 DIAGNOSIS — N186 End stage renal disease: Secondary | ICD-10-CM | POA: Insufficient documentation

## 2018-05-07 ENCOUNTER — Other Ambulatory Visit (INDEPENDENT_AMBULATORY_CARE_PROVIDER_SITE_OTHER): Payer: Self-pay | Admitting: Nurse Practitioner

## 2018-05-09 MED ORDER — CLINDAMYCIN PHOSPHATE 300 MG/50ML IV SOLN
300.0000 mg | Freq: Once | INTRAVENOUS | Status: AC
  Administered 2018-06-27: 09:00:00 300 mg via INTRAVENOUS
  Filled 2018-05-09: qty 50

## 2018-05-10 ENCOUNTER — Encounter: Payer: Self-pay | Admitting: Anesthesiology

## 2018-05-10 ENCOUNTER — Ambulatory Visit: Admission: RE | Admit: 2018-05-10 | Payer: Medicare Other | Source: Home / Self Care | Admitting: Vascular Surgery

## 2018-05-10 SURGERY — A/V FISTULAGRAM
Anesthesia: General | Laterality: Right

## 2018-06-12 ENCOUNTER — Telehealth (INDEPENDENT_AMBULATORY_CARE_PROVIDER_SITE_OTHER): Payer: Self-pay

## 2018-06-12 ENCOUNTER — Encounter (INDEPENDENT_AMBULATORY_CARE_PROVIDER_SITE_OTHER): Payer: Self-pay

## 2018-06-12 NOTE — Telephone Encounter (Signed)
Patient is  scheduled for a right arm fistulagram with anesthesia on 06/27/2018 with Dr. Lucky Cowboy. Patient has a 7:00 am arrival time and it has been explained about Covid testing as well as zero visitor policy.

## 2018-06-14 NOTE — Telephone Encounter (Signed)
I called the patient and  told the patient she would need to have covid testing done on 06/26/2018 after her dialysis per PAT.

## 2018-06-25 ENCOUNTER — Other Ambulatory Visit (INDEPENDENT_AMBULATORY_CARE_PROVIDER_SITE_OTHER): Payer: Self-pay | Admitting: Nurse Practitioner

## 2018-06-26 ENCOUNTER — Other Ambulatory Visit: Payer: Self-pay

## 2018-06-26 ENCOUNTER — Other Ambulatory Visit
Admission: RE | Admit: 2018-06-26 | Discharge: 2018-06-26 | Disposition: A | Discharge status: Home / Self Care | Payer: Medicare Other | Source: Ambulatory Visit | Attending: Vascular Surgery | Admitting: Vascular Surgery

## 2018-06-26 DIAGNOSIS — E1122 Type 2 diabetes mellitus with diabetic chronic kidney disease: Secondary | ICD-10-CM | POA: Diagnosis not present

## 2018-06-26 DIAGNOSIS — Z992 Dependence on renal dialysis: Secondary | ICD-10-CM | POA: Diagnosis not present

## 2018-06-26 DIAGNOSIS — Z1159 Encounter for screening for other viral diseases: Secondary | ICD-10-CM | POA: Diagnosis present

## 2018-06-26 DIAGNOSIS — Z6841 Body Mass Index (BMI) 40.0 and over, adult: Secondary | ICD-10-CM | POA: Diagnosis not present

## 2018-06-26 DIAGNOSIS — Z8669 Personal history of other diseases of the nervous system and sense organs: Secondary | ICD-10-CM | POA: Diagnosis not present

## 2018-06-26 DIAGNOSIS — K219 Gastro-esophageal reflux disease without esophagitis: Secondary | ICD-10-CM | POA: Diagnosis not present

## 2018-06-26 DIAGNOSIS — F17210 Nicotine dependence, cigarettes, uncomplicated: Secondary | ICD-10-CM | POA: Diagnosis not present

## 2018-06-26 DIAGNOSIS — Z880 Allergy status to penicillin: Secondary | ICD-10-CM | POA: Diagnosis not present

## 2018-06-26 DIAGNOSIS — Z9884 Bariatric surgery status: Secondary | ICD-10-CM | POA: Diagnosis not present

## 2018-06-26 DIAGNOSIS — Z91040 Latex allergy status: Secondary | ICD-10-CM | POA: Diagnosis not present

## 2018-06-26 DIAGNOSIS — N186 End stage renal disease: Secondary | ICD-10-CM | POA: Diagnosis not present

## 2018-06-26 DIAGNOSIS — Z881 Allergy status to other antibiotic agents status: Secondary | ICD-10-CM | POA: Diagnosis not present

## 2018-06-26 DIAGNOSIS — Z8614 Personal history of Methicillin resistant Staphylococcus aureus infection: Secondary | ICD-10-CM | POA: Diagnosis not present

## 2018-06-26 DIAGNOSIS — Z89411 Acquired absence of right great toe: Secondary | ICD-10-CM | POA: Diagnosis not present

## 2018-06-26 DIAGNOSIS — T82858A Stenosis of vascular prosthetic devices, implants and grafts, initial encounter: Secondary | ICD-10-CM | POA: Diagnosis present

## 2018-06-26 DIAGNOSIS — Z8249 Family history of ischemic heart disease and other diseases of the circulatory system: Secondary | ICD-10-CM | POA: Diagnosis not present

## 2018-06-26 DIAGNOSIS — Z823 Family history of stroke: Secondary | ICD-10-CM | POA: Diagnosis not present

## 2018-06-26 DIAGNOSIS — I120 Hypertensive chronic kidney disease with stage 5 chronic kidney disease or end stage renal disease: Secondary | ICD-10-CM | POA: Diagnosis not present

## 2018-06-26 DIAGNOSIS — Z833 Family history of diabetes mellitus: Secondary | ICD-10-CM | POA: Diagnosis not present

## 2018-06-26 DIAGNOSIS — Z79899 Other long term (current) drug therapy: Secondary | ICD-10-CM | POA: Diagnosis not present

## 2018-06-26 DIAGNOSIS — Z89412 Acquired absence of left great toe: Secondary | ICD-10-CM | POA: Diagnosis not present

## 2018-06-26 DIAGNOSIS — E11649 Type 2 diabetes mellitus with hypoglycemia without coma: Secondary | ICD-10-CM | POA: Diagnosis not present

## 2018-06-26 DIAGNOSIS — J45909 Unspecified asthma, uncomplicated: Secondary | ICD-10-CM | POA: Diagnosis not present

## 2018-06-26 DIAGNOSIS — Y832 Surgical operation with anastomosis, bypass or graft as the cause of abnormal reaction of the patient, or of later complication, without mention of misadventure at the time of the procedure: Secondary | ICD-10-CM | POA: Diagnosis not present

## 2018-06-26 LAB — SARS CORONAVIRUS 2 BY RT PCR (HOSPITAL ORDER, PERFORMED IN ~~LOC~~ HOSPITAL LAB): SARS Coronavirus 2: NEGATIVE

## 2018-06-26 MED ORDER — CLINDAMYCIN PHOSPHATE 300 MG/50ML IV SOLN
300.0000 mg | Freq: Once | INTRAVENOUS | Status: DC
  Filled 2018-06-26: qty 50

## 2018-06-27 ENCOUNTER — Ambulatory Visit
Admission: RE | Admit: 2018-06-27 | Discharge: 2018-06-27 | Disposition: A | Discharge status: Home / Self Care | Payer: Medicare Other | Attending: Vascular Surgery | Admitting: Vascular Surgery

## 2018-06-27 ENCOUNTER — Ambulatory Visit: Payer: Medicare Other | Admitting: Anesthesiology

## 2018-06-27 ENCOUNTER — Encounter
Admission: RE | Disposition: A | Discharge status: Home / Self Care | Payer: Self-pay | Source: Home / Self Care | Attending: Vascular Surgery

## 2018-06-27 DIAGNOSIS — T82858A Stenosis of vascular prosthetic devices, implants and grafts, initial encounter: Secondary | ICD-10-CM | POA: Diagnosis not present

## 2018-06-27 DIAGNOSIS — N186 End stage renal disease: Secondary | ICD-10-CM

## 2018-06-27 DIAGNOSIS — Z9884 Bariatric surgery status: Secondary | ICD-10-CM | POA: Insufficient documentation

## 2018-06-27 DIAGNOSIS — Z880 Allergy status to penicillin: Secondary | ICD-10-CM | POA: Insufficient documentation

## 2018-06-27 DIAGNOSIS — Z881 Allergy status to other antibiotic agents status: Secondary | ICD-10-CM | POA: Insufficient documentation

## 2018-06-27 DIAGNOSIS — Z833 Family history of diabetes mellitus: Secondary | ICD-10-CM | POA: Insufficient documentation

## 2018-06-27 DIAGNOSIS — Z8669 Personal history of other diseases of the nervous system and sense organs: Secondary | ICD-10-CM | POA: Insufficient documentation

## 2018-06-27 DIAGNOSIS — Z89411 Acquired absence of right great toe: Secondary | ICD-10-CM | POA: Insufficient documentation

## 2018-06-27 DIAGNOSIS — Z79899 Other long term (current) drug therapy: Secondary | ICD-10-CM | POA: Insufficient documentation

## 2018-06-27 DIAGNOSIS — Z6841 Body Mass Index (BMI) 40.0 and over, adult: Secondary | ICD-10-CM | POA: Insufficient documentation

## 2018-06-27 DIAGNOSIS — K219 Gastro-esophageal reflux disease without esophagitis: Secondary | ICD-10-CM | POA: Insufficient documentation

## 2018-06-27 DIAGNOSIS — Z823 Family history of stroke: Secondary | ICD-10-CM | POA: Insufficient documentation

## 2018-06-27 DIAGNOSIS — Z89412 Acquired absence of left great toe: Secondary | ICD-10-CM | POA: Insufficient documentation

## 2018-06-27 DIAGNOSIS — Z992 Dependence on renal dialysis: Secondary | ICD-10-CM | POA: Insufficient documentation

## 2018-06-27 DIAGNOSIS — F17210 Nicotine dependence, cigarettes, uncomplicated: Secondary | ICD-10-CM | POA: Insufficient documentation

## 2018-06-27 DIAGNOSIS — E11649 Type 2 diabetes mellitus with hypoglycemia without coma: Secondary | ICD-10-CM | POA: Insufficient documentation

## 2018-06-27 DIAGNOSIS — J45909 Unspecified asthma, uncomplicated: Secondary | ICD-10-CM | POA: Insufficient documentation

## 2018-06-27 DIAGNOSIS — Z8249 Family history of ischemic heart disease and other diseases of the circulatory system: Secondary | ICD-10-CM | POA: Insufficient documentation

## 2018-06-27 DIAGNOSIS — Z91040 Latex allergy status: Secondary | ICD-10-CM | POA: Insufficient documentation

## 2018-06-27 DIAGNOSIS — T82898A Other specified complication of vascular prosthetic devices, implants and grafts, initial encounter: Secondary | ICD-10-CM | POA: Diagnosis not present

## 2018-06-27 DIAGNOSIS — E1122 Type 2 diabetes mellitus with diabetic chronic kidney disease: Secondary | ICD-10-CM | POA: Insufficient documentation

## 2018-06-27 DIAGNOSIS — Y832 Surgical operation with anastomosis, bypass or graft as the cause of abnormal reaction of the patient, or of later complication, without mention of misadventure at the time of the procedure: Secondary | ICD-10-CM | POA: Insufficient documentation

## 2018-06-27 DIAGNOSIS — I120 Hypertensive chronic kidney disease with stage 5 chronic kidney disease or end stage renal disease: Secondary | ICD-10-CM | POA: Insufficient documentation

## 2018-06-27 DIAGNOSIS — Z8614 Personal history of Methicillin resistant Staphylococcus aureus infection: Secondary | ICD-10-CM | POA: Insufficient documentation

## 2018-06-27 HISTORY — PX: A/V FISTULAGRAM: CATH118298

## 2018-06-27 LAB — GLUCOSE, CAPILLARY
Glucose-Capillary: 114 mg/dL — ABNORMAL HIGH (ref 70–99)
Glucose-Capillary: 102 mg/dL — ABNORMAL HIGH (ref 70–99)

## 2018-06-27 LAB — HCG, QUANTITATIVE, PREGNANCY: hCG, Beta Chain, Quant, S: 6 m[IU]/mL — ABNORMAL HIGH (ref <5)

## 2018-06-27 LAB — POTASSIUM (ARMC VASCULAR LAB ONLY): Potassium (ARMC vascular lab): 4.8 (ref 3.5–5.1)

## 2018-06-27 SURGERY — A/V FISTULAGRAM
Anesthesia: General | Laterality: Right

## 2018-06-27 MED ORDER — FENTANYL CITRATE (PF) 100 MCG/2ML IJ SOLN: 25.0000 ug | INTRAMUSCULAR | Status: DC | PRN

## 2018-06-27 MED ORDER — DIPHENHYDRAMINE HCL 50 MG/ML IJ SOLN: 50.0000 mg | Freq: Once | INTRAMUSCULAR | Status: DC | PRN

## 2018-06-27 MED ORDER — SODIUM CHLORIDE 0.9 % IV SOLN
INTRAVENOUS | Status: DC
  Administered 2018-06-27: 08:00:00 via INTRAVENOUS

## 2018-06-27 MED ORDER — FENTANYL CITRATE (PF) 100 MCG/2ML IJ SOLN
INTRAMUSCULAR | Status: AC
  Filled 2018-06-27: qty 2

## 2018-06-27 MED ORDER — MIDAZOLAM HCL 2 MG/ML PO SYRP: 8.0000 mg | ORAL_SOLUTION | Freq: Once | ORAL | Status: DC | PRN

## 2018-06-27 MED ORDER — PROPOFOL 10 MG/ML IV BOLUS
INTRAVENOUS | Status: AC
  Filled 2018-06-27: qty 20

## 2018-06-27 MED ORDER — IOHEXOL 300 MG/ML  SOLN
INTRAMUSCULAR | Status: DC | PRN
  Administered 2018-06-27: 5 mL via INTRAVENOUS

## 2018-06-27 MED ORDER — ONDANSETRON HCL 4 MG/2ML IJ SOLN
INTRAMUSCULAR | Status: AC
  Filled 2018-06-27: qty 2

## 2018-06-27 MED ORDER — PROPOFOL 10 MG/ML IV BOLUS
INTRAVENOUS | Status: DC | PRN
  Administered 2018-06-27: 140 mg via INTRAVENOUS
  Administered 2018-06-27: 30 mg via INTRAVENOUS

## 2018-06-27 MED ORDER — METHYLPREDNISOLONE SODIUM SUCC 125 MG IJ SOLR: 125.0000 mg | Freq: Once | INTRAMUSCULAR | Status: DC | PRN

## 2018-06-27 MED ORDER — CLINDAMYCIN PHOSPHATE 300 MG/50ML IV SOLN
INTRAVENOUS | Status: AC
  Filled 2018-06-27: qty 50

## 2018-06-27 MED ORDER — LIDOCAINE-EPINEPHRINE (PF) 1 %-1:200000 IJ SOLN
INTRAMUSCULAR | Status: AC
  Filled 2018-06-27: qty 30

## 2018-06-27 MED ORDER — LIDOCAINE HCL (CARDIAC) PF 100 MG/5ML IV SOSY
PREFILLED_SYRINGE | INTRAVENOUS | Status: DC | PRN
  Administered 2018-06-27: 80 mg via INTRAVENOUS

## 2018-06-27 MED ORDER — FENTANYL CITRATE (PF) 100 MCG/2ML IJ SOLN
INTRAMUSCULAR | Status: DC | PRN
  Administered 2018-06-27 (×2): 50 ug via INTRAVENOUS

## 2018-06-27 MED ORDER — FAMOTIDINE 20 MG PO TABS: 40.0000 mg | ORAL_TABLET | Freq: Once | ORAL | Status: DC | PRN

## 2018-06-27 MED ORDER — PHENYLEPHRINE HCL (PRESSORS) 10 MG/ML IV SOLN
INTRAVENOUS | Status: DC | PRN
  Administered 2018-06-27 (×2): 200 ug via INTRAVENOUS
  Administered 2018-06-27: 100 ug via INTRAVENOUS

## 2018-06-27 MED ORDER — HYDROMORPHONE HCL 1 MG/ML IJ SOLN: 1.0000 mg | Freq: Once | INTRAMUSCULAR | Status: DC | PRN

## 2018-06-27 MED ORDER — MIDAZOLAM HCL 2 MG/2ML IJ SOLN
INTRAMUSCULAR | Status: AC
  Filled 2018-06-27: qty 2

## 2018-06-27 MED ORDER — ONDANSETRON HCL 4 MG/2ML IJ SOLN
4.0000 mg | Freq: Four times a day (QID) | INTRAMUSCULAR | Status: DC | PRN
  Administered 2018-06-27: 4 mg via INTRAVENOUS

## 2018-06-27 MED ORDER — MIDAZOLAM HCL 2 MG/2ML IJ SOLN
INTRAMUSCULAR | Status: DC | PRN
  Administered 2018-06-27: 2 mg via INTRAVENOUS

## 2018-06-27 SURGICAL SUPPLY — 5 items
CANNULA 5F STIFF (CANNULA) ×6 IMPLANT
DRAPE BRACHIAL (DRAPES) ×3 IMPLANT
PACK ANGIOGRAPHY (CUSTOM PROCEDURE TRAY) ×3 IMPLANT
SHEATH BRITE TIP 6FRX5.5 (SHEATH) ×3 IMPLANT
SUT MNCRL AB 4-0 PS2 18 (SUTURE) ×3 IMPLANT

## 2018-06-27 NOTE — H&P (Signed)
[facility] Admission History & Physical  MRN : [id number]  [name] is a 33 y.o. (27-Jun-1985) female who presents with chief complaint of No chief complaint on file. [name] Kitchen  History of Present Illness: I am asked to evaluate the patient by the dialysis center. The patient was sent here because they were unable to achieve adequate dialysis recently. Furthermore the Center states there is very poor thrill and bruit. The patient states there there have been increasing problems with the access, such as "pulling clots" during dialysis and prolonged bleeding after decannulation. The patient estimates these problems have been going on for several weeks. The patient is unaware of any other change.  Patient denies pain or tenderness overlying the access.  There is no pain with dialysis.  The patient denies hand pain or finger pain consistent with steal syndrome.   There have been many past interventions or declots of this access.  The patient is not  chronically hypotensive on dialysis.  Current Facility-Administered Medications  Medication Dose Route Frequency Provider Last Rate Last Dose  . 0.9 %  sodium chloride infusion   Intravenous Continuous Kris Hartmann, NP 10 mL/hr at 06/27/18 [phone]    . clindamycin (CLEOCIN) 300 MG/50ML IVPB           . clindamycin (CLEOCIN) IVPB 300 mg  300 mg Intravenous Once Eulogio Ditch E, NP      . diphenhydrAMINE (BENADRYL) injection 50 mg  50 mg Intravenous Once PRN Kris Hartmann, NP      . famotidine (PEPCID) tablet 40 mg  40 mg Oral Once PRN Eulogio Ditch E, NP      . HYDROmorphone (DILAUDID) injection 1 mg  1 mg Intravenous Once PRN Kris Hartmann, NP      . lidocaine-EPINEPHrine (XYLOCAINE-EPINEPHrine) 1 %-1:200000 (PF) injection           . methylPREDNISolone sodium succinate (SOLU-MEDROL) 125 mg/2 mL injection 125 mg  125 mg Intravenous Once PRN Kris Hartmann, NP      . midazolam (VERSED) 2 MG/ML syrup 8 mg  8 mg Oral  Once PRN Kris Hartmann, NP      . ondansetron [facility]) injection 4 mg  4 mg Intravenous Q6H PRN Kris Hartmann, NP       Facility-Administered Medications Ordered in Other Encounters  Medication Dose Route Frequency Provider Last Rate Last Dose  . clindamycin (CLEOCIN) IVPB 300 mg  300 mg Intravenous Once Kris Hartmann, NP        Past Medical History:  Diagnosis Date  . Anemia   . Anxiety   . Asthma   . Diabetes mellitus   . Dysrhythmia   . 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 GERD (gastroesophageal reflux disease)   . History of MRSA infection 2002   right foot  . Hypertension   . Irregular heart beat   . Morbid obesity ([place])   . Renal insufficiency   . Seizures ([place])    was on medication for awhile then stopped taking it; no seizure for over 7-8 years (2012?)  . Sleep apnea    no problems since weight loss surgery 06/2015    Past Surgical History:  Procedure Laterality Date  . A/V FISTULAGRAM Right 10/09/2017   Procedure: A/V FISTULAGRAM;  Surgeon: Algernon Huxley, MD;  Location: [place] [facility];  Service: Cardiovascular;  Laterality: Right;  . A/V FISTULAGRAM Right 12/21/2017   Procedure: A/V FISTULAGRAM;  Surgeon: Algernon Huxley, MD;  Location: [place] [facility];  Service: Cardiovascular;  Laterality: Right;  . AV FISTULA PLACEMENT  08/26/10   Left Radiocephalic AVF  . AV FISTULA PLACEMENT  08/26/2015   Procedure: ARTERIOVENOUS (AV) FISTULA CREATION;  Surgeon: Algernon Huxley, MD;  Location: [facility];  Service: Vascular;;  . AV FISTULA PLACEMENT Right 06/28/2017   Procedure: ARTERIOVENOUS (AV) FISTULA CREATION ( BRACHIAL BASILIC );  Surgeon: Algernon Huxley, MD;  Location: [facility];  Service: Vascular;  Laterality: Right;  . BACK SURGERY     infection in spine (rods, screws, spacers)  . BASCILIC VEIN TRANSPOSITION Right 08/16/2017   Procedure: BASCILIC VEIN TRANSPOSITION (2ND STAGE UPPER);  Surgeon: Algernon Huxley, MD;  Location: [facility];  Service: Vascular;   Laterality: Right;  . CARPAL TUNNEL RELEASE Left 05/26/2017   Procedure: CARPAL TUNNEL RELEASE;  Surgeon: Hessie Knows, MD;  Location: [facility];  Service: Orthopedics;  Laterality: Left;  . CARPAL TUNNEL RELEASE Left 06/09/2017   Procedure: CARPAL TUNNEL RELEASE;  Surgeon: Hessie Knows, MD;  Location: [facility];  Service: Orthopedics;  Laterality: Left;  . CARPAL TUNNEL RELEASE Right 2014  . DIALYSIS/PERMA CATHETER REMOVAL N/A 03/07/2016   Procedure: Dialysis/Perma Catheter Removal;  Surgeon: Algernon Huxley, MD;  Location: [place] [facility];  Service: Cardiovascular;  Laterality: N/A;  . EYE SURGERY     laser photocoagulation  . EYE SURGERY Right 2008   removal of blood clot  . GASTRIC BYPASS  06/2015  . INTRAUTERINE DEVICE (IUD) INSERTION    . METATARSAL HEAD EXCISION Right 03/16/2018   Procedure: OSTECTOMY MET HEAD 2;  Surgeon: Albertine Patricia, DPM;  Location: [facility];  Service: Podiatry;  Laterality: Right;  . PERIPHERAL VASCULAR CATHETERIZATION N/A 07/07/2014   Procedure: A/V Shuntogram/Fistulagram;  Surgeon: Algernon Huxley, MD;  Location: [place] [facility];  Service: Cardiovascular;  Laterality: N/A;  . PERIPHERAL VASCULAR CATHETERIZATION N/A 07/07/2014   Procedure: A/V Shunt Intervention;  Surgeon: Algernon Huxley, MD;  Location: [place] [facility];  Service: Cardiovascular;  Laterality: N/A;  . PERIPHERAL VASCULAR CATHETERIZATION Right 01/12/2015   Procedure: A/V Shuntogram/Fistulagram;  Surgeon: Algernon Huxley, MD;  Location: [place] [facility];  Service: Cardiovascular;  Laterality: Right;  . PERIPHERAL VASCULAR CATHETERIZATION N/A 01/12/2015   Procedure: A/V Shunt Intervention;  Surgeon: Algernon Huxley, MD;  Location: [place] [facility];  Service: Cardiovascular;  Laterality: N/A;  . PERIPHERAL VASCULAR CATHETERIZATION Right 06/08/2015   Procedure: A/V Shuntogram/Fistulagram;  Surgeon: Algernon Huxley, MD;  Location: [place] [facility];  Service: Cardiovascular;  Laterality: Right;  .  PERIPHERAL VASCULAR CATHETERIZATION N/A 06/08/2015   Procedure: A/V Shunt Intervention;  Surgeon: Algernon Huxley, MD;  Location: [place] [facility];  Service: Cardiovascular;  Laterality: N/A;  . PERIPHERAL VASCULAR CATHETERIZATION Right 07/14/2015   Procedure: Thrombectomy;  Surgeon: Katha Cabal, MD;  Location: [place] [facility];  Service: Cardiovascular;  Laterality: Right;  . PERIPHERAL VASCULAR CATHETERIZATION N/A 07/14/2015   Procedure: A/V Shuntogram/Fistulagram;  Surgeon: Katha Cabal, MD;  Location: [place] [facility];  Service: Cardiovascular;  Laterality: N/A;  . PERIPHERAL VASCULAR CATHETERIZATION N/A 07/17/2015   Procedure: Thrombectomy;  Surgeon: Katha Cabal, MD;  Location: [place] [facility];  Service: Cardiovascular;  Laterality: N/A;  . PERIPHERAL VASCULAR CATHETERIZATION N/A 07/17/2015   Procedure: Dialysis/Perma Catheter Insertion;  Surgeon: Katha Cabal, MD;  Location: [facility];  Service: Cardiovascular;  Laterality: N/A;  .  PERIPHERAL VASCULAR CATHETERIZATION N/A 08/05/2015   Procedure: Thrombectomy;  Surgeon: Katha Cabal, MD;  Location: [place] [facility];  Service: Cardiovascular;  Laterality: N/A;  declot of graft  . PERIPHERAL VASCULAR THROMBECTOMY N/A 05/10/2017   Procedure: PERIPHERAL VASCULAR THROMBECTOMY;  Surgeon: Algernon Huxley, MD;  Location: [place] [facility];  Service: Cardiovascular;  Laterality: N/A;  . TOE AMPUTATION     both big toes removed    Social History Social History   Tobacco Use  . Smoking status: Light Tobacco Smoker    Packs/day: 0.25    Years: 10.00    Pack years: 2.50    Types: Cigarettes  . Smokeless tobacco: Never Used  Substance Use Topics  . Alcohol use: No  . Drug use: No    Family History Family History  Problem Relation Age of Onset  . Diabetes Mother   . Heart attack Mother   . Stroke Mother   . High Cholesterol Mother   . Diabetes Father     No family history of bleeding or  clotting disorders, autoimmune disease or porphyria  Allergies  Allergen Reactions  . Marcillin [Ampicillin] Rash and Other (See Comments)    Breathing and heart palp  Hot and sweating Did it involve swelling of the face/tongue/throat, SOB, or low BP? Yes Did it involve sudden or severe rash/hives, skin peeling, or any reaction on the inside of your mouth or nose? Yes Did you need to seek medical attention at a hospital or doctor's office? No When did it last happen?2 years  If all above answers are "NO", may proceed with cephalosporin use.   [name] Kitchen Penicillins Other (See Comments)    Heart races,sweats    . Latex Swelling and Rash  . Vancomycin Itching, Nausea Only and Rash    Thick pieces of skin pulls off.     REVIEW OF SYSTEMS (Negative unless checked)  Constitutional: []Weight loss  []Fever  []Chills Cardiac: []Chest pain   []Chest pressure   []Palpitations   []Shortness of breath when laying flat   []Shortness of breath at rest   [x]Shortness of breath with exertion. Vascular:  []Pain in legs with walking   []Pain in legs at rest   []Pain in legs when laying flat   []Claudication   []Pain in feet when walking  []Pain in feet at rest  []Pain in feet when laying flat   []History of DVT   []Phlebitis   []Swelling in legs   []Varicose veins   []Non-healing ulcers Pulmonary:   []Uses home oxygen   []Productive cough   []Hemoptysis   []Wheeze  []COPD   []Asthma Neurologic:  []Dizziness  []Blackouts   []Seizures   []History of stroke   []History of TIA  []Aphasia   []Temporary blindness   []Dysphagia   []Weakness or numbness in arms   []Weakness or numbness in legs Musculoskeletal:  []Arthritis   []Joint swelling   []Joint pain   []Low back pain Hematologic:  []Easy bruising  []Easy bleeding   []Hypercoagulable state   []Anemic  []Hepatitis Gastrointestinal:  []Blood in stool   []Vomiting blood  [x]Gastroesophageal reflux/heartburn   []Difficulty swallowing. Genitourinary:  [x]Chronic  kidney disease   []Difficult urination  []Frequent urination  []Burning with urination   []Blood in urine Skin:  []Rashes   []Ulcers   []Wounds Psychological:  []History of anxiety   [] History of major depression.  Physical Examination  Vitals:   06/27/18 0713  BP: 118/88  Pulse: 97  Resp: 19  Temp: 98.5 F (36.9 C)  TempSrc: Oral  SpO2: 100%  Weight: 108.9 kg  Height: 5' 4" (1.626 m)   Body mass index is 41.2 kg/m. Gen: WD/WN, NAD Head: [place]/AT, No temporalis wasting.  Ear/Nose/Throat: Hearing grossly intact, nares w/o erythema or drainage, oropharynx w/o Erythema/Exudate,  Eyes: Conjunctiva clear, sclera non-icteric Neck: Trachea midline.  No JVD.  Pulmonary:  Good air movement, respirations not labored, no use of accessory muscles.  Cardiac: RRR, normal S1, S2. Vascular: weak thrill right arm AVF Vessel Right Left  Radial Palpable Palpable   Musculoskeletal: M/S 5/5 throughout.  Extremities without ischemic changes.  No deformity or atrophy.  Neurologic: Sensation grossly intact in extremities.  Symmetrical.  Speech is fluent. Motor exam as listed above. Psychiatric: Judgment intact, Mood & affect appropriate for pt's clinical situation. Dermatologic: No rashes or ulcers noted.  No cellulitis or open wounds.    CBC Lab Results  Component Value Date   WBC 7.4 02/19/2018   HGB 13.3 03/16/2018   HCT 39.0 03/16/2018   MCV 93.5 02/19/2018   PLT 235 02/19/2018    BMET    Component Value Date/Time   NA 135 03/16/2018 0840   NA 132 (L) 02/03/2014 1007   K 5.9 (H) 04/30/2018 1146   K 4.7 02/03/2014 1007   CL 96 (L) 02/19/2018 0958   CL 93 (L) 02/03/2014 1007   CO2 28 02/19/2018 0958   CO2 25 02/03/2014 1007   GLUCOSE 112 (H) 03/16/2018 0840   GLUCOSE 258 (H) 02/03/2014 1007   BUN 55 (H) 02/19/2018 0958   BUN 58 (H) 02/03/2014 1007   CREATININE 10.67 (H) 02/19/2018 0958   CREATININE 11.14 (H) 02/03/2014 1007   CALCIUM 8.3 (L) 02/19/2018 0958   CALCIUM 8.4 (L)  02/03/2014 1007   CALCIUM 8.1 (L) 08/23/2010 1000   GFRNONAA 4 (L) 02/19/2018 0958   GFRNONAA 4 (L) 02/03/2014 1007   GFRNONAA 5 (L) 07/19/2013 0416   GFRAA 5 (L) 02/19/2018 0958   GFRAA 5 (L) 02/03/2014 1007   GFRAA 6 (L) 07/19/2013 0416   CrCl cannot be calculated (Patient's most recent lab result is older than the maximum 21 days allowed.).  COAG Lab Results  Component Value Date   INR 1.07 02/19/2018   INR 1.05 08/16/2017   INR 1.07 06/28/2017    Radiology No results found.  Assessment/Plan 1.  Complication dialysis device with dysfunction of AV access:  Patient's dialysis access is malfunctioning. The patient will undergo angiography and correction of any problems using interventional techniques with the hope of restoring function to the access.  The risks and benefits were described to the patient.  All questions were answered.  The patient agrees to proceed with angiography and intervention. Potassium will be drawn to ensure that it is an appropriate level prior to performing intervention. 2.  End-stage renal disease requiring hemodialysis:  Patient will continue dialysis therapy without further interruption if a successful intervention is not achieved then a tunneled catheter will be placed. Dialysis has already been arranged. 3.  Hypertension:  Patient will continue medical management; nephrology is following no changes in oral medications. 4. Diabetes mellitus:  Glucose will be monitored and oral medications been held this morning once the patient has undergone the patient's procedure po intake will be reinitiated and again Accu-Cheks will be used to assess the blood glucose level and treat as needed. The patient will be restarted on the patient's usual hypoglycemic regime     Corene Cornea  Lucky Cowboy, MD  06/27/2018 8:09 AM

## 2018-06-27 NOTE — Progress Notes (Signed)
Patient discharged home prior to me talking to her due to her significant other needing to be at work.  Tried to call her cell phone to give her the results of the procedure but there was no answer.  Message was left that the fistula was occluded and that we would have to evaluate her for a new access.  Follow-up in the office in 1 to 2 weeks.

## 2018-06-27 NOTE — Anesthesia Preprocedure Evaluation (Addendum)
Anesthesia Evaluation  Patient identified by MRN, date of birth, ID band Patient awake    Reviewed: Allergy & Precautions, H&P , NPO status , Patient's Chart, lab work & pertinent test results  History of Anesthesia Complications Negative for: history of anesthetic complications  Airway Mallampati: III  TM Distance: >3 FB Neck ROM: full   Comment: TM 3 FB Dental  (+) Teeth Intact   Pulmonary asthma , sleep apnea , Current Smoker,    breath sounds clear to auscultation       Cardiovascular hypertension,  Rhythm:regular Rate:Normal     Neuro/Psych Seizures - (seizure 2  yrs ago),  PSYCHIATRIC DISORDERS Anxiety    GI/Hepatic Neg liver ROS, GERD  Controlled,  Endo/Other  diabetesMorbid obesity  Renal/GU ESRFRenal disease     Musculoskeletal   Abdominal   Peds  Hematology  (+) Blood dyscrasia, anemia ,   Anesthesia Other Findings Past Medical History: No date: Anemia No date: Asthma No date: Diabetes mellitus No date: Dysrhythmia Jun 17 2015: Gastric bypass status for obesity No date: GERD (gastroesophageal reflux disease) No date: Hypertension No date: Irregular heart beat No date: Morbid obesity ([place]) No date: Renal insufficiency No date: Seizures ([place]) No date: Sleep apnea  Past Surgical History: 08/26/10: AV FISTULA PLACEMENT     Comment:  Left Radiocephalic AVF 08/01/7791: AV FISTULA PLACEMENT     Comment:  Procedure: ARTERIOVENOUS (AV) FISTULA CREATION;                Surgeon: Algernon Huxley, MD;  Location: [facility];  Service:               Vascular;; 06/28/2017: AV FISTULA PLACEMENT; Right     Comment:  Procedure: ARTERIOVENOUS (AV) FISTULA CREATION (               BRACHIAL BASILIC );  Surgeon: Algernon Huxley, MD;                Location: [facility];  Service: Vascular;  Laterality:               Right; No date: BACK SURGERY 10/04/90: [place]; Right     Comment:  Procedure: BASCILIC  VEIN TRANSPOSITION (2ND STAGE               UPPER);  Surgeon: Algernon Huxley, MD;  Location: [facility];               Service: Vascular;  Laterality: Right; 05/26/2017: CARPAL TUNNEL RELEASE; Left     Comment:  Procedure: CARPAL TUNNEL RELEASE;  Surgeon: Hessie Knows, MD;  Location: [facility];  Service: Orthopedics;               Laterality: Left; 06/09/2017: CARPAL TUNNEL RELEASE; Left     Comment:  Procedure: CARPAL TUNNEL RELEASE;  Surgeon: Hessie Knows, MD;  Location: [facility];  Service: Orthopedics;               Laterality: Left; 03/07/2016: DIALYSIS/PERMA CATHETER REMOVAL; N/A     Comment:  Procedure: Dialysis/Perma Catheter Removal;  Surgeon:               Algernon Huxley, MD;  Location: [place] [facility];                Service: Cardiovascular;  Laterality: N/A; No date: EYE SURGERY     Comment:  laser photocoagulation 2008: EYE SURGERY; Right     Comment:  removal of blood clot No date: INTRAUTERINE DEVICE (IUD) INSERTION 07/07/2014: PERIPHERAL VASCULAR CATHETERIZATION; N/A     Comment:  Procedure: A/V Shuntogram/Fistulagram;  Surgeon: Algernon Huxley, MD;  Location: [place] [facility];  Service:               Cardiovascular;  Laterality: N/A; 07/07/2014: PERIPHERAL VASCULAR CATHETERIZATION; N/A     Comment:  Procedure: A/V Shunt Intervention;  Surgeon: Algernon Huxley, MD;  Location: [place] [facility];  Service:               Cardiovascular;  Laterality: N/A; 01/12/2015: PERIPHERAL VASCULAR CATHETERIZATION; Right     Comment:  Procedure: A/V Shuntogram/Fistulagram;  Surgeon: Algernon Huxley, MD;  Location: [place] [facility];  Service:               Cardiovascular;  Laterality: Right; 01/12/2015: PERIPHERAL VASCULAR CATHETERIZATION; N/A     Comment:  Procedure: A/V Shunt Intervention;  Surgeon: Algernon Huxley, MD;  Location: [place] [facility];  Service:               Cardiovascular;  Laterality:  N/A; 06/08/2015: PERIPHERAL VASCULAR CATHETERIZATION; Right     Comment:  Procedure: A/V Shuntogram/Fistulagram;  Surgeon: Algernon Huxley, MD;  Location: [place] [facility];  Service:               Cardiovascular;  Laterality: Right; 06/08/2015: PERIPHERAL VASCULAR CATHETERIZATION; N/A     Comment:  Procedure: A/V Shunt Intervention;  Surgeon: Algernon Huxley, MD;  Location: [place] [facility];  Service:               Cardiovascular;  Laterality: N/A; 07/14/2015: PERIPHERAL VASCULAR CATHETERIZATION; Right     Comment:  Procedure: Thrombectomy;  Surgeon: Katha Cabal,               MD;  Location: [place] [facility];  Service:               Cardiovascular;  Laterality: Right; 07/14/2015: PERIPHERAL VASCULAR CATHETERIZATION; N/A     Comment:  Procedure: A/V Shuntogram/Fistulagram;  Surgeon: Katha Cabal, MD;  Location: [place] [facility];  Service:              Cardiovascular;  Laterality: N/A; 07/17/2015: PERIPHERAL VASCULAR CATHETERIZATION; N/A     Comment:  Procedure: Thrombectomy;  Surgeon: Katha Cabal,               MD;  Location: [place] [facility];  Service:               Cardiovascular;  Laterality: N/A; 07/17/2015: PERIPHERAL VASCULAR CATHETERIZATION; N/A     Comment:  Procedure: Dialysis/Perma Catheter Insertion;  Surgeon:  Katha Cabal, MD;  Location: [place] [facility];                Service: Cardiovascular;  Laterality: N/A; 08/05/2015: PERIPHERAL VASCULAR CATHETERIZATION; N/A     Comment:  Procedure: Thrombectomy;  Surgeon: Katha Cabal,               MD;  Location: [place] [facility];  Service:               Cardiovascular;  Laterality: N/A;  declot of graft 05/10/2017: PERIPHERAL VASCULAR THROMBECTOMY; N/A     Comment:  Procedure: PERIPHERAL VASCULAR THROMBECTOMY;  Surgeon:               Algernon Huxley, MD;  Location: [place] [facility];                Service: Cardiovascular;  Laterality:  N/A; No date: TOE AMPUTATION     Comment:  both big toes removed  BMI    Body Mass Index:  41.20 kg/m      Reproductive/Obstetrics negative OB ROS                            Anesthesia Physical  Anesthesia Plan  ASA: IV  Anesthesia Plan: General LMA   Post-op Pain Management:    Induction:   PONV Risk Score and Plan: Ondansetron and Dexamethasone  Airway Management Planned:   Additional Equipment:   Intra-op Plan:   Post-operative Plan:   Informed Consent: I have reviewed the patients History and Physical, chart, labs and discussed the procedure including the risks, benefits and alternatives for the proposed anesthesia with the patient or authorized representative who has indicated his/her understanding and acceptance.     Dental Advisory Given  Plan Discussed with: Anesthesiologist and CRNA  Anesthesia Plan Comments:         Anesthesia Quick Evaluation

## 2018-06-27 NOTE — Anesthesia Procedure Notes (Signed)
Procedure Name: LMA Insertion Date/Time: 06/27/2018 8:46 AM Performed by: Rona Ravens, CRNA Pre-anesthesia Checklist: Patient identified, Emergency Drugs available, Suction available and Patient being monitored Patient Re-evaluated:Patient Re-evaluated prior to induction Oxygen Delivery Method: Circle system utilized Preoxygenation: Pre-oxygenation with 100% oxygen Induction Type: IV induction LMA: LMA inserted LMA Size: 4.5 Number of attempts: 1 Placement Confirmation: positive ETCO2 and breath sounds checked- equal and bilateral

## 2018-06-27 NOTE — Anesthesia Postprocedure Evaluation (Signed)
Anesthesia Post Note  Patient: [name]  Procedure(s) Performed: A/V FISTULAGRAM (Right )  Patient location during evaluation: PACU Anesthesia Type: General Level of consciousness: awake and alert Pain management: pain level controlled Vital Signs Assessment: post-procedure vital signs reviewed and stable Respiratory status: spontaneous breathing, nonlabored ventilation and respiratory function stable Cardiovascular status: blood pressure returned to baseline and stable Postop Assessment: no apparent nausea or vomiting Anesthetic complications: no     Last Vitals:  Vitals:   06/27/18 1019 06/27/18 1030  BP: (!) 93/59 100/68  Pulse: 86 93  Resp: 16 17  Temp: (!) 36.3 C   SpO2: 96% 98%    Last Pain:  Vitals:   06/27/18 1030  TempSrc:   PainSc: 0-No pain                 Durenda Hurt

## 2018-06-27 NOTE — Op Note (Signed)
[name]    OPERATIVE NOTE   PROCEDURE: 1.   Right brachiobasilic and right brachiocephalic arteriovenous fistula cannulation under ultrasound guidance 2.   Right arm fistulagram   PRE-OPERATIVE DIAGNOSIS: 1. ESRD 2. Poorly functional right arm AVF  POST-OPERATIVE DIAGNOSIS: same as above with occlusion of the brachiobasilic AV fistula and the known previous occlusion of the brachiocephalic AV fistula which was not salvageable  SURGEON: Leotis Pain, MD  ANESTHESIA: local with MCS  ESTIMATED BLOOD LOSS: Minimal  FINDING(S): 1. Small atretic brachiobasilic AV fistula which was not salvageable.  SPECIMEN(S):  None  CONTRAST: 5 cc  FLUORO TIME: 0.2 minutes  ANESTHESIA: General  INDICATIONS: [name] is a 33 y.o. female who presents with malfunctioning right brachiobasilic arteriovenous fistula.  The patient is scheduled for right arm fistulagram.  The patient is aware the risks include but are not limited to: bleeding, infection, thrombosis of the cannulated access, and possible anaphylactic reaction to the contrast.  The patient is aware of the risks of the procedure and elects to proceed forward.  DESCRIPTION: After full informed written consent was obtained, the patient was brought back to the angiography suite and placed supine upon the angiography table.  The patient was connected to monitoring equipment. Moderate conscious sedation was administered with a face to face encounter with the patient throughout the procedure with my supervision of the RN administering medicines and monitoring the patient's vital signs and mental status throughout from the start of the procedure until the patient was taken to the recovery room. The right arm was prepped and draped in the standard fashion for a percutaneous access intervention.  Under ultrasound guidance, the right brachiobasilic AV fistula was evaluated.  This was small and atretic and clearly  chronically occluded.  I will access this with a micropuncture needle but could not get a wire to feed.  Images through the micropuncture needle showed only a blob with no flow in the fistula.  Evaluation of this access was then abandoned as it was clear that this would not be a salvageable access.  She had a previous right brachiocephalic AV fistula with stents in place.  This was known to be occluded, but I felt we can evaluate to see if this had to have any hope and salvage.  I accessed in the stented portion of the brachiocephalic AV fistula with a micropuncture needle under direct ultrasound guidance and a permanent image was recorded.  A micropuncture wire and sheath were then placed.  Imaging showed a newly occluded fistula with atretic venous outflow and serous fluid within the chronically occluded stents.  Based on the images, this patient will need a new surgical access as her right upper extremity AV accesses were not salvageable. The micropuncture sheath was removed.  A sterile bandage was applied to the puncture site.  COMPLICATIONS: None  CONDITION: Stable   Leotis Pain  06/27/2018 9:36 AM   This note was created with Dragon Medical transcription system. Any errors in dictation are purely unintentional.

## 2018-06-27 NOTE — Discharge Instructions (Signed)
Moderate Conscious Sedation, Adult, Care After  These instructions provide you with information about caring for yourself after your procedure. Your health care provider may also give you more specific instructions. Your treatment has been planned according to current medical practices, but problems sometimes occur. Call your health care provider if you have any problems or questions after your procedure.  What can I expect after the procedure?  After your procedure, it is common:  · To feel sleepy for several hours.  · To feel clumsy and have poor balance for several hours.  · To have poor judgment for several hours.  · To vomit if you eat too soon.  Follow these instructions at home:  For at least 24 hours after the procedure:    · Do not:  ? Participate in activities where you could fall or become injured.  ? Drive.  ? Use heavy machinery.  ? Drink alcohol.  ? Take sleeping pills or medicines that cause drowsiness.  ? Make important decisions or sign legal documents.  ? Take care of children on your own.  · Rest.  Eating and drinking  · Follow the diet recommended by your health care provider.  · If you vomit:  ? Drink water, juice, or soup when you can drink without vomiting.  ? Make sure you have little or no nausea before eating solid foods.  General instructions  · Have a responsible adult stay with you until you are awake and alert.  · Take over-the-counter and prescription medicines only as told by your health care provider.  · If you smoke, do not smoke without supervision.  · Keep all follow-up visits as told by your health care provider. This is important.  Contact a health care provider if:  · You keep feeling nauseous or you keep vomiting.  · You feel light-headed.  · You develop a rash.  · You have a fever.  Get help right away if:  · You have trouble breathing.  This information is not intended to replace advice given to you by your health care provider. Make sure you discuss any questions you have  with your health care provider.  Document Released: 11/07/2012 Document Revised: 06/22/2015 Document Reviewed: 05/09/2015  Elsevier Interactive Patient Education © 2019 [employer]  Dialysis Fistulogram, Care After  This sheet gives you information about how to care for yourself after your procedure. Your health care provider may also give you more specific instructions. If you have problems or questions, contact your health care provider.  What can I expect after the procedure?  After the procedure, it is common to have:  · A small amount of discomfort in the area where the small, thin tube (catheter) was placed for the procedure.  · A small amount of bruising around the fistula.  · Sleepiness and tiredness (fatigue).  Follow these instructions at home:  Activity    · Rest at home and do not lift anything that is heavier than 5 lb (2.3 kg) on the day after your procedure.  · Return to your normal activities as told by your health care provider. Ask your health care provider what activities are safe for you.  · Do not drive or use heavy machinery while taking prescription pain medicine.  · Do not drive for 24 hours if you were given a medicine to help you relax (sedative) during your procedure.  Medicines    · Take over-the-counter and prescription medicines only as told by your health care   provider.  Puncture site care  · Follow instructions from your health care provider about how to take care of the site where catheters were inserted. Make sure you:  ? Wash your hands with soap and water before you change your bandage (dressing). If soap and water are not available, use hand sanitizer.  ? Change your dressing as told by your health care provider.  ? Leave stitches (sutures), skin glue, or adhesive strips in place. These skin closures may need to stay in place for 2 weeks or longer. If adhesive strip edges start to loosen and curl up, you may trim the loose edges. Do not remove adhesive strips completely unless  your health care provider tells you to do that.  · Check your puncture area every day for signs of infection. Check for:  ? Redness, swelling, or pain.  ? Fluid or blood.  ? Warmth.  ? Pus or a bad smell.  General instructions  · Do not take baths, swim, or use a hot tub until your health care provider approves. Ask your health care provider if you may take showers. You may only be allowed to take sponge baths.  · Monitor your dialysis fistula closely. Check to make sure that you can feel a vibration or buzz (a thrill) when you put your fingers over the fistula.  · Prevent damage to your graft or fistula:  ? Do not wear tight-fitting clothing or jewelry on the arm or leg that has your graft or fistula.  ? Tell all your health care providers that you have a dialysis fistula or graft.  ? Do not allow blood draws, IVs, or blood pressure readings to be done in the arm that has your fistula or graft.  ? Do not allow flu shots or vaccinations in the arm with your fistula or graft.  · Keep all follow-up visits as told by your health care provider. This is important.  Contact a health care provider if:  · You have redness, swelling, or pain at the site where the catheter was put in.  · You have fluid or blood coming from the catheter site.  · The catheter site feels warm to the touch.  · You have pus or a bad smell coming from the catheter site.  · You have a fever or chills.  Get help right away if:  · You feel weak.  · You have trouble balancing.  · You have trouble moving your arms or legs.  · You have problems with your speech or vision.  · You can no longer feel a vibration or buzz when you put your fingers over your dialysis fistula.  · The limb that was used for the procedure:  ? Swells.  ? Is painful.  ? Is cold.  ? Is discolored, such as blue or pale white.  · You have chest pain or shortness of breath.  Summary  · After a dialysis fistulogram, it is common to have a small amount of discomfort or bruising in the  area where the small, thin tube (catheter) was placed.  · Rest at home on the day after your procedure. Return to your normal activities as told by your health care provider.  · Take over-the-counter and prescription medicines only as told by your health care provider.  · Follow instructions from your health care provider about how to take care of the site where the catheter was inserted.  · Keep all follow-up visits as told by your health   care provider.  This information is not intended to replace advice given to you by your health care provider. Make sure you discuss any questions you have with your health care provider.  Document Released: 06/03/2013 Document Revised: 02/17/2017 Document Reviewed: 02/17/2017  Elsevier Interactive Patient Education © 2019 [employer]

## 2018-06-27 NOTE — Anesthesia Post-op Follow-up Note (Signed)
Anesthesia QCDR form completed.        

## 2018-06-27 NOTE — Transfer of Care (Signed)
Immediate Anesthesia Transfer of Care Note  Patient: [name]  Procedure(s) Performed: A/V FISTULAGRAM (Right )  Patient Location: PACU  Anesthesia Type:General  Level of Consciousness: awake, alert  and oriented  Airway & Oxygen Therapy: Patient Spontanous Breathing  Post-op Assessment: Report given to RN and Post -op Vital signs reviewed and stable  Post vital signs: Reviewed and stable  Last Vitals:  Vitals Value Taken Time  BP 112/69 06/27/2018  9:48 AM  Temp    Pulse    Resp 12 06/27/2018  9:49 AM  SpO2    Vitals shown include unvalidated device data.  Last Pain:  Vitals:   06/27/18 0713  TempSrc: Oral  PainSc: 5       Patients Stated Pain Goal: 5 (75/44/92 0100)  Complications: No apparent anesthesia complications

## 2018-07-12 ENCOUNTER — Other Ambulatory Visit (INDEPENDENT_AMBULATORY_CARE_PROVIDER_SITE_OTHER): Payer: Self-pay | Admitting: Vascular Surgery

## 2018-07-12 DIAGNOSIS — N186 End stage renal disease: Secondary | ICD-10-CM

## 2018-07-20 ENCOUNTER — Encounter (INDEPENDENT_AMBULATORY_CARE_PROVIDER_SITE_OTHER): Payer: Self-pay

## 2018-07-20 ENCOUNTER — Ambulatory Visit (INDEPENDENT_AMBULATORY_CARE_PROVIDER_SITE_OTHER): Payer: Medicare Other

## 2018-07-20 ENCOUNTER — Ambulatory Visit (INDEPENDENT_AMBULATORY_CARE_PROVIDER_SITE_OTHER): Payer: Medicare Other | Admitting: Nurse Practitioner

## 2018-07-20 ENCOUNTER — Other Ambulatory Visit: Payer: Self-pay

## 2018-07-20 ENCOUNTER — Encounter (INDEPENDENT_AMBULATORY_CARE_PROVIDER_SITE_OTHER): Payer: Self-pay | Admitting: Nurse Practitioner

## 2018-07-20 VITALS — BP 137/87 | HR 92 | Resp 16 | Wt 250.0 lb

## 2018-07-20 DIAGNOSIS — Z794 Long term (current) use of insulin: Secondary | ICD-10-CM

## 2018-07-20 DIAGNOSIS — N186 End stage renal disease: Secondary | ICD-10-CM | POA: Diagnosis not present

## 2018-07-20 DIAGNOSIS — I10 Essential (primary) hypertension: Secondary | ICD-10-CM

## 2018-07-20 DIAGNOSIS — Z992 Dependence on renal dialysis: Secondary | ICD-10-CM | POA: Diagnosis not present

## 2018-07-20 DIAGNOSIS — F17210 Nicotine dependence, cigarettes, uncomplicated: Secondary | ICD-10-CM

## 2018-07-20 DIAGNOSIS — Z79899 Other long term (current) drug therapy: Secondary | ICD-10-CM

## 2018-07-20 DIAGNOSIS — I120 Hypertensive chronic kidney disease with stage 5 chronic kidney disease or end stage renal disease: Secondary | ICD-10-CM

## 2018-07-20 DIAGNOSIS — E1122 Type 2 diabetes mellitus with diabetic chronic kidney disease: Secondary | ICD-10-CM | POA: Diagnosis not present

## 2018-07-29 ENCOUNTER — Encounter (INDEPENDENT_AMBULATORY_CARE_PROVIDER_SITE_OTHER): Payer: Self-pay | Admitting: Nurse Practitioner

## 2018-07-29 NOTE — Progress Notes (Signed)
SUBJECTIVE:  Patient ID: [name], female    DOB: 16-Feb-1985, 33 y.o.   MRN: [id number] Chief Complaint  Patient presents with  . Follow-up    [facility] 2week vein mapping    HPI  [name] is a 33 y.o. female  That presents for vein mapping.  Currently the patient does not have adequate dialysis access.  The patient denies any issues with her current permcath.  She denies any uremic symptoms.  The patient underwent vein mapping which does not show any available veins for fistula or graft formation.  This is the possibility of HERO insertion or PD cath placement.    Past Medical History:  Diagnosis Date  . Anemia   . Anxiety   . Asthma   . Diabetes mellitus   . Dysrhythmia   . 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 GERD (gastroesophageal reflux disease)   . History of MRSA infection 2002   right foot  . Hypertension   . Irregular heart beat   . Morbid obesity ([place])   . Renal insufficiency   . Seizures ([place])    was on medication for awhile then stopped taking it; no seizure for over 7-8 years (2012?)  . Sleep apnea    no problems since weight loss surgery 06/2015    Past Surgical History:  Procedure Laterality Date  . A/V FISTULAGRAM Right 10/09/2017   Procedure: A/V FISTULAGRAM;  Surgeon: Algernon Huxley, MD;  Location: [place] [facility];  Service: Cardiovascular;  Laterality: Right;  . A/V FISTULAGRAM Right 12/21/2017   Procedure: A/V FISTULAGRAM;  Surgeon: Algernon Huxley, MD;  Location: [place] [facility];  Service: Cardiovascular;  Laterality: Right;  . A/V FISTULAGRAM Right 06/27/2018   Procedure: A/V FISTULAGRAM;  Surgeon: Algernon Huxley, MD;  Location: [place] [facility];  Service: Cardiovascular;  Laterality: Right;  . AV FISTULA PLACEMENT  08/26/10   Left Radiocephalic AVF  . AV FISTULA PLACEMENT  08/26/2015   Procedure: ARTERIOVENOUS (AV) FISTULA CREATION;  Surgeon: Algernon Huxley, MD;  Location: [facility];  Service: Vascular;;  . AV FISTULA  PLACEMENT Right 06/28/2017   Procedure: ARTERIOVENOUS (AV) FISTULA CREATION ( BRACHIAL BASILIC );  Surgeon: Algernon Huxley, MD;  Location: [facility];  Service: Vascular;  Laterality: Right;  . BACK SURGERY     infection in spine (rods, screws, spacers)  . BASCILIC VEIN TRANSPOSITION Right 08/16/2017   Procedure: BASCILIC VEIN TRANSPOSITION (2ND STAGE UPPER);  Surgeon: Algernon Huxley, MD;  Location: [facility];  Service: Vascular;  Laterality: Right;  . CARPAL TUNNEL RELEASE Left 05/26/2017   Procedure: CARPAL TUNNEL RELEASE;  Surgeon: Hessie Knows, MD;  Location: [facility];  Service: Orthopedics;  Laterality: Left;  . CARPAL TUNNEL RELEASE Left 06/09/2017   Procedure: CARPAL TUNNEL RELEASE;  Surgeon: Hessie Knows, MD;  Location: [facility];  Service: Orthopedics;  Laterality: Left;  . CARPAL TUNNEL RELEASE Right 2014  . DIALYSIS/PERMA CATHETER REMOVAL N/A 03/07/2016   Procedure: Dialysis/Perma Catheter Removal;  Surgeon: Algernon Huxley, MD;  Location: [place] [facility];  Service: Cardiovascular;  Laterality: N/A;  . EYE SURGERY     laser photocoagulation  . EYE SURGERY Right 2008   removal of blood clot  . GASTRIC BYPASS  06/2015  . INTRAUTERINE DEVICE (IUD) INSERTION    . METATARSAL HEAD EXCISION Right 03/16/2018   Procedure: OSTECTOMY MET HEAD 2;  Surgeon: Albertine Patricia, DPM;  Location: [facility];  Service: Podiatry;  Laterality: Right;  . PERIPHERAL VASCULAR CATHETERIZATION N/A 07/07/2014   Procedure: A/V Shuntogram/Fistulagram;  Surgeon: Algernon Huxley, MD;  Location: [place] [facility];  Service: Cardiovascular;  Laterality: N/A;  . PERIPHERAL VASCULAR CATHETERIZATION N/A 07/07/2014   Procedure: A/V Shunt Intervention;  Surgeon: Algernon Huxley, MD;  Location: [place] [facility];  Service: Cardiovascular;  Laterality: N/A;  . PERIPHERAL VASCULAR CATHETERIZATION Right 01/12/2015   Procedure: A/V Shuntogram/Fistulagram;  Surgeon: Algernon Huxley, MD;  Location: [place] [facility];  Service: Cardiovascular;   Laterality: Right;  . PERIPHERAL VASCULAR CATHETERIZATION N/A 01/12/2015   Procedure: A/V Shunt Intervention;  Surgeon: Algernon Huxley, MD;  Location: [place] [facility];  Service: Cardiovascular;  Laterality: N/A;  . PERIPHERAL VASCULAR CATHETERIZATION Right 06/08/2015   Procedure: A/V Shuntogram/Fistulagram;  Surgeon: Algernon Huxley, MD;  Location: [place] [facility];  Service: Cardiovascular;  Laterality: Right;  . PERIPHERAL VASCULAR CATHETERIZATION N/A 06/08/2015   Procedure: A/V Shunt Intervention;  Surgeon: Algernon Huxley, MD;  Location: [place] [facility];  Service: Cardiovascular;  Laterality: N/A;  . PERIPHERAL VASCULAR CATHETERIZATION Right 07/14/2015   Procedure: Thrombectomy;  Surgeon: Katha Cabal, MD;  Location: [place] [facility];  Service: Cardiovascular;  Laterality: Right;  . PERIPHERAL VASCULAR CATHETERIZATION N/A 07/14/2015   Procedure: A/V Shuntogram/Fistulagram;  Surgeon: Katha Cabal, MD;  Location: [place] [facility];  Service: Cardiovascular;  Laterality: N/A;  . PERIPHERAL VASCULAR CATHETERIZATION N/A 07/17/2015   Procedure: Thrombectomy;  Surgeon: Katha Cabal, MD;  Location: [place] [facility];  Service: Cardiovascular;  Laterality: N/A;  . PERIPHERAL VASCULAR CATHETERIZATION N/A 07/17/2015   Procedure: Dialysis/Perma Catheter Insertion;  Surgeon: Katha Cabal, MD;  Location: [place] [facility];  Service: Cardiovascular;  Laterality: N/A;  . PERIPHERAL VASCULAR CATHETERIZATION N/A 08/05/2015   Procedure: Thrombectomy;  Surgeon: Katha Cabal, MD;  Location: [place] [facility];  Service: Cardiovascular;  Laterality: N/A;  declot of graft  . PERIPHERAL VASCULAR THROMBECTOMY N/A 05/10/2017   Procedure: PERIPHERAL VASCULAR THROMBECTOMY;  Surgeon: Algernon Huxley, MD;  Location: [place] [facility];  Service: Cardiovascular;  Laterality: N/A;  . TOE AMPUTATION     both big toes removed    Social History   Socioeconomic History  . Marital status:  Single    Spouse name: Not on file  . Number of children: Not on file  . Years of education: Not on file  . Highest education level: Not on file  Occupational History  . Not on file  Social Needs  . Financial resource strain: Not very hard  . Food insecurity    Worry: Not on file    Inability: Not on file  . Transportation needs    Medical: No    Non-medical: No  Tobacco Use  . Smoking status: Light Tobacco Smoker    Packs/day: 0.25    Years: 10.00    Pack years: 2.50    Types: Cigarettes  . Smokeless tobacco: Never Used  Substance and Sexual Activity  . Alcohol use: No  . Drug use: No  . Sexual activity: Yes  Lifestyle  . Physical activity    Days per week: Not on file    Minutes per session: Not on file  . Stress: Not on file  Relationships  . Social Herbalist on phone: Three times a week    Gets together: Not on file    Attends religious service: Not on file  Active member of club or organization: Not on file    Attends meetings of clubs or organizations: Not on file    Relationship status: Not on file  . Intimate partner violence    Fear of current or ex partner: No    Emotionally abused: Not on file    Physically abused: Not on file    Forced sexual activity: Not on file  Other Topics Concern  . Not on file  Social History Narrative  . Not on file    Family History  Problem Relation Age of Onset  . Diabetes Mother   . Heart attack Mother   . Stroke Mother   . High Cholesterol Mother   . Diabetes Father     Allergies  Allergen Reactions  . Marcillin [Ampicillin] Rash and Other (See Comments)    Breathing and heart palp  Hot and sweating Did it involve swelling of the face/tongue/throat, SOB, or low BP? Yes Did it involve sudden or severe rash/hives, skin peeling, or any reaction on the inside of your mouth or nose? Yes Did you need to seek medical attention at a hospital or doctor's office? No When did it last happen?2 years   If all above answers are "NO", may proceed with cephalosporin use.   [name] Kitchen Penicillins Other (See Comments)    Heart races,sweats    . Latex Swelling and Rash  . Vancomycin Itching, Nausea Only and Rash    Thick pieces of skin pulls off.     Review of Systems   Review of Systems: Negative Unless Checked Constitutional: [] Weight loss  [] Fever  [] Chills Cardiac: [] Chest pain   []  Atrial Fibrillation  [] Palpitations   [] Shortness of breath when laying flat   [] Shortness of breath with exertion. [] Shortness of breath at rest Vascular:  [] Pain in legs with walking   [] Pain in legs with standing [] Pain in legs when laying flat   [] Claudication    [] Pain in feet when laying flat    [] History of DVT   [] Phlebitis   [] Swelling in legs   [] Varicose veins   [] Non-healing ulcers Pulmonary:   [] Uses home oxygen   [] Productive cough   [] Hemoptysis   [] Wheeze  [] COPD   [x] Asthma Neurologic:  [] Dizziness   [] Seizures  [] Blackouts [] History of stroke   [] History of TIA  [] Aphasia   [] Temporary Blindness   [] Weakness or numbness in arm   [] Weakness or numbness in leg Musculoskeletal:   [] Joint swelling   [] Joint pain   [] Low back pain  []  History of Knee Replacement [] Arthritis [] back Surgeries  []  Spinal Stenosis    Hematologic:  [] Easy bruising  [] Easy bleeding   [] Hypercoagulable state   [x] Anemic Gastrointestinal:  [] Diarrhea   [] Vomiting  [x] Gastroesophageal reflux/heartburn   [] Difficulty swallowing. [] Abdominal pain Genitourinary:  [x] Chronic kidney disease   [] Difficult urination  [] Anuric   [] Blood in urine [] Frequent urination  [] Burning with urination   [] Hematuria Skin:  [] Rashes   [] Ulcers [] Wounds Psychological:  [] History of anxiety   []  History of major depression  []  Memory Difficulties      OBJECTIVE:   Physical Exam  BP 137/87 (BP Location: Right Arm)   Pulse 92   Resp 16   Wt 250 lb (113.4 kg)   BMI 42.91 kg/m   Gen: WD/WN, NAD Head: Belk/AT, No temporalis wasting.   Ear/Nose/Throat: Hearing grossly intact, nares w/o erythema or drainage Eyes: PER, EOMI, sclera nonicteric.  Neck: Supple, no masses.  No JVD.  Pulmonary:  Good  air movement, no use of accessory muscles.  Cardiac: RRR Vascular:  Vessel Right Left  Radial Palpable Palpable   Gastrointestinal: soft, non-distended. No guarding/no peritoneal signs.  Musculoskeletal: M/S 5/5 throughout.  No deformity or atrophy.  Neurologic: Pain and light touch intact in extremities.  Symmetrical.  Speech is fluent. Motor exam as listed above. Psychiatric: Judgment intact, Mood & affect appropriate for pt's clinical situation. Dermatologic: No Venous rashes. No Ulcers Noted.  No changes consistent with cellulitis. Lymph : No Cervical lymphadenopathy, no lichenification or skin changes of chronic lymphedema.       ASSESSMENT AND PLAN:  1. End stage renal disease ([place])  Recommend:  The patient has advanced renal disease but currently does not have dialysis access.  The patient has elected to move forward with peritoneal dialysis. Although there is a history of prior operations, this does not appear at this time to be a prohibitive situation.  This was also discussed.  Risk and benefits were reviewed the patient.  Indications for the procedure were reviewed.  All questions were answered, the patient wishes to have her home evaluation first to determine her suitability for home PD.  She will contact our office once she has made a decision.   Alternatively, we also spoke about the possibility of placing a hero graft at the site of her permcath, with permcath replacement.  However, the patient was not amenable to this plan at this time.   2. Type 2 diabetes mellitus with chronic kidney disease on chronic dialysis, unspecified whether long term insulin use ([place]) Continue hypoglycemic medications as already ordered, these medications have been reviewed and there are no changes at this time.  Hgb A1C to be  monitored as already arranged by primary service   3. Essential hypertension, benign Continue antihypertensive medications as already ordered, these medications have been reviewed and there are no changes at this time.    Current Outpatient Medications on File Prior to Visit  Medication Sig Dispense Refill  . aspirin 81 MG tablet Take 81 mg by mouth every evening.     . Camphor-Eucalyptus-Menthol (VICKS VAPORUB EX) Apply 1 application topically daily as needed (congestion).    [name] Kitchen docusate sodium (COLACE) 100 MG capsule Take 100 mg by mouth daily as needed for mild constipation.     . fentaNYL (DURAGESIC - DOSED MCG/HR) 12 MCG/HR Place 12 mcg onto the skin every 3 (three) days.     [name] Kitchen FOSRENOL 1000 MG PACK Take 1 packet by mouth See admin instructions. Take 1 packet with each meal and with large snacks  6  . HYDROcodone-acetaminophen (NORCO) 5-325 MG tablet Take 1 tablet by mouth every 6 (six) hours as needed for moderate pain. 21 tablet 0  . insulin glargine (LANTUS) 100 UNIT/ML injection Inject 6 Units into the skin daily as needed (high blood sugar). If blood sugar is over 120 for several days in a row    . LORazepam (ATIVAN) 2 MG/ML injection Inject into the vein.    . midodrine (PROAMATINE) 10 MG tablet Take 10 mg by mouth 2 (two) times daily.    . naloxone (NARCAN) nasal spray 4 mg/0.1 mL Place 1 spray into the nose daily as needed (opioid overdose).    [name] Kitchen omeprazole (PRILOSEC) 20 MG capsule Take 20 mg by mouth daily as needed (acid reflux).     [name] Kitchen oxyCODONE-acetaminophen (ROXICET) 5-325 MG tablet Take 1-2 tablets by mouth every 4 (four) hours as needed. 30 tablet 0  . PEDIATRIC MULTIPLE VITAMINS  PO Take 1 tablet by mouth every evening. [place]    . pravastatin (PRAVACHOL) 40 MG tablet Take 40 mg by mouth at bedtime.     . promethazine (PHENERGAN) 12.5 MG tablet Take 12.5 mg by mouth every 6 (six) hours as needed for nausea or vomiting.    . Skin Protectants, Misc. (DERMACERIN) CREA  Apply 1 application topically daily as needed (itching / dry skin).     [name] Kitchen albuterol (PROVENTIL HFA;VENTOLIN HFA) 108 (90 Base) MCG/ACT inhaler Inhale 2 puffs into the lungs 2 (two) times daily as needed for wheezing or shortness of breath.      No current facility-administered medications on file prior to visit.     There are no Patient Instructions on file for this visit. No follow-ups on file.   Kris Hartmann, NP  This note was completed with Sales executive.  Any errors are purely unintentional.

## 2018-08-21 ENCOUNTER — Encounter (INDEPENDENT_AMBULATORY_CARE_PROVIDER_SITE_OTHER): Payer: Medicare Other

## 2018-08-21 ENCOUNTER — Ambulatory Visit (INDEPENDENT_AMBULATORY_CARE_PROVIDER_SITE_OTHER): Payer: Medicare Other | Admitting: Vascular Surgery

## 2018-08-29 DIAGNOSIS — D849 Immunodeficiency, unspecified: Secondary | ICD-10-CM | POA: Diagnosis present

## 2019-01-14 ENCOUNTER — Emergency Department: Payer: Medicare Other

## 2019-01-14 ENCOUNTER — Other Ambulatory Visit: Payer: Self-pay

## 2019-01-14 ENCOUNTER — Observation Stay
Admission: EM | Admit: 2019-01-14 | Discharge: 2019-01-15 | Disposition: A | Discharge status: Home / Self Care | Payer: Medicare Other | Attending: Internal Medicine | Admitting: Internal Medicine

## 2019-01-14 DIAGNOSIS — Z87440 Personal history of urinary (tract) infections: Secondary | ICD-10-CM | POA: Insufficient documentation

## 2019-01-14 DIAGNOSIS — B961 Klebsiella pneumoniae [K. pneumoniae] as the cause of diseases classified elsewhere: Secondary | ICD-10-CM | POA: Diagnosis not present

## 2019-01-14 DIAGNOSIS — Z20828 Contact with and (suspected) exposure to other viral communicable diseases: Secondary | ICD-10-CM | POA: Diagnosis not present

## 2019-01-14 DIAGNOSIS — I10 Essential (primary) hypertension: Secondary | ICD-10-CM | POA: Diagnosis present

## 2019-01-14 DIAGNOSIS — F17210 Nicotine dependence, cigarettes, uncomplicated: Secondary | ICD-10-CM | POA: Diagnosis not present

## 2019-01-14 DIAGNOSIS — R1907 Generalized intra-abdominal and pelvic swelling, mass and lump: Secondary | ICD-10-CM | POA: Diagnosis not present

## 2019-01-14 DIAGNOSIS — Z6841 Body Mass Index (BMI) 40.0 and over, adult: Secondary | ICD-10-CM | POA: Diagnosis not present

## 2019-01-14 DIAGNOSIS — R109 Unspecified abdominal pain: Secondary | ICD-10-CM

## 2019-01-14 DIAGNOSIS — E785 Hyperlipidemia, unspecified: Secondary | ICD-10-CM | POA: Diagnosis not present

## 2019-01-14 DIAGNOSIS — N136 Pyonephrosis: Secondary | ICD-10-CM | POA: Diagnosis not present

## 2019-01-14 DIAGNOSIS — Z7982 Long term (current) use of aspirin: Secondary | ICD-10-CM | POA: Insufficient documentation

## 2019-01-14 DIAGNOSIS — N12 Tubulo-interstitial nephritis, not specified as acute or chronic: Secondary | ICD-10-CM

## 2019-01-14 DIAGNOSIS — D849 Immunodeficiency, unspecified: Secondary | ICD-10-CM

## 2019-01-14 DIAGNOSIS — Z79899 Other long term (current) drug therapy: Secondary | ICD-10-CM | POA: Diagnosis not present

## 2019-01-14 DIAGNOSIS — Z940 Kidney transplant status: Secondary | ICD-10-CM

## 2019-01-14 DIAGNOSIS — IMO0002 Reserved for concepts with insufficient information to code with codable children: Secondary | ICD-10-CM | POA: Diagnosis present

## 2019-01-14 DIAGNOSIS — M549 Dorsalgia, unspecified: Secondary | ICD-10-CM | POA: Insufficient documentation

## 2019-01-14 DIAGNOSIS — Z7952 Long term (current) use of systemic steroids: Secondary | ICD-10-CM | POA: Diagnosis not present

## 2019-01-14 DIAGNOSIS — D649 Anemia, unspecified: Secondary | ICD-10-CM | POA: Diagnosis not present

## 2019-01-14 DIAGNOSIS — E1065 Type 1 diabetes mellitus with hyperglycemia: Secondary | ICD-10-CM | POA: Diagnosis not present

## 2019-01-14 DIAGNOSIS — J4520 Mild intermittent asthma, uncomplicated: Secondary | ICD-10-CM | POA: Diagnosis not present

## 2019-01-14 DIAGNOSIS — F419 Anxiety disorder, unspecified: Secondary | ICD-10-CM | POA: Diagnosis not present

## 2019-01-14 DIAGNOSIS — Z89411 Acquired absence of right great toe: Secondary | ICD-10-CM | POA: Diagnosis not present

## 2019-01-14 DIAGNOSIS — K219 Gastro-esophageal reflux disease without esophagitis: Secondary | ICD-10-CM | POA: Insufficient documentation

## 2019-01-14 DIAGNOSIS — Z9884 Bariatric surgery status: Secondary | ICD-10-CM | POA: Insufficient documentation

## 2019-01-14 DIAGNOSIS — G8929 Other chronic pain: Secondary | ICD-10-CM | POA: Insufficient documentation

## 2019-01-14 DIAGNOSIS — E1069 Type 1 diabetes mellitus with other specified complication: Secondary | ICD-10-CM | POA: Diagnosis not present

## 2019-01-14 DIAGNOSIS — Z880 Allergy status to penicillin: Secondary | ICD-10-CM | POA: Insufficient documentation

## 2019-01-14 DIAGNOSIS — Z794 Long term (current) use of insulin: Secondary | ICD-10-CM | POA: Diagnosis not present

## 2019-01-14 DIAGNOSIS — E1169 Type 2 diabetes mellitus with other specified complication: Secondary | ICD-10-CM | POA: Diagnosis present

## 2019-01-14 DIAGNOSIS — Z89412 Acquired absence of left great toe: Secondary | ICD-10-CM | POA: Diagnosis not present

## 2019-01-14 LAB — CBC
HCT: 43.8 % (ref 36.0–46.0)
Hemoglobin: 14.4 g/dL (ref 12.0–15.0)
MCH: 25.5 pg — ABNORMAL LOW (ref 26.0–34.0)
MCHC: 32.9 g/dL (ref 30.0–36.0)
MCV: 77.5 fL — ABNORMAL LOW (ref 80.0–100.0)
Platelets: 248 10*3/uL (ref 150–400)
RBC: 5.65 MIL/uL — ABNORMAL HIGH (ref 3.87–5.11)
RDW: 13 % (ref 11.5–15.5)
WBC: 13.6 10*3/uL — ABNORMAL HIGH (ref 4.0–10.5)
nRBC: 0 % (ref 0.0–0.2)

## 2019-01-14 LAB — URINALYSIS, ROUTINE W REFLEX MICROSCOPIC
Bilirubin Urine: NEGATIVE
Glucose, UA: >=500 mg/dL — AB
Ketones, ur: NEGATIVE mg/dL
Nitrite: NEGATIVE
Protein, ur: 100 mg/dL — AB
RBC / HPF: >50 RBC/hpf — ABNORMAL HIGH (ref 0–5)
Specific Gravity, Urine: 1.025 (ref 1.005–1.030)
Squamous Epithelial / HPF: NONE SEEN (ref 0–5)
WBC, UA: >50 WBC/hpf — ABNORMAL HIGH (ref 0–5)
pH: 7 (ref 5.0–8.0)

## 2019-01-14 LAB — COMPREHENSIVE METABOLIC PANEL
ALT: 8 U/L (ref 0–44)
AST: 12 U/L — ABNORMAL LOW (ref 15–41)
Albumin: 4 g/dL (ref 3.5–5.0)
Alkaline Phosphatase: 125 U/L (ref 38–126)
Anion gap: 7 (ref 5–15)
BUN: 21 mg/dL — ABNORMAL HIGH (ref 6–20)
CO2: 25 mmol/L (ref 22–32)
Calcium: 10.1 mg/dL (ref 8.9–10.3)
Chloride: 106 mmol/L (ref 98–111)
Creatinine, Ser: 1.43 mg/dL — ABNORMAL HIGH (ref 0.44–1.00)
GFR calc Af Amer: 55 mL/min — ABNORMAL LOW (ref >60)
GFR calc non Af Amer: 47 mL/min — ABNORMAL LOW (ref >60)
Glucose, Bld: 371 mg/dL — ABNORMAL HIGH (ref 70–99)
Potassium: 4.2 mmol/L (ref 3.5–5.1)
Sodium: 138 mmol/L (ref 135–145)
Total Bilirubin: 0.8 mg/dL (ref 0.3–1.2)
Total Protein: 7.9 g/dL (ref 6.5–8.1)

## 2019-01-14 LAB — POCT PREGNANCY, URINE: Preg Test, Ur: NEGATIVE

## 2019-01-14 LAB — GLUCOSE, CAPILLARY
Glucose-Capillary: 348 mg/dL — ABNORMAL HIGH (ref 70–99)
Glucose-Capillary: 318 mg/dL — ABNORMAL HIGH (ref 70–99)

## 2019-01-14 MED ORDER — TACROLIMUS 1 MG PO CAPS
5.0000 mg | ORAL_CAPSULE | Freq: Every day | ORAL | Status: DC
  Administered 2019-01-14 – 2019-01-15 (×2): 5 mg via ORAL
  Filled 2019-01-14 (×2): qty 5

## 2019-01-14 MED ORDER — SULFAMETHOXAZOLE-TRIMETHOPRIM 400-80 MG PO TABS: 1.0000 | ORAL_TABLET | ORAL | Status: DC

## 2019-01-14 MED ORDER — HYDROCHLOROTHIAZIDE 12.5 MG PO CAPS: 12.5000 mg | ORAL_CAPSULE | ORAL | Status: DC

## 2019-01-14 MED ORDER — INSULIN ASPART 100 UNIT/ML ~~LOC~~ SOLN
0.0000 [IU] | Freq: Three times a day (TID) | SUBCUTANEOUS | Status: DC
  Administered 2019-01-15: 2 [IU] via SUBCUTANEOUS
  Filled 2019-01-14: qty 1

## 2019-01-14 MED ORDER — PANTOPRAZOLE SODIUM 40 MG PO TBEC
40.0000 mg | DELAYED_RELEASE_TABLET | Freq: Every day | ORAL | Status: DC
  Administered 2019-01-15: 40 mg via ORAL
  Filled 2019-01-14: qty 1

## 2019-01-14 MED ORDER — PREDNISONE 10 MG PO TABS
5.0000 mg | ORAL_TABLET | Freq: Every day | ORAL | Status: DC
  Administered 2019-01-15: 5 mg via ORAL
  Filled 2019-01-14: qty 1

## 2019-01-14 MED ORDER — OXYCODONE-ACETAMINOPHEN 5-325 MG PO TABS
1.0000 | ORAL_TABLET | Freq: Once | ORAL | Status: AC
  Administered 2019-01-14: 1 via ORAL
  Filled 2019-01-14: qty 1

## 2019-01-14 MED ORDER — SODIUM CHLORIDE 0.9 % IV SOLN
1.0000 g | Freq: Once | INTRAVENOUS | Status: AC
  Administered 2019-01-14: 1 g via INTRAVENOUS
  Filled 2019-01-14: qty 10

## 2019-01-14 MED ORDER — SODIUM CHLORIDE 0.9 % IV SOLN: 1.0000 g | Freq: Once | INTRAVENOUS | Status: DC

## 2019-01-14 MED ORDER — INSULIN GLARGINE 100 UNIT/ML ~~LOC~~ SOLN
32.0000 [IU] | Freq: Every day | SUBCUTANEOUS | Status: DC
  Administered 2019-01-14: 32 [IU] via SUBCUTANEOUS
  Filled 2019-01-14 (×2): qty 0.32

## 2019-01-14 MED ORDER — FENTANYL CITRATE (PF) 100 MCG/2ML IJ SOLN
50.0000 ug | Freq: Once | INTRAMUSCULAR | Status: AC
  Administered 2019-01-14: 50 ug via INTRAVENOUS
  Filled 2019-01-14: qty 2

## 2019-01-14 MED ORDER — SODIUM CHLORIDE 0.9 % IV SOLN
1.0000 g | INTRAVENOUS | Status: DC
  Filled 2019-01-14: qty 10

## 2019-01-14 MED ORDER — MORPHINE SULFATE (PF) 2 MG/ML IV SOLN: 2.0000 mg | INTRAVENOUS | Status: DC | PRN

## 2019-01-14 MED ORDER — INSULIN ASPART 100 UNIT/ML ~~LOC~~ SOLN
0.0000 [IU] | Freq: Three times a day (TID) | SUBCUTANEOUS | Status: DC
  Filled 2019-01-14: qty 0.09

## 2019-01-14 MED ORDER — SODIUM CHLORIDE 0.9 % IV SOLN
INTRAVENOUS | Status: DC
  Administered 2019-01-14: 22:00:00 via INTRAVENOUS

## 2019-01-14 MED ORDER — ENOXAPARIN SODIUM 40 MG/0.4ML ~~LOC~~ SOLN: 40.0000 mg | SUBCUTANEOUS | Status: DC

## 2019-01-14 MED ORDER — OXYCODONE-ACETAMINOPHEN 5-325 MG PO TABS: 1.0000 | ORAL_TABLET | Freq: Two times a day (BID) | ORAL | Status: DC | PRN

## 2019-01-14 MED ORDER — INSULIN ASPART 100 UNIT/ML ~~LOC~~ SOLN
0.0000 [IU] | Freq: Every day | SUBCUTANEOUS | Status: DC
  Administered 2019-01-14: 4 [IU] via SUBCUTANEOUS
  Filled 2019-01-14: qty 1

## 2019-01-14 MED ORDER — K PHOS MONO-SOD PHOS DI & MONO 155-852-130 MG PO TABS
1.0000 | ORAL_TABLET | Freq: Two times a day (BID) | ORAL | Status: DC
  Filled 2019-01-14 (×3): qty 1

## 2019-01-14 MED ORDER — MYCOPHENOLATE SODIUM 180 MG PO TBEC
360.0000 mg | DELAYED_RELEASE_TABLET | Freq: Two times a day (BID) | ORAL | Status: DC
  Administered 2019-01-14 – 2019-01-15 (×2): 360 mg via ORAL
  Filled 2019-01-14 (×3): qty 2

## 2019-01-14 NOTE — ED Triage Notes (Signed)
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 Pt assessed by Manuela Schwartz PA and verbal given for pain med.

## 2019-01-14 NOTE — ED Notes (Signed)
Charting error during triage. Triage started under tech log in. This RN completed triage.

## 2019-01-14 NOTE — ED Notes (Signed)
ED TO INPATIENT HANDOFF REPORT  ED Nurse Name and Phone #: [phone]    S Name/Age/Gender Asa Lente 33 y.o. female Room/Bed: ED01HA/ED01HA  Code Status   Code Status: Prior  Home/SNF/Other Home A/Ox4 Is this baseline? Yes    Triage Complete: Triage complete  Chief Complaint Pyelonephritis [N12]  Triage Note Pt to ED with c/o right flank pain. Pt had a kidney transplant on that side in July. Pt woke with sudden onset pain and now states blood in urine.   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 Pt assessed by Manuela Schwartz PA and verbal given for pain med.     Allergies Allergies  Allergen Reactions  . Marcillin [Ampicillin] Rash and Other (See Comments)    Breathing and heart palp  Hot and sweating Did it involve swelling of the face/tongue/throat, SOB, or low BP? Yes Did it involve sudden or severe rash/hives, skin peeling, or any reaction on the inside of your mouth or nose? Yes Did you need to seek medical attention at a hospital or doctor's office? No When did it last happen?2 years  If all above answers are "NO", may proceed with cephalosporin use.   [name] Kitchen Penicillins Other (See Comments)    Heart races,sweats    . Latex Swelling and Rash  . Vancomycin Itching, Nausea Only and Rash    Thick pieces of skin pulls off.    Level of Care/Admitting Diagnosis ED Disposition    ED Disposition Condition [facility] [place] [100120]  Level of Care: Med-Surg [16]  Covid Evaluation: Asymptomatic Screening Protocol (No Symptoms)  Diagnosis: Pyelonephritis [474259]  Admitting Physician: Nicolette Bang [[id number]]  Attending Physician: Nicolette Bang [[id number]]  PT Class (Do Not Modify): Observation [104]  PT Acc Code (Do Not Modify): Observation [10022]       B Medical/Surgery History Past Medical History:  Diagnosis Date  . Anemia   . Anxiety   .  Asthma   . Diabetes mellitus   . Dysrhythmia   . 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 GERD (gastroesophageal reflux disease)   . History of MRSA infection 2002   right foot  . Hypertension   . Irregular heart beat   . Morbid obesity ([place])   . Renal insufficiency   . Seizures ([place])    was on medication for awhile then stopped taking it; no seizure for over 7-8 years (2012?)  . Sleep apnea    no problems since weight loss surgery 06/2015   Past Surgical History:  Procedure Laterality Date  . A/V FISTULAGRAM Right 10/09/2017   Procedure: A/V FISTULAGRAM;  Surgeon: Algernon Huxley, MD;  Location: [place] [facility];  Service: Cardiovascular;  Laterality: Right;  . A/V FISTULAGRAM Right 12/21/2017   Procedure: A/V FISTULAGRAM;  Surgeon: Algernon Huxley, MD;  Location: [place] [facility];  Service: Cardiovascular;  Laterality: Right;  . A/V FISTULAGRAM Right 06/27/2018   Procedure: A/V FISTULAGRAM;  Surgeon: Algernon Huxley, MD;  Location: [place] [facility];  Service: Cardiovascular;  Laterality: Right;  . AV FISTULA PLACEMENT  08/26/10   Left Radiocephalic AVF  . AV FISTULA PLACEMENT  08/26/2015   Procedure: ARTERIOVENOUS (AV) FISTULA CREATION;  Surgeon: Algernon Huxley, MD;  Location: [facility];  Service: Vascular;;  . AV FISTULA PLACEMENT Right 06/28/2017   Procedure: ARTERIOVENOUS (AV) FISTULA CREATION ( BRACHIAL BASILIC );  Surgeon: Lucky Cowboy,  Erskine Squibb, MD;  Location: [facility];  Service: Vascular;  Laterality: Right;  . BACK SURGERY     infection in spine (rods, screws, spacers)  . BASCILIC VEIN TRANSPOSITION Right 08/16/2017   Procedure: BASCILIC VEIN TRANSPOSITION (2ND STAGE UPPER);  Surgeon: Algernon Huxley, MD;  Location: [facility];  Service: Vascular;  Laterality: Right;  . CARPAL TUNNEL RELEASE Left 05/26/2017   Procedure: CARPAL TUNNEL RELEASE;  Surgeon: Hessie Knows, MD;  Location: [facility];  Service: Orthopedics;  Laterality: Left;  . CARPAL TUNNEL RELEASE Left 06/09/2017   Procedure:  CARPAL TUNNEL RELEASE;  Surgeon: Hessie Knows, MD;  Location: [facility];  Service: Orthopedics;  Laterality: Left;  . CARPAL TUNNEL RELEASE Right 2014  . DIALYSIS/PERMA CATHETER REMOVAL N/A 03/07/2016   Procedure: Dialysis/Perma Catheter Removal;  Surgeon: Algernon Huxley, MD;  Location: [place] [facility];  Service: Cardiovascular;  Laterality: N/A;  . EYE SURGERY     laser photocoagulation  . EYE SURGERY Right 2008   removal of blood clot  . GASTRIC BYPASS  06/2015  . INTRAUTERINE DEVICE (IUD) INSERTION    . METATARSAL HEAD EXCISION Right 03/16/2018   Procedure: OSTECTOMY MET HEAD 2;  Surgeon: Albertine Patricia, DPM;  Location: [facility];  Service: Podiatry;  Laterality: Right;  . PERIPHERAL VASCULAR CATHETERIZATION N/A 07/07/2014   Procedure: A/V Shuntogram/Fistulagram;  Surgeon: Algernon Huxley, MD;  Location: [place] [facility];  Service: Cardiovascular;  Laterality: N/A;  . PERIPHERAL VASCULAR CATHETERIZATION N/A 07/07/2014   Procedure: A/V Shunt Intervention;  Surgeon: Algernon Huxley, MD;  Location: [place] [facility];  Service: Cardiovascular;  Laterality: N/A;  . PERIPHERAL VASCULAR CATHETERIZATION Right 01/12/2015   Procedure: A/V Shuntogram/Fistulagram;  Surgeon: Algernon Huxley, MD;  Location: [place] [facility];  Service: Cardiovascular;  Laterality: Right;  . PERIPHERAL VASCULAR CATHETERIZATION N/A 01/12/2015   Procedure: A/V Shunt Intervention;  Surgeon: Algernon Huxley, MD;  Location: [place] [facility];  Service: Cardiovascular;  Laterality: N/A;  . PERIPHERAL VASCULAR CATHETERIZATION Right 06/08/2015   Procedure: A/V Shuntogram/Fistulagram;  Surgeon: Algernon Huxley, MD;  Location: [place] [facility];  Service: Cardiovascular;  Laterality: Right;  . PERIPHERAL VASCULAR CATHETERIZATION N/A 06/08/2015   Procedure: A/V Shunt Intervention;  Surgeon: Algernon Huxley, MD;  Location: [place] [facility];  Service: Cardiovascular;  Laterality: N/A;  . PERIPHERAL VASCULAR CATHETERIZATION Right 07/14/2015    Procedure: Thrombectomy;  Surgeon: Katha Cabal, MD;  Location: [place] [facility];  Service: Cardiovascular;  Laterality: Right;  . PERIPHERAL VASCULAR CATHETERIZATION N/A 07/14/2015   Procedure: A/V Shuntogram/Fistulagram;  Surgeon: Katha Cabal, MD;  Location: [place] [facility];  Service: Cardiovascular;  Laterality: N/A;  . PERIPHERAL VASCULAR CATHETERIZATION N/A 07/17/2015   Procedure: Thrombectomy;  Surgeon: Katha Cabal, MD;  Location: [place] [facility];  Service: Cardiovascular;  Laterality: N/A;  . PERIPHERAL VASCULAR CATHETERIZATION N/A 07/17/2015   Procedure: Dialysis/Perma Catheter Insertion;  Surgeon: Katha Cabal, MD;  Location: [place] [facility];  Service: Cardiovascular;  Laterality: N/A;  . PERIPHERAL VASCULAR CATHETERIZATION N/A 08/05/2015   Procedure: Thrombectomy;  Surgeon: Katha Cabal, MD;  Location: [place] [facility];  Service: Cardiovascular;  Laterality: N/A;  declot of graft  . PERIPHERAL VASCULAR THROMBECTOMY N/A 05/10/2017   Procedure: PERIPHERAL VASCULAR THROMBECTOMY;  Surgeon: Algernon Huxley, MD;  Location: [place] [facility];  Service: Cardiovascular;  Laterality: N/A;  . TOE AMPUTATION     both big toes removed  A IV Location/Drains/Wounds Patient Lines/Drains/Airways Status   Active Line/Drains/Airways    Name:   Placement date:   Placement time:   Site:   Days:   Peripheral IV 01/14/19 Left Antecubital   01/14/19    1822    Antecubital   less than 1   Fistula / Graft Right Upper arm Arteriovenous fistula   --    --    Upper arm      Fistula / Graft Left Forearm Arteriovenous fistula   08/26/15    1214    Forearm   1237   Fistula / Graft Right Other (Comment) Arteriovenous fistula   06/28/17    1600    Other (Comment)   565   Fistula / Graft Right Upper arm Arteriovenous fistula   --    --    Upper arm      Hemodialysis Catheter Right   08/05/15    1015    --   1258   Hemodialysis Catheter   12/12/16    1140    --   763    Hemodialysis Catheter Right Subclavian Double-lumen   12/13/16    0650    Subclavian   762   Post Cath / Sheath 01/12/15 Right Venous   01/12/15    0832    Venous   1463   Post Cath / Sheath 06/08/15 Right    06/08/15    0828    --   1316   Post Cath / Sheath 07/14/15 Right    07/14/15    1402    --   1280   Post Cath / Sheath 07/14/15 Right    07/14/15    1535    --   1280   Sheath 12/21/17 Right Venous;Brachial   12/21/17    1413    Venous;Brachial   389   Sheath 06/27/18 Right Venous   06/27/18    0927    Venous   201   Airway   06/27/18    0846     201   Incision (Closed) 08/26/15 Arm Left   08/26/15    1150     1237   Incision (Closed) 06/09/17 Wrist Left   06/09/17    1241     584   Incision (Closed) 06/28/17 Arm Right   06/28/17    1452     565   Incision (Closed) 08/16/17 Arm Right   08/16/17    1001     516   Incision (Closed) 03/16/18 Foot Right   03/16/18    1107     304          Intake/Output Last 24 hours No intake or output data in the 24 hours ending 01/14/19 2009  Labs/Imaging Results for orders placed or performed during the hospital encounter of 01/14/19 (from the past 48 hour(s))  Comprehensive metabolic panel     Status: Abnormal   Collection Time: 01/14/19  1:38 PM  Result Value Ref Range   Sodium 138 135 - 145 mmol/L   Potassium 4.2 3.5 - 5.1 mmol/L   Chloride 106 98 - 111 mmol/L   CO2 25 22 - 32 mmol/L   Glucose, Bld 371 (H) 70 - 99 mg/dL   BUN 21 (H) 6 - 20 mg/dL   Creatinine, Ser 1.43 (H) 0.44 - 1.00 mg/dL   Calcium 10.1 8.9 - 10.3 mg/dL   Total Protein 7.9 6.5 - 8.1 g/dL   Albumin 4.0 3.5 -  5.0 g/dL   AST 12 (L) 15 - 41 U/L   ALT 8 0 - 44 U/L   Alkaline Phosphatase 125 38 - 126 U/L   Total Bilirubin 0.8 0.3 - 1.2 mg/dL   GFR calc non Af Amer 47 (L) >60 mL/min   GFR calc Af Amer 55 (L) >60 mL/min   Anion gap 7 5 - 15    Comment: Performed at [facility], [address] [place], [place] [address]  CBC     Status: Abnormal   Collection  Time: 01/14/19  1:38 PM  Result Value Ref Range   WBC 13.6 (H) 4.0 - 10.5 K/uL   RBC 5.65 (H) 3.87 - 5.11 MIL/uL   Hemoglobin 14.4 12.0 - 15.0 g/dL   HCT 43.8 36.0 - 46.0 %   MCV 77.5 (L) 80.0 - 100.0 fL   MCH 25.5 (L) 26.0 - 34.0 pg   MCHC 32.9 30.0 - 36.0 g/dL   RDW 13.0 11.5 - 15.5 %   Platelets 248 150 - 400 K/uL   nRBC 0.0 0.0 - 0.2 %    Comment: Performed at [facility], [address] [place], [place] [address]  Urinalysis, Routine w reflex microscopic     Status: Abnormal   Collection Time: 01/14/19  1:58 PM  Result Value Ref Range   Color, Urine YELLOW (A) YELLOW   APPearance CLOUDY (A) CLEAR   Specific Gravity, Urine 1.025 1.005 - 1.030   pH 7.0 5.0 - 8.0   Glucose, UA >=500 (A) NEGATIVE mg/dL   Hgb urine dipstick LARGE (A) NEGATIVE   Bilirubin Urine NEGATIVE NEGATIVE   Ketones, ur NEGATIVE NEGATIVE mg/dL   Protein, ur 100 (A) NEGATIVE mg/dL   Nitrite NEGATIVE NEGATIVE   Leukocytes,Ua MODERATE (A) NEGATIVE   RBC / HPF >50 (H) 0 - 5 RBC/hpf   WBC, UA >50 (H) 0 - 5 WBC/hpf   Bacteria, UA MANY (A) NONE SEEN   Squamous Epithelial / LPF NONE SEEN 0 - 5   WBC Clumps PRESENT     Comment: Performed at [facility], [address] [place], [place] [address]  Pregnancy, urine POC     Status: None   Collection Time: 01/14/19  2:28 PM  Result Value Ref Range   Preg Test, Ur NEGATIVE NEGATIVE    Comment:        THE SENSITIVITY OF THIS METHODOLOGY IS >24 mIU/mL    CT Renal Stone Study  Result Date: 01/14/2019 CLINICAL DATA:  Right flank pain EXAM: CT ABDOMEN AND PELVIS WITHOUT CONTRAST TECHNIQUE: Multidetector CT imaging of the abdomen and pelvis was performed following the standard protocol without oral or IV contrast. COMPARISON:  May 08, 2015 FINDINGS: Lower chest: There is slight scarring in each anterior lung base. Lung bases otherwise are clear. Hepatobiliary: No focal liver lesions are appreciable on this noncontrast enhanced study. Gallbladder  wall is not appreciably thickened. There is no appreciable biliary duct dilatation. Pancreas: There is no pancreatic mass or inflammatory focus. Spleen: No splenic lesions are appreciable. Adrenals/Urinary Tract: Adrenals bilaterally appear normal. Native kidneys there are multiple cysts in each kidney. Largest cyst is in the lower pole of the right native kidney measuring 2.3 x 2.1 cm. There is an apparent hyperdense cyst in the posterior mid right native kidney with a Hounsfield unit value of 85, consistent with hyperdense cyst. There is mild hydronephrosis of the native right kidney. No hydronephrosis involving the native left kidney. There is no native  kidney renal or ureteral calculus on each side. There is an extrarenal pelvis in the native right kidney. There is a transplant kidney in the right lower quadrant without demonstrable mass or hydronephrosis. There is no appreciable renal or ureteral calculus involving the transplant kidney. The urinary bladder is midline with thickening of the urinary bladder wall. Stomach/Bowel: Patient is status post gastric bypass procedure. No wall thickening or fluid in the postoperative areas. There is no evident bowel obstruction. Terminal ileum appears unremarkable. There is no appreciable free air or portal venous air. Vascular/Lymphatic: There is no abdominal aortic aneurysm. There are foci of aortic and iliac artery atherosclerotic calcification. No adenopathy is evident in the abdomen or pelvis. Reproductive: Uterus is anteverted.  No pelvic mass evident. Other: Appendix appears normal. There is a fluid collection in the anterior right upper pelvic wall region which abuts the anterior aspect of the rectus muscles. This collection measures 8.0 x 2.5 cm. Fluid within this collection is serous period there is no air within this collection. There is mild scarring in the anterior abdominal wall on the right in the area of this collection. There is thinning of the rectus  muscle in the midline with a small umbilical hernia containing only fat. No abscess or ascites is evident in the abdomen or pelvis. : Musculoskeletal: There is extensive postoperative change throughout the lower thoracic and upper lumbar region. There is evidence of a previous fracture of T10 with remodeling anteriorly. There is spondylolisthesis at T9-10. There is mild spinal stenosis at L4-5 due to bony hypertrophy and disc protrusion. There are no blastic or lytic bone lesions. No intramuscular lesions are evident. IMPRESSION: 1. Mild hydronephrosis involving the native right kidney without evident renal or ureteral calculus. This finding potentially could indicate recent calculus passage but also could be indicative of early changes of pyelonephritis involving the native right kidney. No abscess or perinephric fluid involving this kidney. 2. No hydronephrosis involving the transplant kidney in the right lower quadrant. No renal or ureteral calculus involving the transplant kidney. 3. Urinary bladder wall thickening consistent with a degree of cystitis. 4. Fluid collection with mildly thickened wall in the right upper pelvic region. This collection measures 8.0 x 2.5 cm. Suspect seroma or liquified hematoma as most likely etiology. 4. Status post gastric bypass procedure without complicating features. No bowel obstruction. Appendix appears normal. No abscess in the abdomen or pelvis. 5. Thinning of the rectus muscle in the midline. Small umbilical hernia containing only fat. 6. Extensive postoperative change in the lower thoracic and lumbar region. Previous fracture at T10 with remodeling period spondylolisthesis noted at T9-10 with posterior fixation in the lower thoracic and upper lumbar region. 7.  Aortic Atherosclerosis (ICD10-I70.0). 8. 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 Electronically Signed   By: Lowella Grip III M.D.   On: 01/14/2019 16:09    Pending  Labs Unresulted Labs (From admission, onward)    Start     Ordered   01/14/19 1712  Urine culture  Add-on,   AD     01/14/19 1712   Signed and Held  HIV Antibody (routine testing w rflx)  (HIV Antibody (Routine testing w reflex) panel)  Once,   R     Signed and Held   Signed and Held  Basic metabolic panel  Tomorrow morning,   R     Signed and Held   Signed and Held  CBC  Tomorrow morning,   R  Signed and Held          Vitals/Pain Today's Vitals   01/14/19 1400 01/14/19 1649 01/14/19 1805 01/14/19 1905  BP:   112/76   Pulse:   79   Resp:   19   Temp:   98.3 F (36.8 C)   TempSrc:   Oral   SpO2:   100%   Weight:    112.4 kg  Height:    5' 4"  (1.626 m)  PainSc: 10-Worst pain ever 8  10-Worst pain ever     Isolation Precautions No active isolations  Medications Medications  oxyCODONE-acetaminophen (PERCOCET/ROXICET) 5-325 MG per tablet 1 tablet (1 tablet Oral Given 01/14/19 1405)  fentaNYL (SUBLIMAZE) injection 50 mcg (50 mcg Intravenous Given 01/14/19 1827)  cefTRIAXone (ROCEPHIN) 1 g in sodium chloride 0.9 % 100 mL IVPB (0 g Intravenous Stopped 01/14/19 2008)    Mobility walks Low fall risk      R Recommendations: See Admitting Provider Note  Report given to:   Additional Notes:

## 2019-01-14 NOTE — ED Notes (Signed)
This RN contacted lab regarding UA. Per lab tech [place].UA is at lab and will be processed.

## 2019-01-14 NOTE — H&P (Signed)
History and Physical        Hospital Admission Note Date: 01/14/2019  Patient name: [name] Medical record number: [id number] Date of birth: [birth year] Age: 33 y.o. Gender: female  PCP: Caryl Never, MD    Patient coming from: Home   I have reviewed all records in the [facility].    Chief Complaint:  Right Flank Pain   HPI: [name] is 33 y.o. female with h/o kidney transplant in July 2020, T1DM, HTN who presents for right flank pain. Awoke her from sleep at 6 am this morning. Has been sharp pain that has persisted throughout the day. Radiates into her abdomen. Reports that urine has been darker in color with a foul odor for the past week or so. She has had UTIs in the past. Denies fevers/nausea/vomiting.    ED work-up/course:  Patient with history of a kidney transplant who presents to the emergency department today because of concerns for right flank pain.  CT scan was performed from triage and was concerning for right native kidney pyelonephritis.  Her urine is consistent with infection.  I discussed with transplant doctor at [facility].  At this time will plan on admission for IV antibiotics for pyelonephritis.  While patient does have allergies to penicillin I did ask pharmacy to see if she has received cephalosporins in the past.  Per pharmacy she did receive cephalosporins in July.  Per urine culture from [facility] sent in October most recent bacteria was sensitive to Rocephin.  Discussed findings and plan with patient  Review of Systems: Positives marked in 'bold' Constitutional: Denies fever, chills, diaphoresis, poor appetite and fatigue.  HEENT: Denies photophobia, eye pain, redness, hearing loss, ear pain, congestion, sore throat, rhinorrhea, sneezing, mouth sores, trouble swallowing, neck pain, neck stiffness and tinnitus.   Respiratory: Denies SOB, DOE, cough, chest tightness,   and wheezing.   Cardiovascular: Denies chest pain, palpitations and leg swelling.  Gastrointestinal: Denies nausea, vomiting, abdominal pain, diarrhea, constipation, blood in stool and abdominal distention.  Genitourinary: Denies dysuria, urgency, frequency, hematuria, flank pain and difficulty urinating.  Musculoskeletal: Denies myalgias, back pain, joint swelling, arthralgias and gait problem.  Skin: Denies pallor, rash and wound.  Neurological: Denies dizziness, seizures, syncope, weakness, light-headedness, numbness and headaches.  Hematological: Denies adenopathy. Easy bruising, personal or family bleeding history  Psychiatric/Behavioral: Denies suicidal ideation, mood changes, confusion, nervousness, sleep disturbance and agitation  Past Medical History: Past Medical History:  Diagnosis Date  . Anemia   . Anxiety   . Asthma   . Diabetes mellitus   . Dysrhythmia   . 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 GERD (gastroesophageal reflux disease)   . History of MRSA infection 2002   right foot  . Hypertension   . Irregular heart beat   . Morbid obesity ([place])   . Renal insufficiency   . Seizures ([place])    was on medication for awhile then stopped taking it; no seizure for over 7-8 years (2012?)  . Sleep apnea    no problems since weight loss surgery 06/2015    Past Surgical History:  Procedure Laterality Date  .  A/V FISTULAGRAM Right 10/09/2017   Procedure: A/V FISTULAGRAM;  Surgeon: Algernon Huxley, MD;  Location: [place] [facility];  Service: Cardiovascular;  Laterality: Right;  . A/V FISTULAGRAM Right 12/21/2017   Procedure: A/V FISTULAGRAM;  Surgeon: Algernon Huxley, MD;  Location: [place] [facility];  Service: Cardiovascular;  Laterality: Right;  . A/V FISTULAGRAM Right 06/27/2018   Procedure: A/V FISTULAGRAM;  Surgeon: Algernon Huxley, MD;  Location: [place] [facility];  Service: Cardiovascular;  Laterality: Right;  . AV FISTULA PLACEMENT  08/26/10   Left Radiocephalic  AVF  . AV FISTULA PLACEMENT  08/26/2015   Procedure: ARTERIOVENOUS (AV) FISTULA CREATION;  Surgeon: Algernon Huxley, MD;  Location: [facility];  Service: Vascular;;  . AV FISTULA PLACEMENT Right 06/28/2017   Procedure: ARTERIOVENOUS (AV) FISTULA CREATION ( BRACHIAL BASILIC );  Surgeon: Algernon Huxley, MD;  Location: [facility];  Service: Vascular;  Laterality: Right;  . BACK SURGERY     infection in spine (rods, screws, spacers)  . BASCILIC VEIN TRANSPOSITION Right 08/16/2017   Procedure: BASCILIC VEIN TRANSPOSITION (2ND STAGE UPPER);  Surgeon: Algernon Huxley, MD;  Location: [facility];  Service: Vascular;  Laterality: Right;  . CARPAL TUNNEL RELEASE Left 05/26/2017   Procedure: CARPAL TUNNEL RELEASE;  Surgeon: Hessie Knows, MD;  Location: [facility];  Service: Orthopedics;  Laterality: Left;  . CARPAL TUNNEL RELEASE Left 06/09/2017   Procedure: CARPAL TUNNEL RELEASE;  Surgeon: Hessie Knows, MD;  Location: [facility];  Service: Orthopedics;  Laterality: Left;  . CARPAL TUNNEL RELEASE Right 2014  . DIALYSIS/PERMA CATHETER REMOVAL N/A 03/07/2016   Procedure: Dialysis/Perma Catheter Removal;  Surgeon: Algernon Huxley, MD;  Location: [place] [facility];  Service: Cardiovascular;  Laterality: N/A;  . EYE SURGERY     laser photocoagulation  . EYE SURGERY Right 2008   removal of blood clot  . GASTRIC BYPASS  06/2015  . INTRAUTERINE DEVICE (IUD) INSERTION    . METATARSAL HEAD EXCISION Right 03/16/2018   Procedure: OSTECTOMY MET HEAD 2;  Surgeon: Albertine Patricia, DPM;  Location: [facility];  Service: Podiatry;  Laterality: Right;  . PERIPHERAL VASCULAR CATHETERIZATION N/A 07/07/2014   Procedure: A/V Shuntogram/Fistulagram;  Surgeon: Algernon Huxley, MD;  Location: [place] [facility];  Service: Cardiovascular;  Laterality: N/A;  . PERIPHERAL VASCULAR CATHETERIZATION N/A 07/07/2014   Procedure: A/V Shunt Intervention;  Surgeon: Algernon Huxley, MD;  Location: [place] [facility];  Service: Cardiovascular;  Laterality: N/A;  .  PERIPHERAL VASCULAR CATHETERIZATION Right 01/12/2015   Procedure: A/V Shuntogram/Fistulagram;  Surgeon: Algernon Huxley, MD;  Location: [place] [facility];  Service: Cardiovascular;  Laterality: Right;  . PERIPHERAL VASCULAR CATHETERIZATION N/A 01/12/2015   Procedure: A/V Shunt Intervention;  Surgeon: Algernon Huxley, MD;  Location: [place] [facility];  Service: Cardiovascular;  Laterality: N/A;  . PERIPHERAL VASCULAR CATHETERIZATION Right 06/08/2015   Procedure: A/V Shuntogram/Fistulagram;  Surgeon: Algernon Huxley, MD;  Location: [place] [facility];  Service: Cardiovascular;  Laterality: Right;  . PERIPHERAL VASCULAR CATHETERIZATION N/A 06/08/2015   Procedure: A/V Shunt Intervention;  Surgeon: Algernon Huxley, MD;  Location: [place] [facility];  Service: Cardiovascular;  Laterality: N/A;  . PERIPHERAL VASCULAR CATHETERIZATION Right 07/14/2015   Procedure: Thrombectomy;  Surgeon: Katha Cabal, MD;  Location: [place] [facility];  Service: Cardiovascular;  Laterality: Right;  . PERIPHERAL VASCULAR CATHETERIZATION N/A 07/14/2015   Procedure: A/V Shuntogram/Fistulagram;  Surgeon: Katha Cabal, MD;  Location: [place] [facility]  [facility];  Service: Cardiovascular;  Laterality: N/A;  . PERIPHERAL VASCULAR CATHETERIZATION N/A 07/17/2015   Procedure: Thrombectomy;  Surgeon: Katha Cabal, MD;  Location: [place] [facility];  Service: Cardiovascular;  Laterality: N/A;  . PERIPHERAL VASCULAR CATHETERIZATION N/A 07/17/2015   Procedure: Dialysis/Perma Catheter Insertion;  Surgeon: Katha Cabal, MD;  Location: [place] [facility];  Service: Cardiovascular;  Laterality: N/A;  . PERIPHERAL VASCULAR CATHETERIZATION N/A 08/05/2015   Procedure: Thrombectomy;  Surgeon: Katha Cabal, MD;  Location: [place] [facility];  Service: Cardiovascular;  Laterality: N/A;  declot of graft  . PERIPHERAL VASCULAR THROMBECTOMY N/A 05/10/2017   Procedure: PERIPHERAL VASCULAR THROMBECTOMY;  Surgeon: Algernon Huxley, MD;  Location:  [place] [facility];  Service: Cardiovascular;  Laterality: N/A;  . TOE AMPUTATION     both big toes removed    Medications: Prior to Admission medications   Medication Sig Start Date End Date Taking? Authorizing Provider  albuterol (PROVENTIL HFA;VENTOLIN HFA) 108 (90 Base) MCG/ACT inhaler Inhale 2 puffs into the lungs 2 (two) times daily as needed for wheezing or shortness of breath.  08/17/15 04/25/18  [provider]  aspirin 81 MG tablet Take 81 mg by mouth every evening.     [provider]  Camphor-Eucalyptus-Menthol (VICKS VAPORUB EX) Apply 1 application topically daily as needed (congestion).    [provider]  docusate sodium (COLACE) 100 MG capsule Take 100 mg by mouth daily as needed for mild constipation.     [provider]  fentaNYL (DURAGESIC - DOSED MCG/HR) 12 MCG/HR Place 12 mcg onto the skin every 3 (three) days.     [provider]  FOSRENOL 1000 MG PACK Take 1 packet by mouth See admin instructions. Take 1 packet with each meal and with large snacks 05/06/17   [provider]  HYDROcodone-acetaminophen (NORCO) 5-325 MG tablet Take 1 tablet by mouth every 6 (six) hours as needed for moderate pain. 03/16/18   Troxler, Rodman Key, DPM  insulin glargine (LANTUS) 100 UNIT/ML injection Inject 6 Units into the skin daily as needed (high blood sugar). If blood sugar is over 120 for several days in a row    [provider]  LORazepam (ATIVAN) 2 MG/ML injection Inject into the vein. 08/28/17   [provider]  midodrine (PROAMATINE) 10 MG tablet Take 10 mg by mouth 2 (two) times daily.    [provider]  naloxone [facility]) nasal spray 4 mg/0.1 mL Place 1 spray into the nose daily as needed (opioid overdose).    [provider]  omeprazole (PRILOSEC) 20 MG capsule Take 20 mg by mouth daily as needed (acid reflux).     [provider]  oxyCODONE-acetaminophen (ROXICET) 5-325 MG tablet Take 1-2  tablets by mouth every 4 (four) hours as needed. 08/16/17   Algernon Huxley, MD  PEDIATRIC MULTIPLE VITAMINS PO Take 1 tablet by mouth every evening. Flintstones    [provider]  pravastatin (PRAVACHOL) 40 MG tablet Take 40 mg by mouth at bedtime.     [provider]  promethazine (PHENERGAN) 12.5 MG tablet Take 12.5 mg by mouth every 6 (six) hours as needed for nausea or vomiting.    [provider]  Skin Protectants, Misc. (DERMACERIN) CREA Apply 1 application topically daily as needed (itching / dry skin).  12/20/16   [provider]    Allergies:   Allergies  Allergen Reactions  . Marcillin [Ampicillin] Rash and Other (See Comments)  Breathing and heart palp  Hot and sweating Did it involve swelling of the face/tongue/throat, SOB, or low BP? Yes Did it involve sudden or severe rash/hives, skin peeling, or any reaction on the inside of your mouth or nose? Yes Did you need to seek medical attention at a hospital or doctor's office? No When did it last happen?2 years  If all above answers are "NO", may proceed with cephalosporin use.   [name] Kitchen Penicillins Other (See Comments)    Heart races,sweats    . Latex Swelling and Rash  . Vancomycin Itching, Nausea Only and Rash    Thick pieces of skin pulls off.    Social History:  reports that she has been smoking cigarettes. She has a 2.50 pack-year smoking history. She has never used smokeless tobacco. She reports that she does not drink alcohol or use drugs.  Family History: Family History  Problem Relation Age of Onset  . Diabetes Mother   . Heart attack Mother   . Stroke Mother   . High Cholesterol Mother   . Diabetes Father     Physical Exam: Blood pressure 112/76, pulse 79, temperature 98.3 F (36.8 C), temperature source Oral, resp. rate 19, height 5' 4" (1.626 m), weight 112.4 kg, SpO2 100 %. General: Alert, awake, oriented x3, in no acute distress. Eyes: pink conjunctiva,anicteric  sclera, pupils equal and reactive to light and accomodation, HEENT: normocephalic, atraumatic, oropharynx clear Neck: supple, no masses or lymphadenopathy, no goiter, no bruits, no JVD CVS: Regular rate and rhythm, without murmurs, rubs or gallops. No lower extremity edema Resp : Clear to auscultation bilaterally, no wheezing, rales or rhonchi. GI : Soft, nontender, nondistended, positive bowel sounds, no masses. No hepatomegaly. No hernia.  Musculoskeletal: No clubbing or cyanosis, positive pedal pulses. No contracture. ROM intact. Right CVA tenderness present.  Neuro: Grossly intact, no focal neurological deficits, strength 5/5 upper and lower extremities bilaterally Psych: alert and oriented x 3, normal mood and affect Skin: no rashes or lesions, warm and dry   LABS on Admission: I have personally reviewed all the labs and imagings below    Basic Metabolic Panel: Recent Labs  Lab 01/14/19 1338  NA 138  K 4.2  CL 106  CO2 25  GLUCOSE 371*  BUN 21*  CREATININE 1.43*  CALCIUM 10.1   Liver Function Tests: Recent Labs  Lab 01/14/19 1338  AST 12*  ALT 8  ALKPHOS 125  BILITOT 0.8  PROT 7.9  ALBUMIN 4.0   No results for input(s): LIPASE, AMYLASE in the last 168 hours. No results for input(s): AMMONIA in the last 168 hours. CBC: Recent Labs  Lab 01/14/19 1338  WBC 13.6*  HGB 14.4  HCT 43.8  MCV 77.5*  PLT 248   Cardiac Enzymes: No results for input(s): CKTOTAL, CKMB, CKMBINDEX, TROPONINI in the last 168 hours. BNP: Invalid input(s): POCBNP CBG: No results for input(s): GLUCAP in the last 168 hours.  Radiological Exams on Admission:  CT Renal Stone Study  Result Date: 01/14/2019 CLINICAL DATA:  Right flank pain EXAM: CT ABDOMEN AND PELVIS WITHOUT CONTRAST TECHNIQUE: Multidetector CT imaging of the abdomen and pelvis was performed following the standard protocol without oral or IV contrast. COMPARISON:  May 08, 2015 FINDINGS: Lower chest: There is slight scarring  in each anterior lung base. Lung bases otherwise are clear. Hepatobiliary: No focal liver lesions are appreciable on this noncontrast enhanced study. Gallbladder wall is not appreciably thickened. There is no appreciable biliary duct dilatation. Pancreas: There is  no pancreatic mass or inflammatory focus. Spleen: No splenic lesions are appreciable. Adrenals/Urinary Tract: Adrenals bilaterally appear normal. Native kidneys there are multiple cysts in each kidney. Largest cyst is in the lower pole of the right native kidney measuring 2.3 x 2.1 cm. There is an apparent hyperdense cyst in the posterior mid right native kidney with a Hounsfield unit value of 85, consistent with hyperdense cyst. There is mild hydronephrosis of the native right kidney. No hydronephrosis involving the native left kidney. There is no native kidney renal or ureteral calculus on each side. There is an extrarenal pelvis in the native right kidney. There is a transplant kidney in the right lower quadrant without demonstrable mass or hydronephrosis. There is no appreciable renal or ureteral calculus involving the transplant kidney. The urinary bladder is midline with thickening of the urinary bladder wall. Stomach/Bowel: Patient is status post gastric bypass procedure. No wall thickening or fluid in the postoperative areas. There is no evident bowel obstruction. Terminal ileum appears unremarkable. There is no appreciable free air or portal venous air. Vascular/Lymphatic: There is no abdominal aortic aneurysm. There are foci of aortic and iliac artery atherosclerotic calcification. No adenopathy is evident in the abdomen or pelvis. Reproductive: Uterus is anteverted.  No pelvic mass evident. Other: Appendix appears normal. There is a fluid collection in the anterior right upper pelvic wall region which abuts the anterior aspect of the rectus muscles. This collection measures 8.0 x 2.5 cm. Fluid within this collection is serous period there is no  air within this collection. There is mild scarring in the anterior abdominal wall on the right in the area of this collection. There is thinning of the rectus muscle in the midline with a small umbilical hernia containing only fat. No abscess or ascites is evident in the abdomen or pelvis. : Musculoskeletal: There is extensive postoperative change throughout the lower thoracic and upper lumbar region. There is evidence of a previous fracture of T10 with remodeling anteriorly. There is spondylolisthesis at T9-10. There is mild spinal stenosis at L4-5 due to bony hypertrophy and disc protrusion. There are no blastic or lytic bone lesions. No intramuscular lesions are evident. IMPRESSION: 1. Mild hydronephrosis involving the native right kidney without evident renal or ureteral calculus. This finding potentially could indicate recent calculus passage but also could be indicative of early changes of pyelonephritis involving the native right kidney. No abscess or perinephric fluid involving this kidney. 2. No hydronephrosis involving the transplant kidney in the right lower quadrant. No renal or ureteral calculus involving the transplant kidney. 3. Urinary bladder wall thickening consistent with a degree of cystitis. 4. Fluid collection with mildly thickened wall in the right upper pelvic region. This collection measures 8.0 x 2.5 cm. Suspect seroma or liquified hematoma as most likely etiology. 4. Status post gastric bypass procedure without complicating features. No bowel obstruction. Appendix appears normal. No abscess in the abdomen or pelvis. 5. Thinning of the rectus muscle in the midline. Small umbilical hernia containing only fat. 6. Extensive postoperative change in the lower thoracic and lumbar region. Previous fracture at T10 with remodeling period spondylolisthesis noted at T9-10 with posterior fixation in the lower thoracic and upper lumbar region. 7.  Aortic Atherosclerosis (ICD10-I70.0). 8. Multiple cysts  in native kidneys including a hyperdense cyst in the right kidney. Electronically Signed   By: Lowella Grip III M.D.   On: 01/14/2019 16:09      EKG: None    Assessment/Plan Active Problems:   Essential hypertension,  benign   Diabetes mellitus type 1, uncontrolled, insulin dependent ([place])   Hyperlipidemia associated with type 2 diabetes mellitus ([facility])   Mild intermittent asthma without complication   Immunosuppression ([place])   History of kidney transplant   Flank pain   Pyelonephritis  Pyelonephritis/Flank Pain  CT renal stone: mild hydro involving native right kidney. No abnormality around transplanted kidney. Urinary bladder wall thickening present. Fluid collection in right upper pelvic region. UA with moderate leukocytes, >50 RBCs, >50 WBCs, many bacteria. ED provider discussed with transplant physician at [facility]. Recommended admission for IV antibiotics for pyelonephritis. Patient is clinically stable.  -admit for observation, vital signs per unit  -continue Rocephin, narrow pending sensitivities -urine culture pending  -morphine 2 mg q4h for severe pain   History of Kidney Transplant on Immunosuppression   Cr 1.43 at admission, per [facility] this is consistent with patient's baseline.  -gentle hydration with IVF NS 125 cc/hr overnight  -repeat BMET in AM  -continue prednisone 5 mg and tacrolimus 5 mg daily  -change Myfortic to 360 mg BID (from 720 mg BID) during hospitalization per [facility] transplant team   T1DM  -continue Lantus 32u daily  -start sensitive SSI  -CBGs AC/HS   HTN  BP initially elevated at presentation, now normotensive.  -continue HCTZ 12.5 mg daily   Asthma  Mild intermittent. Stable at admission.    DVT prophylaxis: Lovenox   CODE STATUS: FULL   Consults called: None   Family Communication: Admission, patients condition and plan of care including tests being ordered have been discussed with the patient who indicates understanding and  agree with the plan and Code Status  Admission status:   The medical decision making on this patient was of high complexity and the patient is at high risk for clinical deterioration, therefore this is a level 3 admission.  Severity of Illness:     Moderate  The appropriate patient status for this patient is OBSERVATION. Observation status is judged to be reasonable and necessary in order to provide the required intensity of service to ensure the patient's safety. The patient's presenting symptoms, physical exam findings, and initial radiographic and laboratory data in the context of their medical condition is felt to place them at decreased risk for further clinical deterioration. Furthermore, it is anticipated that the patient will be medically stable for discharge from the hospital within 2 midnights of admission. The following factors support the patient status of observation.   " The patient's presenting symptoms include flank pain. " The physical exam findings include CVA tenderness. " The initial radiographic and laboratory data are UA concerning for infection, CT renal stone with right hydronephrosis/free fluid in right renal pelvis/bladder wall thickening.     Time Spent on Admission: 45 minutes      Melina Schools D.O.  [facility] 01/14/2019, 7:58 PM

## 2019-01-14 NOTE — ED Notes (Signed)
Privacy screen placed by this RN due to pt in 1H bed.

## 2019-01-14 NOTE — ED Notes (Signed)
Pt ambulated to bathroom NAD noted by this RN. Family at bedside.

## 2019-01-14 NOTE — Progress Notes (Signed)
Patient states she is "checking out" tomorrow and "checking back in" on Wednesday due to a rental car, therefore refuses to send home medications down to the pharmacy. Patient states she has not taken any medications at this time. Patient states she has no one to come pick medications up at this time.

## 2019-01-14 NOTE — ED Provider Notes (Signed)
[facility] Emergency Department Provider Note  ____________________________________________   I have reviewed the triage vital signs and the nursing notes.   HISTORY  Chief Complaint Flank Pain   History limited by: Not Limited   HPI [name] is a 33 y.o. female who presents to the emergency department today because of concerns for right flank pain.  The patient states that the pain started this morning.  She describes it as sharp.  It was located in her right flank area.  It has been somewhat persistent throughout the day.  She has also noticed that when she urinates there is a slightly bad odor to her urine and the feeling of incomplete voiding.  The patient has had urinary tract infections in the past.  She does have a history of renal transplant this summer.  She did not have a chance to talk to her transplant coordinator today about her symptoms.  She denies any fevers.  Denies any nausea or vomiting.  Records reviewed. Per medical record review patient has a history of HTN, DM, asthma. Renal transplant through [facility].   Past Medical History:  Diagnosis Date  . Anemia   . Anxiety   . Asthma   . Diabetes mellitus   . Dysrhythmia   . 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 GERD (gastroesophageal reflux disease)   . History of MRSA infection 2002   right foot  . Hypertension   . Irregular heart beat   . Morbid obesity ([place])   . Renal insufficiency   . Seizures ([place])    was on medication for awhile then stopped taking it; no seizure for over 7-8 years (2012?)  . Sleep apnea    no problems since weight loss surgery 06/2015    Patient Active Problem List   Diagnosis Date Noted  . Hyperkalemia 05/01/2017  . Essential hypertension, benign 02/05/2016  . Diabetes ([place]) 02/05/2016  . Complication from renal dialysis device 07/07/2014  . End stage renal disease ([place]) 12/29/2010    Past Surgical History:   Procedure Laterality Date  . A/V FISTULAGRAM Right 10/09/2017   Procedure: A/V FISTULAGRAM;  Surgeon: Algernon Huxley, MD;  Location: [place] [facility];  Service: Cardiovascular;  Laterality: Right;  . A/V FISTULAGRAM Right 12/21/2017   Procedure: A/V FISTULAGRAM;  Surgeon: Algernon Huxley, MD;  Location: [place] [facility];  Service: Cardiovascular;  Laterality: Right;  . A/V FISTULAGRAM Right 06/27/2018   Procedure: A/V FISTULAGRAM;  Surgeon: Algernon Huxley, MD;  Location: [place] [facility];  Service: Cardiovascular;  Laterality: Right;  . AV FISTULA PLACEMENT  08/26/10   Left Radiocephalic AVF  . AV FISTULA PLACEMENT  08/26/2015   Procedure: ARTERIOVENOUS (AV) FISTULA CREATION;  Surgeon: Algernon Huxley, MD;  Location: [facility];  Service: Vascular;;  . AV FISTULA PLACEMENT Right 06/28/2017   Procedure: ARTERIOVENOUS (AV) FISTULA CREATION ( BRACHIAL BASILIC );  Surgeon: Algernon Huxley, MD;  Location: [facility];  Service: Vascular;  Laterality: Right;  . BACK SURGERY     infection in spine (rods, screws, spacers)  . BASCILIC VEIN TRANSPOSITION Right 08/16/2017   Procedure: BASCILIC VEIN TRANSPOSITION (2ND STAGE UPPER);  Surgeon: Algernon Huxley, MD;  Location: [facility];  Service: Vascular;  Laterality: Right;  . CARPAL TUNNEL RELEASE Left 05/26/2017   Procedure: CARPAL TUNNEL RELEASE;  Surgeon: Hessie Knows, MD;  Location: [facility];  Service: Orthopedics;  Laterality: Left;  . CARPAL TUNNEL  RELEASE Left 06/09/2017   Procedure: CARPAL TUNNEL RELEASE;  Surgeon: Hessie Knows, MD;  Location: [facility];  Service: Orthopedics;  Laterality: Left;  . CARPAL TUNNEL RELEASE Right 2014  . DIALYSIS/PERMA CATHETER REMOVAL N/A 03/07/2016   Procedure: Dialysis/Perma Catheter Removal;  Surgeon: Algernon Huxley, MD;  Location: [place] [facility];  Service: Cardiovascular;  Laterality: N/A;  . EYE SURGERY     laser photocoagulation  . EYE SURGERY Right 2008   removal of blood clot  . GASTRIC BYPASS  06/2015  . INTRAUTERINE  DEVICE (IUD) INSERTION    . METATARSAL HEAD EXCISION Right 03/16/2018   Procedure: OSTECTOMY MET HEAD 2;  Surgeon: Albertine Patricia, DPM;  Location: [facility];  Service: Podiatry;  Laterality: Right;  . PERIPHERAL VASCULAR CATHETERIZATION N/A 07/07/2014   Procedure: A/V Shuntogram/Fistulagram;  Surgeon: Algernon Huxley, MD;  Location: [place] [facility];  Service: Cardiovascular;  Laterality: N/A;  . PERIPHERAL VASCULAR CATHETERIZATION N/A 07/07/2014   Procedure: A/V Shunt Intervention;  Surgeon: Algernon Huxley, MD;  Location: [place] [facility];  Service: Cardiovascular;  Laterality: N/A;  . PERIPHERAL VASCULAR CATHETERIZATION Right 01/12/2015   Procedure: A/V Shuntogram/Fistulagram;  Surgeon: Algernon Huxley, MD;  Location: [place] [facility];  Service: Cardiovascular;  Laterality: Right;  . PERIPHERAL VASCULAR CATHETERIZATION N/A 01/12/2015   Procedure: A/V Shunt Intervention;  Surgeon: Algernon Huxley, MD;  Location: [place] [facility];  Service: Cardiovascular;  Laterality: N/A;  . PERIPHERAL VASCULAR CATHETERIZATION Right 06/08/2015   Procedure: A/V Shuntogram/Fistulagram;  Surgeon: Algernon Huxley, MD;  Location: [place] [facility];  Service: Cardiovascular;  Laterality: Right;  . PERIPHERAL VASCULAR CATHETERIZATION N/A 06/08/2015   Procedure: A/V Shunt Intervention;  Surgeon: Algernon Huxley, MD;  Location: [place] [facility];  Service: Cardiovascular;  Laterality: N/A;  . PERIPHERAL VASCULAR CATHETERIZATION Right 07/14/2015   Procedure: Thrombectomy;  Surgeon: Katha Cabal, MD;  Location: [place] [facility];  Service: Cardiovascular;  Laterality: Right;  . PERIPHERAL VASCULAR CATHETERIZATION N/A 07/14/2015   Procedure: A/V Shuntogram/Fistulagram;  Surgeon: Katha Cabal, MD;  Location: [place] [facility];  Service: Cardiovascular;  Laterality: N/A;  . PERIPHERAL VASCULAR CATHETERIZATION N/A 07/17/2015   Procedure: Thrombectomy;  Surgeon: Katha Cabal, MD;  Location: [place] [facility];   Service: Cardiovascular;  Laterality: N/A;  . PERIPHERAL VASCULAR CATHETERIZATION N/A 07/17/2015   Procedure: Dialysis/Perma Catheter Insertion;  Surgeon: Katha Cabal, MD;  Location: [place] [facility];  Service: Cardiovascular;  Laterality: N/A;  . PERIPHERAL VASCULAR CATHETERIZATION N/A 08/05/2015   Procedure: Thrombectomy;  Surgeon: Katha Cabal, MD;  Location: [place] [facility];  Service: Cardiovascular;  Laterality: N/A;  declot of graft  . PERIPHERAL VASCULAR THROMBECTOMY N/A 05/10/2017   Procedure: PERIPHERAL VASCULAR THROMBECTOMY;  Surgeon: Algernon Huxley, MD;  Location: [place] [facility];  Service: Cardiovascular;  Laterality: N/A;  . TOE AMPUTATION     both big toes removed    Prior to Admission medications   Medication Sig Start Date End Date Taking? Authorizing Provider  albuterol (PROVENTIL HFA;VENTOLIN HFA) 108 (90 Base) MCG/ACT inhaler Inhale 2 puffs into the lungs 2 (two) times daily as needed for wheezing or shortness of breath.  08/17/15 04/25/18  [provider]  aspirin 81 MG tablet Take 81 mg by mouth every evening.     [provider]  Camphor-Eucalyptus-Menthol (VICKS VAPORUB EX) Apply 1 application topically daily as needed (congestion).    [provider]  docusate  sodium (COLACE) 100 MG capsule Take 100 mg by mouth daily as needed for mild constipation.     [provider]  fentaNYL (DURAGESIC - DOSED MCG/HR) 12 MCG/HR Place 12 mcg onto the skin every 3 (three) days.     [provider]  FOSRENOL 1000 MG PACK Take 1 packet by mouth See admin instructions. Take 1 packet with each meal and with large snacks 05/06/17   [provider]  HYDROcodone-acetaminophen (NORCO) 5-325 MG tablet Take 1 tablet by mouth every 6 (six) hours as needed for moderate pain. 03/16/18   Troxler, Rodman Key, DPM  insulin glargine (LANTUS) 100 UNIT/ML injection Inject 6 Units into the skin daily as needed (high blood sugar). If blood  sugar is over 120 for several days in a row    [provider]  LORazepam (ATIVAN) 2 MG/ML injection Inject into the vein. 08/28/17   [provider]  midodrine (PROAMATINE) 10 MG tablet Take 10 mg by mouth 2 (two) times daily.    [provider]  naloxone [facility]) nasal spray 4 mg/0.1 mL Place 1 spray into the nose daily as needed (opioid overdose).    [provider]  omeprazole (PRILOSEC) 20 MG capsule Take 20 mg by mouth daily as needed (acid reflux).     [provider]  oxyCODONE-acetaminophen (ROXICET) 5-325 MG tablet Take 1-2 tablets by mouth every 4 (four) hours as needed. 08/16/17   Algernon Huxley, MD  PEDIATRIC MULTIPLE VITAMINS PO Take 1 tablet by mouth every evening. Flintstones    [provider]  pravastatin (PRAVACHOL) 40 MG tablet Take 40 mg by mouth at bedtime.     [provider]  promethazine (PHENERGAN) 12.5 MG tablet Take 12.5 mg by mouth every 6 (six) hours as needed for nausea or vomiting.    [provider]  Skin Protectants, Misc. (DERMACERIN) CREA Apply 1 application topically daily as needed (itching / dry skin).  12/20/16   [provider]    Allergies Marcillin [ampicillin], Penicillins, Latex, and Vancomycin  Family History  Problem Relation Age of Onset  . Diabetes Mother   . Heart attack Mother   . Stroke Mother   . High Cholesterol Mother   . Diabetes Father     Social History Social History   Tobacco Use  . Smoking status: Light Tobacco Smoker    Packs/day: 0.25    Years: 10.00    Pack years: 2.50    Types: Cigarettes  . Smokeless tobacco: Never Used  Substance Use Topics  . Alcohol use: No  . Drug use: No    Review of Systems Constitutional: No fever/chills Eyes: No visual changes. ENT: No sore throat. Cardiovascular: Denies chest pain. Respiratory: Denies shortness of breath. Gastrointestinal: Positive for right flank pain. Genitourinary: Bad odor to her  urine.  Musculoskeletal: Negative for back pain. Skin: Negative for rash. Neurological: Negative for headaches, focal weakness or numbness.  ____________________________________________   PHYSICAL EXAM:  VITAL SIGNS: ED Triage Vitals  Enc Vitals Group     BP 01/14/19 1336 (!) 158/94     Pulse Rate 01/14/19 1336 100     Resp 01/14/19 1336 20     Temp 01/14/19 1336 98.5 F (36.9 C)     Temp src --      SpO2 01/14/19 1336 99 %     Weight 01/14/19 1336 240 lb (108.9 kg)     Height 01/14/19 1336 5' 4"  (1.626 m)     Head Circumference --  Peak Flow --      Pain Score 01/14/19 1354 10   Constitutional: Alert and oriented.  Eyes: Conjunctivae are normal.  ENT      Head: Normocephalic and atraumatic.      Nose: No congestion/rhinnorhea.      Mouth/Throat: Mucous membranes are moist.      Neck: No stridor. Hematological/Lymphatic/Immunilogical: No cervical lymphadenopathy. Cardiovascular: Normal rate, regular rhythm.  No murmurs, rubs, or gallops.  Respiratory: Normal respiratory effort without tachypnea nor retractions. Breath sounds are clear and equal bilaterally. No wheezes/rales/rhonchi. Gastrointestinal: Soft and non tender. No rebound. No guarding.  Genitourinary: Deferred Musculoskeletal: Normal range of motion in all extremities. No lower extremity edema. Neurologic:  Normal speech and language. No gross focal neurologic deficits are appreciated.  Skin:  Skin is warm, dry and intact. No rash noted. Psychiatric: Mood and affect are normal. Speech and behavior are normal. Patient exhibits appropriate insight and judgment.  ____________________________________________    LABS (pertinent positives/negatives)  Upreg negative CBC wbc 13.6, hgb 14.4, plt 248 CMP na 138, k 4.2, glu 371, cr 1.43 UA cloudy, large hgb dipstick, moderate leukocytes,  >50 rbc and wbc, many bacteria, wbc clumps  present ____________________________________________   EKG  None  ____________________________________________    RADIOLOGY  CT renal stone Mild hydronephrosis involving native right kidney. No abnormality around transplanted kidney. Urinary bladder wall thickening. Fluid collection in the right upper pelvic region.   ____________________________________________   PROCEDURES  Procedures  ____________________________________________   INITIAL IMPRESSION / ASSESSMENT AND PLAN / ED COURSE  Pertinent labs & imaging results that were available during my care of the patient were reviewed by me and considered in my medical decision making (see chart for details).   Patient with history of a kidney transplant who presents to the emergency department today because of concerns for right flank pain.  CT scan was performed from triage and was concerning for right native kidney pyelonephritis.  Her urine is consistent with infection.  I discussed with transplant doctor at [facility].  At this time will plan on admission for IV antibiotics for pyelonephritis.  While patient does have allergies to penicillin I did ask pharmacy to see if she has received cephalosporins in the past.  Per pharmacy she did receive cephalosporins in July.  Per urine culture from [facility] sent in October most recent bacteria was sensitive to Rocephin.  Discussed findings and plan with patient.   ____________________________________________   FINAL CLINICAL IMPRESSION(S) / ED DIAGNOSES  Final diagnoses:  Flank pain  Pyelonephritis     Note: This dictation was prepared with Dragon dictation. Any transcriptional errors that result from this process are unintentional     Nance Pear, MD 01/14/19 1904

## 2019-01-14 NOTE — ED Notes (Addendum)
ED Provider Archie Balboa at bedside providing update to pt.

## 2019-01-14 NOTE — ED Triage Notes (Signed)
Pt to ED with c/o right flank pain. Pt had a kidney transplant on that side in July. Pt woke with sudden onset pain and now states blood in urine.

## 2019-01-15 DIAGNOSIS — I10 Essential (primary) hypertension: Secondary | ICD-10-CM | POA: Diagnosis not present

## 2019-01-15 DIAGNOSIS — R109 Unspecified abdominal pain: Secondary | ICD-10-CM

## 2019-01-15 DIAGNOSIS — N3001 Acute cystitis with hematuria: Secondary | ICD-10-CM | POA: Insufficient documentation

## 2019-01-15 DIAGNOSIS — Z940 Kidney transplant status: Secondary | ICD-10-CM

## 2019-01-15 DIAGNOSIS — E1065 Type 1 diabetes mellitus with hyperglycemia: Secondary | ICD-10-CM

## 2019-01-15 DIAGNOSIS — N136 Pyonephrosis: Secondary | ICD-10-CM | POA: Diagnosis not present

## 2019-01-15 LAB — HEMOGLOBIN A1C
Hgb A1c MFr Bld: 12.4 % — ABNORMAL HIGH (ref 4.8–5.6)
Mean Plasma Glucose: 309.18 mg/dL

## 2019-01-15 LAB — GLUCOSE, CAPILLARY
Glucose-Capillary: 187 mg/dL — ABNORMAL HIGH (ref 70–99)
Glucose-Capillary: 147 mg/dL — ABNORMAL HIGH (ref 70–99)

## 2019-01-15 LAB — CBC
HCT: 36.2 % (ref 36.0–46.0)
Hemoglobin: 11.9 g/dL — ABNORMAL LOW (ref 12.0–15.0)
MCH: 25.3 pg — ABNORMAL LOW (ref 26.0–34.0)
MCHC: 32.9 g/dL (ref 30.0–36.0)
MCV: 77 fL — ABNORMAL LOW (ref 80.0–100.0)
Platelets: 176 10*3/uL (ref 150–400)
RBC: 4.7 MIL/uL (ref 3.87–5.11)
RDW: 13.1 % (ref 11.5–15.5)
WBC: 14.1 10*3/uL — ABNORMAL HIGH (ref 4.0–10.5)
nRBC: 0 % (ref 0.0–0.2)

## 2019-01-15 LAB — HIV ANTIBODY (ROUTINE TESTING W REFLEX): HIV Screen 4th Generation wRfx: NONREACTIVE

## 2019-01-15 LAB — BASIC METABOLIC PANEL
Anion gap: 7 (ref 5–15)
BUN: 18 mg/dL (ref 6–20)
CO2: 20 mmol/L — ABNORMAL LOW (ref 22–32)
Calcium: 9.6 mg/dL (ref 8.9–10.3)
Chloride: 109 mmol/L (ref 98–111)
Creatinine, Ser: 1.16 mg/dL — ABNORMAL HIGH (ref 0.44–1.00)
GFR calc Af Amer: >60 mL/min (ref >60)
GFR calc non Af Amer: >60 mL/min (ref >60)
Glucose, Bld: 197 mg/dL — ABNORMAL HIGH (ref 70–99)
Potassium: 3.7 mmol/L (ref 3.5–5.1)
Sodium: 136 mmol/L (ref 135–145)

## 2019-01-15 LAB — SARS CORONAVIRUS 2 (TAT 6-24 HRS): SARS Coronavirus 2: NEGATIVE

## 2019-01-15 MED ORDER — FOSFOMYCIN TROMETHAMINE 3 G PO PACK
3.0000 g | PACK | Freq: Once | ORAL | Status: AC
  Administered 2019-01-15: 3 g via ORAL
  Filled 2019-01-15 (×2): qty 3

## 2019-01-15 MED ORDER — FOSFOMYCIN TROMETHAMINE 3 G PO PACK: 3.0000 g | PACK | Freq: Once | ORAL | 0 refills | Status: AC

## 2019-01-15 NOTE — Discharge Summary (Signed)
[name] NAME: [name]    MR#:  [id number]  DATE OF BIRTH:  [birth year]  DATE OF ADMISSION:  01/14/2019 ADMITTING PHYSICIAN: Nicolette Bang, DO  DATE OF DISCHARGE: 01/15/2019  1:03 PM  PRIMARY CARE PHYSICIAN: Caryl Never, MD    ADMISSION DIAGNOSIS:  Pyelonephritis [N12]  DISCHARGE DIAGNOSIS:  Active Problems:   Essential hypertension, benign   Diabetes mellitus type 1, uncontrolled, insulin dependent ([name])   Hyperlipidemia associated with type 2 diabetes mellitus (HCC)   Mild intermittent asthma without complication   Immunosuppression ([place])   History of kidney transplant   Flank pain   Pyelonephritis   SECONDARY DIAGNOSIS:   Past Medical History:  Diagnosis Date  . Anemia   . Anxiety   . Asthma   . Diabetes mellitus   . Dysrhythmia   . 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 GERD (gastroesophageal reflux disease)   . History of MRSA infection 2002   right foot  . Hypertension   . Irregular heart beat   . Morbid obesity ([place])   . Renal insufficiency   . Seizures ([place])    was on medication for awhile then stopped taking it; no seizure for over 7-8 years (2012?)  . Sleep apnea    no problems since weight loss surgery 06/2015    HOSPITAL COURSE:   1.  Acute cystitis and flank pain, leukocytosis.  Urine analysis was positive and urine culture growing Klebsiella but sensitivities still pending.  Patient was feeling much better and wanted to go home.  She received a dose of Rocephin.  She states that she cannot tolerate antibiotic and oral form needs the one in the powder.  I prescribed fosfomycin prior to discharge and 1 more dose in a 2 days. 2.  Fluid collection in the right upper pelvic region measuring 8 x 2.5 cm which could be a seroma.  Since the patient was having no further pain, this can be followed up as outpatient. 3.  History of kidney [facility] at [facility].  No  changes in the patient's medication.  Continue prednisone, tacrolimus and Myfortic 4.  Type 1 diabetes mellitus continue Lantus and sliding scale 5.  Hypertension.  Blood pressure on the lower side.  Discontinue hydrochlorothiazide 6.  COVID-19 testing negative   DISCHARGE CONDITIONS:  Satisfactory  CONSULTS OBTAINED:  None  DRUG ALLERGIES:   Allergies  Allergen Reactions  . Marcillin [Ampicillin] Rash and Other (See Comments)    Breathing and heart palp  Hot and sweating Did it involve swelling of the face/tongue/throat, SOB, or low BP? Yes Did it involve sudden or severe rash/hives, skin peeling, or any reaction on the inside of your mouth or nose? Yes Did you need to seek medical attention at a hospital or doctor's office? No When did it last happen?2 years  If all above answers are "NO", may proceed with cephalosporin use.   [name] Kitchen Penicillins Other (See Comments)    Heart races,sweats    . Latex Swelling and Rash  . Vancomycin Itching, Nausea Only and Rash    Thick pieces of skin pulls off.    DISCHARGE MEDICATIONS:   Allergies as of 01/15/2019      Reactions   Marcillin [ampicillin] Rash, Other (See Comments)   Breathing and heart palp  Hot and sweating Did it involve swelling of the face/tongue/throat, SOB, or low BP? Yes Did it involve sudden or severe rash/hives, skin  peeling, or any reaction on the inside of your mouth or nose? Yes Did you need to seek medical attention at a hospital or doctor's office? No When did it last happen?2 years  If all above answers are "NO", may proceed with cephalosporin use.   Penicillins Other (See Comments)   Heart races,sweats    Latex Swelling, Rash   Vancomycin Itching, Nausea Only, Rash   Thick pieces of skin pulls off.      Medication List    STOP taking these medications   hydrochlorothiazide 12.5 MG capsule Commonly known as: MICROZIDE     TAKE these medications   fentaNYL 12 MCG/HR Commonly known as:  DURAGESIC Place 12 mcg onto the skin every 3 (three) days.   fosfomycin 3 g Pack Commonly known as: MONUROL Take 3 g by mouth once for 1 dose. Start taking on: January 17, 2019   HumaLOG KwikPen 100 UNIT/ML KwikPen Generic drug: insulin lispro Inject 12 Units into the skin 3 (three) times daily.   Jardiance 10 MG Tabs tablet Generic drug: empagliflozin Take 10 mg by mouth daily.   Lantus SoloStar 100 UNIT/ML Solostar Pen Generic drug: Insulin Glargine Inject 32 Units into the skin daily. What changed: Another medication with the same name was removed. Continue taking this medication, and follow the directions you see here.   mycophenolate 180 MG EC tablet Commonly known as: MYFORTIC Take 720 mg by mouth 2 (two) times daily.   omeprazole 20 MG capsule Commonly known as: PRILOSEC Take 20 mg by mouth daily as needed (acid reflux).   oxyCODONE-acetaminophen 5-325 MG tablet Commonly known as: Roxicet Take 1-2 tablets by mouth every 4 (four) hours as needed. What changed: when to take this   predniSONE 5 MG tablet Commonly known as: DELTASONE Take 5 mg by mouth daily.   sulfamethoxazole-trimethoprim 400-80 MG tablet Commonly known as: BACTRIM Take 1 tablet by mouth 3 (three) times a week. Monday, Wednesday, Friday   Tacrolimus ER 4 MG Tb24 Take with the 1mg  tablets, for a total dose of 5 mg a day   Tacrolimus ER 1 MG Tb24 Take with the 4mg  tablets, for a total dose of 5 mg a day   Virt-Phos 250 Neutral [id number] MG Tabs Take 1 tablet by mouth 2 (two) times daily.        DISCHARGE INSTRUCTIONS:   Follow-up [facility] transplant team as soon as you are able to get an appointment.  If you experience worsening of your admission symptoms, develop shortness of breath, life threatening emergency, suicidal or homicidal thoughts you must seek medical attention immediately by calling 911 or calling your MD immediately  if symptoms less severe.  You Must read complete  instructions/literature along with all the possible adverse reactions/side effects for all the Medicines you take and that have been prescribed to you. Take any new Medicines after you have completely understood and accept all the possible adverse reactions/side effects.   Please note  You were cared for by a hospitalist during your hospital stay. If you have any questions about your discharge medications or the care you received while you were in the hospital after you are discharged, you can call the unit and asked to speak with the hospitalist on call if the hospitalist that took care of you is not available. Once you are discharged, your primary care physician will handle any further medical issues. Please note that NO REFILLS for any discharge medications will be authorized once you are discharged, as  it is imperative that you return to your primary care physician (or establish a relationship with a primary care physician if you do not have one) for your aftercare needs so that they can reassess your need for medications and monitor your lab values.    Today   CHIEF COMPLAINT:   Chief Complaint  Patient presents with  . Flank Pain    HISTORY OF PRESENT ILLNESS:  [name]  is a 33 y.o. female renal transplant.  She presented with flank pain.  This pain is completely gone.  Feeling much better and wanted to go home.   VITAL SIGNS:  Blood pressure (!) 120/97, pulse 86, temperature 98.3 F (36.8 C), temperature source Oral, resp. rate 16, height 5\' 4"  (1.626 m), weight 112.4 kg, SpO2 98 %.  I/O:    Intake/Output Summary (Last 24 hours) at 01/15/2019 1651 Last data filed at 01/15/2019 1247 Gross per 24 hour  Intake 1776.01 ml  Output --  Net 1776.01 ml    PHYSICAL EXAMINATION:  GENERAL:  32 y.o.-year-old patient lying in the bed with no acute distress.  EYES: Pupils equal, round, reactive to light and accommodation. No scleral icterus. Extraocular muscles intact.  HEENT:  Head atraumatic, normocephalic. Oropharynx and nasopharynx clear.  NECK:  Supple, no jugular venous distention. No thyroid enlargement, no tenderness.  LUNGS: Normal breath sounds bilaterally, no wheezing, rales,rhonchi or crepitation. No use of accessory muscles of respiration.  CARDIOVASCULAR: S1, S2 normal. No murmurs, rubs, or gallops.  ABDOMEN: Soft, non-tender, non-distended. Bowel sounds present. No organomegaly or mass.  No CVA tenderness. EXTREMITIES: Trace pedal edema, no cyanosis, or clubbing.  NEUROLOGIC: Cranial nerves II through XII are intact. Muscle strength 5/5 in all extremities. Sensation intact. Gait not checked.  PSYCHIATRIC: The patient is alert and oriented x 3.  SKIN: No obvious rash, lesion, or ulcer.   DATA REVIEW:   CBC Recent Labs  Lab 01/15/19 0749  WBC 14.1*  HGB 11.9*  HCT 36.2  PLT 176    Chemistries  Recent Labs  Lab 01/14/19 1338 01/15/19 0749  NA 138 136  K 4.2 3.7  CL 106 109  CO2 25 20*  GLUCOSE 371* 197*  BUN 21* 18  CREATININE 1.43* 1.16*  CALCIUM 10.1 9.6  AST 12*  --   ALT 8  --   ALKPHOS 125  --   BILITOT 0.8  --     Microbiology Results  Results for orders placed or performed during the hospital encounter of 01/14/19  Urine culture     Status: Abnormal (Preliminary result)   Collection Time: 01/14/19  1:38 PM   Specimen: Urine, Random  Result Value Ref Range Status   Specimen Description   Final    URINE, RANDOM Performed at [facility], [address] [place], [place] [address]    Special Requests   Final    NONE Performed at [facility], [address], [place], [place] [address]    Culture (A)  Final    >=100,000 COLONIES/mL KLEBSIELLA PNEUMONIAE SUSCEPTIBILITIES TO FOLLOW Performed at [facility], [place] [address] [place], [place] [address]    Report Status PENDING  Incomplete  SARS CORONAVIRUS 2 (TAT 6-24 HRS) Nasopharyngeal Nasopharyngeal Swab     Status: None   Collection Time:  01/15/19  9:36 AM   Specimen: Nasopharyngeal Swab  Result Value Ref Range Status   SARS Coronavirus 2 NEGATIVE NEGATIVE Final    Comment: (NOTE) SARS-CoV-2 target nucleic acids are  NOT DETECTED. The SARS-CoV-2 RNA is generally detectable in upper and lower respiratory specimens during the acute phase of infection. Negative results do not preclude SARS-CoV-2 infection, do not rule out co-infections with other pathogens, and should not be used as the sole basis for treatment or other patient management decisions. Negative results must be combined with clinical observations, patient history, and epidemiological information. The expected result is Negative. Fact Sheet for Patients: SugarRoll.be Fact Sheet for Healthcare Providers: https://www.woods-mathews.com/ This test is not yet approved or cleared by the [place] FDA and  has been authorized for detection and/or diagnosis of SARS-CoV-2 by FDA under an Emergency Use Authorization (EUA). This EUA will remain  in effect (meaning this test can be used) for the duration of the COVID-19 declaration under Section 56 4(b)(1) of the Act, 21 U.S.C. section 360bbb-3(b)(1), unless the authorization is terminated or revoked sooner. Performed at [facility], [place] [address] [place], [place] [address]     RADIOLOGY:  CT Renal Stone Study  Result Date: 01/14/2019 CLINICAL DATA:  Right flank pain EXAM: CT ABDOMEN AND PELVIS WITHOUT CONTRAST TECHNIQUE: Multidetector CT imaging of the abdomen and pelvis was performed following the standard protocol without oral or IV contrast. COMPARISON:  May 08, 2015 FINDINGS: Lower chest: There is slight scarring in each anterior lung base. Lung bases otherwise are clear. Hepatobiliary: No focal liver lesions are appreciable on this noncontrast enhanced study. Gallbladder wall is not appreciably thickened. There is no appreciable biliary duct dilatation.  Pancreas: There is no pancreatic mass or inflammatory focus. Spleen: No splenic lesions are appreciable. Adrenals/Urinary Tract: Adrenals bilaterally appear normal. Native kidneys there are multiple cysts in each kidney. Largest cyst is in the lower pole of the right native kidney measuring 2.3 x 2.1 cm. There is an apparent hyperdense cyst in the posterior mid right native kidney with a Hounsfield unit value of 85, consistent with hyperdense cyst. There is mild hydronephrosis of the native right kidney. No hydronephrosis involving the native left kidney. There is no native kidney renal or ureteral calculus on each side. There is an extrarenal pelvis in the native right kidney. There is a transplant kidney in the right lower quadrant without demonstrable mass or hydronephrosis. There is no appreciable renal or ureteral calculus involving the transplant kidney. The urinary bladder is midline with thickening of the urinary bladder wall. Stomach/Bowel: Patient is status post gastric bypass procedure. No wall thickening or fluid in the postoperative areas. There is no evident bowel obstruction. Terminal ileum appears unremarkable. There is no appreciable free air or portal venous air. Vascular/Lymphatic: There is no abdominal aortic aneurysm. There are foci of aortic and iliac artery atherosclerotic calcification. No adenopathy is evident in the abdomen or pelvis. Reproductive: Uterus is anteverted.  No pelvic mass evident. Other: Appendix appears normal. There is a fluid collection in the anterior right upper pelvic wall region which abuts the anterior aspect of the rectus muscles. This collection measures 8.0 x 2.5 cm. Fluid within this collection is serous period there is no air within this collection. There is mild scarring in the anterior abdominal wall on the right in the area of this collection. There is thinning of the rectus muscle in the midline with a small umbilical hernia containing only fat. No abscess or  ascites is evident in the abdomen or pelvis. : Musculoskeletal: There is extensive postoperative change throughout the lower thoracic and upper lumbar region. There is evidence of a previous fracture of T10 with remodeling  anteriorly. There is spondylolisthesis at T9-10. There is mild spinal stenosis at L4-5 due to bony hypertrophy and disc protrusion. There are no blastic or lytic bone lesions. No intramuscular lesions are evident. IMPRESSION: 1. Mild hydronephrosis involving the native right kidney without evident renal or ureteral calculus. This finding potentially could indicate recent calculus passage but also could be indicative of early changes of pyelonephritis involving the native right kidney. No abscess or perinephric fluid involving this kidney. 2. No hydronephrosis involving the transplant kidney in the right lower quadrant. No renal or ureteral calculus involving the transplant kidney. 3. Urinary bladder wall thickening consistent with a degree of cystitis. 4. Fluid collection with mildly thickened wall in the right upper pelvic region. This collection measures 8.0 x 2.5 cm. Suspect seroma or liquified hematoma as most likely etiology. 4. Status post gastric bypass procedure without complicating features. No bowel obstruction. Appendix appears normal. No abscess in the abdomen or pelvis. 5. Thinning of the rectus muscle in the midline. Small umbilical hernia containing only fat. 6. Extensive postoperative change in the lower thoracic and lumbar region. Previous fracture at T10 with remodeling period spondylolisthesis noted at T9-10 with posterior fixation in the lower thoracic and upper lumbar region. 7.  Aortic Atherosclerosis (ICD10-I70.0). 8. 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 Electronically Signed   By: Lowella Grip III M.D.   On: 01/14/2019 16:09     Management plans discussed with the patient, family and they are in agreement.  CODE  STATUS:  Code Status History    Date Active Date Inactive Code Status Order ID Comments User Context   01/14/2019 2108 01/15/2019 1603 Full Code [id number]  Nicolette Bang, DO ED   06/09/2017 1419 06/09/2017 1748 Full Code [id number]  Hessie Knows, MD Inpatient   05/01/2017 1650 05/02/2017 1945 Full Code [id number]  Vaughan Basta, MD Inpatient   01/12/2015 0918 01/12/2015 1306 Full Code [id number]  Algernon Huxley, MD Inpatient   Advance Care Planning Activity      TOTAL TIME TAKING CARE OF THIS PATIENT: 35 minutes.    Loletha Grayer M.D on 01/15/2019 at 4:51 PM  Between 7am to 6pm - Pager - [phone]  After 6pm go to [email or web address] - password [email or web address]  [facility]  CC: Primary care physician; Caryl Never, MD

## 2019-01-15 NOTE — Progress Notes (Signed)
Patient resting and is tolerating IV fluids. Assisted patient to bathroom earlier in shift. Will continue to monitor patient to end of shift.

## 2019-01-16 LAB — URINE CULTURE: Culture: >=100000 — AB

## 2019-01-17 NOTE — Progress Notes (Addendum)
Brief Pharmacy Note  Patient is a 33 y/o female with medical history of kidney transplant in 08/2018, T1DM, HTN who presented to [facility] ED 12/14 with chief complaint of flank pain. CT scan concerning for right native kidney pyelonephritis and urinary bladder wall thickening consistent with a degree of cystitis. She was briefly admitted and received 1x dose of ceftriaxone and 1x dose of fosfomycin but was feeling better and wanted to leave on 12/15. Per chart, she reported intolerance to antibiotics in oral form (pills?) and requested powdered antibiotic. She was discharged with Rx for fosfomycin x1 to take two days after discharge. Urine culture from 12/14 has resulted K pneumoniae (R to Ampicillin, Ampicillin/Sulbactam, and TMP/SMX and I to nitrofurantoin).   Susceptibilities not reported for fosfomycin but this bug should be empirically covered with this antibiotic. However, fosfomycin is not typically recommended for pyelonephritis due to inadequate serum and renal tissue concentrations. Spoke with EDP with plan to assess patient. Patient reported poor appetite and back pain and chills which were resolving. Said she did not pick up the fosfomycin as her insurance did not cover it. As patient appeared to be improving and un-clear whether diagnosis was pyelonephritis or acute cystitis was hesitant to pursue antibiotics.   Decided to consult with [facility] where patient follows. Spoke with transplant coordinator there who relayed information to one of the physicians. They will call in a prescription for ciprofloxacin for patient. Patient with f/u in clinic there 12/22.   [place] Resident 18 January 2019

## 2019-03-03 IMAGING — US US EXTREM  UP VENOUS*R*
1 series · 12 of 24 positions shown · non-contrast
Comparison: None.

CLINICAL DATA: Right upper extremity pain and edema. History of
thrombosed dialysis fistula within the upper arm.



[Series 1: us extrem up venous*right* · 50 acquisitions, 12 frames shown]
[im 3/50]
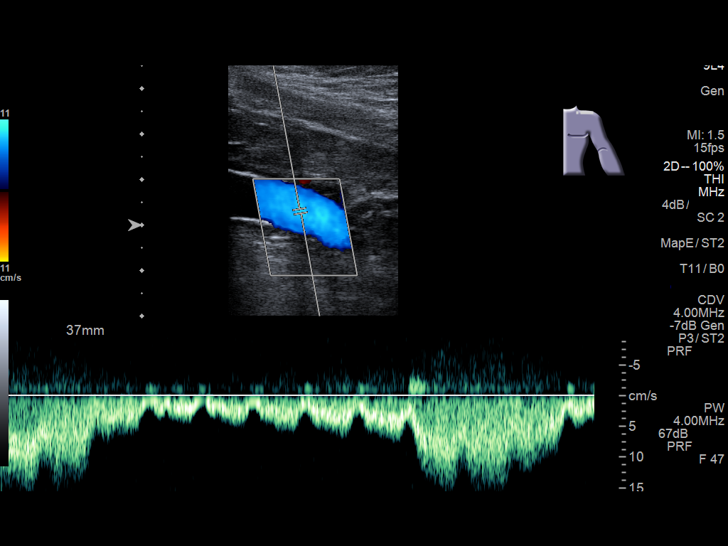
[im 7/50]
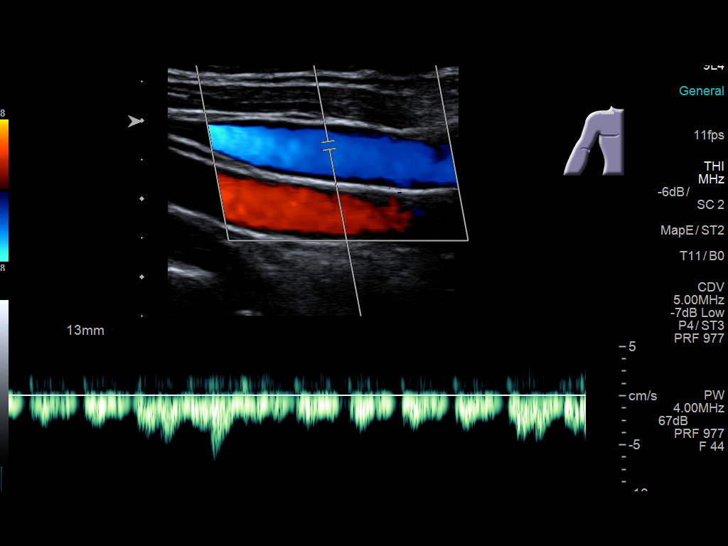
[im 11/50]
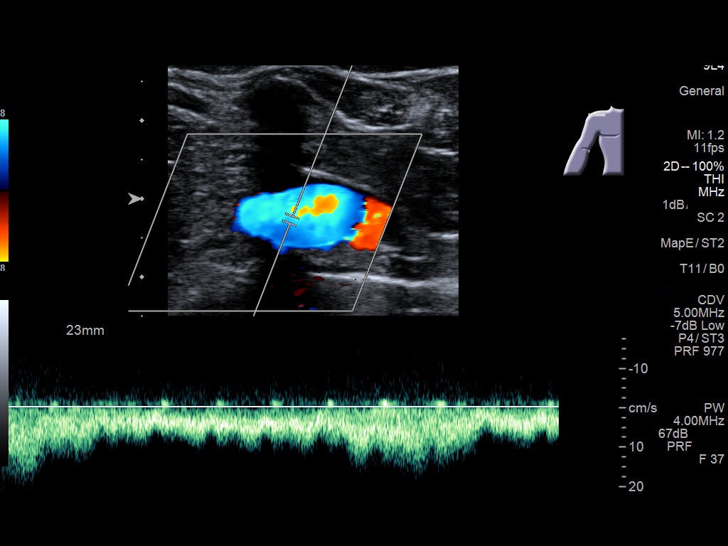
[im 15/50]
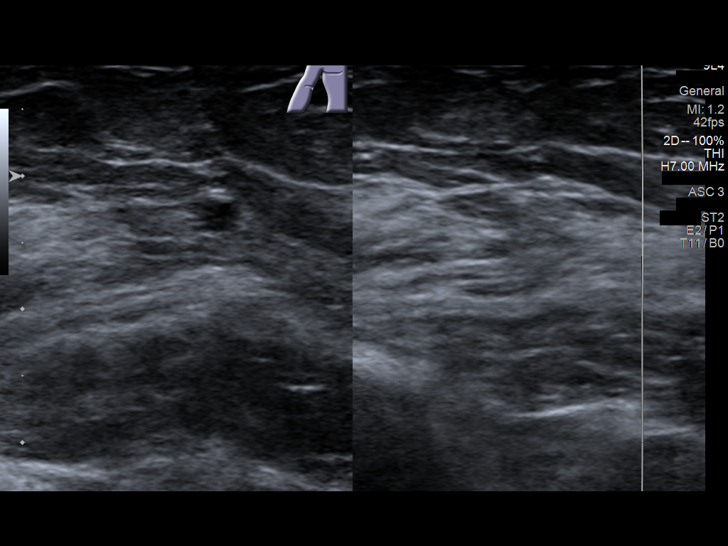
[im 20/50]
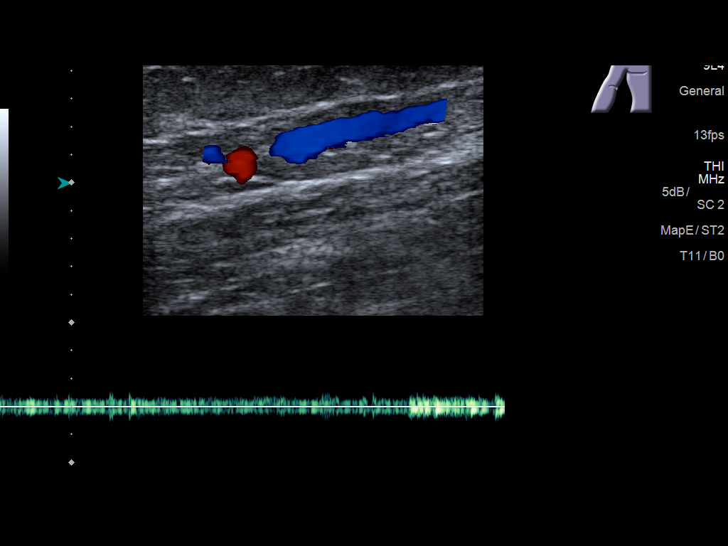
[im 24/50]
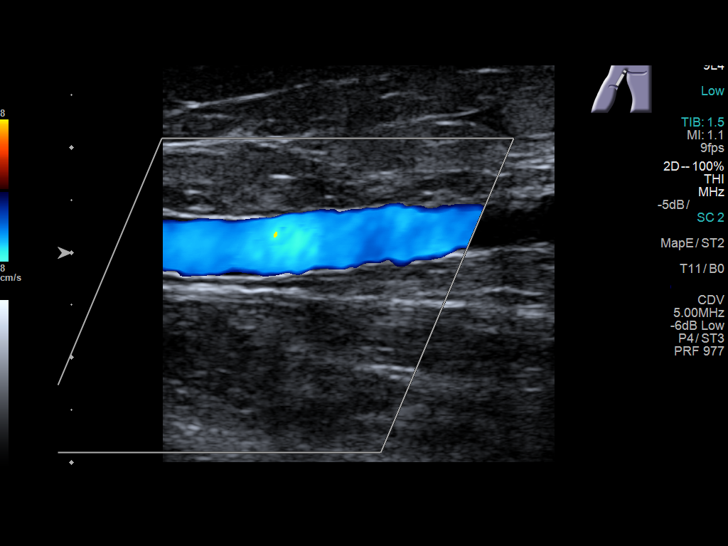
[im 30/50]
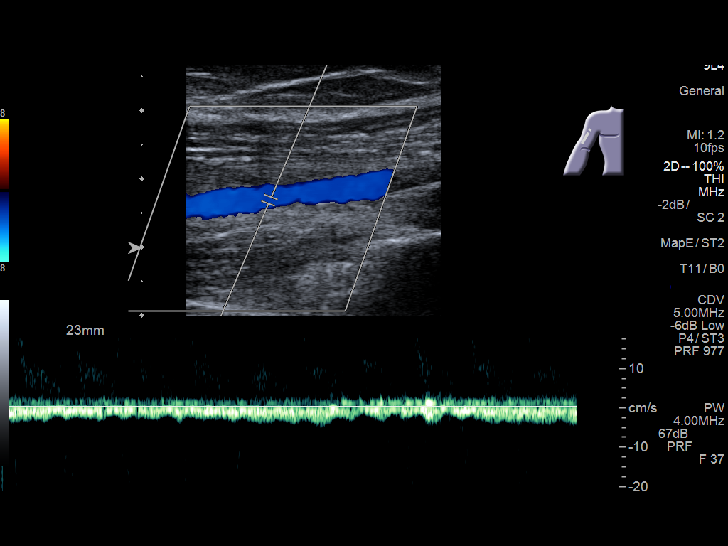
[im 35/50]
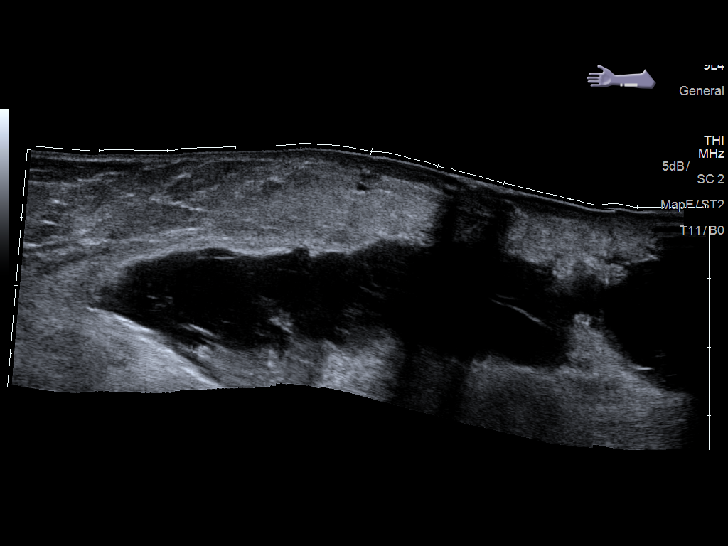
[im 39/50]
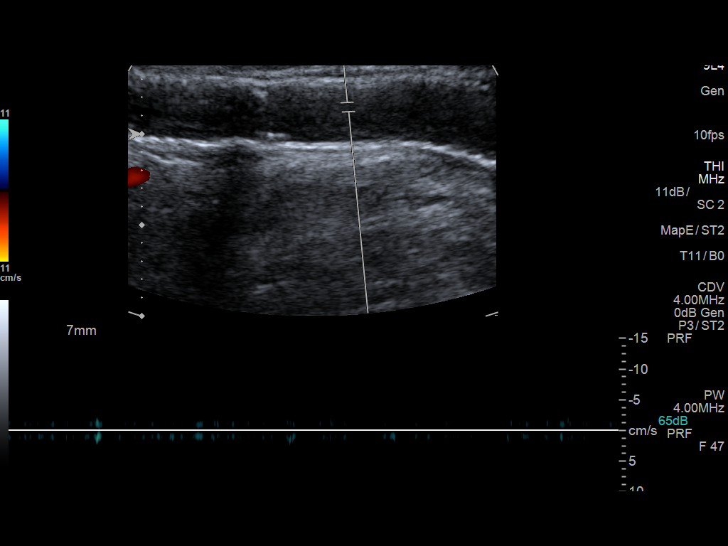
[im 43/50]
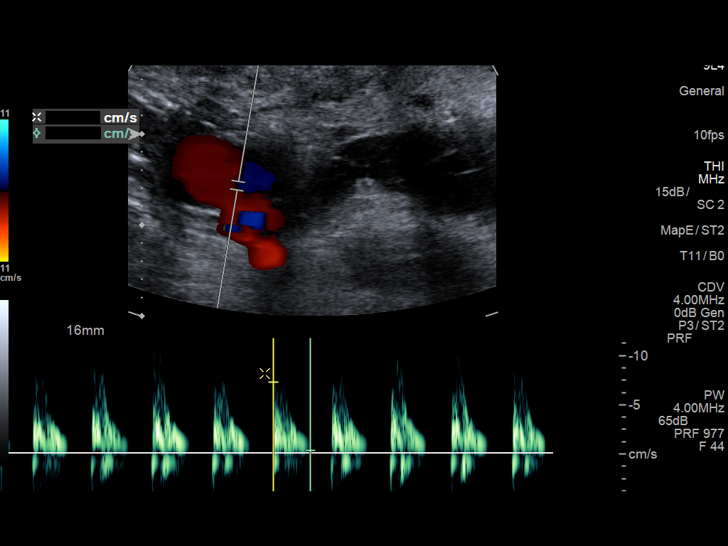
[im 47/50]
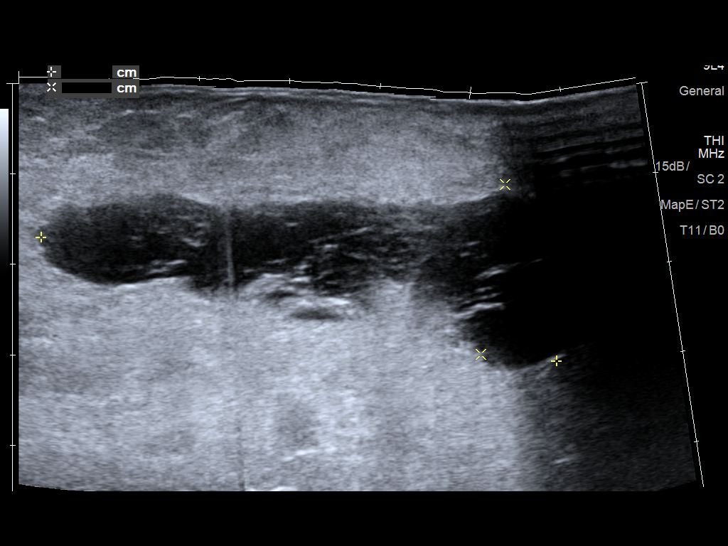
[im 50/50]
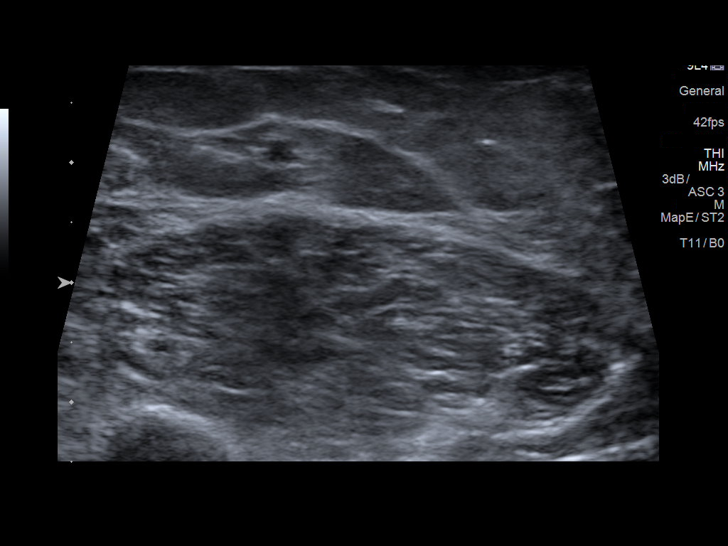

[12 of 24 positions shown; findings below may reference images not displayed]

FINDINGS: Contralateral Subclavian Vein: Respiratory phasicity is normal and
symmetric with the symptomatic side. No evidence of thrombus. Normal
compressibility.

Internal Jugular Vein: No evidence of thrombus. Normal
compressibility, respiratory phasicity and response to augmentation.

Subclavian Vein: No evidence of thrombus. Normal compressibility,
respiratory phasicity and response to augmentation.

Axillary Vein: No evidence of thrombus. Normal compressibility,
respiratory phasicity and response to augmentation.

Cephalic Vein: No evidence of thrombus. Normal compressibility,
respiratory phasicity and response to augmentation.

Basilic Vein: No evidence of thrombus. Normal compressibility,
respiratory phasicity and response to augmentation.

Brachial Veins: No evidence of thrombus. Normal compressibility,
respiratory phasicity and response to augmentation.

Radial Veins: No evidence of thrombus. Normal compressibility,
respiratory phasicity and response to augmentation.

Ulnar Veins: No evidence of thrombus. Normal compressibility,
respiratory phasicity and response to augmentation.

Venous Reflux:  None visualized.

Other Findings: Dialysis graft/fistula at the level of the upper arm
appears occluded (images 25 and 26) compatible with provided
history. There is an additional age-indeterminate approximately
x 1.9 x 2.3 cm mixed echogenic fluid collection adjacent to the
presumed abandon right upper arm dialysis graft/fistula.
Subcutaneous edema is seen at the level of the right upper arm.

Within the antecubital fossa is a hypoechoic minimally complex fluid
collection cough potentially hematoma. While exact measurements are
difficult, the collection appears to measure at least 9 x 1.6 cm
(image 43).

By report, patient underwent creation of a new brachial-basilic
fistula on 06/28/2017 however this is incompletely evaluated on the
present examination (representative image 3).
IMPRESSION: 1. No evidence of DVT within the right upper extremity.
2. Occluded right upper arm dialysis graft/fistula compatible with
provided history, with associated adjacent age-indeterminate
approximately 5.8 cm mixed echogenic fluid collection. Clinical
correlation is advised.
3. Indeterminate at least 9 cm minimally complex fluid collection
with the antecubital fossa, nonspecific though potentially
representative of a hematoma. Clinical correlation is advised.
4. Incomplete evaluation of reported new brachial-basilic fistula.
Further evaluation with diagnostic fistulagram could be performed as
clinically indicated.

## 2019-07-02 DEATH — deceased

## 2020-02-24 IMAGING — CR DG CHEST 2V
1 series · 2 of 2 positions shown · non-contrast
Comparison: Chest x-ray dated March 01, 2013.

CLINICAL DATA: Chest pain and shortness of breath after dialysis
today.

EXAM:
CHEST - 2 VIEW

[Series 1: dg chest 2 view · 0.14mm/px · 2 of 2 slices shown]
[im 1/2]
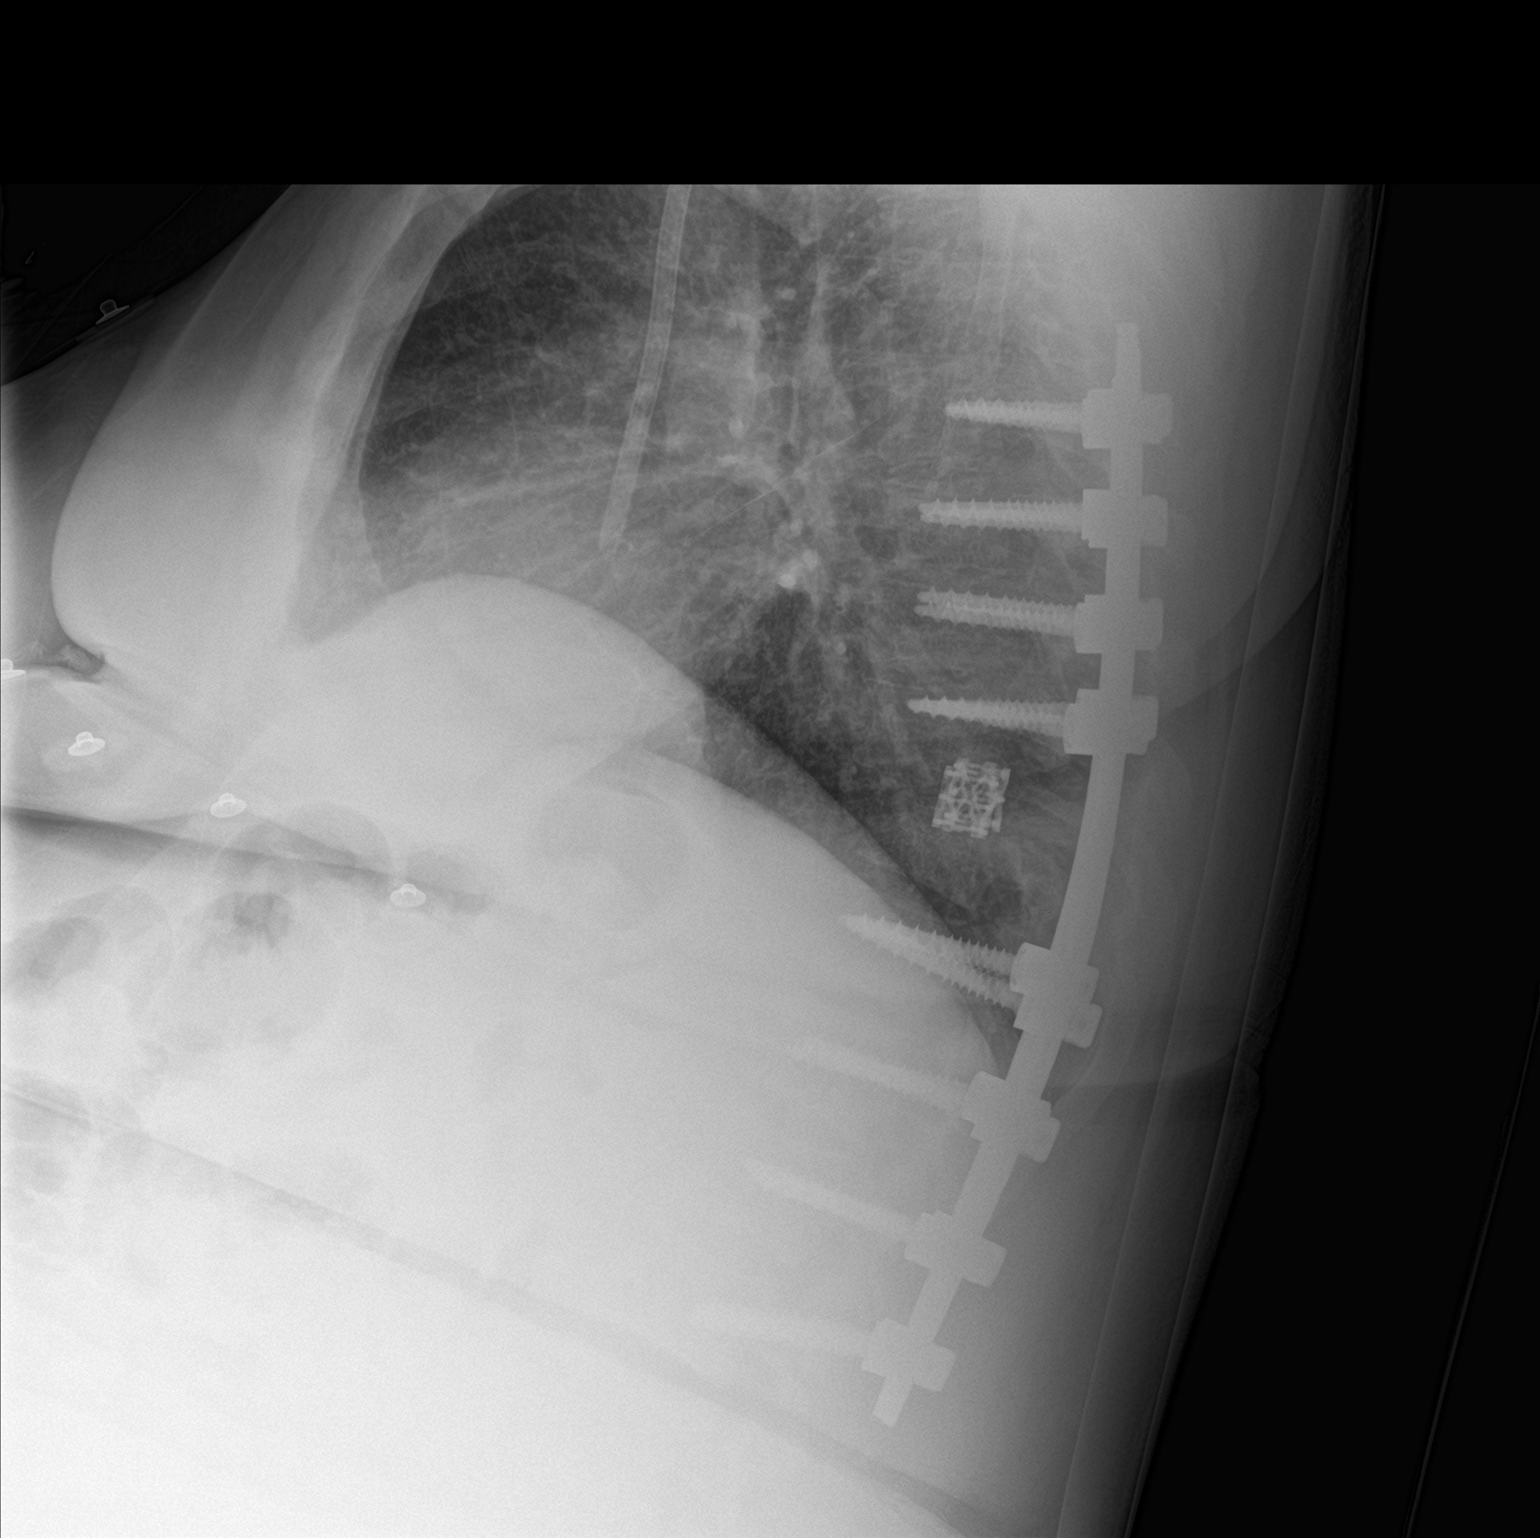
[im 2/2]
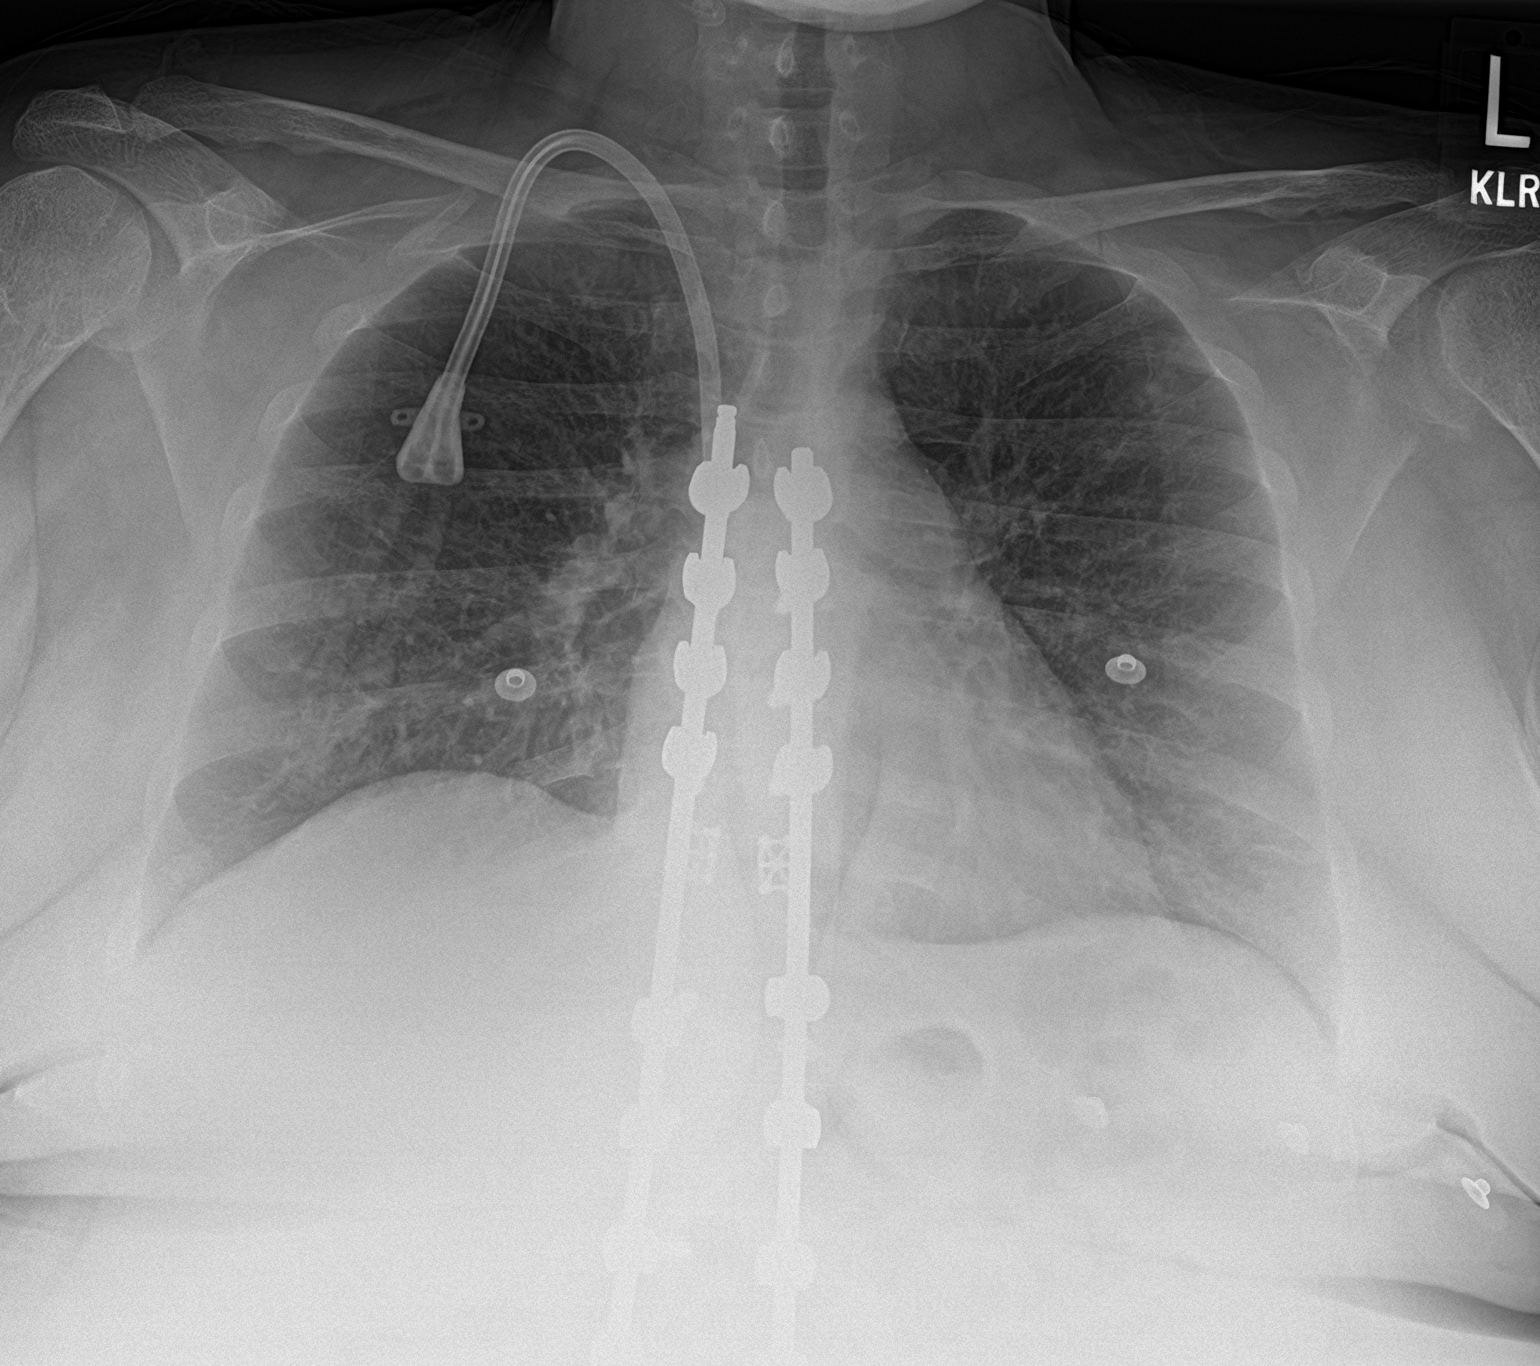

[2 of 2 positions shown; findings below may reference images not displayed]

FINDINGS: Right internal jugular tunneled dialysis catheter with tip in the
proximal right atrium. The heart size and mediastinal contours are
within normal limits. Normal pulmonary vascularity. No focal
consolidation, pleural effusion, or pneumothorax. No acute osseous
abnormality. Prior thoracolumbar fusion.
IMPRESSION: No active cardiopulmonary disease.

## 2021-10-08 IMAGING — CT CT RENAL STONE PROTOCOL
3 of 5 series · 6 of 46 positions shown, 11 images · non-contrast
Comparison: May 08, 2015

CLINICAL DATA: Right flank pain

EXAM:
CT ABDOMEN AND PELVIS WITHOUT CONTRAST
TECHNIQUE: Multidetector CT imaging of the abdomen and pelvis was performed
following the standard protocol without oral or IV contrast.

[Series 4: lung bases · axial · 0.75mm/px · z∈[-516,-476]mm · 2 of 26 slices shown, 5 images]
[im 9/26  soft-tissue]
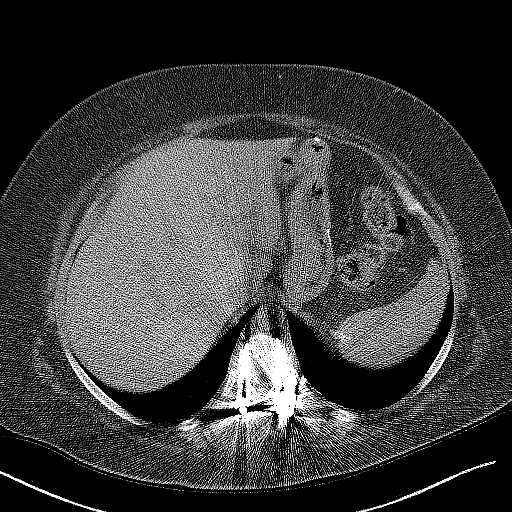
[im 9/26  lung]
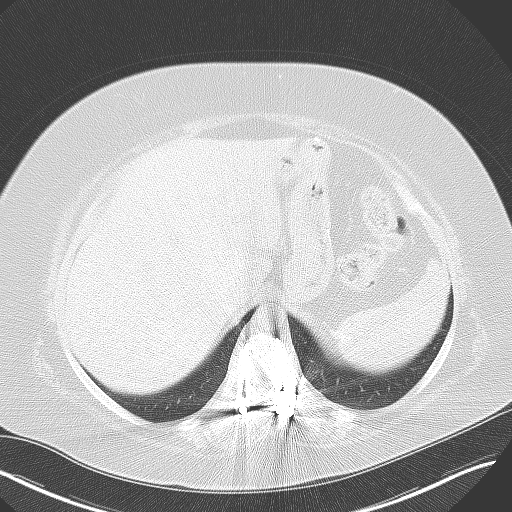
[im 9/26  bone]
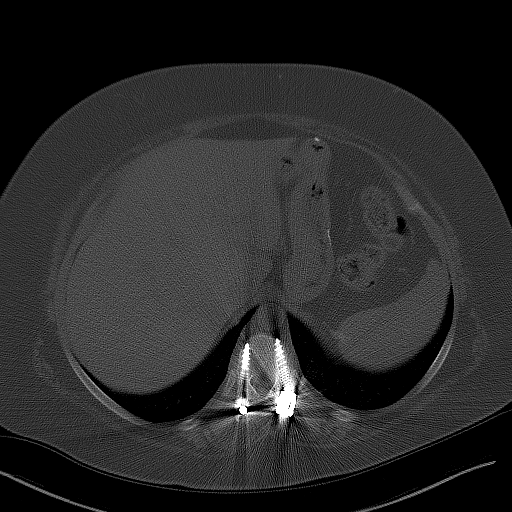
[im 17/26  soft-tissue]
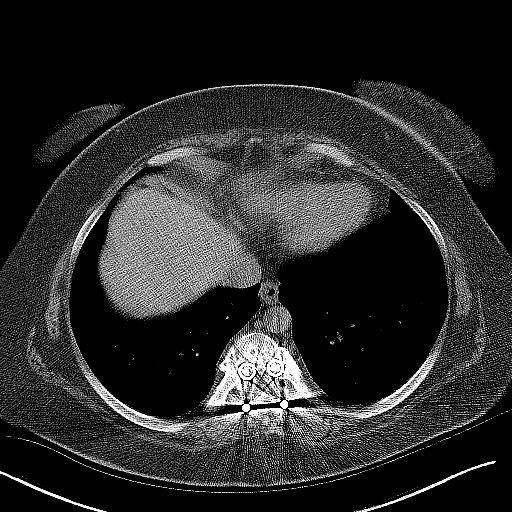
[im 17/26  lung]
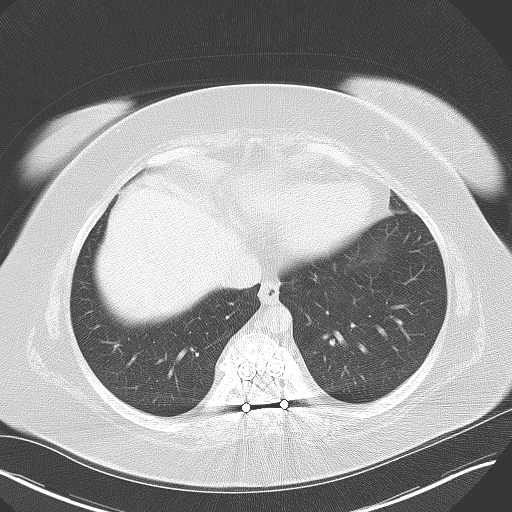

[Series 5: coronal · coronal · 0.77mm/px · 3 of 171 slices shown, 4 images]
[im 57/171  soft-tissue]
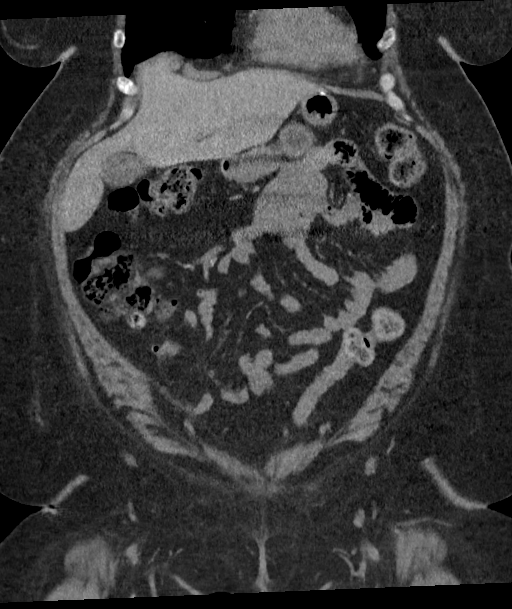
[im 76/171  soft-tissue]
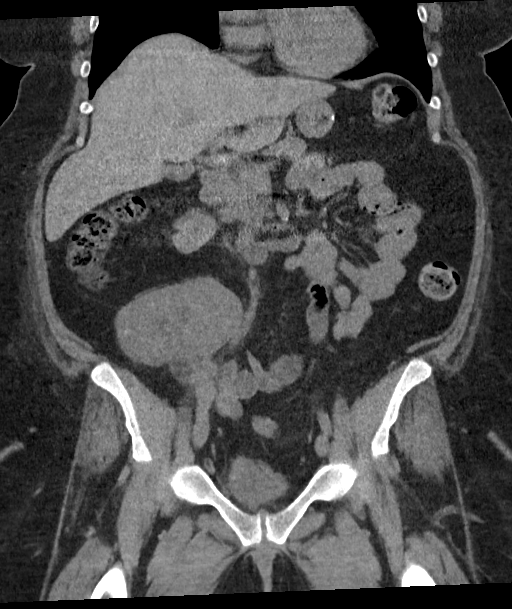
[im 76/171  bone]
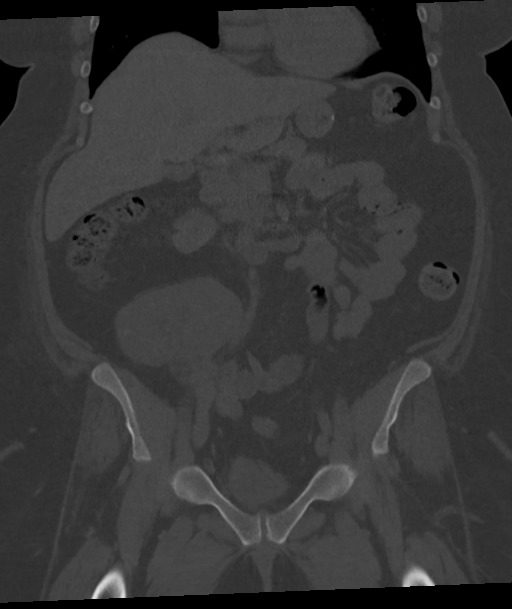
[im 95/171  soft-tissue]
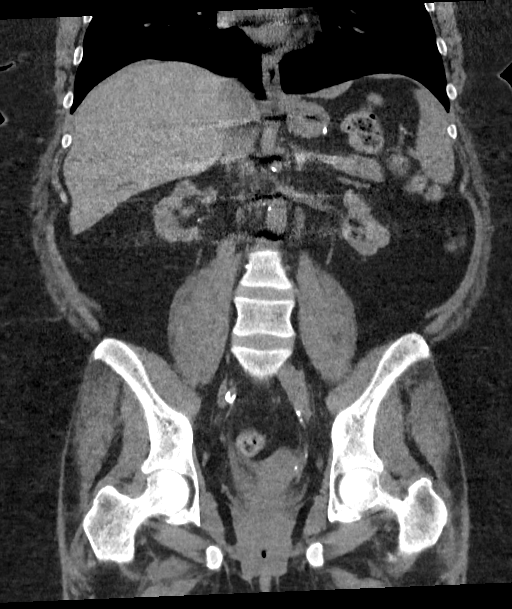

[Series 6: sagittal · sagittal · 0.67mm/px · 1 of 254 slices shown, 2 images]
[im 85/254  soft-tissue]
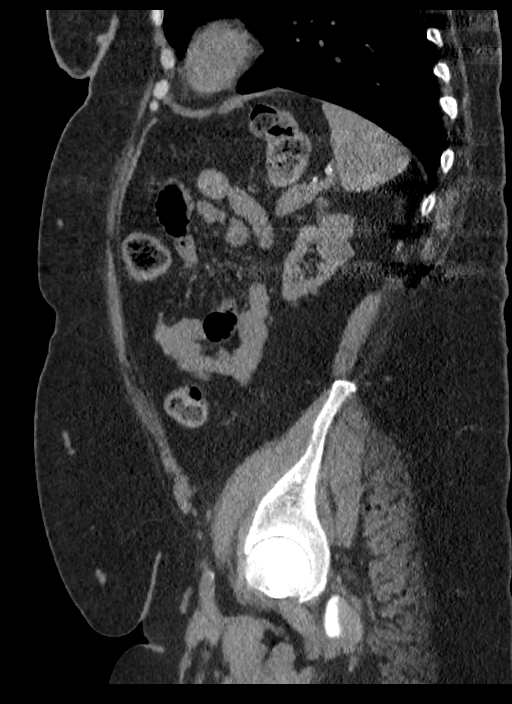
[im 85/254  bone]
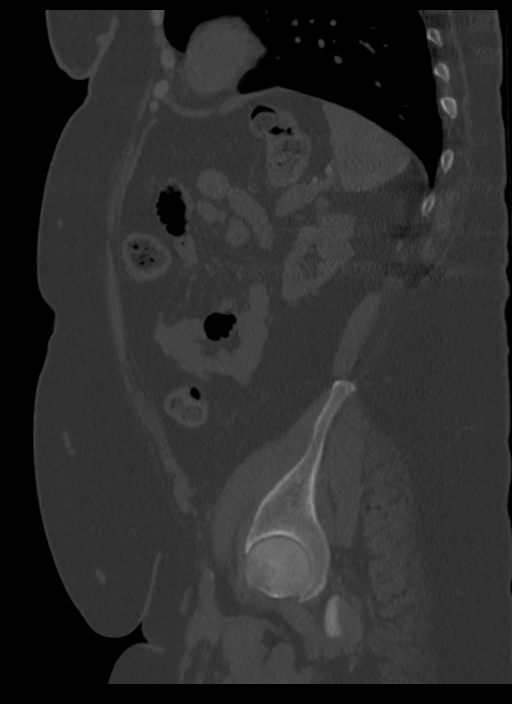

[6 of 46 positions shown; findings below may reference images not displayed]

FINDINGS: Lower chest: There is slight scarring in each anterior lung base.
Lung bases otherwise are clear.

Hepatobiliary: No focal liver lesions are appreciable on this
noncontrast enhanced study. Gallbladder wall is not appreciably
thickened. There is no appreciable biliary duct dilatation.

Pancreas: There is no pancreatic mass or inflammatory focus.

Spleen: No splenic lesions are appreciable.

Adrenals/Urinary Tract: Adrenals bilaterally appear normal. Native
kidneys there are multiple cysts in each kidney. Largest cyst is in
the lower pole of the right native kidney measuring 2.3 x 2.1 cm.
There is an apparent hyperdense cyst in the posterior mid right
native kidney with a Hounsfield unit value of 85, consistent with
hyperdense cyst. There is mild hydronephrosis of the native right
kidney. No hydronephrosis involving the native left kidney. There is
no native kidney renal or ureteral calculus on each side. There is
an extrarenal pelvis in the native right kidney. There is a
transplant kidney in the right lower quadrant without demonstrable
mass or hydronephrosis. There is no appreciable renal or ureteral
calculus involving the transplant kidney. The urinary bladder is
midline with thickening of the urinary bladder wall.

Stomach/Bowel: Patient is status post gastric bypass procedure. No
wall thickening or fluid in the postoperative areas. There is no
evident bowel obstruction. Terminal ileum appears unremarkable.
There is no appreciable free air or portal venous air.

Vascular/Lymphatic: There is no abdominal aortic aneurysm. There are
foci of aortic and iliac artery atherosclerotic calcification. No
adenopathy is evident in the abdomen or pelvis.

Reproductive: Uterus is anteverted.  No pelvic mass evident.

Other: Appendix appears normal. There is a fluid collection in the
anterior right upper pelvic wall region which abuts the anterior
aspect of the rectus muscles. This collection measures 8.0 x 2.5 cm.
Fluid within this collection is serous period there is no air within
this collection. There is mild scarring in the anterior abdominal
wall on the right in the area of this collection.

There is thinning of the rectus muscle in the midline with a small
umbilical hernia containing only fat. No abscess or ascites is
evident in the abdomen or pelvis.

:
Musculoskeletal: There is extensive postoperative change throughout
the lower thoracic and upper lumbar region. There is evidence of a
previous fracture of T10 with remodeling anteriorly. There is
spondylolisthesis at T9-10. There is mild spinal stenosis at L4-5
due to bony hypertrophy and disc protrusion. There are no blastic or
lytic bone lesions. No intramuscular lesions are evident.
IMPRESSION: 1. Mild hydronephrosis involving the native right kidney without
evident renal or ureteral calculus. This finding potentially could
indicate recent calculus passage but also could be indicative of
early changes of pyelonephritis involving the native right kidney.
No abscess or perinephric fluid involving this kidney.

2. No hydronephrosis involving the transplant kidney in the right
lower quadrant. No renal or ureteral calculus involving the
transplant kidney.

3. Urinary bladder wall thickening consistent with a degree of
cystitis.

4. Fluid collection with mildly thickened wall in the right upper
pelvic region. This collection measures 8.0 x 2.5 cm. Suspect seroma
or liquified hematoma as most likely etiology.

4. Status post gastric bypass procedure without complicating
features. No bowel obstruction. Appendix appears normal. No abscess
in the abdomen or pelvis.

5. Thinning of the rectus muscle in the midline. Small umbilical
hernia containing only fat.

6. Extensive postoperative change in the lower thoracic and lumbar
region. Previous fracture at T10 with remodeling period
spondylolisthesis noted at T9-10 with posterior fixation in the
lower thoracic and upper lumbar region.

7.  Aortic Atherosclerosis ([id number]).

8. Multiple cysts in native kidneys including a hyperdense cyst in
the right kidney.
